# Patient Record
Sex: Male | Born: 1940 | Race: Black or African American | Hispanic: No | Marital: Married | Smoking: Former smoker
Health system: Southern US, Community
[De-identification: ages and names within clinical notes are randomized; demographics above are authoritative.]

## PROBLEM LIST (undated history)

## (undated) DIAGNOSIS — R269 Unspecified abnormalities of gait and mobility: Secondary | ICD-10-CM

## (undated) DIAGNOSIS — E559 Vitamin D deficiency, unspecified: Secondary | ICD-10-CM

## (undated) DIAGNOSIS — T8859XA Other complications of anesthesia, initial encounter: Secondary | ICD-10-CM

## (undated) DIAGNOSIS — N400 Enlarged prostate without lower urinary tract symptoms: Secondary | ICD-10-CM

## (undated) DIAGNOSIS — I480 Paroxysmal atrial fibrillation: Secondary | ICD-10-CM

## (undated) DIAGNOSIS — I10 Essential (primary) hypertension: Secondary | ICD-10-CM

## (undated) DIAGNOSIS — E785 Hyperlipidemia, unspecified: Secondary | ICD-10-CM

## (undated) DIAGNOSIS — Z992 Dependence on renal dialysis: Secondary | ICD-10-CM

## (undated) DIAGNOSIS — R7301 Impaired fasting glucose: Secondary | ICD-10-CM

## (undated) DIAGNOSIS — C61 Malignant neoplasm of prostate: Secondary | ICD-10-CM

## (undated) DIAGNOSIS — K219 Gastro-esophageal reflux disease without esophagitis: Secondary | ICD-10-CM

## (undated) DIAGNOSIS — J309 Allergic rhinitis, unspecified: Secondary | ICD-10-CM

## (undated) DIAGNOSIS — I4891 Unspecified atrial fibrillation: Secondary | ICD-10-CM

## (undated) DIAGNOSIS — E669 Obesity, unspecified: Secondary | ICD-10-CM

## (undated) DIAGNOSIS — H3530 Unspecified macular degeneration: Secondary | ICD-10-CM

## (undated) DIAGNOSIS — H409 Unspecified glaucoma: Secondary | ICD-10-CM

## (undated) DIAGNOSIS — N189 Chronic kidney disease, unspecified: Secondary | ICD-10-CM

## (undated) DIAGNOSIS — I77819 Aortic ectasia, unspecified site: Secondary | ICD-10-CM

## (undated) DIAGNOSIS — E1165 Type 2 diabetes mellitus with hyperglycemia: Secondary | ICD-10-CM

## (undated) DIAGNOSIS — N19 Unspecified kidney failure: Secondary | ICD-10-CM

## (undated) DIAGNOSIS — Z9989 Dependence on other enabling machines and devices: Secondary | ICD-10-CM

## (undated) DIAGNOSIS — H547 Unspecified visual loss: Secondary | ICD-10-CM

## (undated) DIAGNOSIS — I509 Heart failure, unspecified: Secondary | ICD-10-CM

## (undated) DIAGNOSIS — D649 Anemia, unspecified: Secondary | ICD-10-CM

## (undated) DIAGNOSIS — R972 Elevated prostate specific antigen [PSA]: Secondary | ICD-10-CM

## (undated) DIAGNOSIS — M109 Gout, unspecified: Secondary | ICD-10-CM

## (undated) DIAGNOSIS — E119 Type 2 diabetes mellitus without complications: Secondary | ICD-10-CM

## (undated) HISTORY — DX: Obesity, unspecified: E66.9

## (undated) HISTORY — DX: Unspecified visual loss: H54.7

## (undated) HISTORY — DX: Benign prostatic hyperplasia without lower urinary tract symptoms: N40.0

## (undated) HISTORY — DX: Gout, unspecified: M10.9

## (undated) HISTORY — DX: Impaired fasting glucose: R73.01

## (undated) HISTORY — DX: Hyperlipidemia, unspecified: E78.5

## (undated) HISTORY — DX: Type 2 diabetes mellitus with hyperglycemia: E11.65

## (undated) HISTORY — DX: Malignant neoplasm of prostate: C61

## (undated) HISTORY — DX: Allergic rhinitis, unspecified: J30.9

## (undated) HISTORY — DX: Aortic ectasia, unspecified site: I77.819

## (undated) HISTORY — DX: Heart failure, unspecified: I50.9

## (undated) HISTORY — DX: Vitamin D deficiency, unspecified: E55.9

## (undated) HISTORY — DX: Elevated prostate specific antigen (PSA): R97.20

## (undated) HISTORY — DX: Dependence on renal dialysis: Z99.2

## (undated) HISTORY — DX: Paroxysmal atrial fibrillation: I48.0

## (undated) HISTORY — DX: Gastro-esophageal reflux disease without esophagitis: K21.9

## (undated) HISTORY — DX: Unspecified abnormalities of gait and mobility: R26.9

## (undated) HISTORY — PX: EYE SURGERY: SHX253

---

## 2004-06-08 ENCOUNTER — Encounter: Admission: RE | Admit: 2004-06-08 | Discharge: 2004-06-08 | Payer: Self-pay | Admitting: Urology

## 2008-02-06 ENCOUNTER — Emergency Department (HOSPITAL_COMMUNITY): Admission: EM | Admit: 2008-02-06 | Discharge: 2008-02-06 | Payer: Self-pay | Admitting: Emergency Medicine

## 2008-06-18 ENCOUNTER — Encounter: Admission: RE | Admit: 2008-06-18 | Discharge: 2008-09-16 | Payer: Self-pay | Admitting: Ophthalmology

## 2010-03-21 ENCOUNTER — Encounter: Payer: Self-pay | Admitting: Urology

## 2010-12-03 LAB — DIFFERENTIAL
Basophils Relative: 0 % (ref 0–1)
Eosinophils Absolute: 0 10*3/uL (ref 0.0–0.7)
Eosinophils Relative: 0 % (ref 0–5)
Monocytes Relative: 6 % (ref 3–12)
Neutro Abs: 8.8 10*3/uL — ABNORMAL HIGH (ref 1.7–7.7)

## 2010-12-03 LAB — URINALYSIS, ROUTINE W REFLEX MICROSCOPIC
Protein, ur: 100 mg/dL — AB
Urobilinogen, UA: 1 mg/dL (ref 0.0–1.0)

## 2010-12-03 LAB — BASIC METABOLIC PANEL
CO2: 21 mEq/L (ref 19–32)
Calcium: 9.1 mg/dL (ref 8.4–10.5)
Chloride: 101 mEq/L (ref 96–112)
GFR calc Af Amer: >60 mL/min (ref >60)
GFR calc non Af Amer: >60 mL/min (ref >60)
Sodium: 133 mEq/L — ABNORMAL LOW (ref 135–145)

## 2010-12-03 LAB — CBC
HCT: 43.9 % (ref 39.0–52.0)
MCV: 90.1 fL (ref 78.0–100.0)
Platelets: 177 10*3/uL (ref 150–400)
RBC: 4.87 MIL/uL (ref 4.22–5.81)

## 2010-12-03 LAB — URINE MICROSCOPIC-ADD ON

## 2011-01-17 DIAGNOSIS — H401133 Primary open-angle glaucoma, bilateral, severe stage: Secondary | ICD-10-CM | POA: Insufficient documentation

## 2011-01-17 DIAGNOSIS — H2511 Age-related nuclear cataract, right eye: Secondary | ICD-10-CM | POA: Insufficient documentation

## 2011-03-10 DIAGNOSIS — Z961 Presence of intraocular lens: Secondary | ICD-10-CM | POA: Insufficient documentation

## 2011-03-10 DIAGNOSIS — H31012 Macula scars of posterior pole (postinflammatory) (post-traumatic), left eye: Secondary | ICD-10-CM | POA: Insufficient documentation

## 2011-03-10 DIAGNOSIS — H3533 Angioid streaks of macula: Secondary | ICD-10-CM | POA: Insufficient documentation

## 2012-04-11 DIAGNOSIS — C61 Malignant neoplasm of prostate: Secondary | ICD-10-CM | POA: Insufficient documentation

## 2013-05-13 DIAGNOSIS — N138 Other obstructive and reflux uropathy: Secondary | ICD-10-CM | POA: Insufficient documentation

## 2013-05-13 DIAGNOSIS — N401 Enlarged prostate with lower urinary tract symptoms: Secondary | ICD-10-CM

## 2016-05-26 DIAGNOSIS — E119 Type 2 diabetes mellitus without complications: Secondary | ICD-10-CM | POA: Insufficient documentation

## 2017-01-04 ENCOUNTER — Other Ambulatory Visit: Payer: Self-pay

## 2017-01-04 MED ORDER — DUTASTERIDE 0.5 MG PO CAPS: 0.5000 mg | ORAL_CAPSULE | Freq: Every day | ORAL | 0 refills | Status: DC

## 2017-01-04 NOTE — Progress Notes (Signed)
Pt called requesting more advodart until f/u appt with Dr. Bernardo Heater at the end of Nov. 30 days and no refills were given.

## 2017-01-18 ENCOUNTER — Other Ambulatory Visit: Payer: Self-pay

## 2017-01-18 ENCOUNTER — Ambulatory Visit (INDEPENDENT_AMBULATORY_CARE_PROVIDER_SITE_OTHER): Payer: Medicare Other | Admitting: Urology

## 2017-01-18 ENCOUNTER — Encounter: Payer: Self-pay | Admitting: Urology

## 2017-01-18 VITALS — BP 178/134 | HR 71 | Ht 78.0 in | Wt 320.5 lb

## 2017-01-18 DIAGNOSIS — N138 Other obstructive and reflux uropathy: Secondary | ICD-10-CM

## 2017-01-18 DIAGNOSIS — N401 Enlarged prostate with lower urinary tract symptoms: Secondary | ICD-10-CM

## 2017-01-18 DIAGNOSIS — C61 Malignant neoplasm of prostate: Secondary | ICD-10-CM | POA: Diagnosis not present

## 2017-01-18 NOTE — Progress Notes (Signed)
01/18/2017 11:48 AM   Austin Wheeler 27-Apr-1940 [id number]  Referring provider: No referring provider defined for this encounter.  Chief Complaint  Patient presents with  . Prostate Cancer  . Follow-up    HPI: 76 year old male presents for follow-up of prostate cancer and BPH.  I last saw him at [facility] in April 2018.  Prostate biopsy was performed at [facility] in [place] in April 2006 for a PSA of 6.5.  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  Prostate volume was 150 cc.  He could not decide on treatment options and elected active surveillance.  Repeat prostate biopsy November 2010 showed a volume of 198 cc and no cancer.  A prostate MRI performed September 2017 showed a 1.4 cm PIRADS 4 lesion in the transition zone in the right mid gland.  No adenopathy or evidence of extracapsular extension was identified.  He elected to continue active surveillance.  PSA April 2018 was stable at 5.52.  He remains on Avodart every other day and has no bothersome lower urinary tract symptoms.  Denies dysuria or gross hematuria.  Denies flank, abdominal, pelvic or scrotal pain.  He recently saw Dr. Inda Merlin in [place] and his PSA was 4.5.  He states a DRE was also performed that was benign.  He states he called to have those records faxed to the office.   PMH: History reviewed. No pertinent past medical history.  Surgical History: History reviewed. No pertinent surgical history.  Home Medications:  Allergies as of 01/18/2017      Reactions   Guaifenesin Rash, Swelling      Medication List        Accurate as of 01/18/17 11:48 AM. Always use your most recent med list.          acetaZOLAMIDE 250 MG tablet Commonly known as:  DIAMOX Take 250 mg by mouth.   amLODipine 10 MG tablet Commonly known as:  NORVASC Take 10 mg by mouth.   aspirin-acetaminophen-caffeine 761-950-93 MG tablet Commonly known as:  EXCEDRIN  MIGRAINE Take by mouth.   BENICAR HCT 40-12.5 MG tablet Generic drug:  olmesartan-hydrochlorothiazide   brimonidine 0.15 % ophthalmic solution Commonly known as:  ALPHAGAN Place 1 drop into both eyes 3 times daily.   dorzolamide-timolol 22.3-6.8 MG/ML ophthalmic solution Commonly known as:  COSOPT PLACE ON DROP INTO BOTH EYES TWO TIMES DAILY   doxazosin 8 MG tablet Commonly known as:  CARDURA   dutasteride 0.5 MG capsule Commonly known as:  AVODART Take 1 capsule (0.5 mg total) daily by mouth.   FOLIC ACID PO Take by mouth.   Garlic Oil 2 MG Caps Take by mouth.   indomethacin 50 MG capsule Commonly known as:  INDOCIN   latanoprost 0.005 % ophthalmic solution Commonly known as:  XALATAN INSTILL ONE DROP INTO BOTH EYES NIGHTLY   metFORMIN 500 MG tablet Commonly known as:  GLUCOPHAGE Take 500 mg by mouth.   metoprolol succinate 50 MG 24 hr tablet Commonly known as:  TOPROL-XL Take 50 mg by mouth.   metoprolol tartrate 25 MG tablet Commonly known as:  LOPRESSOR Take 25 mg by mouth.   metoprolol tartrate 50 MG tablet Commonly known as:  LOPRESSOR   pilocarpine 2 % ophthalmic solution Commonly known as:  PILOCAR Frequency:QID   Dosage:0.0     Instructions:  Note:Dose: 2 %   PRESERVISION/LUTEIN Caps Take by mouth.   valsartan-hydrochlorothiazide 320-12.5 MG tablet Commonly known  as:  DIOVAN-HCT Take by mouth.       Allergies:  Allergies  Allergen Reactions  . Guaifenesin Rash and Swelling    Family History: History reviewed. No pertinent family history.  Social History:  has no tobacco, alcohol, and drug history on file.  ROS: UROLOGY Frequent Urination?: No Hard to postpone urination?: No Burning/pain with urination?: No Get up at night to urinate?: No Leakage of urine?: No Urine stream starts and stops?: No Trouble starting stream?: No Do you have to strain to urinate?: No Blood in urine?: No Urinary tract infection?: No Sexually  transmitted disease?: No Injury to kidneys or bladder?: No Painful intercourse?: No Weak stream?: No Erection problems?: No Penile pain?: No  Gastrointestinal Nausea?: No Vomiting?: No Indigestion/heartburn?: No Diarrhea?: No Constipation?: No  Constitutional Fever: No Night sweats?: No Weight loss?: No Fatigue?: No  Skin Skin rash/lesions?: No Itching?: No  Eyes Blurred vision?: No Double vision?: No  Ears/Nose/Throat Sore throat?: No Sinus problems?: No  Hematologic/Lymphatic Swollen glands?: No Easy bruising?: No  Cardiovascular Leg swelling?: No Chest pain?: No  Respiratory Cough?: No Shortness of breath?: No  Endocrine Excessive thirst?: No  Musculoskeletal Back pain?: Yes Joint pain?: No  Neurological Headaches?: No Dizziness?: No  Psychologic Depression?: No Anxiety?: No  Physical Exam: BP (!) 178/134 (BP Location: Right Arm, Patient Position: Sitting, Cuff Size: Large)   Pulse 71   Ht 6\' 6"  (1.981 m)   Wt (!) 320 lb 8 oz (145.4 kg)   BMI 37.04 kg/m   Constitutional:  Alert and oriented, No acute distress. HEENT: [place] AT, moist mucus membranes.  Trachea midline, no masses. Cardiovascular: No clubbing, cyanosis, or edema. Respiratory: Normal respiratory effort, no increased work of breathing. GI: Abdomen is soft, nontender, nondistended, no abdominal masses GU: No CVA tenderness.  Skin: No rashes, bruises or suspicious lesions. Lymph: No cervical or inguinal adenopathy. Neurologic: Grossly intact, no focal deficits, moving all 4 extremities. Psychiatric: Normal mood and affect.  Laboratory Data: Lab Results  Component Value Date   WBC 10.7 (H) 02/06/2008   HGB 14.6 02/06/2008   HCT 43.9 02/06/2008   MCV 90.1 02/06/2008   PLT 177 02/06/2008    Lab Results  Component Value Date   CREATININE 1.02 02/06/2008      Assessment & Plan:    1.  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  Continue  semiannual follow-up.  He desires to continue active surveillance.  2. BPH with obstruction/lower urinary tract symptoms Stable on dutasteride and doxazosin.  - Urinalysis, Complete   Return in about 6 months (around 07/18/2017) for Recheck, PSA.    [name], [place] [address] [place],  [address] [phone]

## 2017-03-17 ENCOUNTER — Telehealth: Payer: Self-pay | Admitting: Radiology

## 2017-03-17 ENCOUNTER — Other Ambulatory Visit: Payer: Self-pay | Admitting: Radiology

## 2017-03-17 DIAGNOSIS — N401 Enlarged prostate with lower urinary tract symptoms: Principal | ICD-10-CM

## 2017-03-17 DIAGNOSIS — N138 Other obstructive and reflux uropathy: Secondary | ICD-10-CM

## 2017-03-17 MED ORDER — DUTASTERIDE 0.5 MG PO CAPS: 0.5000 mg | ORAL_CAPSULE | Freq: Every day | ORAL | 3 refills | Status: DC

## 2017-03-17 NOTE — Telephone Encounter (Signed)
Wife requests refill of dutasteride. Prescription sent to pharmacy. Wife voices understanding.

## 2017-07-17 ENCOUNTER — Encounter: Payer: Self-pay | Admitting: Urology

## 2017-07-17 ENCOUNTER — Ambulatory Visit: Payer: Medicare Other | Admitting: Urology

## 2017-07-17 VITALS — BP 175/112 | HR 79 | Ht 78.0 in | Wt 318.0 lb

## 2017-07-17 DIAGNOSIS — C61 Malignant neoplasm of prostate: Secondary | ICD-10-CM | POA: Diagnosis not present

## 2017-07-17 DIAGNOSIS — N138 Other obstructive and reflux uropathy: Secondary | ICD-10-CM | POA: Diagnosis not present

## 2017-07-17 DIAGNOSIS — N401 Enlarged prostate with lower urinary tract symptoms: Secondary | ICD-10-CM | POA: Diagnosis not present

## 2017-07-17 NOTE — Progress Notes (Signed)
07/17/2017 3:14 PM   Austin Wheeler 11-11-1940 [id number]  Referring provider: Josetta Huddle, MD [place]. [address] [place], [place] [address]  Chief Complaint  Patient presents with  . Follow-up   Urologic problem list: -Prostate cancer, moderate risk on active surveillance Prostate biopsy was performed at [facility] in [place] in April 2006 for a PSA of 6.5.  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  Prostate volume was 150 cc.  He could not decide on treatment options and elected active surveillance.  Repeat prostate biopsy November 2010 showed a volume of 198 cc and no cancer.  A prostate MRI performed September 2017 showed a 1.4 cm PIRADS 4 lesion in the transition zone in the right mid gland.  No adenopathy or evidence of extracapsular extension was identified.  He elected to continue active surveillance.   -BPH with lower urinary tract symptoms; on doxazosin and dutasteride   HPI: 77 year old male presents for follow-up of the above problem list.  He was last seen November 2018.  He states he is doing very well from a voiding standpoint and has no complaints.  He remains on dutasteride 3 times weekly and doxazosin.  And uncorrected PSA performed at his primary providers office in early May was stable at 3.8.  He denies dysuria or gross hematuria.  He has no flank, abdominal, pelvic or scrotal pain.   PMH: History reviewed. No pertinent past medical history.  Surgical History: History reviewed. No pertinent surgical history.  Home Medications:  Allergies as of 07/17/2017      Reactions   Guaifenesin Rash, Swelling      Medication List        Accurate as of 07/17/17  3:14 PM. Always use your most recent med list.          acetaZOLAMIDE 250 MG tablet Commonly known as:  DIAMOX Take 250 mg by mouth.   amLODipine 10 MG tablet Commonly known as:  NORVASC Take 10 mg by mouth.   aspirin EC  81 MG tablet 81 mg daily.   aspirin-acetaminophen-caffeine 726-203-55 MG tablet Commonly known as:  EXCEDRIN MIGRAINE Take by mouth.   BENICAR HCT 40-12.5 MG tablet Generic drug:  olmesartan-hydrochlorothiazide   brimonidine 0.15 % ophthalmic solution Commonly known as:  ALPHAGAN Place 1 drop into both eyes 3 times daily.   ciprofloxacin 0.3 % ophthalmic solution Commonly known as:  CILOXAN   dorzolamide-timolol 22.3-6.8 MG/ML ophthalmic solution Commonly known as:  COSOPT PLACE ON DROP INTO BOTH EYES TWO TIMES DAILY   doxazosin 8 MG tablet Commonly known as:  CARDURA   dutasteride 0.5 MG capsule Commonly known as:  AVODART Take 1 capsule (0.5 mg total) by mouth daily.   FOLIC ACID PO Take by mouth.   Garlic Oil 2 MG Caps Take by mouth.   indomethacin 50 MG capsule Commonly known as:  INDOCIN   latanoprost 0.005 % ophthalmic solution Commonly known as:  XALATAN INSTILL ONE DROP INTO BOTH EYES NIGHTLY   XALATAN 0.005 % ophthalmic solution Generic drug:  latanoprost Place 1 drop into the right eye nightly.   metFORMIN 500 MG tablet Commonly known as:  GLUCOPHAGE Take 500 mg by mouth.   metoprolol succinate 50 MG 24 hr tablet Commonly known as:  TOPROL-XL Take 50 mg by mouth.   metoprolol tartrate 25 MG tablet Commonly known as:  LOPRESSOR Take 25 mg by mouth.   metoprolol tartrate 50  MG tablet Commonly known as:  LOPRESSOR   pilocarpine 2 % ophthalmic solution Commonly known as:  PILOCAR Frequency:QID   Dosage:0.0     Instructions:  Note:Dose: 2 %   pilocarpine 4 % ophthalmic solution Commonly known as:  PILOCAR   CENTRUM ADULTS PO daily. one by mouth once a day (men's ultra formula)   PRESERVISION/LUTEIN Caps Take by mouth.   valsartan-hydrochlorothiazide 320-12.5 MG tablet Commonly known as:  DIOVAN-HCT Take by mouth.   Vitamin D3 2000 units capsule 1 capsule daily.   Vitamin E 400 units Tabs 400 Units daily.       Allergies:    Allergies  Allergen Reactions  . Guaifenesin Rash and Swelling    Family History: History reviewed. No pertinent family history.  Social History:  does not have a smoking history on file. He has never used smokeless tobacco. He reports that he drank alcohol. He reports that he does not use drugs.  ROS: UROLOGY Frequent Urination?: No Hard to postpone urination?: No Burning/pain with urination?: No Get up at night to urinate?: No Leakage of urine?: No Urine stream starts and stops?: No Trouble starting stream?: No Do you have to strain to urinate?: No Blood in urine?: No Urinary tract infection?: No Sexually transmitted disease?: No Injury to kidneys or bladder?: No Painful intercourse?: No Weak stream?: No Erection problems?: No Penile pain?: No  Gastrointestinal Nausea?: No Vomiting?: No Indigestion/heartburn?: No Diarrhea?: No Constipation?: No  Constitutional Fever: No Night sweats?: No Weight loss?: No Fatigue?: No  Skin Skin rash/lesions?: No Itching?: No  Eyes Blurred vision?: No Double vision?: No  Ears/Nose/Throat Sore throat?: No Sinus problems?: No  Hematologic/Lymphatic Swollen glands?: No Easy bruising?: No  Cardiovascular Leg swelling?: No Chest pain?: No  Respiratory Cough?: No Shortness of breath?: No  Endocrine Excessive thirst?: No  Musculoskeletal Back pain?: No Joint pain?: No  Neurological Headaches?: No Dizziness?: No  Psychologic Depression?: No Anxiety?: No  Physical Exam: BP (!) 175/112 (BP Location: Right Arm, Patient Position: Sitting, Cuff Size: Large)   Pulse 79   Ht 6\' 6"  (1.981 m)   Wt (!) 318 lb (144.2 kg)   SpO2 99%   BMI 36.75 kg/m   Constitutional:  Alert and oriented, No acute distress. HEENT: [place] AT, moist mucus membranes.  Trachea midline, no masses. Cardiovascular: No clubbing, cyanosis, or edema. Respiratory: Normal respiratory effort, no increased work of breathing. GI: Abdomen is  soft, nontender, nondistended, no abdominal masses GU: No CVA tenderness.  Prostate 60+ cc, smooth without nodules Lymph: No cervical or inguinal lymphadenopathy. Skin: No rashes, bruises or suspicious lesions. Neurologic: Grossly intact, no focal deficits, moving all 4 extremities. Psychiatric: Normal mood and affect.  Laboratory Data: Lab Results  Component Value Date   WBC 10.7 (H) 02/06/2008   HGB 14.6 02/06/2008   HCT 43.9 02/06/2008   MCV 90.1 02/06/2008   PLT 177 02/06/2008    Lab Results  Component Value Date   CREATININE 1.02 02/06/2008     Assessment & Plan:   77 year old male with moderate risk prostate cancer who has been on surveillance for approximately 13 years.  His PSA is stable.  He has elected to continue active surveillance and recommend a follow-up PSA in 6 months and office visit 1 year.    [name], [place] [address] [place], [place] [address] [phone]

## 2017-07-17 NOTE — Addendum Note (Signed)
Addended by: Tommy Rainwater on: 07/17/2017 04:41 PM   Modules accepted: Orders

## 2017-08-01 DIAGNOSIS — H4051X3 Glaucoma secondary to other eye disorders, right eye, severe stage: Secondary | ICD-10-CM | POA: Insufficient documentation

## 2018-01-15 ENCOUNTER — Other Ambulatory Visit: Payer: Medicare Other

## 2018-01-15 DIAGNOSIS — N401 Enlarged prostate with lower urinary tract symptoms: Principal | ICD-10-CM

## 2018-01-15 DIAGNOSIS — N138 Other obstructive and reflux uropathy: Secondary | ICD-10-CM

## 2018-01-16 ENCOUNTER — Telehealth: Payer: Self-pay

## 2018-01-16 LAB — PSA: PROSTATE SPECIFIC AG, SERUM: 5.8 ng/mL — AB (ref 0.0–4.0)

## 2018-01-16 NOTE — Telephone Encounter (Signed)
-----   Message from [name], MD sent at 01/16/2018  7:27 AM EST ----- PSA stable at 5.8.  Follow-up as scheduled

## 2018-01-16 NOTE — Telephone Encounter (Signed)
Informed patient of PSA results and recommendation.

## 2018-05-04 ENCOUNTER — Other Ambulatory Visit: Payer: Self-pay | Admitting: Urology

## 2018-05-04 DIAGNOSIS — N138 Other obstructive and reflux uropathy: Secondary | ICD-10-CM

## 2018-05-04 DIAGNOSIS — N401 Enlarged prostate with lower urinary tract symptoms: Principal | ICD-10-CM

## 2018-05-04 MED ORDER — DUTASTERIDE 0.5 MG PO CAPS: 0.5000 mg | ORAL_CAPSULE | Freq: Every day | ORAL | 3 refills | Status: DC

## 2018-05-04 NOTE — Telephone Encounter (Signed)
Pt needs refill of Avodart sent to pharmacy.

## 2018-05-04 NOTE — Telephone Encounter (Signed)
Dutasteride refill sent to pharmacy per patient request

## 2018-07-17 ENCOUNTER — Other Ambulatory Visit: Payer: Self-pay

## 2018-07-17 DIAGNOSIS — C61 Malignant neoplasm of prostate: Secondary | ICD-10-CM

## 2018-07-18 ENCOUNTER — Encounter: Payer: Self-pay | Admitting: Urology

## 2018-07-18 ENCOUNTER — Other Ambulatory Visit: Payer: Medicare Other

## 2018-07-30 ENCOUNTER — Ambulatory Visit: Payer: Medicare Other | Admitting: Urology

## 2018-07-30 ENCOUNTER — Encounter: Payer: Self-pay | Admitting: Urology

## 2018-08-08 DIAGNOSIS — H2511 Age-related nuclear cataract, right eye: Secondary | ICD-10-CM | POA: Insufficient documentation

## 2018-08-29 ENCOUNTER — Other Ambulatory Visit: Payer: Self-pay

## 2018-10-24 ENCOUNTER — Other Ambulatory Visit: Payer: Medicare Other

## 2018-11-01 ENCOUNTER — Encounter: Payer: Self-pay | Admitting: Urology

## 2018-11-01 ENCOUNTER — Ambulatory Visit (INDEPENDENT_AMBULATORY_CARE_PROVIDER_SITE_OTHER): Payer: Medicare Other | Admitting: Urology

## 2018-11-01 ENCOUNTER — Other Ambulatory Visit: Payer: Self-pay

## 2018-11-01 VITALS — BP 159/107 | HR 73 | Ht 78.0 in | Wt 299.0 lb

## 2018-11-01 DIAGNOSIS — N138 Other obstructive and reflux uropathy: Secondary | ICD-10-CM

## 2018-11-01 DIAGNOSIS — N401 Enlarged prostate with lower urinary tract symptoms: Secondary | ICD-10-CM | POA: Diagnosis not present

## 2018-11-01 DIAGNOSIS — C61 Malignant neoplasm of prostate: Secondary | ICD-10-CM | POA: Diagnosis not present

## 2018-11-02 ENCOUNTER — Encounter: Payer: Self-pay | Admitting: Urology

## 2018-11-02 MED ORDER — DOXAZOSIN MESYLATE 8 MG PO TABS: 8.0000 mg | ORAL_TABLET | Freq: Every day | ORAL | 3 refills | Status: DC

## 2018-11-02 NOTE — Progress Notes (Signed)
11/01/2018 11:48 AM   Austin Wheeler 07-12-1940 [id number]  Referring provider: Josetta Huddle, MD [address] [place],  [place] [address]  Chief Complaint  Patient presents with  . Follow-up    Urologic problem list: 1. Prostate cancer, moderate risk on active surveillance  - Prostate biopsy was performed at [facility] in [place] in April 2006 for a PSA of 6.5.  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  -  - Prostate volume 150 cc; could not decide on treatment options and elected active surveillance.  -  - Repeat prostate biopsy November 2010 showed a volume of 198 ccand no cancer.    - prostate MRI performed September 2017 showed a 1.4 cm PIRADS 4 lesion in the transition zone in the right mid gland. No adenopathy or evidence of extracapsular extension was identified.  -elected to continue active surveillance.  2. BPH with lower urinary tract symptoms; on doxazosin and dutasteride  HPI: 78 y.o. male presents for annual follow-up.  He has no bothersome lower urinary tract symptoms.  He remains on doxazosin/dutasteride.  Denies dysuria or gross hematuria.  PSA drawn by his PCP June 2020 was stable at 6.66.  IPSS completed today was 2/1.   PMH: No past medical history on file.  Surgical History: No past surgical history on file.  Home Medications:  Allergies as of 11/01/2018      Reactions   Guaifenesin Rash, Swelling      Medication List       Accurate as of November 01, 2018 11:59 PM. If you have any questions, ask your nurse or doctor.        acetaZOLAMIDE 250 MG tablet Commonly known as: DIAMOX Take 250 mg by mouth.   amLODipine 10 MG tablet Commonly known as: NORVASC Take 10 mg by mouth.   aspirin EC 81 MG tablet 81 mg daily.   aspirin-acetaminophen-caffeine 062-694-85 MG tablet Commonly known as: EXCEDRIN MIGRAINE Take by mouth.   Benicar HCT 40-12.5 MG tablet Generic  drug: olmesartan-hydrochlorothiazide   brimonidine 0.15 % ophthalmic solution Commonly known as: ALPHAGAN Place 1 drop into both eyes 3 times daily.   ciprofloxacin 0.3 % ophthalmic solution Commonly known as: CILOXAN   dorzolamide-timolol 22.3-6.8 MG/ML ophthalmic solution Commonly known as: COSOPT PLACE ON DROP INTO BOTH EYES TWO TIMES DAILY   doxazosin 8 MG tablet Commonly known as: CARDURA   dutasteride 0.5 MG capsule Commonly known as: AVODART Take 1 capsule (0.5 mg total) by mouth daily.   FOLIC ACID PO Take by mouth.   Garlic Oil 2 MG Caps Take by mouth.   indomethacin 50 MG capsule Commonly known as: INDOCIN   metFORMIN 500 MG tablet Commonly known as: GLUCOPHAGE Take 500 mg by mouth.   metoprolol succinate 50 MG 24 hr tablet Commonly known as: TOPROL-XL Take 50 mg by mouth.   metoprolol tartrate 25 MG tablet Commonly known as: LOPRESSOR Take 25 mg by mouth.   metoprolol tartrate 50 MG tablet Commonly known as: LOPRESSOR   pilocarpine 2 % ophthalmic solution Commonly known as: PILOCAR Frequency:QID   Dosage:0.0     Instructions:  Note:Dose: 2 %   pilocarpine 4 % ophthalmic solution Commonly known as: PILOCAR   CENTRUM ADULTS PO daily. one by mouth once a day (men's ultra formula)   PreserVision/Lutein Caps Take by mouth.   valsartan-hydrochlorothiazide 320-12.5 MG tablet Commonly known as: DIOVAN-HCT Take by mouth.   Vitamin D3  50 MCG (2000 UT) capsule 1 capsule daily.   Vitamin E 400 units Tabs 400 Units daily.   Xalatan 0.005 % ophthalmic solution Generic drug: latanoprost Place 1 drop into the right eye nightly. What changed: Another medication with the same name was removed. Continue taking this medication, and follow the directions you see here. Changed by: [name], MD       Allergies:  Allergies  Allergen Reactions  . Guaifenesin Rash and Swelling    Family History: No family history on file.  Social History:   reports that he has never smoked. He has never used smokeless tobacco. He reports previous alcohol use. He reports that he does not use drugs.  ROS: UROLOGY Frequent Urination?: No Hard to postpone urination?: No Burning/pain with urination?: No Get up at night to urinate?: Yes Leakage of urine?: No Urine stream starts and stops?: No Trouble starting stream?: No Do you have to strain to urinate?: No Blood in urine?: No Urinary tract infection?: No Sexually transmitted disease?: No Injury to kidneys or bladder?: No Painful intercourse?: No Weak stream?: No Erection problems?: No Penile pain?: No  Gastrointestinal Nausea?: No Vomiting?: No Indigestion/heartburn?: No Diarrhea?: No Constipation?: No  Constitutional Fever: No Night sweats?: No Weight loss?: No Fatigue?: No  Skin Skin rash/lesions?: No Itching?: No  Eyes Blurred vision?: No Double vision?: No  Ears/Nose/Throat Sore throat?: No Sinus problems?: No  Hematologic/Lymphatic Swollen glands?: No Easy bruising?: No  Cardiovascular Leg swelling?: No Chest pain?: No  Respiratory Cough?: No Shortness of breath?: No  Endocrine Excessive thirst?: No  Musculoskeletal Back pain?: No Joint pain?: No  Neurological Headaches?: No Dizziness?: No  Psychologic Depression?: No Anxiety?: No  Physical Exam: BP (!) 159/107   Pulse 73   Ht 6\' 6"  (1.981 m)   Wt 299 lb (135.6 kg)   BMI 34.55 kg/m   Constitutional:  Alert and oriented, No acute distress. HEENT: [place] AT, moist mucus membranes.  Trachea midline, no masses. Cardiovascular: No clubbing, cyanosis, or edema. Respiratory: Normal respiratory effort, no increased work of breathing. GI: Abdomen is soft, nontender, nondistended, no abdominal masses GU: No CVA tenderness.  Prostate 60+ cc, smooth without nodules Lymph: No cervical or inguinal lymphadenopathy. Skin: No rashes, bruises or suspicious lesions. Neurologic: Grossly intact, no focal  deficits, moving all 4 extremities. Psychiatric: Normal mood and affect.   Assessment & Plan:    - Intermediate risk prostate cancer He has elected to continue active surveillance.  PSA stable and DRE benign.  Recommend a 53-month follow-up PSA and 1 year office visit.  - BPH with lower urinary tract symptoms Stable voiding symptoms on dutasteride/doxazosin which were refilled.   [name], [place] [address] [place], [place] [address] [phone]

## 2019-05-01 ENCOUNTER — Other Ambulatory Visit: Payer: Medicare Other

## 2019-05-08 ENCOUNTER — Other Ambulatory Visit: Payer: Self-pay

## 2019-05-08 DIAGNOSIS — C61 Malignant neoplasm of prostate: Secondary | ICD-10-CM

## 2019-05-08 DIAGNOSIS — N138 Other obstructive and reflux uropathy: Secondary | ICD-10-CM

## 2019-05-09 ENCOUNTER — Other Ambulatory Visit: Payer: Self-pay

## 2019-05-09 ENCOUNTER — Other Ambulatory Visit: Payer: Medicare PPO

## 2019-05-09 DIAGNOSIS — C61 Malignant neoplasm of prostate: Secondary | ICD-10-CM

## 2019-05-10 LAB — PSA: Prostate Specific Ag, Serum: 4.5 ng/mL — ABNORMAL HIGH (ref 0.0–4.0)

## 2019-10-08 DIAGNOSIS — E785 Hyperlipidemia, unspecified: Secondary | ICD-10-CM | POA: Diagnosis not present

## 2019-10-08 DIAGNOSIS — E559 Vitamin D deficiency, unspecified: Secondary | ICD-10-CM | POA: Diagnosis not present

## 2019-10-08 DIAGNOSIS — K219 Gastro-esophageal reflux disease without esophagitis: Secondary | ICD-10-CM | POA: Diagnosis not present

## 2019-10-08 DIAGNOSIS — C61 Malignant neoplasm of prostate: Secondary | ICD-10-CM | POA: Diagnosis not present

## 2019-10-08 DIAGNOSIS — Z7984 Long term (current) use of oral hypoglycemic drugs: Secondary | ICD-10-CM | POA: Diagnosis not present

## 2019-10-08 DIAGNOSIS — H4050X0 Glaucoma secondary to other eye disorders, unspecified eye, stage unspecified: Secondary | ICD-10-CM | POA: Diagnosis not present

## 2019-10-08 DIAGNOSIS — L309 Dermatitis, unspecified: Secondary | ICD-10-CM | POA: Diagnosis not present

## 2019-10-08 DIAGNOSIS — H543 Unqualified visual loss, both eyes: Secondary | ICD-10-CM | POA: Diagnosis not present

## 2019-10-11 DIAGNOSIS — I8312 Varicose veins of left lower extremity with inflammation: Secondary | ICD-10-CM | POA: Diagnosis not present

## 2019-10-11 DIAGNOSIS — I872 Venous insufficiency (chronic) (peripheral): Secondary | ICD-10-CM | POA: Diagnosis not present

## 2019-10-11 DIAGNOSIS — I8311 Varicose veins of right lower extremity with inflammation: Secondary | ICD-10-CM | POA: Diagnosis not present

## 2019-11-14 ENCOUNTER — Ambulatory Visit: Payer: Medicare Other | Admitting: Urology

## 2019-11-14 ENCOUNTER — Ambulatory Visit: Payer: Medicare PPO | Admitting: Urology

## 2019-11-15 ENCOUNTER — Encounter: Payer: Self-pay | Admitting: Urology

## 2019-11-15 ENCOUNTER — Ambulatory Visit: Payer: Medicare PPO | Admitting: Urology

## 2019-11-15 ENCOUNTER — Other Ambulatory Visit: Payer: Self-pay

## 2019-11-15 VITALS — BP 129/83 | HR 79 | Ht 78.0 in | Wt 295.0 lb

## 2019-11-15 DIAGNOSIS — C61 Malignant neoplasm of prostate: Secondary | ICD-10-CM

## 2019-11-15 DIAGNOSIS — N401 Enlarged prostate with lower urinary tract symptoms: Secondary | ICD-10-CM | POA: Diagnosis not present

## 2019-11-15 DIAGNOSIS — N138 Other obstructive and reflux uropathy: Secondary | ICD-10-CM | POA: Diagnosis not present

## 2019-11-15 MED ORDER — DUTASTERIDE 0.5 MG PO CAPS: 0.5000 mg | ORAL_CAPSULE | Freq: Every day | ORAL | 3 refills | Status: DC

## 2019-11-15 MED ORDER — DOXAZOSIN MESYLATE 8 MG PO TABS: 8.0000 mg | ORAL_TABLET | Freq: Every day | ORAL | 3 refills | Status: DC

## 2019-11-15 NOTE — Progress Notes (Signed)
11/15/2019 8:57 AM   Austin Wheeler 1940/09/03 [id number]  Referring provider: Josetta Huddle, MD [address] [place] [place] [address] [place],  [place] [address]  Chief Complaint  Patient presents with  . Prostate Cancer    Urologic problem list: 1. Prostate cancer, moderate risk on active surveillance - Prostate biopsy was performed at [facility] in [place] in April 2006 for a PSA of 6.5.        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                        - Prostate volume 150 cc; could not decide on treatment options and elected active surveillance.                    - Repeat prostate biopsy November 2010 showed a volume of 198 ccand no cancer.   - prostate MRI performed September 2017 showed a 1.4 cm PIRADS 4 lesion in the transition zone in the right mid gland. No adenopathy or evidence of extracapsular extension was identified.        -elected to continue active surveillance.  2. BPH with lower urinary tract symptoms -doxazosin and dutasteride   HPI: 79 y.o. male presents for annual follow-up.   Denies bothersome LUTS  Remains on doxazosin/dutasteride  Denies dysuria, gross hematuria  No flank, abdominal or pelvic pain   PMH: History reviewed. No pertinent past medical history.  Surgical History: History reviewed. No pertinent surgical history.  Home Medications:  Allergies as of 11/15/2019      Reactions   Guaifenesin Rash, Swelling      Medication List       Accurate as of November 15, 2019  8:57 AM. If you have any questions, ask your nurse or doctor.        acetaZOLAMIDE 250 MG tablet Commonly known as: DIAMOX Take 250 mg by mouth.   amLODipine 10 MG tablet Commonly known as: NORVASC Take 10 mg by mouth.   aspirin EC 81 MG tablet 81 mg daily.   aspirin-acetaminophen-caffeine 009-233-00 MG tablet Commonly known as: EXCEDRIN MIGRAINE Take by mouth.   atropine 1 %  ophthalmic solution   Benicar HCT 40-12.5 MG tablet Generic drug: olmesartan-hydrochlorothiazide   brimonidine 0.15 % ophthalmic solution Commonly known as: ALPHAGAN Place 1 drop into both eyes 3 times daily.   ciprofloxacin 0.3 % ophthalmic solution Commonly known as: CILOXAN   dorzolamide-timolol 22.3-6.8 MG/ML ophthalmic solution Commonly known as: COSOPT PLACE ON DROP INTO BOTH EYES TWO TIMES DAILY   doxazosin 8 MG tablet Commonly known as: CARDURA Take 1 tablet (8 mg total) by mouth daily.   dutasteride 0.5 MG capsule Commonly known as: AVODART Take 1 capsule (0.5 mg total) by mouth daily.   FOLIC ACID PO Take by mouth.   Garlic Oil 2 MG Caps Take by mouth.   indomethacin 50 MG capsule Commonly known as: INDOCIN   metFORMIN 500 MG tablet Commonly known as: GLUCOPHAGE Take 500 mg by mouth.   metoprolol succinate 50 MG 24 hr tablet Commonly known as: TOPROL-XL Take 50 mg by mouth.   metoprolol tartrate 25 MG tablet Commonly known as: LOPRESSOR Take 25 mg by mouth.   metoprolol tartrate 50 MG tablet Commonly known as: LOPRESSOR   pilocarpine 2 % ophthalmic solution Commonly known as: PILOCAR Frequency:QID   Dosage:0.0     Instructions:  Note:Dose: 2 %  pilocarpine 4 % ophthalmic solution Commonly known as: PILOCAR   prednisoLONE acetate 1 % ophthalmic suspension Commonly known as: PRED FORTE INSTILL 1 DROP INTO RIGHT EYE ONCE DAILY   CENTRUM ADULTS PO daily. one by mouth once a day (men's ultra formula)   PreserVision/Lutein Caps Take by mouth.   valsartan-hydrochlorothiazide 320-12.5 MG tablet Commonly known as: DIOVAN-HCT Take by mouth.   Vitamin D3 50 MCG (2000 UT) capsule 1 capsule daily.   Vitamin E 400 units Tabs 400 Units daily.   Xalatan 0.005 % ophthalmic solution Generic drug: latanoprost Place 1 drop into the right eye nightly.       Allergies:  Allergies  Allergen Reactions  . Guaifenesin Rash and Swelling     Family History: History reviewed. No pertinent family history.  Social History:  reports that he has never smoked. He has never used smokeless tobacco. He reports previous alcohol use. He reports that he does not use drugs.   Physical Exam: BP 129/83   Pulse 79   Ht 6\' 6"  (1.981 m)   Wt 295 lb (133.8 kg)   BMI 34.09 kg/m   Constitutional:  Alert and oriented, No acute distress. HEENT: [place] AT, moist mucus membranes.  Trachea midline, no masses. Cardiovascular: No clubbing, cyanosis, or edema. Respiratory: Normal respiratory effort, no increased work of breathing. GI: Abdomen is soft, nontender, nondistended, no abdominal masses GU: Prostate 60+ cc, smooth without nodules Skin: No rashes, bruises or suspicious lesions. Neurologic: Grossly intact, no focal deficits, moving all 4 extremities. Psychiatric: Normal mood and affect.   Assessment & Plan:    1.  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  BPH with LUTS  Stable on doxazosin/dutasteride  Refill sent   Austin Wheeler, Austin Wheeler [address], [place], [place] [address] [phone]

## 2019-11-16 LAB — PSA: Prostate Specific Ag, Serum: 3.3 ng/mL (ref 0.0–4.0)

## 2019-11-18 ENCOUNTER — Telehealth: Payer: Self-pay | Admitting: *Deleted

## 2019-11-18 NOTE — Telephone Encounter (Signed)
Notified patient as instructed, patient pleased. Discussed follow-up appointments, patient agrees  

## 2019-11-18 NOTE — Telephone Encounter (Signed)
-----   Message from [name], MD sent at 11/16/2019 12:52 PM EDT ----- PSA stable 3.3

## 2019-11-22 DIAGNOSIS — I8311 Varicose veins of right lower extremity with inflammation: Secondary | ICD-10-CM | POA: Diagnosis not present

## 2019-11-22 DIAGNOSIS — I872 Venous insufficiency (chronic) (peripheral): Secondary | ICD-10-CM | POA: Diagnosis not present

## 2019-11-22 DIAGNOSIS — I8312 Varicose veins of left lower extremity with inflammation: Secondary | ICD-10-CM | POA: Diagnosis not present

## 2020-01-29 DIAGNOSIS — Z23 Encounter for immunization: Secondary | ICD-10-CM | POA: Diagnosis not present

## 2020-02-03 DIAGNOSIS — H44521 Atrophy of globe, right eye: Secondary | ICD-10-CM | POA: Diagnosis not present

## 2020-02-03 DIAGNOSIS — H401133 Primary open-angle glaucoma, bilateral, severe stage: Secondary | ICD-10-CM | POA: Diagnosis not present

## 2020-02-03 DIAGNOSIS — H4051X3 Glaucoma secondary to other eye disorders, right eye, severe stage: Secondary | ICD-10-CM | POA: Diagnosis not present

## 2020-02-13 DIAGNOSIS — Z961 Presence of intraocular lens: Secondary | ICD-10-CM | POA: Diagnosis not present

## 2020-02-13 DIAGNOSIS — H4051X3 Glaucoma secondary to other eye disorders, right eye, severe stage: Secondary | ICD-10-CM | POA: Diagnosis not present

## 2020-02-13 DIAGNOSIS — H31012 Macula scars of posterior pole (postinflammatory) (post-traumatic), left eye: Secondary | ICD-10-CM | POA: Diagnosis not present

## 2020-02-13 DIAGNOSIS — H401133 Primary open-angle glaucoma, bilateral, severe stage: Secondary | ICD-10-CM | POA: Diagnosis not present

## 2020-02-13 DIAGNOSIS — H2511 Age-related nuclear cataract, right eye: Secondary | ICD-10-CM | POA: Diagnosis not present

## 2020-05-04 ENCOUNTER — Telehealth: Payer: Self-pay

## 2020-05-04 NOTE — Telephone Encounter (Signed)
Patient called stating that he is having swelling in his testicles and penis for about the last 2 weeks. He is not having much pain with the swelling and states it is not red or tender to the touch. He and his wife state that he saw dermatology in 09/2019 and he was given triamcinolone ointment and compression stockings for a rash on his legs. He is wondering if this could be what is now wrong with his scrotum and penis. He was told he would need an appointment for further evaluation. Patient states he has transportation issues and will call back when he is able to arrange for an appointment

## 2020-05-08 NOTE — Telephone Encounter (Signed)
Called pt he states that symptoms are no worse, declines appt for today in office due to transportation. Pt scheduled for next available. Pt advised to go to urgent care or ED for worsening symptoms. Pt voiced understanding.

## 2020-05-08 NOTE — Telephone Encounter (Signed)
Called patient to follow up to see if he was able to arrange transportation for an appointment. No answer left a message to call back and schedule an appointment with the office

## 2020-05-13 ENCOUNTER — Other Ambulatory Visit: Payer: Self-pay

## 2020-05-13 ENCOUNTER — Encounter: Payer: Self-pay | Admitting: Urology

## 2020-05-13 ENCOUNTER — Ambulatory Visit: Payer: Medicare PPO | Admitting: Urology

## 2020-05-13 VITALS — BP 133/84 | HR 81 | Ht 78.0 in | Wt 295.0 lb

## 2020-05-13 DIAGNOSIS — N4889 Other specified disorders of penis: Secondary | ICD-10-CM

## 2020-05-13 DIAGNOSIS — N5089 Other specified disorders of the male genital organs: Secondary | ICD-10-CM | POA: Diagnosis not present

## 2020-05-13 DIAGNOSIS — N401 Enlarged prostate with lower urinary tract symptoms: Secondary | ICD-10-CM

## 2020-05-13 NOTE — Progress Notes (Signed)
05/13/2020 9:09 AM   Austin Wheeler December 02, 1940 [id number]  Referring provider: Josetta Huddle, MD [address] [place] [address] [place],  [place] [address]  Chief Complaint  Patient presents with  . Groin Swelling    Urologic problem list: 1.Prostate cancer, moderate risk on active surveillance -Prostate biopsy was performed at [facility] in [place] in April 2006 for a PSA of 6.5. 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   - Prostate volume 150 cc;could not decide on treatment options and elected active surveillance.   - Repeat prostate biopsy November 2010 showed a volume of 198 ccand no cancer.   -prostate MRI performed September 2017 showed a 1.4 cm PIRADS 4 lesion in the transition zone in the right mid gland. No adenopathy or evidence of extracapsular extension was identified. -elected to continue active surveillance.  2.BPH with lower urinary tract symptoms -doxazosin and dutasteride   HPI: 80 y.o. male called for an acute visit for penile and scrotal swelling.   States he saw dermatology recently for lower extremity swelling and was treated with compression stockings.  Subsequently developed penile and groin swelling  No pain or discomfort  No bothersome LUTS  Has an appointment next week with his PCP   PMH: History reviewed. No pertinent past medical history.  Surgical History: History reviewed. No pertinent surgical history.  Home Medications:  Allergies as of 05/13/2020      Reactions   Guaifenesin Rash, Swelling   Influenza Vaccines    Other reaction(s): flulike illness      Medication List       Accurate as of May 13, 2020  9:09 AM. If you have any questions, ask your nurse or doctor.        acetaZOLAMIDE 250 MG tablet Commonly known as: DIAMOX Take 250 mg by mouth.   amLODipine 10 MG tablet Commonly known as:  NORVASC Take 10 mg by mouth.   aspirin EC 81 MG tablet 81 mg daily.   aspirin-acetaminophen-caffeine 381-017-51 MG tablet Commonly known as: EXCEDRIN MIGRAINE Take by mouth.   atorvastatin 10 MG tablet Commonly known as: LIPITOR 1 tablet   atropine 1 % ophthalmic solution   brimonidine 0.15 % ophthalmic solution Commonly known as: ALPHAGAN Place 1 drop into both eyes 3 times daily.   ciprofloxacin 0.3 % ophthalmic solution Commonly known as: CILOXAN   colchicine 0.6 MG tablet one tab   dorzolamide-timolol 22.3-6.8 MG/ML ophthalmic solution Commonly known as: COSOPT PLACE ON DROP INTO BOTH EYES TWO TIMES DAILY   doxazosin 8 MG tablet Commonly known as: CARDURA Take 1 tablet (8 mg total) by mouth daily.   dutasteride 0.5 MG capsule Commonly known as: AVODART Take 1 capsule (0.5 mg total) by mouth daily.   FOLIC ACID PO Take by mouth.   Garlic Oil 2 MG Caps Take by mouth.   indomethacin 50 MG capsule Commonly known as: INDOCIN   latanoprost 0.005 % ophthalmic solution Commonly known as: XALATAN Place 1 drop into the right eye nightly.   magnesium citrate Soln 150 ml   metFORMIN 500 MG tablet Commonly known as: GLUCOPHAGE Take 500 mg by mouth.   metoprolol succinate 50 MG 24 hr tablet Commonly known as: TOPROL-XL Take 50 mg by mouth.   metoprolol tartrate 25 MG tablet Commonly known as: LOPRESSOR Take 25 mg by mouth.   metoprolol tartrate 50 MG tablet Commonly known as: LOPRESSOR   olmesartan-hydrochlorothiazide 40-12.5 MG tablet Commonly known as:  BENICAR HCT   pilocarpine 2 % ophthalmic solution Commonly known as: PILOCAR Frequency:QID   Dosage:0.0     Instructions:  Note:Dose: 2 %   pilocarpine 4 % ophthalmic solution Commonly known as: PILOCAR   prednisoLONE acetate 1 % ophthalmic suspension Commonly known as: PRED FORTE INSTILL 1 DROP INTO RIGHT EYE ONCE DAILY   CENTRUM ADULTS PO daily. one by mouth once a day (men's ultra  formula)   PreserVision/Lutein Caps Take by mouth.   valsartan-hydrochlorothiazide 320-12.5 MG tablet Commonly known as: DIOVAN-HCT Take by mouth.   Vitamin D3 50 MCG (2000 UT) capsule 1 capsule daily.   Vitamin E 400 units Tabs 400 Units daily.       Allergies:  Allergies  Allergen Reactions  . Guaifenesin Rash and Swelling  . Influenza Vaccines     Other reaction(s): flulike illness    Family History: History reviewed. No pertinent family history.  Social History:  reports that he has never smoked. He has never used smokeless tobacco. He reports previous alcohol use. He reports that he does not use drugs.   Physical Exam: BP 133/84   Pulse 81   Ht 6\' 6"  (1.981 m)   Wt 295 lb (133.8 kg)   BMI 34.09 kg/m   Constitutional:  Alert and oriented, No acute distress. HEENT: [place] AT, moist mucus membranes.  Trachea midline, no masses. Cardiovascular: No clubbing, cyanosis, or edema. Respiratory: Normal respiratory effort, no increased work of breathing. GI: Lower abdomen edematous with tissue fluid GU: Mild penile/scrotal edema.  No erythema, tenderness Extr: Marked lower extremity edema to thighs   Assessment & Plan:    1.  Penile/scrotal edema  Secondary to excess total body fluid and not primary GU pathology  Denies shortness of breath, DOE  Check basic metabolic panel  Keep PCP follow-up  Instructed to proceed to ED should he develop shortness of breath   [name], MD  [place] [address], [place], [place] [address] [phone]

## 2020-05-14 LAB — BASIC METABOLIC PANEL
BUN/Creatinine Ratio: 14 (ref 10–24)
BUN: 11 mg/dL (ref 8–27)
CO2: 25 mmol/L (ref 20–29)
Calcium: 9.4 mg/dL (ref 8.6–10.2)
Chloride: 100 mmol/L (ref 96–106)
Creatinine, Ser: 0.81 mg/dL (ref 0.76–1.27)
Glucose: 127 mg/dL — ABNORMAL HIGH (ref 65–99)
Potassium: 3.3 mmol/L — ABNORMAL LOW (ref 3.5–5.2)
Sodium: 144 mmol/L (ref 134–144)
eGFR: 90 mL/min/{1.73_m2} (ref >59)

## 2020-05-18 ENCOUNTER — Telehealth: Payer: Self-pay | Admitting: *Deleted

## 2020-05-18 NOTE — Telephone Encounter (Signed)
Notified patient as instructed, patient pleased °

## 2020-05-18 NOTE — Telephone Encounter (Signed)
-----   Message from [name], MD sent at 05/15/2020  8:31 PM EDT ----- Kidney function was nml.

## 2020-05-21 DIAGNOSIS — L309 Dermatitis, unspecified: Secondary | ICD-10-CM | POA: Diagnosis not present

## 2020-05-21 DIAGNOSIS — R269 Unspecified abnormalities of gait and mobility: Secondary | ICD-10-CM | POA: Diagnosis not present

## 2020-05-21 DIAGNOSIS — E1165 Type 2 diabetes mellitus with hyperglycemia: Secondary | ICD-10-CM | POA: Diagnosis not present

## 2020-05-21 DIAGNOSIS — Z79899 Other long term (current) drug therapy: Secondary | ICD-10-CM | POA: Diagnosis not present

## 2020-05-21 DIAGNOSIS — E559 Vitamin D deficiency, unspecified: Secondary | ICD-10-CM | POA: Diagnosis not present

## 2020-05-21 DIAGNOSIS — M109 Gout, unspecified: Secondary | ICD-10-CM | POA: Diagnosis not present

## 2020-05-21 DIAGNOSIS — I10 Essential (primary) hypertension: Secondary | ICD-10-CM | POA: Diagnosis not present

## 2020-05-21 DIAGNOSIS — N4889 Other specified disorders of penis: Secondary | ICD-10-CM | POA: Diagnosis not present

## 2020-05-21 DIAGNOSIS — Z0001 Encounter for general adult medical examination with abnormal findings: Secondary | ICD-10-CM | POA: Diagnosis not present

## 2020-05-21 DIAGNOSIS — H543 Unqualified visual loss, both eyes: Secondary | ICD-10-CM | POA: Diagnosis not present

## 2020-05-21 DIAGNOSIS — Z7984 Long term (current) use of oral hypoglycemic drugs: Secondary | ICD-10-CM | POA: Diagnosis not present

## 2020-06-05 DIAGNOSIS — I509 Heart failure, unspecified: Secondary | ICD-10-CM | POA: Diagnosis not present

## 2020-06-08 DIAGNOSIS — H401133 Primary open-angle glaucoma, bilateral, severe stage: Secondary | ICD-10-CM | POA: Diagnosis not present

## 2020-06-08 DIAGNOSIS — H4051X3 Glaucoma secondary to other eye disorders, right eye, severe stage: Secondary | ICD-10-CM | POA: Diagnosis not present

## 2020-06-08 DIAGNOSIS — H44521 Atrophy of globe, right eye: Secondary | ICD-10-CM | POA: Diagnosis not present

## 2020-07-02 DIAGNOSIS — E876 Hypokalemia: Secondary | ICD-10-CM | POA: Diagnosis not present

## 2020-10-01 ENCOUNTER — Telehealth (HOSPITAL_COMMUNITY): Payer: Self-pay

## 2020-10-01 NOTE — Telephone Encounter (Addendum)
Faxed return that he's not a patient in our office.

## 2020-10-12 DIAGNOSIS — H401133 Primary open-angle glaucoma, bilateral, severe stage: Secondary | ICD-10-CM | POA: Diagnosis not present

## 2020-10-12 DIAGNOSIS — H4051X3 Glaucoma secondary to other eye disorders, right eye, severe stage: Secondary | ICD-10-CM | POA: Diagnosis not present

## 2020-11-13 ENCOUNTER — Other Ambulatory Visit: Payer: Self-pay

## 2020-11-13 ENCOUNTER — Ambulatory Visit: Payer: Medicare PPO | Admitting: Urology

## 2020-11-13 ENCOUNTER — Encounter: Payer: Self-pay | Admitting: Urology

## 2020-11-13 VITALS — BP 128/91 | HR 93 | Ht 78.0 in | Wt 295.0 lb

## 2020-11-13 DIAGNOSIS — N138 Other obstructive and reflux uropathy: Secondary | ICD-10-CM | POA: Diagnosis not present

## 2020-11-13 DIAGNOSIS — C61 Malignant neoplasm of prostate: Secondary | ICD-10-CM | POA: Diagnosis not present

## 2020-11-13 DIAGNOSIS — N401 Enlarged prostate with lower urinary tract symptoms: Secondary | ICD-10-CM

## 2020-11-13 MED ORDER — DUTASTERIDE 0.5 MG PO CAPS: 0.5000 mg | ORAL_CAPSULE | Freq: Every day | ORAL | 3 refills | Status: DC

## 2020-11-13 MED ORDER — DOXAZOSIN MESYLATE 8 MG PO TABS: 8.0000 mg | ORAL_TABLET | Freq: Every day | ORAL | 3 refills | Status: DC

## 2020-11-13 NOTE — Progress Notes (Signed)
11/13/2020 10:21 AM   Krista Blue Slemmer 1941/01/14 [id number]  Referring provider: Josetta Huddle, MD [address] [place] [address] [place],  [place] [address]  Chief Complaint  Patient presents with   Follow-up    Urologic problem list: 1. Prostate cancer, moderate risk on active surveillance Prostate biopsy was performed at [facility] in [place] in April 2006 for a PSA of 6.5.        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                         Prostate volume 150 cc; could not decide on treatment options and elected active surveillance.                     Repeat prostate biopsy November 2010 showed a volume of 198 cc and no cancer.    prostate MRI performed September 2017 showed a 1.4 cm PIRADS 4 lesion in the transition zone in the right mid gland.  No adenopathy or evidence of extracapsular extension was identified.        elected to continue active surveillance.   2. BPH with lower urinary tract symptoms doxazosin and dutasteride  HPI: 80 y.o. male presents for follow-up.  Seen 05/13/2020 with penile/scrotal edema with a clinical picture of anasarca He states the edema resolved with diuretic therapy Has noted some increased urinary urgency when standing; remains on doxazosin/dutasteride Denies dysuria, gross hematuria Denies flank, abdominal or pelvic pain Last PSA 11/15/2019 was 3.3 (uncorrected)  PMH: History reviewed. No pertinent past medical history.  Surgical History: History reviewed. No pertinent surgical history.  Home Medications:  Allergies as of 11/13/2020       Reactions   Guaifenesin Rash, Swelling   Influenza Vaccines    Other reaction(s): flulike illness        Medication List        Accurate as of November 13, 2020 10:21 AM. If you have any questions, ask your nurse or doctor.          acetaZOLAMIDE 250 MG tablet Commonly known as: DIAMOX Take 250 mg by mouth.    amLODipine 10 MG tablet Commonly known as: NORVASC Take 10 mg by mouth.   aspirin EC 81 MG tablet 81 mg daily.   aspirin-acetaminophen-caffeine 924-268-34 MG tablet Commonly known as: EXCEDRIN MIGRAINE Take by mouth.   atorvastatin 10 MG tablet Commonly known as: LIPITOR 1 tablet   atropine 1 % ophthalmic solution   brimonidine 0.15 % ophthalmic solution Commonly known as: ALPHAGAN Place 1 drop into both eyes 3 times daily.   ciprofloxacin 0.3 % ophthalmic solution Commonly known as: CILOXAN   colchicine 0.6 MG tablet one tab   dorzolamide-timolol 22.3-6.8 MG/ML ophthalmic solution Commonly known as: COSOPT PLACE ON DROP INTO BOTH EYES TWO TIMES DAILY   doxazosin 8 MG tablet Commonly known as: CARDURA Take 1 tablet (8 mg total) by mouth daily.   dutasteride 0.5 MG capsule Commonly known as: AVODART Take 1 capsule (0.5 mg total) by mouth daily.   FOLIC ACID PO Take by mouth.   Garlic Oil 2 MG Caps Take by mouth.   indomethacin 50 MG capsule Commonly known as: INDOCIN   latanoprost 0.005 % ophthalmic solution Commonly known as: XALATAN Place 1 drop into the right eye nightly.   magnesium citrate Soln 150 ml   metFORMIN 500 MG tablet Commonly known as: GLUCOPHAGE  Take 500 mg by mouth.   metoprolol succinate 50 MG 24 hr tablet Commonly known as: TOPROL-XL Take 50 mg by mouth.   metoprolol tartrate 25 MG tablet Commonly known as: LOPRESSOR Take 25 mg by mouth.   metoprolol tartrate 50 MG tablet Commonly known as: LOPRESSOR   olmesartan-hydrochlorothiazide 40-12.5 MG tablet Commonly known as: BENICAR HCT   pilocarpine 2 % ophthalmic solution Commonly known as: PILOCAR Frequency:QID   Dosage:0.0     Instructions:  Note:Dose: 2 %   pilocarpine 4 % ophthalmic solution Commonly known as: PILOCAR   prednisoLONE acetate 1 % ophthalmic suspension Commonly known as: PRED FORTE INSTILL 1 DROP INTO RIGHT EYE ONCE DAILY   CENTRUM ADULTS PO daily.  one by mouth once a day (men's ultra formula)   PreserVision/Lutein Caps Take by mouth.   valsartan-hydrochlorothiazide 320-12.5 MG tablet Commonly known as: DIOVAN-HCT Take by mouth.   Vitamin D3 50 MCG (2000 UT) capsule 1 capsule daily.   Vitamin E 400 units Tabs 400 Units daily.        Allergies:  Allergies  Allergen Reactions   Guaifenesin Rash and Swelling   Influenza Vaccines     Other reaction(s): flulike illness    Family History: History reviewed. No pertinent family history.  Social History:  reports that he has never smoked. He has never used smokeless tobacco. He reports that he does not currently use alcohol. He reports that he does not use drugs.   Physical Exam: BP (!) 128/91   Pulse 93   Ht 6\' 6"  (1.981 m)   Wt 295 lb (133.8 kg)   BMI 34.09 kg/m   Constitutional:  Alert, no acute distress. HEENT: [place] AT, moist mucus membranes.  Trachea midline, no masses. Cardiovascular: No clubbing, cyanosis, or edema. Respiratory: Normal respiratory effort, no increased work of breathing. GU: Prostate 60+ cc, smooth without nodules Psychiatric: Normal mood and affect.   Assessment & Plan:    1.  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  BPH with LUTS Some increased urgency when standing  doxazosin/dutasteride refills sent   [name], [place] [address], [place], [place] [address] [phone]

## 2020-11-15 ENCOUNTER — Encounter: Payer: Self-pay | Admitting: Urology

## 2020-11-18 ENCOUNTER — Telehealth: Payer: Self-pay | Admitting: *Deleted

## 2020-11-18 LAB — PSA: Prostate Specific Ag, Serum: 3 ng/mL (ref 0.0–4.0)

## 2020-11-18 NOTE — Telephone Encounter (Signed)
Notified patient as instructed, patient pleased. Discussed follow-up appointments, patient agrees  

## 2020-11-18 NOTE — Telephone Encounter (Signed)
-----   Message from [name], MD sent at 11/18/2020 12:18 PM EDT ----- PSA stable 3.0

## 2020-11-19 DIAGNOSIS — I509 Heart failure, unspecified: Secondary | ICD-10-CM | POA: Diagnosis not present

## 2020-11-19 DIAGNOSIS — E261 Secondary hyperaldosteronism: Secondary | ICD-10-CM | POA: Diagnosis not present

## 2020-11-19 DIAGNOSIS — E11620 Type 2 diabetes mellitus with diabetic dermatitis: Secondary | ICD-10-CM | POA: Diagnosis not present

## 2020-11-19 DIAGNOSIS — H409 Unspecified glaucoma: Secondary | ICD-10-CM | POA: Diagnosis not present

## 2020-11-19 DIAGNOSIS — Z7984 Long term (current) use of oral hypoglycemic drugs: Secondary | ICD-10-CM | POA: Diagnosis not present

## 2020-11-19 DIAGNOSIS — Z6834 Body mass index (BMI) 34.0-34.9, adult: Secondary | ICD-10-CM | POA: Diagnosis not present

## 2020-11-19 DIAGNOSIS — I110 Hypertensive heart disease with heart failure: Secondary | ICD-10-CM | POA: Diagnosis not present

## 2020-11-19 DIAGNOSIS — E669 Obesity, unspecified: Secondary | ICD-10-CM | POA: Diagnosis not present

## 2020-11-19 DIAGNOSIS — N400 Enlarged prostate without lower urinary tract symptoms: Secondary | ICD-10-CM | POA: Diagnosis not present

## 2020-12-03 DIAGNOSIS — H31012 Macula scars of posterior pole (postinflammatory) (post-traumatic), left eye: Secondary | ICD-10-CM | POA: Diagnosis not present

## 2020-12-03 DIAGNOSIS — H4051X3 Glaucoma secondary to other eye disorders, right eye, severe stage: Secondary | ICD-10-CM | POA: Diagnosis not present

## 2020-12-03 DIAGNOSIS — Z961 Presence of intraocular lens: Secondary | ICD-10-CM | POA: Diagnosis not present

## 2020-12-03 DIAGNOSIS — H401133 Primary open-angle glaucoma, bilateral, severe stage: Secondary | ICD-10-CM | POA: Diagnosis not present

## 2020-12-17 ENCOUNTER — Telehealth (HOSPITAL_COMMUNITY): Payer: Self-pay | Admitting: Vascular Surgery

## 2020-12-17 NOTE — Telephone Encounter (Signed)
Pt is not appropriate for the [facility] clinic, form faxed to [facility] referral office

## 2021-02-12 DIAGNOSIS — Z23 Encounter for immunization: Secondary | ICD-10-CM

## 2021-02-28 DIAGNOSIS — H4050X0 Glaucoma secondary to other eye disorders, unspecified eye, stage unspecified: Secondary | ICD-10-CM

## 2021-02-28 HISTORY — DX: Glaucoma secondary to other eye disorders, unspecified eye, stage unspecified: H40.50X0

## 2021-03-29 DIAGNOSIS — H4051X3 Glaucoma secondary to other eye disorders, right eye, severe stage: Secondary | ICD-10-CM | POA: Diagnosis not present

## 2021-03-29 DIAGNOSIS — H401133 Primary open-angle glaucoma, bilateral, severe stage: Secondary | ICD-10-CM | POA: Diagnosis not present

## 2021-04-23 ENCOUNTER — Emergency Department (HOSPITAL_COMMUNITY): Payer: Medicare PPO

## 2021-04-23 ENCOUNTER — Other Ambulatory Visit: Payer: Self-pay

## 2021-04-23 ENCOUNTER — Inpatient Hospital Stay (HOSPITAL_COMMUNITY): Payer: Medicare PPO

## 2021-04-23 ENCOUNTER — Inpatient Hospital Stay (HOSPITAL_COMMUNITY)
Admission: EM | Admit: 2021-04-23 | Discharge: 2021-05-14 | DRG: 673 | Disposition: A | Discharge status: Skilled Nursing Facility | Payer: Medicare PPO | Attending: Internal Medicine | Admitting: Internal Medicine

## 2021-04-23 ENCOUNTER — Encounter (HOSPITAL_COMMUNITY): Payer: Self-pay | Admitting: Emergency Medicine

## 2021-04-23 DIAGNOSIS — R601 Generalized edema: Secondary | ICD-10-CM | POA: Diagnosis not present

## 2021-04-23 DIAGNOSIS — R0602 Shortness of breath: Secondary | ICD-10-CM | POA: Diagnosis not present

## 2021-04-23 DIAGNOSIS — Z95828 Presence of other vascular implants and grafts: Secondary | ICD-10-CM

## 2021-04-23 DIAGNOSIS — N185 Chronic kidney disease, stage 5: Secondary | ICD-10-CM | POA: Diagnosis not present

## 2021-04-23 DIAGNOSIS — I5082 Biventricular heart failure: Secondary | ICD-10-CM | POA: Diagnosis present

## 2021-04-23 DIAGNOSIS — Z789 Other specified health status: Secondary | ICD-10-CM

## 2021-04-23 DIAGNOSIS — D62 Acute posthemorrhagic anemia: Secondary | ICD-10-CM | POA: Diagnosis not present

## 2021-04-23 DIAGNOSIS — Z66 Do not resuscitate: Secondary | ICD-10-CM | POA: Diagnosis present

## 2021-04-23 DIAGNOSIS — Z4901 Encounter for fitting and adjustment of extracorporeal dialysis catheter: Secondary | ICD-10-CM | POA: Diagnosis not present

## 2021-04-23 DIAGNOSIS — N281 Cyst of kidney, acquired: Secondary | ICD-10-CM | POA: Diagnosis not present

## 2021-04-23 DIAGNOSIS — R188 Other ascites: Secondary | ICD-10-CM | POA: Diagnosis present

## 2021-04-23 DIAGNOSIS — Z7982 Long term (current) use of aspirin: Secondary | ICD-10-CM

## 2021-04-23 DIAGNOSIS — D631 Anemia in chronic kidney disease: Secondary | ICD-10-CM | POA: Diagnosis not present

## 2021-04-23 DIAGNOSIS — E1110 Type 2 diabetes mellitus with ketoacidosis without coma: Secondary | ICD-10-CM | POA: Diagnosis not present

## 2021-04-23 DIAGNOSIS — N19 Unspecified kidney failure: Secondary | ICD-10-CM | POA: Diagnosis not present

## 2021-04-23 DIAGNOSIS — R1312 Dysphagia, oropharyngeal phase: Secondary | ICD-10-CM | POA: Diagnosis not present

## 2021-04-23 DIAGNOSIS — Z992 Dependence on renal dialysis: Secondary | ICD-10-CM | POA: Diagnosis not present

## 2021-04-23 DIAGNOSIS — E872 Acidosis, unspecified: Secondary | ICD-10-CM | POA: Diagnosis not present

## 2021-04-23 DIAGNOSIS — I120 Hypertensive chronic kidney disease with stage 5 chronic kidney disease or end stage renal disease: Secondary | ICD-10-CM | POA: Diagnosis not present

## 2021-04-23 DIAGNOSIS — I959 Hypotension, unspecified: Secondary | ICD-10-CM | POA: Diagnosis not present

## 2021-04-23 DIAGNOSIS — I4891 Unspecified atrial fibrillation: Secondary | ICD-10-CM | POA: Diagnosis present

## 2021-04-23 DIAGNOSIS — Z7401 Bed confinement status: Secondary | ICD-10-CM | POA: Diagnosis not present

## 2021-04-23 DIAGNOSIS — N170 Acute kidney failure with tubular necrosis: Principal | ICD-10-CM | POA: Diagnosis present

## 2021-04-23 DIAGNOSIS — G9349 Other encephalopathy: Secondary | ICD-10-CM | POA: Diagnosis not present

## 2021-04-23 DIAGNOSIS — E1165 Type 2 diabetes mellitus with hyperglycemia: Secondary | ICD-10-CM | POA: Diagnosis not present

## 2021-04-23 DIAGNOSIS — K5730 Diverticulosis of large intestine without perforation or abscess without bleeding: Secondary | ICD-10-CM | POA: Diagnosis not present

## 2021-04-23 DIAGNOSIS — I10 Essential (primary) hypertension: Secondary | ICD-10-CM | POA: Diagnosis not present

## 2021-04-23 DIAGNOSIS — R531 Weakness: Secondary | ICD-10-CM | POA: Diagnosis not present

## 2021-04-23 DIAGNOSIS — Z23 Encounter for immunization: Secondary | ICD-10-CM | POA: Diagnosis not present

## 2021-04-23 DIAGNOSIS — M6281 Muscle weakness (generalized): Secondary | ICD-10-CM | POA: Diagnosis not present

## 2021-04-23 DIAGNOSIS — I5031 Acute diastolic (congestive) heart failure: Secondary | ICD-10-CM | POA: Diagnosis not present

## 2021-04-23 DIAGNOSIS — R600 Localized edema: Secondary | ICD-10-CM | POA: Diagnosis not present

## 2021-04-23 DIAGNOSIS — K449 Diaphragmatic hernia without obstruction or gangrene: Secondary | ICD-10-CM | POA: Diagnosis not present

## 2021-04-23 DIAGNOSIS — R41841 Cognitive communication deficit: Secondary | ICD-10-CM | POA: Diagnosis not present

## 2021-04-23 DIAGNOSIS — I132 Hypertensive heart and chronic kidney disease with heart failure and with stage 5 chronic kidney disease, or end stage renal disease: Secondary | ICD-10-CM | POA: Diagnosis present

## 2021-04-23 DIAGNOSIS — E1122 Type 2 diabetes mellitus with diabetic chronic kidney disease: Secondary | ICD-10-CM | POA: Diagnosis present

## 2021-04-23 DIAGNOSIS — N400 Enlarged prostate without lower urinary tract symptoms: Secondary | ICD-10-CM | POA: Diagnosis present

## 2021-04-23 DIAGNOSIS — R791 Abnormal coagulation profile: Secondary | ICD-10-CM | POA: Diagnosis present

## 2021-04-23 DIAGNOSIS — D696 Thrombocytopenia, unspecified: Secondary | ICD-10-CM | POA: Diagnosis present

## 2021-04-23 DIAGNOSIS — R34 Anuria and oliguria: Secondary | ICD-10-CM | POA: Diagnosis not present

## 2021-04-23 DIAGNOSIS — R52 Pain, unspecified: Secondary | ICD-10-CM | POA: Diagnosis not present

## 2021-04-23 DIAGNOSIS — Z20822 Contact with and (suspected) exposure to covid-19: Secondary | ICD-10-CM | POA: Diagnosis not present

## 2021-04-23 DIAGNOSIS — N178 Other acute kidney failure: Secondary | ICD-10-CM | POA: Diagnosis not present

## 2021-04-23 DIAGNOSIS — Z888 Allergy status to other drugs, medicaments and biological substances status: Secondary | ICD-10-CM

## 2021-04-23 DIAGNOSIS — H401190 Primary open-angle glaucoma, unspecified eye, stage unspecified: Secondary | ICD-10-CM | POA: Diagnosis present

## 2021-04-23 DIAGNOSIS — K5900 Constipation, unspecified: Secondary | ICD-10-CM | POA: Diagnosis not present

## 2021-04-23 DIAGNOSIS — E11649 Type 2 diabetes mellitus with hypoglycemia without coma: Secondary | ICD-10-CM | POA: Diagnosis present

## 2021-04-23 DIAGNOSIS — E871 Hypo-osmolality and hyponatremia: Secondary | ICD-10-CM | POA: Diagnosis present

## 2021-04-23 DIAGNOSIS — N269 Renal sclerosis, unspecified: Secondary | ICD-10-CM | POA: Diagnosis present

## 2021-04-23 DIAGNOSIS — M109 Gout, unspecified: Secondary | ICD-10-CM | POA: Diagnosis present

## 2021-04-23 DIAGNOSIS — J9811 Atelectasis: Secondary | ICD-10-CM | POA: Diagnosis not present

## 2021-04-23 DIAGNOSIS — I517 Cardiomegaly: Secondary | ICD-10-CM | POA: Diagnosis not present

## 2021-04-23 DIAGNOSIS — N289 Disorder of kidney and ureter, unspecified: Secondary | ICD-10-CM | POA: Diagnosis not present

## 2021-04-23 DIAGNOSIS — E162 Hypoglycemia, unspecified: Secondary | ICD-10-CM | POA: Diagnosis not present

## 2021-04-23 DIAGNOSIS — E875 Hyperkalemia: Secondary | ICD-10-CM

## 2021-04-23 DIAGNOSIS — R680 Hypothermia, not associated with low environmental temperature: Secondary | ICD-10-CM | POA: Diagnosis not present

## 2021-04-23 DIAGNOSIS — Z6827 Body mass index (BMI) 27.0-27.9, adult: Secondary | ICD-10-CM

## 2021-04-23 DIAGNOSIS — L89151 Pressure ulcer of sacral region, stage 1: Secondary | ICD-10-CM | POA: Diagnosis present

## 2021-04-23 DIAGNOSIS — R319 Hematuria, unspecified: Secondary | ICD-10-CM | POA: Diagnosis not present

## 2021-04-23 DIAGNOSIS — I361 Nonrheumatic tricuspid (valve) insufficiency: Secondary | ICD-10-CM | POA: Diagnosis present

## 2021-04-23 DIAGNOSIS — E669 Obesity, unspecified: Secondary | ICD-10-CM | POA: Diagnosis present

## 2021-04-23 DIAGNOSIS — Z515 Encounter for palliative care: Secondary | ICD-10-CM | POA: Diagnosis not present

## 2021-04-23 DIAGNOSIS — E43 Unspecified severe protein-calorie malnutrition: Secondary | ICD-10-CM | POA: Diagnosis present

## 2021-04-23 DIAGNOSIS — Z79899 Other long term (current) drug therapy: Secondary | ICD-10-CM

## 2021-04-23 DIAGNOSIS — N179 Acute kidney failure, unspecified: Principal | ICD-10-CM

## 2021-04-23 DIAGNOSIS — M6259 Muscle wasting and atrophy, not elsewhere classified, multiple sites: Secondary | ICD-10-CM | POA: Diagnosis not present

## 2021-04-23 DIAGNOSIS — I509 Heart failure, unspecified: Secondary | ICD-10-CM | POA: Diagnosis not present

## 2021-04-23 DIAGNOSIS — Z887 Allergy status to serum and vaccine status: Secondary | ICD-10-CM

## 2021-04-23 DIAGNOSIS — R0600 Dyspnea, unspecified: Secondary | ICD-10-CM | POA: Diagnosis not present

## 2021-04-23 DIAGNOSIS — R310 Gross hematuria: Secondary | ICD-10-CM | POA: Diagnosis not present

## 2021-04-23 DIAGNOSIS — H548 Legal blindness, as defined in USA: Secondary | ICD-10-CM | POA: Diagnosis present

## 2021-04-23 DIAGNOSIS — R2689 Other abnormalities of gait and mobility: Secondary | ICD-10-CM | POA: Diagnosis not present

## 2021-04-23 DIAGNOSIS — R000 Tachycardia, unspecified: Secondary | ICD-10-CM | POA: Diagnosis not present

## 2021-04-23 DIAGNOSIS — N186 End stage renal disease: Secondary | ICD-10-CM | POA: Diagnosis not present

## 2021-04-23 DIAGNOSIS — E161 Other hypoglycemia: Secondary | ICD-10-CM | POA: Diagnosis not present

## 2021-04-23 DIAGNOSIS — Z452 Encounter for adjustment and management of vascular access device: Secondary | ICD-10-CM

## 2021-04-23 DIAGNOSIS — Z7189 Other specified counseling: Secondary | ICD-10-CM | POA: Diagnosis not present

## 2021-04-23 DIAGNOSIS — H547 Unspecified visual loss: Secondary | ICD-10-CM | POA: Diagnosis not present

## 2021-04-23 DIAGNOSIS — Z7984 Long term (current) use of oral hypoglycemic drugs: Secondary | ICD-10-CM

## 2021-04-23 DIAGNOSIS — L8990 Pressure ulcer of unspecified site, unspecified stage: Secondary | ICD-10-CM | POA: Insufficient documentation

## 2021-04-23 HISTORY — DX: Type 2 diabetes mellitus without complications: E11.9

## 2021-04-23 HISTORY — DX: Unspecified visual loss: H54.7

## 2021-04-23 HISTORY — DX: Essential (primary) hypertension: I10

## 2021-04-23 HISTORY — PX: IR PARACENTESIS: IMG2679

## 2021-04-23 LAB — I-STAT VENOUS BLOOD GAS, ED
Acid-base deficit: 14 mmol/L — ABNORMAL HIGH (ref 0.0–2.0)
Bicarbonate: 11.7 mmol/L — ABNORMAL LOW (ref 20.0–28.0)
Calcium, Ion: 1.15 mmol/L (ref 1.15–1.40)
HCT: 39 % (ref 39.0–52.0)
Hemoglobin: 13.3 g/dL (ref 13.0–17.0)
O2 Saturation: 92 %
Potassium: 5.9 mmol/L — ABNORMAL HIGH (ref 3.5–5.1)
Sodium: 131 mmol/L — ABNORMAL LOW (ref 135–145)
TCO2: 12 mmol/L — ABNORMAL LOW (ref 22–32)
pCO2, Ven: 26.9 mmHg — ABNORMAL LOW (ref 44–60)
pH, Ven: 7.245 — ABNORMAL LOW (ref 7.25–7.43)
pO2, Ven: 73 mmHg — ABNORMAL HIGH (ref 32–45)

## 2021-04-23 LAB — PROTIME-INR
INR: 1.6 — ABNORMAL HIGH (ref 0.8–1.2)
Prothrombin Time: 18.7 seconds — ABNORMAL HIGH (ref 11.4–15.2)

## 2021-04-23 LAB — GLUCOSE, CAPILLARY
Glucose-Capillary: 94 mg/dL (ref 70–99)
Glucose-Capillary: 100 mg/dL — ABNORMAL HIGH (ref 70–99)

## 2021-04-23 LAB — COMPREHENSIVE METABOLIC PANEL
ALT: 11 U/L (ref 0–44)
AST: 18 U/L (ref 15–41)
Albumin: 3.4 g/dL — ABNORMAL LOW (ref 3.5–5.0)
Alkaline Phosphatase: 80 U/L (ref 38–126)
Anion gap: 22 — ABNORMAL HIGH (ref 5–15)
BUN: 118 mg/dL — ABNORMAL HIGH (ref 8–23)
CO2: 12 mmol/L — ABNORMAL LOW (ref 22–32)
Calcium: 9.6 mg/dL (ref 8.9–10.3)
Chloride: 99 mmol/L (ref 98–111)
Creatinine, Ser: 15.52 mg/dL — ABNORMAL HIGH (ref 0.61–1.24)
GFR, Estimated: 3 mL/min — ABNORMAL LOW (ref >60)
Glucose, Bld: 67 mg/dL — ABNORMAL LOW (ref 70–99)
Potassium: 6.1 mmol/L — ABNORMAL HIGH (ref 3.5–5.1)
Sodium: 133 mmol/L — ABNORMAL LOW (ref 135–145)
Total Bilirubin: 1.2 mg/dL (ref 0.3–1.2)
Total Protein: 7.1 g/dL (ref 6.5–8.1)

## 2021-04-23 LAB — LACTIC ACID, PLASMA
Lactic Acid, Venous: 3.8 mmol/L (ref 0.5–1.9)
Lactic Acid, Venous: 4.3 mmol/L (ref 0.5–1.9)

## 2021-04-23 LAB — RESP PANEL BY RT-PCR (FLU A&B, COVID) ARPGX2
Influenza A by PCR: NEGATIVE
Influenza B by PCR: NEGATIVE
SARS Coronavirus 2 by RT PCR: NEGATIVE

## 2021-04-23 LAB — CBC WITH DIFFERENTIAL/PLATELET
Abs Immature Granulocytes: 0.01 10*3/uL (ref 0.00–0.07)
Basophils Absolute: 0 10*3/uL (ref 0.0–0.1)
Basophils Relative: 0 %
Eosinophils Absolute: 0.1 10*3/uL (ref 0.0–0.5)
Eosinophils Relative: 1 %
HCT: 38.5 % — ABNORMAL LOW (ref 39.0–52.0)
Hemoglobin: 12.7 g/dL — ABNORMAL LOW (ref 13.0–17.0)
Immature Granulocytes: 0 %
Lymphocytes Relative: 8 %
Lymphs Abs: 0.5 10*3/uL — ABNORMAL LOW (ref 0.7–4.0)
MCH: 32.3 pg (ref 26.0–34.0)
MCHC: 33 g/dL (ref 30.0–36.0)
MCV: 98 fL (ref 80.0–100.0)
Monocytes Absolute: 0.7 10*3/uL (ref 0.1–1.0)
Monocytes Relative: 10 %
Neutro Abs: 5.3 10*3/uL (ref 1.7–7.7)
Neutrophils Relative %: 81 %
Platelets: 143 10*3/uL — ABNORMAL LOW (ref 150–400)
RBC: 3.93 MIL/uL — ABNORMAL LOW (ref 4.22–5.81)
RDW: 15 % (ref 11.5–15.5)
WBC: 6.6 10*3/uL (ref 4.0–10.5)
nRBC: 0 % (ref 0.0–0.2)

## 2021-04-23 LAB — BRAIN NATRIURETIC PEPTIDE: B Natriuretic Peptide: 1183.2 pg/mL — ABNORMAL HIGH (ref 0.0–100.0)

## 2021-04-23 LAB — HEMOGLOBIN A1C
Hgb A1c MFr Bld: 4.8 % (ref 4.8–5.6)
Mean Plasma Glucose: 91.06 mg/dL

## 2021-04-23 LAB — CBG MONITORING, ED: Glucose-Capillary: 110 mg/dL — ABNORMAL HIGH (ref 70–99)

## 2021-04-23 LAB — TROPONIN I (HIGH SENSITIVITY)
Troponin I (High Sensitivity): 17 ng/L (ref <18)
Troponin I (High Sensitivity): 16 ng/L (ref <18)

## 2021-04-23 LAB — HEPARIN LEVEL (UNFRACTIONATED): Heparin Unfractionated: 0.3 IU/mL (ref 0.30–0.70)

## 2021-04-23 MED ORDER — LIDOCAINE HCL (PF) 1 % IJ SOLN
INTRAMUSCULAR | Status: DC | PRN
  Administered 2021-04-23: 10 mL

## 2021-04-23 MED ORDER — BRIMONIDINE TARTRATE 0.2 % OP SOLN
1.0000 [drp] | Freq: Three times a day (TID) | OPHTHALMIC | Status: DC
  Administered 2021-04-23 – 2021-05-14 (×56): 1 [drp] via OPHTHALMIC
  Filled 2021-04-23 (×2): qty 5

## 2021-04-23 MED ORDER — PANTOPRAZOLE SODIUM 40 MG PO TBEC
40.0000 mg | DELAYED_RELEASE_TABLET | Freq: Every day | ORAL | Status: DC
  Administered 2021-04-23 – 2021-05-14 (×21): 40 mg via ORAL
  Filled 2021-04-23 (×21): qty 1

## 2021-04-23 MED ORDER — INSULIN ASPART 100 UNIT/ML IJ SOLN
5.0000 [IU] | Freq: Once | INTRAMUSCULAR | Status: AC
  Administered 2021-04-23: 5 [IU] via INTRAVENOUS

## 2021-04-23 MED ORDER — DORZOLAMIDE HCL-TIMOLOL MAL 2-0.5 % OP SOLN
1.0000 [drp] | Freq: Two times a day (BID) | OPHTHALMIC | Status: DC
  Administered 2021-04-23 – 2021-05-14 (×41): 1 [drp] via OPHTHALMIC
  Filled 2021-04-23 (×2): qty 10

## 2021-04-23 MED ORDER — LATANOPROST 0.005 % OP SOLN
1.0000 [drp] | Freq: Every day | OPHTHALMIC | Status: DC
  Administered 2021-04-23 – 2021-05-13 (×21): 1 [drp] via OPHTHALMIC
  Filled 2021-04-23 (×2): qty 2.5

## 2021-04-23 MED ORDER — SODIUM BICARBONATE 650 MG PO TABS
1300.0000 mg | ORAL_TABLET | Freq: Two times a day (BID) | ORAL | Status: DC
  Administered 2021-04-23 – 2021-04-28 (×10): 1300 mg via ORAL
  Filled 2021-04-23 (×10): qty 2

## 2021-04-23 MED ORDER — PREDNISOLONE ACETATE 1 % OP SUSP
1.0000 [drp] | Freq: Every day | OPHTHALMIC | Status: DC
  Administered 2021-04-23 – 2021-05-13 (×21): 1 [drp] via OPHTHALMIC
  Filled 2021-04-23 (×2): qty 5

## 2021-04-23 MED ORDER — CALCIUM GLUCONATE-NACL 1-0.675 GM/50ML-% IV SOLN
1.0000 g | Freq: Once | INTRAVENOUS | Status: AC
  Administered 2021-04-23: 1000 mg via INTRAVENOUS
  Filled 2021-04-23: qty 50

## 2021-04-23 MED ORDER — LIDOCAINE HCL 1 % IJ SOLN
INTRAMUSCULAR | Status: AC
  Filled 2021-04-23: qty 20

## 2021-04-23 MED ORDER — SODIUM ZIRCONIUM CYCLOSILICATE 10 G PO PACK: 10.0000 g | PACK | Freq: Once | ORAL | Status: DC

## 2021-04-23 MED ORDER — PILOCARPINE HCL 4 % OP SOLN
1.0000 [drp] | Freq: Three times a day (TID) | OPHTHALMIC | Status: DC
  Administered 2021-04-23 – 2021-05-14 (×57): 1 [drp] via OPHTHALMIC
  Filled 2021-04-23 (×2): qty 15

## 2021-04-23 MED ORDER — ALBUMIN HUMAN 25 % IV SOLN
25.0000 g | Freq: Four times a day (QID) | INTRAVENOUS | Status: AC
  Administered 2021-04-23 – 2021-04-24 (×3): 25 g via INTRAVENOUS
  Filled 2021-04-23 (×3): qty 100

## 2021-04-23 MED ORDER — INSULIN ASPART 100 UNIT/ML IJ SOLN: 0.0000 [IU] | Freq: Three times a day (TID) | INTRAMUSCULAR | Status: DC

## 2021-04-23 MED ORDER — HEPARIN (PORCINE) 25000 UT/250ML-% IV SOLN
1700.0000 [IU]/h | INTRAVENOUS | Status: DC
  Administered 2021-04-23: 1700 [IU]/h via INTRAVENOUS
  Filled 2021-04-23: qty 250

## 2021-04-23 MED ORDER — TRIAMCINOLONE ACETONIDE 0.1 % EX OINT
1.0000 "application " | TOPICAL_OINTMENT | Freq: Every day | CUTANEOUS | Status: DC
  Administered 2021-04-25 – 2021-05-12 (×13): 1 via TOPICAL
  Filled 2021-04-23 (×3): qty 15

## 2021-04-23 MED ORDER — FUROSEMIDE 10 MG/ML IJ SOLN
160.0000 mg | Freq: Four times a day (QID) | INTRAMUSCULAR | Status: DC
Start: 2021-04-23 — End: 2021-04-26
  Administered 2021-04-23 – 2021-04-26 (×10): 160 mg via INTRAVENOUS
  Filled 2021-04-23 (×5): qty 16
  Filled 2021-04-23 (×2): qty 10
  Filled 2021-04-23 (×2): qty 16
  Filled 2021-04-23: qty 2
  Filled 2021-04-23 (×2): qty 16
  Filled 2021-04-23: qty 10
  Filled 2021-04-23 (×2): qty 16
  Filled 2021-04-23 (×3): qty 10
  Filled 2021-04-23: qty 16

## 2021-04-23 MED ORDER — SODIUM ZIRCONIUM CYCLOSILICATE 10 G PO PACK
10.0000 g | PACK | Freq: Every day | ORAL | Status: DC
  Administered 2021-04-23 – 2021-04-25 (×3): 10 g via ORAL
  Filled 2021-04-23 (×3): qty 1

## 2021-04-23 MED ORDER — ATROPINE SULFATE 1 % OP SOLN
1.0000 [drp] | Freq: Every day | OPHTHALMIC | Status: DC
  Administered 2021-04-23 – 2021-05-13 (×21): 1 [drp] via OPHTHALMIC
  Filled 2021-04-23 (×2): qty 2

## 2021-04-23 MED ORDER — METOPROLOL TARTRATE 5 MG/5ML IV SOLN: 2.5000 mg | Freq: Four times a day (QID) | INTRAVENOUS | Status: DC | PRN

## 2021-04-23 MED ORDER — ASPIRIN EC 81 MG PO TBEC
81.0000 mg | DELAYED_RELEASE_TABLET | Freq: Every morning | ORAL | Status: DC
  Administered 2021-04-24 – 2021-04-28 (×5): 81 mg via ORAL
  Filled 2021-04-23 (×4): qty 1

## 2021-04-23 MED ORDER — ACETAMINOPHEN 500 MG PO TABS
500.0000 mg | ORAL_TABLET | Freq: Every day | ORAL | Status: DC | PRN
  Administered 2021-04-28 – 2021-05-11 (×8): 500 mg via ORAL
  Filled 2021-04-23 (×9): qty 1

## 2021-04-23 MED ORDER — DEXTROSE 50 % IV SOLN
2.0000 | Freq: Once | INTRAVENOUS | Status: AC
  Administered 2021-04-23: 100 mL via INTRAVENOUS
  Filled 2021-04-23: qty 100

## 2021-04-23 NOTE — Progress Notes (Signed)
ANTICOAGULATION CONSULT NOTE - Initial Consult  Pharmacy Consult for Heparin Indication: atrial fibrillation  Allergies  Allergen Reactions   Guaifenesin Swelling and Rash    Hand and feet swelling from Robitussin   Influenza Vaccines Other (See Comments)    Caused flu-like illness   Lexapro [Escitalopram] Nausea And Vomiting    Reported by [facility] - pt does not recall    Patient Measurements: Height: 6\' 6"  (198.1 cm) Weight: 134.7 kg (297 lb) IBW/kg (Calculated) : 91.4 Heparin Dosing Weight: 120.4 kg  Vital Signs: Temp: 97.6 F (36.4 C) (02/24 1126) Temp Source: Oral (02/24 1126) BP: 108/72 (02/24 1430) Pulse Rate: 72 (02/24 1430)  Labs: Recent Labs    04/23/21 1137 04/23/21 1329 04/23/21 1333  HGB 12.7* 13.3  --   HCT 38.5* 39.0  --   PLT 143*  --   --   CREATININE 15.52*  --   --   TROPONINIHS 17  --  16    Estimated Creatinine Clearance: 5.9 mL/min (A) (by C-G formula based on SCr of 15.52 mg/dL (H)).   Medical History: Past Medical History:  Diagnosis Date   Blind    Diabetes mellitus without complication ([place])    Hypertension     Medications:  (Not in a hospital admission)  Scheduled:   sodium zirconium cyclosilicate  10 g Oral Daily   Infusions:  PRN:   Assessment: 37 yom presenting with SOB, weakness, dysuria. Heparin per pharmacy consult placed for atrial fibrillation.  Patient is not on anticoagulation prior to arrival.  Hgb 12.7; plt 143  Goal of Therapy:  Heparin level 0.3-0.7 units/ml Monitor platelets by anticoagulation protocol: Yes   Plan:  No initial bolus Start heparin infusion at 1700 units/hr Check anti-Xa level in 8 hours and daily while on heparin Continue to monitor H&H and platelets  Lorelei Pont, PharmD, BCPS 04/23/2021 2:51 PM ED Clinical Pharmacist -  [phone]

## 2021-04-23 NOTE — Consult Note (Signed)
Reason for Consult: ARF Referring Physician: Matilde Sprang, MD  Austin Wheeler is an 81 y.o. male with a PMH significant for DM type 2, HTN, blindness due to glaucoma, obesity, GERD, CHF (per PCP's PMH list, noted to be biventricular), prostate cancer (active surveillance), and BPH who presented to [facility] ED with complaints of worsening SOB, DOE, weakness for the past few days but has been also having increased lower extremity edema and abdominal girth for the past 3 weeks.  He also has noted decreased UOP for the past 2 weeks.  He was told by his PCP to go to ED to be evaluated.  In the ED he was afebrile, BP 98/65, SpO2 96% on room air.  He was also found to have atrial fibrillation on ECG with HR in the 70's-80's.  Labs were notable for Na 133, K 6.1, Co2 12, BUN 118, Cr 15.52, alb 3.4, BNP 1183, lactate 3.8, WBC 6.6, Hgb 12.7, plt 143.  CT scan of abdomen and pelvis revealed large volume ascites with diffuse anasarca, trace bilateral pleural effusions, but no obstructive uropathy.  Foley catheter was placed without any return of urine.  We were consulted to further evaluate and manage his ARF.  His Scr was normal 11 months ago but was started on lasix several months ago for edema.  He has been on an ARB prior to admission  He admits to anorexia and dysgeusia as well as increased weakness and fatigue.  He denies any family history of CKD, no use of NSAIDs or COX-II I's.  No prior history of kidney or liver disease.    Trend in Creatinine: Creatinine, Ser  Date/Time Value Ref Range Status  04/23/2021 11:37 AM 15.52 (H) 0.61 - 1.24 mg/dL Final  05/13/2020 10:51 AM 0.81 0.76 - 1.27 mg/dL Final  02/06/2008 07:15 PM 1.02 0.4 - 1.5 mg/dL Final    PMH:   Past Medical History:  Diagnosis Date   Blind    Diabetes mellitus without complication ([place])    Hypertension     PSH:  History reviewed. No pertinent surgical history.  Allergies:  Allergies  Allergen Reactions   Guaifenesin Swelling and Rash    Hand  and feet swelling from Robitussin   Influenza Vaccines Other (See Comments)    Caused flu-like illness   Lexapro [Escitalopram] Nausea And Vomiting    Reported by [facility] - pt does not recall    Medications:   Prior to Admission medications   Medication Sig Start Date End Date Taking? Authorizing Provider  acetaminophen (TYLENOL) 500 MG tablet Take 500 mg by mouth daily as needed for headache (pain).   Yes [provider]  amLODipine (NORVASC) 10 MG tablet Take 10 mg by mouth every morning. 04/11/12  Yes [provider]  aspirin EC 81 MG tablet 81 mg every morning.   Yes [provider]  atropine 1 % ophthalmic solution Place 1 drop into the right eye at bedtime. 11/11/19  Yes [provider]  brimonidine (ALPHAGAN) 0.2 % ophthalmic solution Place 1 drop into the left eye 3 (three) times daily. 02/22/21  Yes [provider]  cholecalciferol (VITAMIN D3) 25 MCG (1000 UNIT) tablet Take 1,000 Units by mouth 2 (two) times daily.   Yes [provider]  dorzolamide-timolol (COSOPT) 22.3-6.8 MG/ML ophthalmic solution Place 1 drop into the left eye 2 (two) times daily. 04/11/12  Yes [provider]  doxazosin (CARDURA) 8 MG tablet Take 1 tablet (8 mg total) by mouth daily. Patient  taking differently: Take 8 mg by mouth every morning. 11/13/20  Yes Stoioff, Ronda Fairly, MD  dutasteride (AVODART) 0.5 MG capsule Take 1 capsule (0.5 mg total) by mouth daily. Patient taking differently: Take 0.5 mg by mouth every Monday, Wednesday, and Friday. 11/13/20  Yes Stoioff, Ronda Fairly, MD  furosemide (LASIX) 40 MG tablet Take 40 mg by mouth every morning. 11/18/20  Yes [provider]  Garlic 371 MG TABS Take 500 mg by mouth every morning.   Yes [provider]  latanoprost (XALATAN) 0.005 % ophthalmic solution Place 1 drop into the left eye at bedtime. 04/11/12  Yes [provider]  metFORMIN (GLUCOPHAGE) 500 MG tablet Take 250  mg by mouth 2 (two) times daily. 03/20/13  Yes [provider]  metoprolol tartrate (LOPRESSOR) 50 MG tablet Take 50 mg by mouth 2 (two) times daily. 05/23/14  Yes [provider]  Multiple Vitamin (MULTIVITAMIN WITH MINERALS) TABS tablet Take 1 tablet by mouth in the morning.   Yes [provider]  Multiple Vitamins-Minerals (PRESERVISION AREDS 2) CAPS Take 1 capsule by mouth 2 (two) times daily.   Yes [provider]  olmesartan-hydrochlorothiazide (BENICAR HCT) 40-12.5 MG tablet Take 1 tablet by mouth every morning. 07/16/12  Yes [provider]  OVER THE COUNTER MEDICATION Take 1 capsule by mouth See admin instructions. Go Out (OTC) - take one capsule by mouth on Tuesday and Thursday morning (for gout)   Yes [provider]  pilocarpine (PILOCAR) 4 % ophthalmic solution Place 1 drop into the left eye 3 (three) times daily. 06/20/17  Yes [provider]  potassium chloride SA (KLOR-CON M) 20 MEQ tablet Take 10 mEq by mouth every morning.   Yes [provider]  prednisoLONE acetate (PRED FORTE) 1 % ophthalmic suspension Place 1 drop into the right eye at bedtime. 11/12/19  Yes [provider]  triamcinolone ointment (KENALOG) 0.1 % Apply 1 application topically See admin instructions. Apply topically legs nightly after compression stockings are removed 11/20/20  Yes [provider]  vitamin C (ASCORBIC ACID) 500 MG tablet Take 500 mg by mouth every Saturday.   Yes [provider]  vitamin E 180 MG (400 UNITS) capsule Take 400 Units by mouth every morning.   Yes [provider]    Inpatient medications:  sodium zirconium cyclosilicate  10 g Oral Daily    Discontinued Meds:   Medications Discontinued During This Encounter  Medication Reason   aspirin-acetaminophen-caffeine (EXCEDRIN MIGRAINE) 250-250-65 MG tablet Patient Preference   pilocarpine (PILOCAR) 2 % ophthalmic solution Change in  therapy   brimonidine (ALPHAGAN) 0.15 % ophthalmic solution Change in therapy   Vitamin E 400 units TABS Change in therapy   Garlic Oil 2 MG CAPS Change in therapy   Cholecalciferol (VITAMIN D3) 2000 units capsule Change in therapy   ciprofloxacin (CILOXAN) 0.3 % ophthalmic solution Completed Course   metoprolol tartrate (LOPRESSOR) 25 MG tablet Dose change   metoprolol succinate (TOPROL-XL) 50 MG 24 hr tablet Change in therapy   Multiple Vitamins-Minerals (PRESERVISION/LUTEIN) CAPS Change in therapy   Multiple Vitamins-Minerals (CENTRUM ADULTS PO) Inpatient Standard   acetaZOLAMIDE (DIAMOX) 250 MG tablet Discontinued by provider   atorvastatin (LIPITOR) 10 MG tablet Discontinued by provider   colchicine 0.6 MG tablet    FOLIC ACID PO Patient Preference   indomethacin (INDOCIN) 50 MG capsule Change in therapy   magnesium citrate SOLN Completed Course   valsartan-hydrochlorothiazide (DIOVAN-HCT) 320-12.5 MG tablet Change in therapy  Social History:  reports that he has never smoked. He has never used smokeless tobacco. He reports that he does not currently use alcohol. He reports that he does not use drugs.  Family History:  History reviewed. No pertinent family history.  Pertinent items are noted in HPI. Weight change:   Intake/Output Summary (Last 24 hours) at 04/23/2021 1531 Last data filed at 04/23/2021 1428 Gross per 24 hour  Intake --  Output 0 ml  Net 0 ml   BP 108/72    Pulse 72    Temp 97.6 F (36.4 C) (Oral)    Resp 20    Ht 6' 6" (1.981 m)    Wt 134.7 kg    SpO2 96%    BMI 34.32 kg/m  Vitals:   04/23/21 1230 04/23/21 1245 04/23/21 1315 04/23/21 1430  BP: 98/78 103/75 102/74 108/72  Pulse: 64 65 80 72  Resp: (!) _0 Temp:      TempSrc:      SpO2: 96% 94% 95% 96%  Weight:      Height:         General appearance: fatigued, no distress, and moderately obese Head: Normocephalic, without obvious abnormality, atraumatic Resp: clear to auscultation  bilaterally Cardio: irregularly irregular rhythm and no rub GI: normal findings: no bruits heard and soft, non-tender and abnormal findings:  ascites and distended Extremities: edema 3+ edema to chest wall Neuro:  AA&O x 3, mild asterixis   Labs: Basic Metabolic Panel: Recent Labs  Lab 04/23/21 1137 04/23/21 1329  NA 133* 131*  K 6.1* 5.9*  CL 99  --   CO2 12*  --   GLUCOSE 67*  --   BUN 118*  --   CREATININE 15.52*  --   ALBUMIN 3.4*  --   CALCIUM 9.6  --    Liver Function Tests: Recent Labs  Lab 04/23/21 1137  AST 18  ALT 11  ALKPHOS 80  BILITOT 1.2  PROT 7.1  ALBUMIN 3.4*   No results for input(s): LIPASE, AMYLASE in the last 168 hours. No results for input(s): AMMONIA in the last 168 hours. CBC: Recent Labs  Lab 04/23/21 1137 04/23/21 1329  WBC 6.6  --   NEUTROABS 5.3  --   HGB 12.7* 13.3  HCT 38.5* 39.0  MCV 98.0  --   PLT 143*  --    PT/INR: _1 (inr:5) Cardiac Enzymes: )No results for input(s): CKTOTAL, CKMB, CKMBINDEX, TROPONINI in the last 168 hours. CBG: Recent Labs  Lab 04/23/21 1432  GLUCAP 110*    Iron Studies: No results for input(s): IRON, TIBC, TRANSFERRIN, FERRITIN in the last 168 hours.  Xrays/Other Studies: CT ABDOMEN PELVIS WO CONTRAST  Result Date: 04/23/2021 CLINICAL DATA:  Renal failure, anuria EXAM: CT ABDOMEN AND PELVIS WITHOUT CONTRAST TECHNIQUE: Multidetector CT imaging of the abdomen and pelvis was performed following the standard protocol without IV contrast. RADIATION DOSE REDUCTION: This exam was performed according to the departmental dose-optimization program which includes automated exposure control, adjustment of the mA and/or kV according to patient size and/or use of iterative reconstruction technique. COMPARISON:  None. FINDINGS: Lower chest: Trace bilateral pleural effusions with associated compressive atelectasis. Cardiomegaly. Coronary artery atherosclerosis. Hepatobiliary: Unremarkable unenhanced appearance  of the liver. No focal liver lesion identified. Gallbladder within normal limits. No hyperdense gallstone. No biliary dilatation. Pancreas: Unremarkable. No pancreatic ductal dilatation or surrounding inflammatory changes. Spleen: Normal in size without focal abnormality. Adrenals/Urinary Tract: Unremarkable adrenal glands. 3.3 cm lower pole  left renal cyst. Kidneys are otherwise unremarkable. No renal stone or hydronephrosis. Urinary bladder is decompressed by Foley catheter. Stomach/Bowel: Small hiatal hernia. Stomach appears otherwise within normal limits. Colonic diverticulosis. No evidence of bowel wall thickening, distention, or inflammatory changes. Vascular/Lymphatic: Scattered aortoiliac atherosclerotic calcifications without aneurysm. No abdominopelvic lymphadenopathy. Reproductive: Prostatomegaly. Other: Moderate-large volume ascites. No pneumoperitoneum. No abdominal wall hernia. Musculoskeletal: Diffuse anasarca. Scattered densely sclerotic bone islands within the pelvis and spine. No acute osseous findings. IMPRESSION: 1. No evidence of obstructive uropathy. 2. Moderate-large volume ascites with diffuse anasarca. 3. Trace bilateral pleural effusions with associated compressive atelectasis. 4. Colonic diverticulosis without evidence of acute diverticulitis. 5. Prostatomegaly. 6. Aortic Atherosclerosis (ICD10-I70.0). Electronically Signed   By: Davina Poke D.O.   On: 04/23/2021 14:13   US RENAL  Result Date: 04/23/2021 CLINICAL DATA:  Acute kidney injury EXAM: RENAL / URINARY TRACT ULTRASOUND COMPLETE COMPARISON:  Same day CT. FINDINGS: Right Kidney: Renal measurements: 13.1 x 6.0 x 7.3 cm = volume: 300.4 mL. Increased renal cortical echogenicity. No hydronephrosis. Left Kidney: Renal measurements: 13.9 x 5.7 x 6.5 cm = volume: 269.0 mL. Increased renal cortical echogenicity. No hydronephrosis. There is a simple appearing cyst in the inferior pole measuring 4.3 x 3.0 x 2.8 cm. Bladder: Foley  catheter in place. Other: Bilateral pleural effusions.  Large volume ascites. IMPRESSION: Increased renal cortical echogenicity bilaterally, as can be seen in medical renal disease. No hydronephrosis. Bilateral pleural effusions and large volume ascites noted. Electronically Signed   By: Maurine Simmering M.D.   On: 04/23/2021 14:52   DG Chest Portable 1 View  Result Date: 04/23/2021 CLINICAL DATA:  81 year old male with dyspnea, atrial fibrillation. EXAM: PORTABLE CHEST - 1 VIEW COMPARISON:  None. FINDINGS: The mediastinal contours are within normal limits. Cardiomegaly. Low lung volumes. Mild bibasilar streaky opacities. No evidence of significant pleural effusion or pneumothorax. No acute osseous abnormality. IMPRESSION: Cardiomegaly with low lung volumes and bibasilar subsegmental atelectasis. Electronically Signed   By: Ruthann Cancer M.D.   On: 04/23/2021 12:35     Assessment/Plan:  Acute renal failure, anuric - appears to have started about 3 weeks ago when he noted decreased UOP but has not had any UOP for several days.  No nephrotoxic agents identified and unable to send for UA.  He has had proteinuria and hematuria in the past.  His SCr was 0.81 11 months ago and only new medication was lasix for edema.  I discussed the possible need for dialysis with he and his wife, but he is not sure about proceeding with that and would like to try IV lasix first.  Possible cardiorenal syndrome in setting of anasarca, ATN if has had nephrotic range proteinuria, although serum alb is 3.4 (only mildly decreased), or acute GN.   Will send off serologies for acute GN Will eventually need a kidney biopsy to help identify cause of ARF Start IV lasix 160 mg q6 and follow his response. Continue to hold ARB and metformin Keep foley catheter in place and follow UOP and Scr If no response to IV lasix would recommend a trial of dialysis, however pt is reluctant to proceed at this time.  Will revisit issue. Renal dose meds  for eGFR <10 Avoid nephrotoxic agents such as IV contrast, NSAIDs/Cox-II I's, and phosphate containing bowel preps (FLEETS) Anasarca - unclear etiology.  Possibly due to anuric ARF, Nephrotic syndrome, but also need ECHO to evaluate EF to r/o CHF and cardiorenal syndrome.  No history of liver disease and  albumin only mildly decreased.  IV lasix as above. Atrial fibrillation - new onset.  Asymptomatic.  Rate controlled. Hyperkalemia - was on KCl supplements at home and now with anuric ARF.  Given IV insulin/D50, lokelma 10 grams and will start IV lasix.  Per ED no peaked t-waves. Metabolic acidosis - due to ARF and metformin use.  Metformin on hold.  Will start po bicarb.   Governor Rooks Alyxandria Wentz 04/23/2021, 3:31 PM

## 2021-04-23 NOTE — Progress Notes (Signed)
BP low after paracentesis.  On Lasix drip, will order IV albumin x3 doses.

## 2021-04-23 NOTE — ED Triage Notes (Signed)
Pt reports several days of SOB, weakness, began having anuria two days ago. Noted to have new onset afib for EMS as well as increased swelling to abdomen and lower extremities which is new for patient. Denies CP, fevers, sick contacts.

## 2021-04-23 NOTE — H&P (Signed)
History and Physical    Austin Wheeler:[id number] DOB: May 16, 1940 DOA: 04/23/2021  PCP: Josetta Huddle, MD (Confirm with patient/family/NH records and if not entered, this has to be entered at [facility] point of entry) Patient coming from: Home  I have personally briefly reviewed patient's old medical records in [place]  Chief Complaint: No urine, swelling up  HPI: Austin Wheeler is a 81 y.o. male with medical history significant of HTN, IIDM, BPH, legally blind in both eyes, came with decreased urine output, swelling in abdomen bilateral lower extremities.  Patient started to have increasing bilateral lower extremities swelling about 3 months ago and was started of po Lasix 40 mg daily, swelling appears to be improving, patient however had episodes of feeling lightheadedness and "too much urination" and decided to cut down Lasix from 40 mg daily to 20 mg daily about 1 month ago.  Condition remained stable since until about 1 week ago, patient started to notice swelling in his abdomen and legs, and decreased urine output.  Finally, for the past 3 days, there has been barely any urine came out. He said he feeling "bladder fullness" when having BM, but no urine came out. No abdominal pain, no back pain, no shortness of breath, no fever or chills. No Hx of CHF or stroke.  ED Course: 1.  New A-fib on monitor, blood pressure on the lower side.  Blood work showed creatinine 15.5 compared to baseline less than 1 more than 10 months ago, BUN 18, bicarb 12, K6.1, lactic acid 3.8, albumin 3.4.  Lasix drip started by nephrology in ED.  Review of Systems: As per HPI otherwise 14 point review of systems negative.    Past Medical History:  Diagnosis Date   Blind    Diabetes mellitus without complication ([place])    Hypertension     History reviewed. No pertinent surgical history.   reports that he has never smoked. He has never used smokeless tobacco. He reports that he does not currently use  alcohol. He reports that he does not use drugs.  Allergies  Allergen Reactions   Guaifenesin Swelling and Rash    Hand and feet swelling from Robitussin   Influenza Vaccines Other (See Comments)    Caused flu-like illness   Lexapro [Escitalopram] Nausea And Vomiting    Reported by [facility] - pt does not recall    History reviewed. No pertinent family history.   Prior to Admission medications   Medication Sig Start Date End Date Taking? Authorizing Provider  acetaminophen (TYLENOL) 500 MG tablet Take 500 mg by mouth daily as needed for headache (pain).   Yes [provider]  amLODipine (NORVASC) 10 MG tablet Take 10 mg by mouth every morning. 04/11/12  Yes [provider]  aspirin EC 81 MG tablet 81 mg every morning.   Yes [provider]  atropine 1 % ophthalmic solution Place 1 drop into the right eye at bedtime. 11/11/19  Yes [provider]  brimonidine (ALPHAGAN) 0.2 % ophthalmic solution Place 1 drop into the left eye 3 (three) times daily. 02/22/21  Yes [provider]  cholecalciferol (VITAMIN D3) 25 MCG (1000 UNIT) tablet Take 1,000 Units by mouth 2 (two) times daily.   Yes [provider]  dorzolamide-timolol (COSOPT) 22.3-6.8 MG/ML ophthalmic solution Place 1 drop into the left eye 2 (two) times daily. 04/11/12  Yes [provider]  doxazosin (CARDURA) 8 MG tablet Take 1 tablet (8 mg total) by mouth daily.  Patient taking differently: Take 8 mg by mouth every morning. 11/13/20  Yes Stoioff, Ronda Fairly, MD  dutasteride (AVODART) 0.5 MG capsule Take 1 capsule (0.5 mg total) by mouth daily. Patient taking differently: Take 0.5 mg by mouth every Monday, Wednesday, and Friday. 11/13/20  Yes Stoioff, Ronda Fairly, MD  furosemide (LASIX) 40 MG tablet Take 40 mg by mouth every morning. 11/18/20  Yes [provider]  Garlic 324 MG TABS Take 500 mg by mouth every morning.   Yes [provider]  latanoprost  (XALATAN) 0.005 % ophthalmic solution Place 1 drop into the left eye at bedtime. 04/11/12  Yes [provider]  metFORMIN (GLUCOPHAGE) 500 MG tablet Take 250 mg by mouth 2 (two) times daily. 03/20/13  Yes [provider]  metoprolol tartrate (LOPRESSOR) 50 MG tablet Take 50 mg by mouth 2 (two) times daily. 05/23/14  Yes [provider]  Multiple Vitamin (MULTIVITAMIN WITH MINERALS) TABS tablet Take 1 tablet by mouth in the morning.   Yes [provider]  Multiple Vitamins-Minerals (PRESERVISION AREDS 2) CAPS Take 1 capsule by mouth 2 (two) times daily.   Yes [provider]  olmesartan-hydrochlorothiazide (BENICAR HCT) 40-12.5 MG tablet Take 1 tablet by mouth every morning. 07/16/12  Yes [provider]  OVER THE COUNTER MEDICATION Take 1 capsule by mouth See admin instructions. Go Out (OTC) - take one capsule by mouth on Tuesday and Thursday morning (for gout)   Yes [provider]  pilocarpine (PILOCAR) 4 % ophthalmic solution Place 1 drop into the left eye 3 (three) times daily. 06/20/17  Yes [provider]  potassium chloride SA (KLOR-CON M) 20 MEQ tablet Take 10 mEq by mouth every morning.   Yes [provider]  prednisoLONE acetate (PRED FORTE) 1 % ophthalmic suspension Place 1 drop into the right eye at bedtime. 11/12/19  Yes [provider]  triamcinolone ointment (KENALOG) 0.1 % Apply 1 application topically See admin instructions. Apply topically legs nightly after compression stockings are removed 11/20/20  Yes [provider]  vitamin C (ASCORBIC ACID) 500 MG tablet Take 500 mg by mouth every Saturday.   Yes [provider]  vitamin E 180 MG (400 UNITS) capsule Take 400 Units by mouth every morning.   Yes [provider]    Physical Exam: Vitals:   04/23/21 1245 04/23/21 1315 04/23/21 1430 04/23/21 1545  BP: 103/75 102/74 108/72 116/79  Pulse: 65 80 72 (!) 154  Resp: 18 17 20  20   Temp:      TempSrc:      SpO2: 94% 95% 96% 95%  Weight:      Height:        Constitutional: NAD, calm, comfortable Vitals:   04/23/21 1245 04/23/21 1315 04/23/21 1430 04/23/21 1545  BP: 103/75 102/74 108/72 116/79  Pulse: 65 80 72 (!) 154  Resp: 18 17 20 20   Temp:      TempSrc:      SpO2: 94% 95% 96% 95%  Weight:      Height:       Eyes: PERRL, lids and conjunctivae normal ENMT: Mucous membranes are moist. Posterior pharynx clear of any exudate or lesions.Normal dentition.  Neck: normal, supple, no masses, no thyromegaly Respiratory: clear to auscultation bilaterally, no wheezing, no crackles. Normal respiratory effort. No accessory muscle use.  Cardiovascular: Regular rate and rhythm, no murmurs / rubs / gallops.  Anasarca. 2+ pedal pulses. No carotid bruits.  Abdomen: Distended, positive ascites sign no  tenderness, no masses palpated. No hepatosplenomegaly. Bowel sounds positive.  Musculoskeletal: no clubbing / cyanosis. No joint deformity upper and lower extremities. Good ROM, no contractures. Normal muscle tone.  Skin: no rashes, lesions, ulcers. No induration Neurologic: CN 2-12 grossly intact. Sensation intact, DTR normal. Strength 5/5 in all 4.  Psychiatric: Normal judgment and insight. Alert and oriented x 3. Normal mood.     Labs on Admission: I have personally reviewed following labs and imaging studies  CBC: Recent Labs  Lab 04/23/21 1137 04/23/21 1329  WBC 6.6  --   NEUTROABS 5.3  --   HGB 12.7* 13.3  HCT 38.5* 39.0  MCV 98.0  --   PLT 143*  --    Basic Metabolic Panel: Recent Labs  Lab 04/23/21 1137 04/23/21 1329  NA 133* 131*  K 6.1* 5.9*  CL 99  --   CO2 12*  --   GLUCOSE 67*  --   BUN 118*  --   CREATININE 15.52*  --   CALCIUM 9.6  --    GFR: Estimated Creatinine Clearance: 5.9 mL/min (A) (by C-G formula based on SCr of 15.52 mg/dL (H)). Liver Function Tests: Recent Labs  Lab 04/23/21 1137  AST 18  ALT 11  ALKPHOS 80  BILITOT  1.2  PROT 7.1  ALBUMIN 3.4*   No results for input(s): LIPASE, AMYLASE in the last 168 hours. No results for input(s): AMMONIA in the last 168 hours. Coagulation Profile: No results for input(s): INR, PROTIME in the last 168 hours. Cardiac Enzymes: No results for input(s): CKTOTAL, CKMB, CKMBINDEX, TROPONINI in the last 168 hours. BNP (last 3 results) No results for input(s): PROBNP in the last 8760 hours. HbA1C: No results for input(s): HGBA1C in the last 72 hours. CBG: Recent Labs  Lab 04/23/21 1432  GLUCAP 110*   Lipid Profile: No results for input(s): CHOL, HDL, LDLCALC, TRIG, CHOLHDL, LDLDIRECT in the last 72 hours. Thyroid Function Tests: No results for input(s): TSH, T4TOTAL, FREET4, T3FREE, THYROIDAB in the last 72 hours. Anemia Panel: No results for input(s): VITAMINB12, FOLATE, FERRITIN, TIBC, IRON, RETICCTPCT in the last 72 hours. Urine analysis:    Component Value Date/Time   COLORURINE RED BIOCHEMICALS MAY BE AFFECTED BY COLOR (A) 02/06/2008 1846   APPEARANCEUR TURBID (A) 02/06/2008 1846   LABSPEC 1.013 02/06/2008 1846   PHURINE 5.0 02/06/2008 1846   GLUCOSEU 100 (A) 02/06/2008 1846   HGBUR LARGE (A) 02/06/2008 1846   BILIRUBINUR LARGE (A) 02/06/2008 1846   KETONESUR 40 (A) 02/06/2008 1846   PROTEINUR 100 (A) 02/06/2008 1846   UROBILINOGEN 1.0 02/06/2008 1846   NITRITE POSITIVE (A) 02/06/2008 1846   LEUKOCYTESUR LARGE (A) 02/06/2008 1846    Radiological Exams on Admission: CT ABDOMEN PELVIS WO CONTRAST  Result Date: 04/23/2021 CLINICAL DATA:  Renal failure, anuria EXAM: CT ABDOMEN AND PELVIS WITHOUT CONTRAST TECHNIQUE: Multidetector CT imaging of the abdomen and pelvis was performed following the standard protocol without IV contrast. RADIATION DOSE REDUCTION: This exam was performed according to the departmental dose-optimization program which includes automated exposure control, adjustment of the mA and/or kV according to patient size and/or use of  iterative reconstruction technique. COMPARISON:  None. FINDINGS: Lower chest: Trace bilateral pleural effusions with associated compressive atelectasis. Cardiomegaly. Coronary artery atherosclerosis. Hepatobiliary: Unremarkable unenhanced appearance of the liver. No focal liver lesion identified. Gallbladder within normal limits. No hyperdense gallstone. No biliary dilatation. Pancreas: Unremarkable. No pancreatic ductal dilatation or surrounding inflammatory changes. Spleen: Normal in size without focal abnormality. Adrenals/Urinary  Tract: Unremarkable adrenal glands. 3.3 cm lower pole left renal cyst. Kidneys are otherwise unremarkable. No renal stone or hydronephrosis. Urinary bladder is decompressed by Foley catheter. Stomach/Bowel: Small hiatal hernia. Stomach appears otherwise within normal limits. Colonic diverticulosis. No evidence of bowel wall thickening, distention, or inflammatory changes. Vascular/Lymphatic: Scattered aortoiliac atherosclerotic calcifications without aneurysm. No abdominopelvic lymphadenopathy. Reproductive: Prostatomegaly. Other: Moderate-large volume ascites. No pneumoperitoneum. No abdominal wall hernia. Musculoskeletal: Diffuse anasarca. Scattered densely sclerotic bone islands within the pelvis and spine. No acute osseous findings. IMPRESSION: 1. No evidence of obstructive uropathy. 2. Moderate-large volume ascites with diffuse anasarca. 3. Trace bilateral pleural effusions with associated compressive atelectasis. 4. Colonic diverticulosis without evidence of acute diverticulitis. 5. Prostatomegaly. 6. Aortic Atherosclerosis (ICD10-I70.0). Electronically Signed   By: Davina Poke D.O.   On: 04/23/2021 14:13   US RENAL  Result Date: 04/23/2021 CLINICAL DATA:  Acute kidney injury EXAM: RENAL / URINARY TRACT ULTRASOUND COMPLETE COMPARISON:  Same day CT. FINDINGS: Right Kidney: Renal measurements: 13.1 x 6.0 x 7.3 cm = volume: 300.4 mL. Increased renal cortical echogenicity. No  hydronephrosis. Left Kidney: Renal measurements: 13.9 x 5.7 x 6.5 cm = volume: 269.0 mL. Increased renal cortical echogenicity. No hydronephrosis. There is a simple appearing cyst in the inferior pole measuring 4.3 x 3.0 x 2.8 cm. Bladder: Foley catheter in place. Other: Bilateral pleural effusions.  Large volume ascites. IMPRESSION: Increased renal cortical echogenicity bilaterally, as can be seen in medical renal disease. No hydronephrosis. Bilateral pleural effusions and large volume ascites noted. Electronically Signed   By: Maurine Simmering M.D.   On: 04/23/2021 14:52   DG Chest Portable 1 View  Result Date: 04/23/2021 CLINICAL DATA:  81 year old male with dyspnea, atrial fibrillation. EXAM: PORTABLE CHEST - 1 VIEW COMPARISON:  None. FINDINGS: The mediastinal contours are within normal limits. Cardiomegaly. Low lung volumes. Mild bibasilar streaky opacities. No evidence of significant pleural effusion or pneumothorax. No acute osseous abnormality. IMPRESSION: Cardiomegaly with low lung volumes and bibasilar subsegmental atelectasis. Electronically Signed   By: Ruthann Cancer M.D.   On: 04/23/2021 12:35    EKG: Independently reviewed.  A-fib, no acute ST changes.  Assessment/Plan Principal Problem:   AKI (acute kidney injury) ([place])  (please populate well all problems here in Problem List. (For example, if patient is on BP meds at home and you resume or decide to hold them, it is a problem that needs to be her. Same for CAD, COPD, HLD and so on)  AKI, anuria -Secondary to ATN -With decompensated non-anion gap and anion gap metabolic acidosis, acute urea and azotemia and hyperkalemia -Nephrology consulted, patient was started on Lasix drip in ED. -D/W patient and his wife at bedside, patient is DNR/DNI, however accept HD if necessary. BP on lower side, will admit to PCU for close monitoring. -Foley for accurate I/O  Decompensated non-anion gap and anion gap metabolic acidosis -Start p.o.  bicarb  Acute uremia -No significant encephalopathy -We will start GI prophylaxis with PPI daily  Hyperkalemia -No ST-T changes on EKG, received Lokelma, will repeat BMP tonight.  New onset afib -Rate controlled -CHADS2=3, on heparin drip now. No S/S of CHF, outpatient Echo and cardiology f/u.  Elevated lactate -No significant infection sources found, chest xray clear, no diarrhea. -Hold off ABX  HTN -Hold all BP meds  IIDM -D/C Metformin -Start sliding scale  BPH -Foley for now   DVT prophylaxis: Heparin drip Code Status: DNR Family Communication: Wife at bedside Disposition Plan: Patient is sick, his AKI  may need HD, expect more than 2 midnight hospital stay Consults called: Nephrology Admission status: PCU   Lequita Halt MD [facility] Pager [phone]  04/23/2021, 4:09 PM

## 2021-04-23 NOTE — Procedures (Signed)
PROCEDURE SUMMARY:  Successful image-guided paracentesis from the right lower abdomen.  Yielded 8.3 liters of clear yellow fluid - procedure aborted at this amount due to persistent hypotension, some residual fluids remains on post procedure [place]. No immediate complications.  EBL < 1 mL Patient tolerated well.   No labs ordered by primary team.  Please see imaging section of Epic for full dictation.  Joaquim Nam PA-C 04/23/2021 4:47 PM

## 2021-04-23 NOTE — ED Provider Notes (Addendum)
[facility] EMERGENCY DEPARTMENT Provider Note   CSN: [id number] Arrival date & time: 04/23/21  1124     History Chief Complaint  Patient presents with   Shortness of Breath   Weakness   Dysuria    Austin Wheeler is a 81 y.o. male.  81 year old gentleman with past medical history significant for DM2, HTN  Patient informs me that he has no history of heart failure or heart disease or stroke.  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  Patient states that he has been more fatigued short of breath and somewhat lightheaded over the past 10 days.  He notes that he has developed lower extremity swelling and abdominal swelling.  He states that over the past 2 days he has barely produced any urine primarily peeing only when he is having a BM.  Normally pee several times a day.  He also tells me that he is on potassium supplements and states that he takes a fluid pill seems that he takes Lasix 40 daily.      Home Medications Prior to Admission medications   Medication Sig Start Date End Date Taking? Authorizing Provider  acetaminophen (TYLENOL) 500 MG tablet Take 500 mg by mouth daily as needed for headache (pain).   Yes [provider]  amLODipine (NORVASC) 10 MG tablet Take 10 mg by mouth every morning. 04/11/12  Yes [provider]  aspirin EC 81 MG tablet 81 mg every morning.   Yes [provider]  atropine 1 % ophthalmic solution Place 1 drop into the right eye at bedtime. 11/11/19  Yes [provider]  brimonidine (ALPHAGAN) 0.2 % ophthalmic solution Place 1 drop into the left eye 3 (three) times daily. 02/22/21  Yes [provider]  cholecalciferol (VITAMIN D3) 25 MCG (1000 UNIT) tablet Take 1,000 Units by mouth 2 (two) times daily.   Yes [provider]  dorzolamide-timolol (COSOPT) 22.3-6.8 MG/ML ophthalmic solution  Place 1 drop into the left eye 2 (two) times daily. 04/11/12  Yes [provider]  doxazosin (CARDURA) 8 MG tablet Take 1 tablet (8 mg total) by mouth daily. Patient taking differently: Take 8 mg by mouth every morning. 11/13/20  Yes Stoioff, Ronda Fairly, MD  dutasteride (AVODART) 0.5 MG capsule Take 1 capsule (0.5 mg total) by mouth daily. Patient taking differently: Take 0.5 mg by mouth every Monday, Wednesday, and Friday. 11/13/20  Yes Stoioff, Ronda Fairly, MD  furosemide (LASIX) 40 MG tablet Take 40 mg by mouth every morning. 11/18/20  Yes [provider]  Garlic 540 MG TABS Take 500 mg by mouth every morning.   Yes [provider]  latanoprost (XALATAN) 0.005 % ophthalmic solution Place 1 drop into the left eye at bedtime. 04/11/12  Yes [provider]  metFORMIN (GLUCOPHAGE) 500 MG tablet Take 250 mg by mouth 2 (two) times daily. 03/20/13  Yes [provider]  metoprolol tartrate (LOPRESSOR) 50 MG tablet Take 50 mg by mouth 2 (two) times daily. 05/23/14  Yes [provider]  Multiple Vitamin (MULTIVITAMIN WITH MINERALS) TABS tablet Take 1 tablet by mouth in the morning.   Yes [provider]  Multiple Vitamins-Minerals (PRESERVISION AREDS 2) CAPS Take 1 capsule by mouth 2 (two) times daily.   Yes [provider]  olmesartan-hydrochlorothiazide (BENICAR HCT) 40-12.5 MG tablet Take 1 tablet by mouth every morning. 07/16/12  Yes [provider]  OVER THE COUNTER MEDICATION Take 1 capsule by mouth See admin instructions. Go Out (OTC) - take one capsule by mouth on Tuesday and Thursday morning (for gout)   Yes [provider]  pilocarpine (PILOCAR) 4 % ophthalmic solution Place 1 drop into the left eye 3 (three) times daily. 06/20/17  Yes [provider]  potassium chloride SA (KLOR-CON M) 20 MEQ tablet Take 10 mEq by mouth every morning.   Yes [provider]  prednisoLONE acetate (PRED FORTE) 1 % ophthalmic  suspension Place 1 drop into the right eye at bedtime. 11/12/19  Yes [provider]  triamcinolone ointment (KENALOG) 0.1 % Apply 1 application topically See admin instructions. Apply topically legs nightly after compression stockings are removed 11/20/20  Yes [provider]  vitamin C (ASCORBIC ACID) 500 MG tablet Take 500 mg by mouth every Saturday.   Yes [provider]  vitamin E 180 MG (400 UNITS) capsule Take 400 Units by mouth every morning.   Yes [provider]      Allergies    Guaifenesin, Influenza vaccines, and Lexapro [escitalopram]    Review of Systems   Review of Systems  Respiratory:  Positive for shortness of breath.   Genitourinary:  Positive for enuresis. Negative for dysuria.  Neurological:  Positive for weakness.   Physical Exam Updated Vital Signs BP 108/72    Pulse 72    Temp 97.6 F (36.4 C) (Oral)    Resp 20    Ht 6\' 6"  (1.981 m)    Wt 134.7 kg    SpO2 96%    BMI 34.32 kg/m  Physical Exam Vitals and nursing note reviewed.  Constitutional:      General: He is not in acute distress. HENT:     Head: Normocephalic and atraumatic.     Nose: Nose normal.  Eyes:     General: No scleral icterus. Neck:     Comments: JVP Cardiovascular:     Rate and Rhythm: Normal rate and regular rhythm.     Pulses: Normal pulses.     Heart sounds: Normal heart sounds.  Pulmonary:     Effort: Pulmonary effort is normal. No respiratory distress.     Breath sounds: No wheezing.  Abdominal:     Palpations: Abdomen is soft.     Tenderness: There is no abdominal tenderness. There is no guarding or rebound.     Comments: Anasarca  Musculoskeletal:     Cervical back: Normal range of motion.     Right lower leg: Edema present.     Left lower leg: Edema present.     Comments: Significant lower extremity pitting edema.  Symmetric  Skin:    General: Skin is warm and dry.     Capillary Refill: Capillary refill takes less than 2 seconds.   Neurological:     Mental Status: He is alert. Mental status is at baseline.  Psychiatric:        Mood and Affect: Mood normal.        Behavior: Behavior normal.    ED Results / Procedures / Treatments   Labs (all labs ordered are listed, but only abnormal results are displayed) Labs Reviewed  COMPREHENSIVE METABOLIC PANEL - Abnormal; Notable for the following components:      Result Value   Sodium 133 (*)    Potassium 6.1 (*)    CO2 12 (*)    Glucose, Bld 67 (*)    BUN 118 (*)  Creatinine, Ser 15.52 (*)    Albumin 3.4 (*)    GFR, Estimated 3 (*)    Anion gap 22 (*)    All other components within normal limits  CBC WITH DIFFERENTIAL/PLATELET - Abnormal; Notable for the following components:   RBC 3.93 (*)    Hemoglobin 12.7 (*)    HCT 38.5 (*)    Platelets 143 (*)    Lymphs Abs 0.5 (*)    All other components within normal limits  BRAIN NATRIURETIC PEPTIDE - Abnormal; Notable for the following components:   B Natriuretic Peptide 1,183.2 (*)    All other components within normal limits  LACTIC ACID, PLASMA - Abnormal; Notable for the following components:   Lactic Acid, Venous 3.8 (*)    All other components within normal limits  CBG MONITORING, ED - Abnormal; Notable for the following components:   Glucose-Capillary 110 (*)    All other components within normal limits  I-STAT VENOUS BLOOD GAS, ED - Abnormal; Notable for the following components:   pH, Ven 7.245 (*)    pCO2, Ven 26.9 (*)    pO2, Ven 73 (*)    Bicarbonate 11.7 (*)    TCO2 12 (*)    Acid-base deficit 14.0 (*)    Sodium 131 (*)    Potassium 5.9 (*)    All other components within normal limits  RESP PANEL BY RT-PCR (FLU A&B, COVID) ARPGX2  URINALYSIS, ROUTINE W REFLEX MICROSCOPIC  LACTIC ACID, PLASMA  PROTIME-INR  HEPARIN LEVEL (UNFRACTIONATED)  ANTINUCLEAR ANTIBODIES, IFA  C3 COMPLEMENT  C4 COMPLEMENT  COMPLEMENT, TOTAL  GLOMERULAR BASEMENT MEMBRANE ANTIBODIES  ANTISTREPTOLYSIN O TITER   ANTI-DNA ANTIBODY, DOUBLE-STRANDED  URINALYSIS, COMPLETE (UACMP) WITH MICROSCOPIC  SODIUM, URINE, RANDOM  CREATININE, URINE, RANDOM  PROTEIN / CREATININE RATIO, URINE  IMMUNOFIXATION, URINE  TROPONIN I (HIGH SENSITIVITY)  TROPONIN I (HIGH SENSITIVITY)    EKG EKG Interpretation  Date/Time:  Friday April 23 2021 11:26:33 EST Ventricular Rate:  85 PR Interval:    QRS Duration: 118 QT Interval:  382 QTC Calculation: 455 R Axis:   105 Text Interpretation: Atrial fibrillation Left posterior fascicular block Confirmed by [place], [place] ([id number]) on 04/23/2021 11:29:17 AM  Radiology CT ABDOMEN PELVIS WO CONTRAST  Result Date: 04/23/2021 CLINICAL DATA:  Renal failure, anuria EXAM: CT ABDOMEN AND PELVIS WITHOUT CONTRAST TECHNIQUE: Multidetector CT imaging of the abdomen and pelvis was performed following the standard protocol without IV contrast. RADIATION DOSE REDUCTION: This exam was performed according to the departmental dose-optimization program which includes automated exposure control, adjustment of the mA and/or kV according to patient size and/or use of iterative reconstruction technique. COMPARISON:  None. FINDINGS: Lower chest: Trace bilateral pleural effusions with associated compressive atelectasis. Cardiomegaly. Coronary artery atherosclerosis. Hepatobiliary: Unremarkable unenhanced appearance of the liver. No focal liver lesion identified. Gallbladder within normal limits. No hyperdense gallstone. No biliary dilatation. Pancreas: Unremarkable. No pancreatic ductal dilatation or surrounding inflammatory changes. Spleen: Normal in size without focal abnormality. Adrenals/Urinary Tract: Unremarkable adrenal glands. 3.3 cm lower pole left renal cyst. Kidneys are otherwise unremarkable. No renal stone or hydronephrosis. Urinary bladder is decompressed by Foley catheter. Stomach/Bowel: Small hiatal hernia. Stomach appears otherwise within normal limits. Colonic diverticulosis. No evidence of  bowel wall thickening, distention, or inflammatory changes. Vascular/Lymphatic: Scattered aortoiliac atherosclerotic calcifications without aneurysm. No abdominopelvic lymphadenopathy. Reproductive: Prostatomegaly. Other: Moderate-large volume ascites. No pneumoperitoneum. No abdominal wall hernia. Musculoskeletal: Diffuse anasarca. Scattered densely sclerotic bone islands within the pelvis and spine. No acute osseous findings. IMPRESSION:  1. No evidence of obstructive uropathy. 2. Moderate-large volume ascites with diffuse anasarca. 3. Trace bilateral pleural effusions with associated compressive atelectasis. 4. Colonic diverticulosis without evidence of acute diverticulitis. 5. Prostatomegaly. 6. Aortic Atherosclerosis (ICD10-I70.0). Electronically Signed   By: Davina Poke D.O.   On: 04/23/2021 14:13   US RENAL  Result Date: 04/23/2021 CLINICAL DATA:  Acute kidney injury EXAM: RENAL / URINARY TRACT ULTRASOUND COMPLETE COMPARISON:  Same day CT. FINDINGS: Right Kidney: Renal measurements: 13.1 x 6.0 x 7.3 cm = volume: 300.4 mL. Increased renal cortical echogenicity. No hydronephrosis. Left Kidney: Renal measurements: 13.9 x 5.7 x 6.5 cm = volume: 269.0 mL. Increased renal cortical echogenicity. No hydronephrosis. There is a simple appearing cyst in the inferior pole measuring 4.3 x 3.0 x 2.8 cm. Bladder: Foley catheter in place. Other: Bilateral pleural effusions.  Large volume ascites. IMPRESSION: Increased renal cortical echogenicity bilaterally, as can be seen in medical renal disease. No hydronephrosis. Bilateral pleural effusions and large volume ascites noted. Electronically Signed   By: Maurine Simmering M.D.   On: 04/23/2021 14:52   DG Chest Portable 1 View  Result Date: 04/23/2021 CLINICAL DATA:  81 year old male with dyspnea, atrial fibrillation. EXAM: PORTABLE CHEST - 1 VIEW COMPARISON:  None. FINDINGS: The mediastinal contours are within normal limits. Cardiomegaly. Low lung volumes. Mild  bibasilar streaky opacities. No evidence of significant pleural effusion or pneumothorax. No acute osseous abnormality. IMPRESSION: Cardiomegaly with low lung volumes and bibasilar subsegmental atelectasis. Electronically Signed   By: Ruthann Cancer M.D.   On: 04/23/2021 12:35    Procedures Ultrasound ED Abd  Date/Time: 04/23/2021 5:18 PM Performed by: Tedd Sias, PA Authorized by: Tedd Sias, PA   Procedure details:    Indications: decreased urinary output     Assessment for:  Intra-abdominal fluid and hydronephrosis   Bladder:  Not visualized        Comments:     Significant ascites .Critical Care Performed by: Tedd Sias, PA Authorized by: Tedd Sias, PA   Critical care provider statement:    Critical care time (minutes):  30   Critical care was necessary to treat or prevent imminent or life-threatening deterioration of the following conditions:  Metabolic crisis and renal failure   Critical care was time spent personally by me on the following activities:  Development of treatment plan with patient or surrogate, discussions with consultants, evaluation of patient's response to treatment, examination of patient, ordering and review of laboratory studies, ordering and review of radiographic studies, ordering and performing treatments and interventions, pulse oximetry, re-evaluation of patient's condition and review of old charts        Medications Ordered in ED Medications  sodium zirconium cyclosilicate (LOKELMA) packet 10 g (10 g Oral Given 04/23/21 1450)  heparin ADULT infusion 100 units/mL (25000 units/257mL) (has no administration in time range)  dextrose 50 % solution 100 mL (100 mLs Intravenous Given 04/23/21 1315)  insulin aspart (novoLOG) injection 5 Units (5 Units Intravenous Given 04/23/21 1320)  calcium gluconate 1 g/ 50 mL sodium chloride IVPB (0 mg Intravenous Stopped 04/23/21 1345)    ED Course/ Medical Decision Making/ A&P Clinical Course  as of 04/23/21 1620  Fri Apr 23, 2021  1342 I-STAT VBG notable for acidosis low bicarb consistent with metabolic acidosis  Renal failure of unknown origin.  Will discuss with nephrology.  Hyperkalemia of 6.1 with no EKG changes noted to be hypoglycemic we will give 2 A of D50 and 5 units of  insulin along with calcium gluconate for stabilization.  Mild hyponatremia may be due to hypervolemia. [WF]  1430 Discussed w/ [place] who will see - renal [WF]  1542 Dr. Roosevelt Locks to admit [WF]    Clinical Course User Index [WF] Tedd Sias, PA                           Medical Decision Making Amount and/or Complexity of Data Reviewed Labs: ordered. Radiology: ordered.  Risk Prescription drug management. Decision regarding hospitalization.    This patient presents to the ED for concern of anuria, weakness, fatigue, this involves a number of treatment options, and is a complaint that carries with it a high risk of complications and morbidity.  The differential diagnosis includes The differential diagnosis of weakness includes but is not limited to neurologic causes (GBS, myasthenia gravis, CVA, MS, ALS, transverse myelitis, spinal cord injury, CVA, botulism, ) and other causes: ACS, Arrhythmia, syncope, orthostatic hypotension, sepsis, hypoglycemia, electrolyte disturbance, hypothyroidism, respiratory failure, symptomatic anemia, dehydration, heat injury, polypharmacy, malignancy.   Co morbidities: Discussed in HPI   Brief History:  81 year old gentleman with past medical history significant for DM2, HTN  Patient informs me that he has no history of heart failure or heart disease or stroke.  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  Patient states that he has been more fatigued short of breath and somewhat lightheaded over the past 10 days.  He notes that he has developed lower extremity swelling  and abdominal swelling.  He states that over the past 2 days he has barely produced any urine primarily peeing only when he is having a BM.  Normally pee several times a day.  He also tells me that he is on potassium supplements and states that he takes a fluid pill seems that he takes Lasix 40 daily.  EMR reviewed including pt PMHx, past surgical history and past visits to ER.   See HPI for more details   Lab Tests/imaging  I ordered and independently interpreted labs.  The pertinent results include:    Labs notable for potassium of 6.1 without EKG changes.  Creatinine jumped from 0.8-15.5 to seemingly over the past few months.  CBC with mild anemia.  BNP significantly elevated no comparison however patient does appear quite volume overloaded.  Could be due to renal failure, heart failure or combination of the 2.  Cardiorenal syndrome not excluded.  Troponin within normal limits.  Without chest pain.  CT abdomen pelvis shows significant ascites which was initially found on my bedside ultrasound.  Chest x-ray with evidence of some cardiomegaly.  Renal ultrasound with some parenchymal changes.  I reviewed all of these images personally.  Paracentesis was conducted by interventional radiology.    Cardiac Monitoring:  The patient was maintained on a cardiac monitor.  I personally viewed and interpreted the cardiac monitored which showed an underlying rhythm of: Atrial fibrillation EKG non-ischemic   Medicines ordered:  I ordered medication including calcium for hyperK also given 2 A of D50 and 5 units of insulin.  We will also place on [place] for hyperkalemia.  Will defer to nephrology for Lasix administration. Reevaluation of the patient after these medicines showed that the patient stayed the same I have reviewed the patients home medicines and have made adjustments as needed   Critical Interventions:  Treatment of  hyperkalemia, nephrology consultation  Given the patient has a  CHA2DS2-VASc score of 4 will anticoagulate with heparin for admission.   Consults:  I requested consultation with nephrology,  and discussed lab and imaging findings as well as pertinent plan - they recommend: Treatment of hyperkalemia and will see patient make a formal consult note.    Reevaluation:  After the interventions noted above I re-evaluated patient and found that they have :stayed the same   Social Determinants of Health:  The patient's social determinants of health were a factor in the care of this patient    Problem List / ED Course:  CHF Kidney failure New A-fib Borderline hypotension Anasarca   Dispostion:  After consideration of the diagnostic results and the patients response to treatment, I feel that the patent would benefit from   Patient has multiple reasons for admission today.  Ultimately placed on heparin which was ordered by me for his new onset A-fib also has significant renal failure and nephrology is considering dialysis.  Also has anasarca this could be multifactorial due to kidney, liver and or heart failure.   Final Clinical Impression(s) / ED Diagnoses Final diagnoses:  AKI (acute kidney injury) ([place])  Hyperkalemia  Lower extremity edema    Rx / DC Orders ED Discharge Orders     None         Tedd Sias, [place] 04/23/21 1850    Teressa Lower, MD 04/24/21 0726    Tedd Sias, PA 04/24/21 1655    Teressa Lower, MD 04/24/21 1452

## 2021-04-24 ENCOUNTER — Inpatient Hospital Stay (HOSPITAL_COMMUNITY): Payer: Medicare PPO

## 2021-04-24 DIAGNOSIS — I5031 Acute diastolic (congestive) heart failure: Secondary | ICD-10-CM | POA: Diagnosis not present

## 2021-04-24 DIAGNOSIS — N179 Acute kidney failure, unspecified: Secondary | ICD-10-CM | POA: Diagnosis not present

## 2021-04-24 LAB — CBC
HCT: 38 % — ABNORMAL LOW (ref 39.0–52.0)
Hemoglobin: 12.5 g/dL — ABNORMAL LOW (ref 13.0–17.0)
MCH: 32.1 pg (ref 26.0–34.0)
MCHC: 32.9 g/dL (ref 30.0–36.0)
MCV: 97.7 fL (ref 80.0–100.0)
Platelets: 109 10*3/uL — ABNORMAL LOW (ref 150–400)
RBC: 3.89 MIL/uL — ABNORMAL LOW (ref 4.22–5.81)
RDW: 15.1 % (ref 11.5–15.5)
WBC: 6 10*3/uL (ref 4.0–10.5)
nRBC: 0 % (ref 0.0–0.2)

## 2021-04-24 LAB — ECHOCARDIOGRAM COMPLETE
Area-P 1/2: 3.42 cm2
Height: 78 in
S' Lateral: 4.6 cm
Weight: 4310.43 oz

## 2021-04-24 LAB — RENAL FUNCTION PANEL
Albumin: 3.3 g/dL — ABNORMAL LOW (ref 3.5–5.0)
Anion gap: 19 — ABNORMAL HIGH (ref 5–15)
BUN: 120 mg/dL — ABNORMAL HIGH (ref 8–23)
CO2: 13 mmol/L — ABNORMAL LOW (ref 22–32)
Calcium: 9 mg/dL (ref 8.9–10.3)
Chloride: 100 mmol/L (ref 98–111)
Creatinine, Ser: 16.12 mg/dL — ABNORMAL HIGH (ref 0.61–1.24)
GFR, Estimated: 3 mL/min — ABNORMAL LOW (ref >60)
Glucose, Bld: 107 mg/dL — ABNORMAL HIGH (ref 70–99)
Phosphorus: 6.2 mg/dL — ABNORMAL HIGH (ref 2.5–4.6)
Potassium: 5.7 mmol/L — ABNORMAL HIGH (ref 3.5–5.1)
Sodium: 132 mmol/L — ABNORMAL LOW (ref 135–145)

## 2021-04-24 LAB — BASIC METABOLIC PANEL
Anion gap: 19 — ABNORMAL HIGH (ref 5–15)
BUN: 122 mg/dL — ABNORMAL HIGH (ref 8–23)
CO2: 13 mmol/L — ABNORMAL LOW (ref 22–32)
Calcium: 9.3 mg/dL (ref 8.9–10.3)
Chloride: 100 mmol/L (ref 98–111)
Creatinine, Ser: 16.13 mg/dL — ABNORMAL HIGH (ref 0.61–1.24)
GFR, Estimated: 3 mL/min — ABNORMAL LOW (ref >60)
Glucose, Bld: 106 mg/dL — ABNORMAL HIGH (ref 70–99)
Potassium: 5.7 mmol/L — ABNORMAL HIGH (ref 3.5–5.1)
Sodium: 132 mmol/L — ABNORMAL LOW (ref 135–145)

## 2021-04-24 LAB — GLUCOSE, CAPILLARY
Glucose-Capillary: 101 mg/dL — ABNORMAL HIGH (ref 70–99)
Glucose-Capillary: 105 mg/dL — ABNORMAL HIGH (ref 70–99)
Glucose-Capillary: 87 mg/dL (ref 70–99)
Glucose-Capillary: 87 mg/dL (ref 70–99)

## 2021-04-24 MED ORDER — ALBUMIN HUMAN 25 % IV SOLN
INTRAVENOUS | Status: AC
  Filled 2021-04-24: qty 50

## 2021-04-24 MED ORDER — CHLORHEXIDINE GLUCONATE CLOTH 2 % EX PADS
6.0000 | MEDICATED_PAD | Freq: Every day | CUTANEOUS | Status: DC
  Administered 2021-04-24: 6 via TOPICAL

## 2021-04-24 MED ORDER — METOLAZONE 2.5 MG PO TABS
10.0000 mg | ORAL_TABLET | Freq: Every day | ORAL | Status: DC
Start: 2021-04-24 — End: 2021-04-26
  Administered 2021-04-24 – 2021-04-25 (×2): 10 mg via ORAL
  Filled 2021-04-24 (×2): qty 2

## 2021-04-24 NOTE — TOC Progression Note (Signed)
Transition of Care [facility]) - Progression Note    Patient Details  Name: [name] MRN: [id number] Date of Birth: 1940-07-01  Transition [facility]) CM/SW Contact  Zenon Mayo, RN Phone Number: 04/24/2021, 4:06 PM  Clinical Narrative:     Transition of Care [facility]) Screening Note   Patient Details  Name: [name] Date of Birth: April 10, 1940   Transition of Care [facility]) CM/SW Contact:    Zenon Mayo, RN Phone Number: 04/24/2021, 4:06 PM    Transition of Care Department [facility]) has reviewed patient and no TOC needs have been identified at this time. We will continue to monitor patient advancement through interdisciplinary progression rounds. If new patient transition needs arise, please place a TOC consult.          Expected Discharge Plan and Services                                                 Social Determinants of Health (SDOH) Interventions    Readmission Risk Interventions No flowsheet data found.

## 2021-04-24 NOTE — Progress Notes (Signed)
Noted with low urine output ,color bloody, no clots noted..patient denies any pain nor any abd'l discomforts.Md on call notified , Heparin discontinued per Dr Marlyce Huge. Will Continue to monitor urine , ordered to flush catheter hourly and prn. No changes in vital sign noted.

## 2021-04-24 NOTE — Progress Notes (Signed)
[place] for Heparin (on hold as of 2/25) Indication: atrial fibrillation  Allergies  Allergen Reactions   Guaifenesin Swelling and Rash    Hand and feet swelling from Robitussin   Influenza Vaccines Other (See Comments)    Caused flu-like illness   Lexapro [Escitalopram] Nausea And Vomiting    Reported by [facility] - pt does not recall    Patient Measurements: Height: 6\' 6"  (198.1 cm) Weight: 122.2 kg (269 lb 6.4 oz) IBW/kg (Calculated) : 91.4 Heparin Dosing Weight: 120.4 kg  Vital Signs: Temp: 97.8 F (36.6 C) (02/25 0728) Temp Source: Axillary (02/25 0728) BP: 108/67 (02/25 0728) Pulse Rate: 77 (02/25 0728)  Labs: Recent Labs    04/23/21 1137 04/23/21 1329 04/23/21 1333 04/23/21 2021 04/24/21 0344 04/24/21 0546  HGB 12.7* 13.3  --   --  12.5*  --   HCT 38.5* 39.0  --   --  38.0*  --   PLT 143*  --   --   --  109*  --   LABPROT  --   --   --  18.7*  --   --   INR  --   --   --  1.6*  --   --   HEPARINUNFRC  --   --   --  0.30  --   --   CREATININE 15.52*  --   --  16.13*  --  16.12*  TROPONINIHS 17  --  16  --   --   --      Estimated Creatinine Clearance: 5.5 mL/min (A) (by C-G formula based on SCr of 16.12 mg/dL (H)).   Medical History: Past Medical History:  Diagnosis Date   Blind    Diabetes mellitus without complication ([place])    Hypertension     Medications:  Medications Prior to Admission  Medication Sig Dispense Refill Last Dose   acetaminophen (TYLENOL) 500 MG tablet Take 500 mg by mouth daily as needed for headache (pain).   week ago   amLODipine (NORVASC) 10 MG tablet Take 10 mg by mouth every morning.   04/22/2021   aspirin EC 81 MG tablet 81 mg every morning.   04/22/2021   atropine 1 % ophthalmic solution Place 1 drop into the right eye at bedtime.   04/22/2021   brimonidine (ALPHAGAN) 0.2 % ophthalmic solution Place 1 drop into the left eye 3 (three) times daily.   04/22/2021 at pm    cholecalciferol (VITAMIN D3) 25 MCG (1000 UNIT) tablet Take 1,000 Units by mouth 2 (two) times daily.   04/22/2021 at pm   dorzolamide-timolol (COSOPT) 22.3-6.8 MG/ML ophthalmic solution Place 1 drop into the left eye 2 (two) times daily.   04/22/2021 at 2000   doxazosin (CARDURA) 8 MG tablet Take 1 tablet (8 mg total) by mouth daily. (Patient taking differently: Take 8 mg by mouth every morning.) 90 tablet 3 04/22/2021   dutasteride (AVODART) 0.5 MG capsule Take 1 capsule (0.5 mg total) by mouth daily. (Patient taking differently: Take 0.5 mg by mouth every Monday, Wednesday, and Friday.) 90 capsule 3 04/21/2021   furosemide (LASIX) 40 MG tablet Take 40 mg by mouth every morning.   09/03/2374   Garlic 283 MG TABS Take 500 mg by mouth every morning.   04/22/2021   latanoprost (XALATAN) 0.005 % ophthalmic solution Place 1 drop into the left eye at bedtime.   04/22/2021 at pm   metFORMIN (GLUCOPHAGE) 500 MG tablet Take 250 mg  by mouth 2 (two) times daily.   04/22/2021 at pm   metoprolol tartrate (LOPRESSOR) 50 MG tablet Take 50 mg by mouth 2 (two) times daily.   04/22/2021 at 2000   Multiple Vitamin (MULTIVITAMIN WITH MINERALS) TABS tablet Take 1 tablet by mouth in the morning.   04/22/2021   Multiple Vitamins-Minerals (PRESERVISION AREDS 2) CAPS Take 1 capsule by mouth 2 (two) times daily.   04/22/2021 at pm   olmesartan-hydrochlorothiazide (BENICAR HCT) 40-12.5 MG tablet Take 1 tablet by mouth every morning.   04/22/2021   OVER THE COUNTER MEDICATION Take 1 capsule by mouth See admin instructions. Go Out (OTC) - take one capsule by mouth on Tuesday and Thursday morning (for gout)   04/22/2021   pilocarpine (PILOCAR) 4 % ophthalmic solution Place 1 drop into the left eye 3 (three) times daily.  11 04/22/2021 at pm   potassium chloride SA (KLOR-CON M) 20 MEQ tablet Take 10 mEq by mouth every morning.   04/22/2021   prednisoLONE acetate (PRED FORTE) 1 % ophthalmic suspension Place 1 drop into the right eye at bedtime.    04/22/2021   triamcinolone ointment (KENALOG) 0.1 % Apply 1 application topically See admin instructions. Apply topically legs nightly after compression stockings are removed   04/22/2021   vitamin C (ASCORBIC ACID) 500 MG tablet Take 500 mg by mouth every Saturday.   04/17/2021   vitamin E 180 MG (400 UNITS) capsule Take 400 Units by mouth every morning.   04/22/2021   Scheduled:   aspirin EC  81 mg Oral q morning   atropine  1 drop Right Eye QHS   brimonidine  1 drop Left Eye TID   Chlorhexidine Gluconate Cloth  6 each Topical Q0600   dorzolamide-timolol  1 drop Left Eye BID   insulin aspart  0-6 Units Subcutaneous TID WC   latanoprost  1 drop Left Eye QHS   metolazone  10 mg Oral Daily   pantoprazole  40 mg Oral Daily   pilocarpine  1 drop Left Eye TID   prednisoLONE acetate  1 drop Right Eye QHS   sodium bicarbonate  1,300 mg Oral BID   sodium zirconium cyclosilicate  10 g Oral Daily   triamcinolone ointment  1 application Topical Daily   Infusions:   albumin human 25 g (04/24/21 0253)   furosemide 160 mg (04/24/21 0340)   PRN:   Assessment: 10 yom presenting with SOB, weakness, dysuria. Heparin per pharmacy consult placed for new onset atrial fibrillation. Patient is not on anticoagulation prior to arrival.  Heparin was temporarily held this morning due to worsening hematuria overnight. Per day shift RN, urine appears to be lighter in color compared to report given this morning. Will continue to monitor.   Goal of Therapy:  Heparin level 0.3-0.7 units/ml Monitor platelets by anticoagulation protocol: Yes   Plan:  Continue to hold heparin given hematuria  Monitor for further s/sx of bleeding F/up restarting anticoagulation   Joseph Art, Pharm.D. PGY-1 Pharmacy Resident [phone] 04/24/2021 8:34 AM

## 2021-04-24 NOTE — Progress Notes (Signed)
HOSPITAL MEDICINE OVERNIGHT EVENT NOTE    Notified by nursing that patient has been experiencing worsening hematuria over the next 1 to 2 hours.  Patient has periodically required a flushing of the Foley catheter to allow for free flow of urine low there are no obvious clots according to nursing.  Of note, patient has just been started on a heparin infusion for new diagnosis of atrial fibrillation and chadsVasc score of 3 according to the admitting providers H&P.  We will temporarily discontinue heparin.  I have additionally asked nursing to regularly flush the Foley catheter over the next several hours.  If hematuria does not spontaneously resolve, will likely need to contact urology in the morning for formal urologic consultation.  Options for anticoagulation for atrial fibrillation will need to be reviewed by day team.  Vernelle Emerald  MD [facility]

## 2021-04-24 NOTE — Progress Notes (Signed)
[place] for Heparin Indication: atrial fibrillation  Allergies  Allergen Reactions   Guaifenesin Swelling and Rash    Hand and feet swelling from Robitussin   Influenza Vaccines Other (See Comments)    Caused flu-like illness   Lexapro [Escitalopram] Nausea And Vomiting    Reported by [facility] - pt does not recall    Patient Measurements: Height: 6\' 6"  (198.1 cm) Weight: 122.2 kg (269 lb 6.4 oz) IBW/kg (Calculated) : 91.4 Heparin Dosing Weight: 120.4 kg  Vital Signs: Temp: 97.6 F (36.4 C) (02/25 0018) Temp Source: Axillary (02/25 0018) BP: 105/61 (02/25 0018) Pulse Rate: 63 (02/25 0018)  Labs: Recent Labs    04/23/21 1137 04/23/21 1329 04/23/21 1333 04/23/21 2021  HGB 12.7* 13.3  --   --   HCT 38.5* 39.0  --   --   PLT 143*  --   --   --   LABPROT  --   --   --  18.7*  INR  --   --   --  1.6*  HEPARINUNFRC  --   --   --  0.30  CREATININE 15.52*  --   --  16.13*  TROPONINIHS 17  --  16  --      Estimated Creatinine Clearance: 5.4 mL/min (A) (by C-G formula based on SCr of 16.13 mg/dL (H)).   Medical History: Past Medical History:  Diagnosis Date   Blind    Diabetes mellitus without complication ([place])    Hypertension     Medications:  Medications Prior to Admission  Medication Sig Dispense Refill Last Dose   acetaminophen (TYLENOL) 500 MG tablet Take 500 mg by mouth daily as needed for headache (pain).   week ago   amLODipine (NORVASC) 10 MG tablet Take 10 mg by mouth every morning.   04/22/2021   aspirin EC 81 MG tablet 81 mg every morning.   04/22/2021   atropine 1 % ophthalmic solution Place 1 drop into the right eye at bedtime.   04/22/2021   brimonidine (ALPHAGAN) 0.2 % ophthalmic solution Place 1 drop into the left eye 3 (three) times daily.   04/22/2021 at pm   cholecalciferol (VITAMIN D3) 25 MCG (1000 UNIT) tablet Take 1,000 Units by mouth 2 (two) times daily.   04/22/2021 at pm   dorzolamide-timolol  (COSOPT) 22.3-6.8 MG/ML ophthalmic solution Place 1 drop into the left eye 2 (two) times daily.   04/22/2021 at 2000   doxazosin (CARDURA) 8 MG tablet Take 1 tablet (8 mg total) by mouth daily. (Patient taking differently: Take 8 mg by mouth every morning.) 90 tablet 3 04/22/2021   dutasteride (AVODART) 0.5 MG capsule Take 1 capsule (0.5 mg total) by mouth daily. (Patient taking differently: Take 0.5 mg by mouth every Monday, Wednesday, and Friday.) 90 capsule 3 04/21/2021   furosemide (LASIX) 40 MG tablet Take 40 mg by mouth every morning.   05/27/760   Garlic 263 MG TABS Take 500 mg by mouth every morning.   04/22/2021   latanoprost (XALATAN) 0.005 % ophthalmic solution Place 1 drop into the left eye at bedtime.   04/22/2021 at pm   metFORMIN (GLUCOPHAGE) 500 MG tablet Take 250 mg by mouth 2 (two) times daily.   04/22/2021 at pm   metoprolol tartrate (LOPRESSOR) 50 MG tablet Take 50 mg by mouth 2 (two) times daily.   04/22/2021 at 2000   Multiple Vitamin (MULTIVITAMIN WITH MINERALS) TABS tablet Take 1 tablet by mouth in the  morning.   04/22/2021   Multiple Vitamins-Minerals (PRESERVISION AREDS 2) CAPS Take 1 capsule by mouth 2 (two) times daily.   04/22/2021 at pm   olmesartan-hydrochlorothiazide (BENICAR HCT) 40-12.5 MG tablet Take 1 tablet by mouth every morning.   04/22/2021   OVER THE COUNTER MEDICATION Take 1 capsule by mouth See admin instructions. Go Out (OTC) - take one capsule by mouth on Tuesday and Thursday morning (for gout)   04/22/2021   pilocarpine (PILOCAR) 4 % ophthalmic solution Place 1 drop into the left eye 3 (three) times daily.  11 04/22/2021 at pm   potassium chloride SA (KLOR-CON M) 20 MEQ tablet Take 10 mEq by mouth every morning.   04/22/2021   prednisoLONE acetate (PRED FORTE) 1 % ophthalmic suspension Place 1 drop into the right eye at bedtime.   04/22/2021   triamcinolone ointment (KENALOG) 0.1 % Apply 1 application topically See admin instructions. Apply topically legs nightly after  compression stockings are removed   04/22/2021   vitamin C (ASCORBIC ACID) 500 MG tablet Take 500 mg by mouth every Saturday.   04/17/2021   vitamin E 180 MG (400 UNITS) capsule Take 400 Units by mouth every morning.   04/22/2021   Scheduled:   aspirin EC  81 mg Oral q morning   atropine  1 drop Right Eye QHS   brimonidine  1 drop Left Eye TID   dorzolamide-timolol  1 drop Left Eye BID   insulin aspart  0-6 Units Subcutaneous TID WC   latanoprost  1 drop Left Eye QHS   lidocaine       pantoprazole  40 mg Oral Daily   pilocarpine  1 drop Left Eye TID   prednisoLONE acetate  1 drop Right Eye QHS   sodium bicarbonate  1,300 mg Oral BID   sodium zirconium cyclosilicate  10 g Oral Daily   triamcinolone ointment  1 application Topical Daily   Infusions:   albumin human 25 g (04/23/21 2039)   furosemide Stopped (04/23/21 2000)   heparin 1,700 Units/hr (04/23/21 1900)   PRN:   Assessment: 67 yom presenting with SOB, weakness, dysuria. Heparin per pharmacy consult placed for atrial fibrillation.  Patient is not on anticoagulation prior to arrival.  Hgb 12.7; plt 143  2/25 AM update:  Heparin level therapeutic   Goal of Therapy:  Heparin level 0.3-0.7 units/ml Monitor platelets by anticoagulation protocol: Yes   Plan:  Cont heparin at 1700 units/hr Heparin level with AM labs  Narda Bonds, PharmD, [place] Pharmacist Phone: [phone]

## 2021-04-24 NOTE — Progress Notes (Signed)
Echocardiogram 2D Echocardiogram has been performed.  Oneal Deputy Curry Seefeldt RDCS 04/24/2021, 1:08 PM

## 2021-04-24 NOTE — Hospital Course (Addendum)
Austin Wheeler is a 81 y.o. male with medical history significant of HTN, IIDM, BPH, legally blind in both eyes, came with decreased urine output, swelling in abdomen bilateral lower extremities.   Patient started to have increasing bilateral lower extremities swelling about 3 months ago and was started of po Lasix 40 mg daily, swelling appears to be improving, patient however had episodes of feeling lightheadedness and "too much urination" and decided to cut down Lasix from 40 mg daily to 20 mg daily about 1 month ago.  Condition remained stable since until about 1 week ago, patient started to notice swelling in his abdomen and legs, and decreased urine output.  Finally, for the past 3 days, there has been barely any urine came out. He said he feeling "bladder fullness" when having BM, but no urine came out. No abdominal pain, no back pain, no shortness of breath, no fever or chills. No Hx of CHF or stroke.   ED Course: 1.  New A-fib on monitor, blood pressure on the lower side.   Blood work showed creatinine 15.5 compared to baseline less than 1 more than 10 months ago, BUN 18, bicarb 12, K6.1, lactic acid 3.8, albumin 3.4.   Lasix drip started by nephrology in ED.  **Interim History Patient had a paracentesis done yesterday at which drew off 8.5 L.  Nephrology following closely and had recommended dialysis but patient was hesitant.  They now placed the patient on Lasix 160 mg every 6 with minimal urine output improvement.  They also added metolazone 10 mg p.o. before next IV Lasix dose.  Given his hypotensive episode yesterday he was started on albumin x3.  Nephrology recommending continuing the catheter for now and he did have some hematuria so his heparin drip was held.  We will initiate once improved.  Avoiding nephrotoxic medication.  Nephrology ordering an echo given his anasarca which showed diastolic dysfunction.  He has had no history of liver disease and albumin was only mildly decreased.  If  patient does not continue to make urine we will need to have further discussion about dialysis and per my discussion with nephrology today and he will likely need CRRT given his softer blood pressures.  Patient is agreeable for Dialysis so will transfer to the ICU for CRRT per Nephrology Recc's.

## 2021-04-24 NOTE — Progress Notes (Signed)
Patient ID: Austin Wheeler, male   DOB: 1940/03/23, 81 y.o.   MRN: [id number] S: no complaints this morning.  Developed some gross hematuria early this morning and heparin drip was discontinued.  He is still not sure if he would want dialysis. O:BP 101/75 (BP Location: Right Arm)    Pulse 71    Temp 98 F (36.7 C) (Oral)    Resp 15    Ht _0  (1.981 m)    Wt 122.2 kg    SpO2 94%    BMI 31.13 kg/m   Intake/Output Summary (Last 24 hours) at 04/24/2021 1215 Last data filed at 04/24/2021 0700 Gross per 24 hour  Intake 159.47 ml  Output 600 ml  Net -440.53 ml   Intake/Output: I/O last 3 completed shifts: In: 159.5 [I.V.:50.5; IV Piggyback:109] Out: 600 [Urine:600]  Intake/Output this shift:  No intake/output data recorded. Weight change:  LDJ:TTSVXBL comfortably in bed, NAD CVS:RRR Resp: CTA Abd: +BS, distended, + abd wall edema Ext:3+ edema to mid chest  Recent Labs  Lab 04/23/21 1137 04/23/21 1329 04/23/21 2021 04/24/21 0546  NA 133* 131* 132* 132*  K 6.1* 5.9* 5.7* 5.7*  CL 99  --  100 100  CO2 12*  --  13* 13*  GLUCOSE 67*  --  106* 107*  BUN 118*  --  122* 120*  CREATININE 15.52*  --  16.13* 16.12*  ALBUMIN 3.4*  --   --  3.3*  CALCIUM 9.6  --  9.3 9.0  PHOS  --   --   --  6.2*  AST 18  --   --   --   ALT 11  --   --   --    Liver Function Tests: Recent Labs  Lab 04/23/21 1137 04/24/21 0546  AST 18  --   ALT 11  --   ALKPHOS 80  --   BILITOT 1.2  --   PROT 7.1  --   ALBUMIN 3.4* 3.3*   No results for input(s): LIPASE, AMYLASE in the last 168 hours. No results for input(s): AMMONIA in the last 168 hours. CBC: Recent Labs  Lab 04/23/21 1137 04/23/21 1329 04/24/21 0344  WBC 6.6  --  6.0  NEUTROABS 5.3  --   --   HGB 12.7* 13.3 12.5*  HCT 38.5* 39.0 38.0*  MCV 98.0  --  97.7  PLT 143*  --  109*   Cardiac Enzymes: No results for input(s): CKTOTAL, CKMB, CKMBINDEX, TROPONINI in the last 168 hours. CBG: Recent Labs  Lab 04/23/21 1432 04/23/21 1855  04/23/21 1951 04/24/21 0721 04/24/21 1204  GLUCAP 110* 94 100* 101* 105*    Iron Studies: No results for input(s): IRON, TIBC, TRANSFERRIN, FERRITIN in the last 72 hours. Studies/Results: CT ABDOMEN PELVIS WO CONTRAST  Result Date: 04/23/2021 CLINICAL DATA:  Renal failure, anuria EXAM: CT ABDOMEN AND PELVIS WITHOUT CONTRAST TECHNIQUE: Multidetector CT imaging of the abdomen and pelvis was performed following the standard protocol without IV contrast. RADIATION DOSE REDUCTION: This exam was performed according to the departmental dose-optimization program which includes automated exposure control, adjustment of the mA and/or kV according to patient size and/or use of iterative reconstruction technique. COMPARISON:  None. FINDINGS: Lower chest: Trace bilateral pleural effusions with associated compressive atelectasis. Cardiomegaly. Coronary artery atherosclerosis. Hepatobiliary: Unremarkable unenhanced appearance of the liver. No focal liver lesion identified. Gallbladder within normal limits. No hyperdense gallstone. No biliary dilatation. Pancreas: Unremarkable. No pancreatic ductal dilatation or surrounding inflammatory changes. Spleen: Normal  in size without focal abnormality. Adrenals/Urinary Tract: Unremarkable adrenal glands. 3.3 cm lower pole left renal cyst. Kidneys are otherwise unremarkable. No renal stone or hydronephrosis. Urinary bladder is decompressed by Foley catheter. Stomach/Bowel: Small hiatal hernia. Stomach appears otherwise within normal limits. Colonic diverticulosis. No evidence of bowel wall thickening, distention, or inflammatory changes. Vascular/Lymphatic: Scattered aortoiliac atherosclerotic calcifications without aneurysm. No abdominopelvic lymphadenopathy. Reproductive: Prostatomegaly. Other: Moderate-large volume ascites. No pneumoperitoneum. No abdominal wall hernia. Musculoskeletal: Diffuse anasarca. Scattered densely sclerotic bone islands within the pelvis and spine. No  acute osseous findings. IMPRESSION: 1. No evidence of obstructive uropathy. 2. Moderate-large volume ascites with diffuse anasarca. 3. Trace bilateral pleural effusions with associated compressive atelectasis. 4. Colonic diverticulosis without evidence of acute diverticulitis. 5. Prostatomegaly. 6. Aortic Atherosclerosis (ICD10-I70.0). Electronically Signed   By: Davina Poke D.O.   On: 04/23/2021 14:13   US RENAL  Result Date: 04/23/2021 CLINICAL DATA:  Acute kidney injury EXAM: RENAL / URINARY TRACT ULTRASOUND COMPLETE COMPARISON:  Same day CT. FINDINGS: Right Kidney: Renal measurements: 13.1 x 6.0 x 7.3 cm = volume: 300.4 mL. Increased renal cortical echogenicity. No hydronephrosis. Left Kidney: Renal measurements: 13.9 x 5.7 x 6.5 cm = volume: 269.0 mL. Increased renal cortical echogenicity. No hydronephrosis. There is a simple appearing cyst in the inferior pole measuring 4.3 x 3.0 x 2.8 cm. Bladder: Foley catheter in place. Other: Bilateral pleural effusions.  Large volume ascites. IMPRESSION: Increased renal cortical echogenicity bilaterally, as can be seen in medical renal disease. No hydronephrosis. Bilateral pleural effusions and large volume ascites noted. Electronically Signed   By: Maurine Simmering M.D.   On: 04/23/2021 14:52   DG Chest Portable 1 View  Result Date: 04/23/2021 CLINICAL DATA:  81 year old male with dyspnea, atrial fibrillation. EXAM: PORTABLE CHEST - 1 VIEW COMPARISON:  None. FINDINGS: The mediastinal contours are within normal limits. Cardiomegaly. Low lung volumes. Mild bibasilar streaky opacities. No evidence of significant pleural effusion or pneumothorax. No acute osseous abnormality. IMPRESSION: Cardiomegaly with low lung volumes and bibasilar subsegmental atelectasis. Electronically Signed   By: Ruthann Cancer M.D.   On: 04/23/2021 12:35   IR Paracentesis  Result Date: 04/24/2021 INDICATION: Patient history congestive heart failure, admitted with acute renal and anasarca  found to have new onset ascites. Request IR for therapeutic paracentesis EXAM: ULTRASOUND GUIDED THERAPEUTIC PARACENTESIS MEDICATIONS: 7 mL 1% lidocaine COMPLICATIONS: None immediate. PROCEDURE: Informed written consent was obtained from the patient after a discussion of the risks, benefits and alternatives to treatment. A timeout was performed prior to the initiation of the procedure. Initial ultrasound scanning demonstrates a large amount of ascites within the right lower abdominal quadrant. The right lower abdomen was prepped and draped in the usual sterile fashion. 1% lidocaine was used for local anesthesia. Following this, a 19 gauge, 7-cm, Yueh catheter was introduced. An ultrasound image was saved for documentation purposes. The paracentesis was performed. The catheter was removed and a dressing was applied. The patient tolerated the procedure well without immediate post procedural complication. FINDINGS: A total of approximately 8.3 L of clear yellow fluid was removed. IMPRESSION: Successful ultrasound-guided paracentesis yielding 8.3 liters of peritoneal fluid. Read by Candiss Norse, PA-C Electronically Signed   By: Aletta Edouard M.D.   On: 04/24/2021 07:53    aspirin EC  81 mg Oral q morning   atropine  1 drop Right Eye QHS   brimonidine  1 drop Left Eye TID   Chlorhexidine Gluconate Cloth  6 each Topical Q0600   dorzolamide-timolol  1 drop Left Eye BID   insulin aspart  0-6 Units Subcutaneous TID WC   latanoprost  1 drop Left Eye QHS   metolazone  10 mg Oral Daily   pantoprazole  40 mg Oral Daily   pilocarpine  1 drop Left Eye TID   prednisoLONE acetate  1 drop Right Eye QHS   sodium bicarbonate  1,300 mg Oral BID   sodium zirconium cyclosilicate  10 g Oral Daily   triamcinolone ointment  1 application Topical Daily    BMET    Component Value Date/Time   NA 132 (L) 04/24/2021 0546   NA 144 05/13/2020 1051   K 5.7 (H) 04/24/2021 0546   CL 100 04/24/2021 0546   CO2 13 (L)  04/24/2021 0546   GLUCOSE 107 (H) 04/24/2021 0546   BUN 120 (H) 04/24/2021 0546   BUN 11 05/13/2020 1051   CREATININE 16.12 (H) 04/24/2021 0546   CALCIUM 9.0 04/24/2021 0546   GFRNONAA 3 (L) 04/24/2021 0546   GFRAA  02/06/2008 1915    >60        The eGFR has been calculated using the MDRD equation. This calculation has not been validated in all clinical   CBC    Component Value Date/Time   WBC 6.0 04/24/2021 0344   RBC 3.89 (L) 04/24/2021 0344   HGB 12.5 (L) 04/24/2021 0344   HCT 38.0 (L) 04/24/2021 0344   PLT 109 (L) 04/24/2021 0344   MCV 97.7 04/24/2021 0344   MCH 32.1 04/24/2021 0344   MCHC 32.9 04/24/2021 0344   RDW 15.1 04/24/2021 0344   LYMPHSABS 0.5 (L) 04/23/2021 1137   MONOABS 0.7 04/23/2021 1137   EOSABS 0.1 04/23/2021 1137   BASOSABS 0.0 04/23/2021 1137    Assessment/Plan:  Acute renal failure, anuric - appears to have started about 3 weeks ago when he noted decreased UOP but has not had any UOP for several days.  No nephrotoxic agents identified and unable to send for UA.  He has had proteinuria and hematuria in the past.  His SCr was 0.81 11 months ago and only new medication was lasix for edema.  I discussed the possible need for dialysis with he and his wife, but he is not sure about proceeding with that and would like to try IV lasix first.  Possible cardiorenal syndrome in setting of anasarca, ATN if has had nephrotic range proteinuria, although serum alb is 3.4 (only mildly decreased), or acute GN.   Will send off serologies for acute GN Will eventually need a kidney biopsy to help identify cause of ARF Started IV lasix 160 mg q6 with only 600 ml overnight. Will add metolazone 10 mg before next IV lasix dose. Continue to hold ARB and metformin Keep foley catheter in place and follow UOP and Scr If no response to IV lasix would recommend a trial of dialysis, however pt remains reluctant to proceed at this time.  Will revisit issue.  Discussed risks of death  from uremia. Renal dose meds for eGFR <10 Avoid nephrotoxic agents such as IV contrast, NSAIDs/Cox-II I's, and phosphate containing bowel preps (FLEETS) Anasarca - unclear etiology.  Possibly due to anuric ARF, Nephrotic syndrome, but also need ECHO to evaluate EF to r/o CHF and cardiorenal syndrome.  No history of liver disease and albumin only mildly decreased.  IV lasix and metolazone as above. Atrial fibrillation - new onset.  Asymptomatic.  Rate controlled. Hyperkalemia - was on KCl supplements at home and now with anuric  ARF.  Given IV insulin/D50, lokelma 10 grams and will start IV lasix.  Per ED no peaked t-waves. Metabolic acidosis - due to ARF and metformin use.  Metformin on hold.  Will start po bicarb. Disposition - recommend palliative care consult to help set goals/limits of care.   Donetta Potts, MD [facility]

## 2021-04-24 NOTE — Progress Notes (Signed)
Progress Note   Patient: Austin Wheeler XBM:[id number] DOB: Jul 17, 1940 DOA: 04/23/2021     1 DOS: the patient was seen and examined on 04/24/2021   Brief hospital course: Austin Wheeler is a 81 y.o. male with medical history significant of HTN, IIDM, BPH, legally blind in both eyes, came with decreased urine output, swelling in abdomen bilateral lower extremities.   Patient started to have increasing bilateral lower extremities swelling about 3 months ago and was started of po Lasix 40 mg daily, swelling appears to be improving, patient however had episodes of feeling lightheadedness and "too much urination" and decided to cut down Lasix from 40 mg daily to 20 mg daily about 1 month ago.  Condition remained stable since until about 1 week ago, patient started to notice swelling in his abdomen and legs, and decreased urine output.  Finally, for the past 3 days, there has been barely any urine came out. He said he feeling "bladder fullness" when having BM, but no urine came out. No abdominal pain, no back pain, no shortness of breath, no fever or chills. No Hx of CHF or stroke.   ED Course: 1.  New A-fib on monitor, blood pressure on the lower side.   Blood work showed creatinine 15.5 compared to baseline less than 1 more than 10 months ago, BUN 18, bicarb 12, K6.1, lactic acid 3.8, albumin 3.4.   Lasix drip started by nephrology in ED.  **Interim History Patient had a paracentesis done yesterday at which drew off 8.5 L.  Nephrology following closely and had recommended dialysis but patient was hesitant.  They now placed the patient on Lasix 160 mg every 6 with minimal urine output improvement.  They also added metolazone 10 mg p.o. before next IV Lasix dose.  Given his hypotensive episode yesterday he was started on albumin x3.  Nephrology recommending continuing the catheter for now and he did have some hematuria so his heparin drip was held.  We will initiate once improved.  Avoiding nephrotoxic  medication.  Nephrology ordering an echo given his anasarca which showed diastolic dysfunction.  He has had no history of liver disease and albumin was only mildly decreased.  If patient does not continue to make urine we will need to have further discussion about dialysis and per my discussion with nephrology today and he will likely need CRRT given his softer blood pressures.  Assessment and Plan: No notes have been filed under this hospital service. Service: Hospitalist  AKI, anuria -Likely secondary to ATN; nephrology ruling out other conditions; his symptoms started about 3 weeks ago when he noticed decreased urinary output and has not had any urinary output for several days.  Nephrology signing off serologies for acute glomerulonephritis and will likely need a kidney biopsy to help identify the cause of his acute renal failure.  They have initiated diuretics with IV Lasix with only marginal urine output.  We have added metolazone and recommended continue hold ARB and metformin.  They are recommending continuing Foley and they have recommended trial of dialysis if Lasix does not improve however patient will remain reluctant to proceed with this at this time and family to have discussions about initiating dialysis. -With decompensated non-anion gap and anion gap metabolic acidosis, acute urea and azotemia and hyperkalemia -Nephrology consulted, patient was started on Lasix drip in ED this has been changed to scheduled Lasix. -D/W patient and his wife at bedside, patient is DNR/DNI, however accept HD if necessary. BP on lower side,  will admit to PCU for close monitoring. -Foley for accurate I/O -Strict I's and O's and Daily Weights -May need to be transferred to the unit for CRRT  Anasarca -Likely in setting of renal failure -Nephrology does not feel that this is nephrotic syndrome given his protein and albumin but they have ordered an echo to evaluate his EF to evaluate for CHF and cardiorenal  syndrome.  He has no history of liver disease and albumin is only mildly decreased so they have started him on IV Lasix and metolazone -Patient had a abdominal paracentesis as noted   Decompensated non-anion gap and anion gap metabolic acidosis -Start p.o. bicarb -Nephrology feels that this is secondary to ARF and metformin use   Acute uremia -No significant encephalopathy -We will start GI prophylaxis with PPI daily   Hyperkalemia -No ST-T changes on EKG, received Lokelma, nephrology gave the patient insulin and D50 and as well as Lokelma and have started IV Lasix -Remains elevated at 5.7   New onset afib -Rate controlled -CHADS2=3, on heparin drip but this has been held given his hematuria. No S/S of CHF, outpatient Echo and cardiology f/u.   Elevated lactate -No significant infection sources found, chest xray clear, no diarrhea. -Hold off ABX for now given that he is afebrile and has no white count   HTN -Hold all BP meds given that he was hypotensive after his paracentesis; received some albumin   IIDM -D/C Metformin -Start sliding scale   BPH -Foley for now  Obesity -Complicates overall prognosis and care -Estimated body mass index is 31.13 kg/m as calculated from the following:   Height as of this encounter: 6\' 6"  (1.981 m).   Weight as of this encounter: 122.2 kg.  -Weight Loss and Dietary Counseling given    Subjective: Seen and examined at bedside and he was doing okay and did not want to open his eyes to speak with me but felt that he had no urine output in thinks he is doing a little bit better today.  Feels better after the pull of all the fluid from his abdomen.  No nausea or vomiting.  Still hesitant to start dialysis.  Physical Exam: Vitals:   04/24/21 0838 04/24/21 1205 04/24/21 1550 04/24/21 1934  BP: 98/71 101/75 114/79 118/78  Pulse: 70 71 60 79  Resp: 15 15 16 14   Temp:  98 F (36.7 C)  (!) 96.4 F (35.8 C)  TempSrc:  Oral  Axillary  SpO2: 96%  94% 97% 95%  Weight:      Height:       Examination: Physical Exam:  Constitutional: WN/WD chronically ill-appearing African-American obese elderly male in no acute distress appears a little somnolent but answers questions appropriately Respiratory: Diminished to auscultation bilaterally with coarse breath sounds, no wheezing, rales, rhonchi or crackles. Normal respiratory effort and patient is not tachypenic. No accessory muscle use.  Not wearing any supplemental oxygen via nasal cannula Cardiovascular: RRR, no murmurs / rubs / gallops. S1 and S2 auscultated.  Has 2+ to 3+ lower extremity pitting edema Abdomen: Soft, non-tender, distended secondary to body habitus and anasarca.  Bowel sounds positive.  GU: Deferred. Musculoskeletal: No clubbing / cyanosis of digits/nails. No joint deformity upper and lower extremities.  Data Reviewed: I personally reviewed and interpreted the patient's clinical laboratory data  Patient continued to have acute renal failure with a BUN/creatinine elevated at 120/16.12 patient's potassium is 5.7 sodium is 132.  Phosphorus level is also elevated at 6.2 and his  hemoglobin/hematocrit is 12.5/38.0 with a platelet count of 1.09; his INR is 1.9  Family Communication: Discussed with the son and wife over the telephone  Disposition: Status is: Inpatient Remains inpatient appropriate because: We will need further work-up for his acute renal failure  Planned Discharge Destination: Skilled nursing facility  DVT Prophylaxis: Was initiated is on a heparin drip but this has been held  Chief Strategy Officer: Austin Noble, DO [facility] 04/24/2021 7:39 PM  For on call review [email or web address].

## 2021-04-25 ENCOUNTER — Inpatient Hospital Stay (HOSPITAL_COMMUNITY): Payer: Medicare PPO

## 2021-04-25 DIAGNOSIS — Z515 Encounter for palliative care: Secondary | ICD-10-CM

## 2021-04-25 DIAGNOSIS — E875 Hyperkalemia: Secondary | ICD-10-CM

## 2021-04-25 DIAGNOSIS — Z7189 Other specified counseling: Secondary | ICD-10-CM

## 2021-04-25 DIAGNOSIS — N179 Acute kidney failure, unspecified: Secondary | ICD-10-CM

## 2021-04-25 LAB — GLUCOSE, CAPILLARY
Glucose-Capillary: 79 mg/dL (ref 70–99)
Glucose-Capillary: 72 mg/dL (ref 70–99)
Glucose-Capillary: 78 mg/dL (ref 70–99)
Glucose-Capillary: 78 mg/dL (ref 70–99)

## 2021-04-25 LAB — CBC WITH DIFFERENTIAL/PLATELET
Abs Immature Granulocytes: 0.03 10*3/uL (ref 0.00–0.07)
Basophils Absolute: 0 10*3/uL (ref 0.0–0.1)
Basophils Relative: 0 %
Eosinophils Absolute: 0 10*3/uL (ref 0.0–0.5)
Eosinophils Relative: 0 %
HCT: 37 % — ABNORMAL LOW (ref 39.0–52.0)
Hemoglobin: 12.9 g/dL — ABNORMAL LOW (ref 13.0–17.0)
Immature Granulocytes: 0 %
Lymphocytes Relative: 4 %
Lymphs Abs: 0.3 10*3/uL — ABNORMAL LOW (ref 0.7–4.0)
MCH: 32.6 pg (ref 26.0–34.0)
MCHC: 34.9 g/dL (ref 30.0–36.0)
MCV: 93.4 fL (ref 80.0–100.0)
Monocytes Absolute: 0.6 10*3/uL (ref 0.1–1.0)
Monocytes Relative: 8 %
Neutro Abs: 6.6 10*3/uL (ref 1.7–7.7)
Neutrophils Relative %: 88 %
Platelets: 125 10*3/uL — ABNORMAL LOW (ref 150–400)
RBC: 3.96 MIL/uL — ABNORMAL LOW (ref 4.22–5.81)
RDW: 14.8 % (ref 11.5–15.5)
WBC: 7.6 10*3/uL (ref 4.0–10.5)
nRBC: 0 % (ref 0.0–0.2)

## 2021-04-25 LAB — COMPREHENSIVE METABOLIC PANEL
ALT: 10 U/L (ref 0–44)
AST: 16 U/L (ref 15–41)
Albumin: 3.4 g/dL — ABNORMAL LOW (ref 3.5–5.0)
Alkaline Phosphatase: 62 U/L (ref 38–126)
Anion gap: 17 — ABNORMAL HIGH (ref 5–15)
BUN: 124 mg/dL — ABNORMAL HIGH (ref 8–23)
CO2: 15 mmol/L — ABNORMAL LOW (ref 22–32)
Calcium: 9.2 mg/dL (ref 8.9–10.3)
Chloride: 102 mmol/L (ref 98–111)
Creatinine, Ser: 16.32 mg/dL — ABNORMAL HIGH (ref 0.61–1.24)
GFR, Estimated: 3 mL/min — ABNORMAL LOW (ref >60)
Glucose, Bld: 88 mg/dL (ref 70–99)
Potassium: 5.9 mmol/L — ABNORMAL HIGH (ref 3.5–5.1)
Sodium: 134 mmol/L — ABNORMAL LOW (ref 135–145)
Total Bilirubin: 1.1 mg/dL (ref 0.3–1.2)
Total Protein: 6.1 g/dL — ABNORMAL LOW (ref 6.5–8.1)

## 2021-04-25 LAB — RENAL FUNCTION PANEL
Albumin: 3.1 g/dL — ABNORMAL LOW (ref 3.5–5.0)
Anion gap: 16 — ABNORMAL HIGH (ref 5–15)
BUN: 124 mg/dL — ABNORMAL HIGH (ref 8–23)
CO2: 16 mmol/L — ABNORMAL LOW (ref 22–32)
Calcium: 9.2 mg/dL (ref 8.9–10.3)
Chloride: 100 mmol/L (ref 98–111)
Creatinine, Ser: 16.29 mg/dL — ABNORMAL HIGH (ref 0.61–1.24)
GFR, Estimated: 3 mL/min — ABNORMAL LOW (ref >60)
Glucose, Bld: 84 mg/dL (ref 70–99)
Phosphorus: 6.5 mg/dL — ABNORMAL HIGH (ref 2.5–4.6)
Potassium: 5.9 mmol/L — ABNORMAL HIGH (ref 3.5–5.1)
Sodium: 132 mmol/L — ABNORMAL LOW (ref 135–145)

## 2021-04-25 LAB — C4 COMPLEMENT: Complement C4, Body Fluid: 20 mg/dL (ref 12–38)

## 2021-04-25 LAB — ANTISTREPTOLYSIN O TITER: ASO: <20 IU/mL (ref 0.0–200.0)

## 2021-04-25 LAB — MAGNESIUM: Magnesium: 2.8 mg/dL — ABNORMAL HIGH (ref 1.7–2.4)

## 2021-04-25 LAB — COMPLEMENT, TOTAL: Compl, Total (CH50): 60 U/mL (ref >41)

## 2021-04-25 LAB — MRSA NEXT GEN BY PCR, NASAL: MRSA by PCR Next Gen: NOT DETECTED

## 2021-04-25 LAB — C3 COMPLEMENT: C3 Complement: 84 mg/dL (ref 82–167)

## 2021-04-25 LAB — PHOSPHORUS: Phosphorus: 6.3 mg/dL — ABNORMAL HIGH (ref 2.5–4.6)

## 2021-04-25 MED ORDER — HEPARIN SODIUM (PORCINE) 1000 UNIT/ML DIALYSIS
1000.0000 [IU] | INTRAMUSCULAR | Status: DC | PRN
  Administered 2021-04-25 – 2021-04-28 (×2): 2400 [IU] via INTRAVENOUS_CENTRAL
  Filled 2021-04-25: qty 3
  Filled 2021-04-25: qty 6
  Filled 2021-04-25: qty 5

## 2021-04-25 MED ORDER — PRISMASOL BGK 4/2.5 32-4-2.5 MEQ/L EC SOLN
Status: DC
  Filled 2021-04-25 (×7): qty 5000
  Filled 2021-04-25: qty 15000
  Filled 2021-04-25 (×6): qty 5000
  Filled 2021-04-25: qty 15000
  Filled 2021-04-25 (×20): qty 5000

## 2021-04-25 MED ORDER — CHLORHEXIDINE GLUCONATE CLOTH 2 % EX PADS
6.0000 | MEDICATED_PAD | Freq: Every day | CUTANEOUS | Status: DC
  Administered 2021-04-26 – 2021-05-05 (×10): 6 via TOPICAL

## 2021-04-25 MED ORDER — PRISMASOL BGK 4/2.5 32-4-2.5 MEQ/L REPLACEMENT SOLN
Status: DC
  Filled 2021-04-25 (×9): qty 5000
  Filled 2021-04-25: qty 15000

## 2021-04-25 MED ORDER — CHLORHEXIDINE GLUCONATE CLOTH 2 % EX PADS
6.0000 | MEDICATED_PAD | Freq: Every day | CUTANEOUS | Status: DC
  Administered 2021-04-25: 6 via TOPICAL

## 2021-04-25 MED ORDER — PRISMASOL BGK 4/2.5 32-4-2.5 MEQ/L REPLACEMENT SOLN
Status: DC
  Filled 2021-04-25: qty 15000
  Filled 2021-04-25 (×9): qty 5000

## 2021-04-25 MED ORDER — HEPARIN (PORCINE) 2000 UNITS/L FOR CRRT: INTRAVENOUS_CENTRAL | Status: DC | PRN

## 2021-04-25 MED ORDER — ORAL CARE MOUTH RINSE
15.0000 mL | Freq: Two times a day (BID) | OROMUCOSAL | Status: DC
  Administered 2021-04-25 – 2021-05-14 (×32): 15 mL via OROMUCOSAL

## 2021-04-25 NOTE — Progress Notes (Signed)
[place] for Heparin (on hold as of 2/25) Indication: atrial fibrillation  Allergies  Allergen Reactions   Guaifenesin Swelling and Rash    Hand and feet swelling from Robitussin   Influenza Vaccines Other (See Comments)    Caused flu-like illness   Lexapro [Escitalopram] Nausea And Vomiting    Reported by [facility] - pt does not recall    Patient Measurements: Height: 6\' 6"  (198.1 cm) Weight: 122.2 kg (269 lb 6.4 oz) IBW/kg (Calculated) : 91.4 Heparin Dosing Weight: 120.4 kg  Vital Signs: Temp: 97.5 F (36.4 C) (02/26 0515) Temp Source: Oral (02/26 0515) BP: 110/81 (02/26 0806) Pulse Rate: 90 (02/26 0806)  Labs: Recent Labs    04/23/21 1137 04/23/21 1329 04/23/21 1333 04/23/21 2021 04/24/21 0344 04/24/21 0546 04/25/21 0231  HGB 12.7* 13.3  --   --  12.5*  --  12.9*  HCT 38.5* 39.0  --   --  38.0*  --  37.0*  PLT 143*  --   --   --  109*  --  125*  LABPROT  --   --   --  18.7*  --   --   --   INR  --   --   --  1.6*  --   --   --   HEPARINUNFRC  --   --   --  0.30  --   --   --   CREATININE 15.52*  --   --  16.13*  --  16.12* 16.32*  TROPONINIHS 17  --  16  --   --   --   --      Estimated Creatinine Clearance: 5.4 mL/min (A) (by C-G formula based on SCr of 16.32 mg/dL (H)).   Medical History: Past Medical History:  Diagnosis Date   Blind    Diabetes mellitus without complication ([place])    Hypertension     Medications:  Medications Prior to Admission  Medication Sig Dispense Refill Last Dose   acetaminophen (TYLENOL) 500 MG tablet Take 500 mg by mouth daily as needed for headache (pain).   week ago   amLODipine (NORVASC) 10 MG tablet Take 10 mg by mouth every morning.   04/22/2021   aspirin EC 81 MG tablet 81 mg every morning.   04/22/2021   atropine 1 % ophthalmic solution Place 1 drop into the right eye at bedtime.   04/22/2021   brimonidine (ALPHAGAN) 0.2 % ophthalmic solution Place 1 drop into the left eye  3 (three) times daily.   04/22/2021 at pm   cholecalciferol (VITAMIN D3) 25 MCG (1000 UNIT) tablet Take 1,000 Units by mouth 2 (two) times daily.   04/22/2021 at pm   dorzolamide-timolol (COSOPT) 22.3-6.8 MG/ML ophthalmic solution Place 1 drop into the left eye 2 (two) times daily.   04/22/2021 at 2000   doxazosin (CARDURA) 8 MG tablet Take 1 tablet (8 mg total) by mouth daily. (Patient taking differently: Take 8 mg by mouth every morning.) 90 tablet 3 04/22/2021   dutasteride (AVODART) 0.5 MG capsule Take 1 capsule (0.5 mg total) by mouth daily. (Patient taking differently: Take 0.5 mg by mouth every Monday, Wednesday, and Friday.) 90 capsule 3 04/21/2021   furosemide (LASIX) 40 MG tablet Take 40 mg by mouth every morning.   0/37/0488   Garlic 891 MG TABS Take 500 mg by mouth every morning.   04/22/2021   latanoprost (XALATAN) 0.005 % ophthalmic solution Place 1 drop into the left  eye at bedtime.   04/22/2021 at pm   metFORMIN (GLUCOPHAGE) 500 MG tablet Take 250 mg by mouth 2 (two) times daily.   04/22/2021 at pm   metoprolol tartrate (LOPRESSOR) 50 MG tablet Take 50 mg by mouth 2 (two) times daily.   04/22/2021 at 2000   Multiple Vitamin (MULTIVITAMIN WITH MINERALS) TABS tablet Take 1 tablet by mouth in the morning.   04/22/2021   Multiple Vitamins-Minerals (PRESERVISION AREDS 2) CAPS Take 1 capsule by mouth 2 (two) times daily.   04/22/2021 at pm   olmesartan-hydrochlorothiazide (BENICAR HCT) 40-12.5 MG tablet Take 1 tablet by mouth every morning.   04/22/2021   OVER THE COUNTER MEDICATION Take 1 capsule by mouth See admin instructions. Go Out (OTC) - take one capsule by mouth on Tuesday and Thursday morning (for gout)   04/22/2021   pilocarpine (PILOCAR) 4 % ophthalmic solution Place 1 drop into the left eye 3 (three) times daily.  11 04/22/2021 at pm   potassium chloride SA (KLOR-CON M) 20 MEQ tablet Take 10 mEq by mouth every morning.   04/22/2021   prednisoLONE acetate (PRED FORTE) 1 % ophthalmic suspension  Place 1 drop into the right eye at bedtime.   04/22/2021   triamcinolone ointment (KENALOG) 0.1 % Apply 1 application topically See admin instructions. Apply topically legs nightly after compression stockings are removed   04/22/2021   vitamin C (ASCORBIC ACID) 500 MG tablet Take 500 mg by mouth every Saturday.   04/17/2021   vitamin E 180 MG (400 UNITS) capsule Take 400 Units by mouth every morning.   04/22/2021   Scheduled:   aspirin EC  81 mg Oral q morning   atropine  1 drop Right Eye QHS   brimonidine  1 drop Left Eye TID   Chlorhexidine Gluconate Cloth  6 each Topical Q0600   dorzolamide-timolol  1 drop Left Eye BID   insulin aspart  0-6 Units Subcutaneous TID WC   latanoprost  1 drop Left Eye QHS   metolazone  10 mg Oral Daily   pantoprazole  40 mg Oral Daily   pilocarpine  1 drop Left Eye TID   prednisoLONE acetate  1 drop Right Eye QHS   sodium bicarbonate  1,300 mg Oral BID   sodium zirconium cyclosilicate  10 g Oral Daily   triamcinolone ointment  1 application Topical Daily   Infusions:   furosemide 160 mg (04/25/21 0428)   PRN:   Assessment: 27 yom presenting with SOB, weakness, dysuria. Heparin per pharmacy consult placed for new onset atrial fibrillation. Patient is not on anticoagulation prior to arrival.  Heparin was held 2/25 due to worsening hematuria. Per MD note, will re-initiate once hematuria improved. Patient rhythm is still atrial fibrillation. Hgb stable at 12.9, plt slightly low at 125.  Goal of Therapy:  Heparin level 0.3-0.7 units/ml Monitor platelets by anticoagulation protocol: Yes   Plan:  Continue to hold heparin given hematuria  Monitor for further s/sx of bleeding F/up restarting anticoagulation   Joseph Art, Pharm.D. PGY-1 Pharmacy Resident [phone] 04/25/2021 8:33 AM

## 2021-04-25 NOTE — Consult Note (Signed)
NAME:  Austin Wheeler, MRN:  [id number], DOB:  Jul 27, 1940, LOS: 2 ADMISSION DATE:  04/23/2021, CONSULTATION DATE:  04/25/2021 REFERRING MD:  Dr. Alfredia Ferguson, CHIEF COMPLAINT:  AKI    History of Present Illness:  Austin Wheeler is a 81 y.o. male with medical history significant of HTN, IIDM, BPH, legally blind in both eyes, came with decreased urine output, swelling in abdomen bilateral lower extremities.   Patient started to have increasing bilateral lower extremities swelling about 3 months ago and was started of po Lasix 40 mg daily, swelling appears to be improving, patient however had episodes of feeling lightheadedness and "too much urination" and decided to cut down Lasix from 40 mg daily to 20 mg daily about 1 month ago. Condition remained stable since until about 1 week ago, patient started to notice swelling in his abdomen and legs, and decreased urine output.  Finally, for the past 3 days, there has been barely any urine came out. He said he feeling "bladder fullness" when having BM, but no urine came out. No abdominal pain, no back pain, no shortness of breath, no fever or chills. No Hx of CHF or stroke.  Pertinent  Medical History   Past Medical History:  Diagnosis Date   Blind    Diabetes mellitus without complication ([place])    Hypertension    Significant Hospital Events: Including procedures, antibiotic start and stop dates in addition to other pertinent events   2/24: Admitted to [facility] for acute on chronic renal failure, new onset a. Fib. Nephrology consulted; high dose Lasix trial initiated 2/26: PCCM consulted. Transfer to ICU for potential CRRT   Interim History / Subjective:   Consulted. Patient repeats "I dont know" to questions.   Objective   Blood pressure 110/81, pulse 90, temperature (!) 97.5 F (36.4 C), temperature source Oral, resp. rate 16, height 6\' 6"  (1.981 m), weight 122.2 kg, SpO2 95 %.        Intake/Output Summary (Last 24 hours) at 04/25/2021 1026 Last data  filed at 04/25/2021 0800 Gross per 24 hour  Intake 246 ml  Output 50 ml  Net 196 ml   Filed Weights   04/23/21 1127 04/23/21 1900  Weight: 134.7 kg 122.2 kg   Examination: General: Sleeping but wakes easily. Acute ill appearing. Confused HENT: Moist mucus membranes. JVD present Lungs: Diminished breathe sounds throughout but no tachypnea or increased work of breathing Cardiovascular: Irregularly irregular. S3 gallop present. No murmurs.  Abdomen: Distended and tympanic on percussion. No tenderness to palpation. Active bowel sounds  Extremities: 3+ pitting edema of bilateral lower extremities up to the thighs Neuro: Initially sleeping but woke easily. Repeats answers however oriented to person, place and situation.  GU: Deferred  Resolved Hospital Problem list   N/A  Assessment & Plan:   # Acute Kidney Injury w/ Anuria  # Uremic Encephalopathy  Previous creatinine 0.78 in May 2022.  Several week history of swelling with decreased urine intake. Despite high dose lasix with metolazone, urine output remains minimal. Differential includes ATN versus nephrotic syndrome versus acute GN given hematuria. Unable to check UPCR due to lack of urine production.   - Nephrology following; appreciate their recommendations - Will place HD catheter today  - CRRT initiation per Nephrology - Continue foley catheter - Continue strict UOP monitoring - Continue monitor daily creatinine  # Hyperkalemia  # Hyperphosphatemia  - Receiving Lasix and Lokelma without significant improvement. EKG with a. Fib but no peaked T waves.  - Continue  Lokelma TID for now   # Anion Gap Metabolic Acidosis  # Lactic Acidosis  - In the setting of renal failure  - Continue bicarb infusion   # New Onset A. Fib  CHA2DS2-VASc Score = 3. AC held due to significant hematuria with clots.   - No need for rate control at this time. Metoprolol 2.5 mg for HR > 110 available   # Anasarca 2/2 Ascites  Suspected to be  secondary to renal failure at this time although patient does have known history of HF. No history of liver disease. Large volume paracentesis on admission. Unfortunately, peritoneal fluid not sent to lab.   # RV Failure  # History of CHF Per chart review, history of biventricular heart failure but cannot locate echocardiogram report. Current TTE with evidence of preserved LVEF but moderately reduced RVEF. Dilated morphology noted, however wonder if this may be secondary to profound hypervolemia.   - Hold off on Cardiology consultation for now   # Normocytic Anemia  # Thrombocytopenia  - Monitor CBC daily - Transfuse for hemoglobin < 7 - Iron panel and ferritin pending   # Elevated INR  Likely due to congestive hepatopathy.   - No indication for Vitamin K - Monitor INR daily  # History of Prostate Adenocarcinoma   Localized only. Deferred surgical intervention; undergoing active surveillance with Urology.    Best Practice (right click and "Reselect all SmartList Selections" daily)   Diet/type: Regular consistency (see orders) DVT prophylaxis: SCD GI prophylaxis: PPI Lines: Dialysis Catheter Foley:  Yes, and it is still needed Code Status:  DNR Last date of multidisciplinary goals of care discussion [Wife and son updated at bedside on 04/25/2021]  Labs   CBC: Recent Labs  Lab 04/23/21 1137 04/23/21 1329 04/24/21 0344 04/25/21 0231  WBC 6.6  --  6.0 7.6  NEUTROABS 5.3  --   --  6.6  HGB 12.7* 13.3 12.5* 12.9*  HCT 38.5* 39.0 38.0* 37.0*  MCV 98.0  --  97.7 93.4  PLT 143*  --  109* 125*    Basic Metabolic Panel: Recent Labs  Lab 04/23/21 1137 04/23/21 1329 04/23/21 2021 04/24/21 0546 04/25/21 0231  NA 133* 131* 132* 132* 134*  K 6.1* 5.9* 5.7* 5.7* 5.9*  CL 99  --  100 100 102  CO2 12*  --  13* 13* 15*  GLUCOSE 67*  --  106* 107* 88  BUN 118*  --  122* 120* 124*  CREATININE 15.52*  --  16.13* 16.12* 16.32*  CALCIUM 9.6  --  9.3 9.0 9.2  MG  --   --   --    --  2.8*  PHOS  --   --   --  6.2* 6.3*   GFR: Estimated Creatinine Clearance: 5.4 mL/min (A) (by C-G formula based on SCr of 16.32 mg/dL (H)). Recent Labs  Lab 04/23/21 1137 04/23/21 1338 04/23/21 1647 04/24/21 0344 04/25/21 0231  WBC 6.6  --   --  6.0 7.6  LATICACIDVEN  --  3.8* 4.3*  --   --     Liver Function Tests: Recent Labs  Lab 04/23/21 1137 04/24/21 0546 04/25/21 0231  AST 18  --  16  ALT 11  --  10  ALKPHOS 80  --  62  BILITOT 1.2  --  1.1  PROT 7.1  --  6.1*  ALBUMIN 3.4* 3.3* 3.4*   No results for input(s): LIPASE, AMYLASE in the last 168 hours. No results for input(s):  AMMONIA in the last 168 hours.  ABG    Component Value Date/Time   HCO3 11.7 (L) 04/23/2021 1329   TCO2 12 (L) 04/23/2021 1329   ACIDBASEDEF 14.0 (H) 04/23/2021 1329   O2SAT 92 04/23/2021 1329   Coagulation Profile: Recent Labs  Lab 04/23/21 2021  INR 1.6*   Cardiac Enzymes: No results for input(s): CKTOTAL, CKMB, CKMBINDEX, TROPONINI in the last 168 hours.  HbA1C: Hgb A1c MFr Bld  Date/Time Value Ref Range Status  04/23/2021 08:21 PM 4.8 4.8 - 5.6 % Final    Comment:    (NOTE) Pre diabetes:          5.7%-6.4%  Diabetes:              >6.4%  Glycemic control for   <7.0% adults with diabetes    CBG: Recent Labs  Lab 04/24/21 0721 04/24/21 1204 04/24/21 1547 04/24/21 2135 04/25/21 0735  GLUCAP 101* 105* 87 87 79    Review of Systems:   Negative except as noted above.   Past Medical History:  He,  has a past medical history of Blind, Diabetes mellitus without complication ([place]), and Hypertension.   Surgical History:   Past Surgical History:  Procedure Laterality Date   IR PARACENTESIS  04/23/2021     Social History:   reports that he has never smoked. He has never used smokeless tobacco. He reports that he does not currently use alcohol. He reports that he does not use drugs.   Family History:  His family history is not on file.   Allergies Allergies   Allergen Reactions   Guaifenesin Swelling and Rash    Hand and feet swelling from Robitussin   Influenza Vaccines Other (See Comments)    Caused flu-like illness   Lexapro [Escitalopram] Nausea And Vomiting    Reported by [facility] - pt does not recall     Home Medications  Prior to Admission medications   Medication Sig Start Date End Date Taking? Authorizing Provider  acetaminophen (TYLENOL) 500 MG tablet Take 500 mg by mouth daily as needed for headache (pain).   Yes [provider]  amLODipine (NORVASC) 10 MG tablet Take 10 mg by mouth every morning. 04/11/12  Yes [provider]  aspirin EC 81 MG tablet 81 mg every morning.   Yes [provider]  atropine 1 % ophthalmic solution Place 1 drop into the right eye at bedtime. 11/11/19  Yes [provider]  brimonidine (ALPHAGAN) 0.2 % ophthalmic solution Place 1 drop into the left eye 3 (three) times daily. 02/22/21  Yes [provider]  cholecalciferol (VITAMIN D3) 25 MCG (1000 UNIT) tablet Take 1,000 Units by mouth 2 (two) times daily.   Yes [provider]  dorzolamide-timolol (COSOPT) 22.3-6.8 MG/ML ophthalmic solution Place 1 drop into the left eye 2 (two) times daily. 04/11/12  Yes [provider]  doxazosin (CARDURA) 8 MG tablet Take 1 tablet (8 mg total) by mouth daily. Patient taking differently: Take 8 mg by mouth every morning. 11/13/20  Yes Stoioff, Ronda Fairly, MD  dutasteride (AVODART) 0.5 MG capsule Take 1 capsule (0.5 mg total) by mouth daily. Patient taking differently: Take 0.5 mg by mouth every Monday, Wednesday, and Friday. 11/13/20  Yes Stoioff, Ronda Fairly, MD  furosemide (LASIX) 40 MG tablet Take 40 mg by mouth every morning. 11/18/20  Yes [provider]  Garlic 683 MG TABS Take 500 mg by mouth every morning.   Yes [provider]  latanoprost (XALATAN) 0.005 % ophthalmic solution Place 1 drop into the left eye at bedtime. 04/11/12  Yes  [provider]  metFORMIN (GLUCOPHAGE) 500 MG tablet Take 250 mg by mouth 2 (two) times daily. 03/20/13  Yes [provider]  metoprolol tartrate (LOPRESSOR) 50 MG tablet Take 50 mg by mouth 2 (two) times daily. 05/23/14  Yes [provider]  Multiple Vitamin (MULTIVITAMIN WITH MINERALS) TABS tablet Take 1 tablet by mouth in the morning.   Yes [provider]  Multiple Vitamins-Minerals (PRESERVISION AREDS 2) CAPS Take 1 capsule by mouth 2 (two) times daily.   Yes [provider]  olmesartan-hydrochlorothiazide (BENICAR HCT) 40-12.5 MG tablet Take 1 tablet by mouth every morning. 07/16/12  Yes [provider]  OVER THE COUNTER MEDICATION Take 1 capsule by mouth See admin instructions. Go Out (OTC) - take one capsule by mouth on Tuesday and Thursday morning (for gout)   Yes [provider]  pilocarpine (PILOCAR) 4 % ophthalmic solution Place 1 drop into the left eye 3 (three) times daily. 06/20/17  Yes [provider]  potassium chloride SA (KLOR-CON M) 20 MEQ tablet Take 10 mEq by mouth every morning.   Yes [provider]  prednisoLONE acetate (PRED FORTE) 1 % ophthalmic suspension Place 1 drop into the right eye at bedtime. 11/12/19  Yes [provider]  triamcinolone ointment (KENALOG) 0.1 % Apply 1 application topically See admin instructions. Apply topically legs nightly after compression stockings are removed 11/20/20  Yes [provider]  vitamin C (ASCORBIC ACID) 500 MG tablet Take 500 mg by mouth every Saturday.   Yes [provider]  vitamin E 180 MG (400 UNITS) capsule Take 400 Units by mouth every morning.   Yes [provider]   Dr. Jose Persia Internal Medicine PGY-3  04/25/2021, 10:26 AM

## 2021-04-25 NOTE — Consult Note (Signed)
Palliative Medicine Inpatient Consult Note  Consulting Provider: Kerney Elbe, DO  Reason for consult:   Austin Wheeler Palliative Medicine Consult  Reason for Consult? GOC    HPI:  Per intake H&P --> Austin Wheeler is a 81 y.o. male with medical history significant of HTN, IIDM, BPH, legally blind in both eyes, came with decreased urine output, swelling in abdomen bilateral lower extremities. Noted to have Cr of 16.32 and BUN of 124. Palliative care has been asked to get involved to further address goals of care and possible dialysis.  Clinical Assessment/Goals of Care:  *Please note that this is a verbal dictation therefore any spelling or grammatical errors are due to the "[employer] One" system interpretation.  I have reviewed medical records including EPIC notes, labs and imaging, received report from bedside RN, assessed the patient.    I met with Austin Wheeler to further discuss diagnosis prognosis, GOC, EOL wishes, disposition and options.   I introduced Palliative Medicine as specialized medical care for people living with serious illness. It focuses on providing relief from the symptoms and stress of a serious illness. The goal is to improve quality of life for both the patient and the family.  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  He shares that this has changed his life dramatically.  Social History:  Austin Wheeler is from [place].  He has lived in [place] for the greater part of his wife with his spouse, Austin Wheeler.  They have been married for the  past 77 years and share 1 son and 3 grandchildren.  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  Prior to legal blindness he enjoyed fishing, reading, cooking.  He is a man who enjoys war ship and practices within the [facility] [employer] denomination.  Functional and Nutritional State:  Prior to admission Austin Wheeler was able to do most B ADLs though he did rely upon his wife to lay his close out for him and as of more recently his wife has been helping him to place his pants around his waist.  He utilizes a walker at home.  Austin Wheeler shares prior to about 3 weeks ago he had a wonderful appetite and loved eating.  He shared that at that time food started not tasting right and he describes anything he put in his mouth is very bland like "rope".  Advance Directives: A detailed discussion was had today regarding advanced directives.  Patient's surrogate decision maker is his spouse, Austin Wheeler though [name] presently is able to make all healthcare decisions for himself.  Code Status:  Austin Wheeler expresses already being DNR/DNI code status understanding evidenced based poor outcomes in similar hospitalized patient, as the cause of arrest is likely associated with advanced chronic/terminal illness rather than an easily reversible acute cardio-pulmonary event. I explained that DNR/DNI does not change the medical plan and it only comes into effect after a person has arrested (died).  It is a protective measure to keep [place] from harming the patient in their last moments of life. [place] remains agreeable to DNR/DNI with understanding that patient  would not receive CPR, defibrillation, ACLS medications, or intubation.   Discussion:  A formal review of Austin Wheeler kidney function was held.  We focused quite a bit in conversation on the need for hemodialysis given his compromised kidney numbers and physical indicators.  Reviewed with him whether or not he  would want hemodialysis.  Shared that often this is something whereby a catheter needs to be placed with that carries the possible risk of infection though the purpose of this is so that a machine can filter his blood as your kidneys would normally do and enable "cleaning of the blood".  Austin Wheeler shares that he had been on the fence about getting this treatment in the past though at this point in time if this is the only modality that we will keep him here on earth he would be open to it and willing to pursue it.  Austin Wheeler and I talked about the best case worst-case scenarios of hemodialysis.  I shared that the best case is his body tolerates it well and he starts to have improvement in his swelling, his cognition, his laboratory results and electrolytes.  I shared that the worst case is that he does not tolerate dialysis.  In the instance that he does not tolerate dialysis or finds it is too burdensome to continue with treatments he would then be transitioned to more of a comfort modality of care and I did broach the topic of hospice.  For the time being Austin Wheeler would like to pursue dialysis.  Discussed the importance of continued conversation with family and their  medical providers regarding overall plan of care and treatment options, ensuring decisions are within the context of the patients values and GOCs.  Decision Maker: Austin Wheeler (spouse) [phone]  SUMMARY OF RECOMMENDATIONS   DNAR/DNI  Would be interested in starting dialysis if offered  Discussed the best case worst-case scenarios associated with dialysis based upon patient tolerance  Reviewed the importance of quality in patient's life  Spiritual support as patient is of the Austin Wheeler faith  Ongoing palliative care involvement will occur incrementally  Code Status/Advance Care Planning: DNAR/DNI  Palliative Prophylaxis:  Aspiration, Bowel Regimen, Delirium Protocol, Frequent Pain Assessment, Oral Care, Palliative Wound  Care, and Turn Reposition  Additional Recommendations (Limitations, Scope, Preferences): Continue current course of care  Psycho-social/Spiritual:  Desire for further Chaplaincy support: Yes-[employer] Additional Recommendations: Education on end-stage renal disease   Prognosis: Dependent upon patient's tolerance of dialysis.  Discharge Planning: Discharge plan uncertain at this point time  Vitals:   04/24/21 1934 04/25/21 0515  BP: 118/78 98/74  Pulse: 79 72  Resp: 14 16  Temp: (!) 96.4 F (35.8 C) (!) 97.5 F (36.4 C)  SpO2: 95% 95%    Intake/Output Summary (Last 24 hours) at 04/25/2021 3875 Last data filed at 04/25/2021 0500 Gross per 24 hour  Intake 146 ml  Output 50 ml  Net 96 ml   Last Weight  Most recent update: 04/23/2021  7:20 PM    Weight  122.2 kg (269 lb 6.4 oz)            Gen: Elderly African-American male chronically ill in appearance, in no acute distress HEENT: moist mucous membranes CV: Regular rate and rhythm PULM: On room breathing is even and nonlabored ABD: soft/nontender EXT: (+) BLE edema Neuro: Alert and oriented x3 - Blind  PPS: 50-60%   This conversation/these recommendations were discussed with patient primary care team, Dr. Alfredia Ferguson  MDM [facility] ______________________________________________________ Austin Wheeler  Palliative Medicine Team Team Cell Phone: [phone] Please utilize secure chat with additional questions, if there is no response within 30 minutes please call the above phone number  Palliative Medicine Team providers are available by phone from 7am to 7pm daily and can be reached through the team cell phone.  Should this patient require assistance outside of these hours, please call the patient's attending physician.

## 2021-04-25 NOTE — Progress Notes (Signed)
Patient ID: Austin Wheeler, male   DOB: 14-Nov-1940, 81 y.o.   MRN: [id number] S: Met with [name] as well as Austin Wheeler.  Austin Wheeler has agreed to try CVVHD and eventual kidney biopsy.  I spoke with Dr. Alfredia Ferguson and will plan to transfer Austin Wheeler to ICU for HD cath placement and initiation of CVVHD.  Austin Wheeler did undergo large volume paracentesis of 8.3 liters yesterday.  O:BP 110/81 (BP Location: Right Arm)    Pulse 90    Temp (!) 97.5 F (36.4 C) (Oral)    Resp 16    Ht '6\' 6"'  (1.981 m)    Wt 122.2 kg    SpO2 95%    BMI 31.13 kg/m   Intake/Output Summary (Last 24 hours) at 04/25/2021 1030 Last data filed at 04/25/2021 0800 Gross per 24 hour  Intake 246 ml  Output 50 ml  Net 196 ml   Intake/Output: I/O last 3 completed shifts: In: 240.9 [P.O.:80; IV Piggyback:160.9] Out: 650 [Urine:650]  Intake/Output this shift:  Total I/O In: 100 [P.O.:100] Out: -  Weight change:  Gen:NAD CVS:RRR Resp:diminished BS at bases VOZ:DGUYQIHKV, + fluid wave, NT Ext:3+ anasarca to mid chest  Recent Labs  Lab 04/23/21 1137 04/23/21 1329 04/23/21 2021 04/24/21 0546 04/25/21 0231  NA 133* 131* 132* 132* 134*  K 6.1* 5.9* 5.7* 5.7* 5.9*  CL 99  --  100 100 102  CO2 12*  --  13* 13* 15*  GLUCOSE 67*  --  106* 107* 88  BUN 118*  --  122* 120* 124*  CREATININE 15.52*  --  16.13* 16.12* 16.32*  ALBUMIN 3.4*  --   --  3.3* 3.4*  CALCIUM 9.6  --  9.3 9.0 9.2  PHOS  --   --   --  6.2* 6.3*  AST 18  --   --   --  16  ALT 11  --   --   --  10   Liver Function Tests: Recent Labs  Lab 04/23/21 1137 04/24/21 0546 04/25/21 0231  AST 18  --  16  ALT 11  --  10  ALKPHOS 80  --  62  BILITOT 1.2  --  1.1  PROT 7.1  --  6.1*  ALBUMIN 3.4* 3.3* 3.4*   No results for input(s): LIPASE, AMYLASE in the last 168 hours. No results for input(s): AMMONIA in the last 168 hours. CBC: Recent Labs  Lab 04/23/21 1137 04/23/21 1329 04/24/21 0344 04/25/21 0231  WBC 6.6  --  6.0 7.6  NEUTROABS 5.3  --   --  6.6  HGB  12.7* 13.3 12.5* 12.9*  HCT 38.5* 39.0 38.0* 37.0*  MCV 98.0  --  97.7 93.4  PLT 143*  --  109* 125*   Cardiac Enzymes: No results for input(s): CKTOTAL, CKMB, CKMBINDEX, TROPONINI in the last 168 hours. CBG: Recent Labs  Lab 04/24/21 0721 04/24/21 1204 04/24/21 1547 04/24/21 2135 04/25/21 0735  GLUCAP 101* 105* 87 87 79    Iron Studies: No results for input(s): IRON, TIBC, TRANSFERRIN, FERRITIN in the last 72 hours. Studies/Results: CT ABDOMEN PELVIS WO CONTRAST  Result Date: 04/23/2021 CLINICAL DATA:  Renal failure, anuria EXAM: CT ABDOMEN AND PELVIS WITHOUT CONTRAST TECHNIQUE: Multidetector CT imaging of the abdomen and pelvis was performed following the standard protocol without IV contrast. RADIATION DOSE REDUCTION: This exam was performed according to the departmental dose-optimization program which includes automated exposure control, adjustment of the mA and/or kV according to patient  size and/or use of iterative reconstruction technique. COMPARISON:  None. FINDINGS: Lower chest: Trace bilateral pleural effusions with associated compressive atelectasis. Cardiomegaly. Coronary artery atherosclerosis. Hepatobiliary: Unremarkable unenhanced appearance of the liver. No focal liver lesion identified. Gallbladder within normal limits. No hyperdense gallstone. No biliary dilatation. Pancreas: Unremarkable. No pancreatic ductal dilatation or surrounding inflammatory changes. Spleen: Normal in size without focal abnormality. Adrenals/Urinary Tract: Unremarkable adrenal glands. 3.3 cm lower pole left renal cyst. Kidneys are otherwise unremarkable. No renal stone or hydronephrosis. Urinary bladder is decompressed by Foley catheter. Stomach/Bowel: Small hiatal hernia. Stomach appears otherwise within normal limits. Colonic diverticulosis. No evidence of bowel wall thickening, distention, or inflammatory changes. Vascular/Lymphatic: Scattered aortoiliac atherosclerotic calcifications without  aneurysm. No abdominopelvic lymphadenopathy. Reproductive: Prostatomegaly. Other: Moderate-large volume ascites. No pneumoperitoneum. No abdominal wall hernia. Musculoskeletal: Diffuse anasarca. Scattered densely sclerotic bone islands within the pelvis and spine. No acute osseous findings. IMPRESSION: 1. No evidence of obstructive uropathy. 2. Moderate-large volume ascites with diffuse anasarca. 3. Trace bilateral pleural effusions with associated compressive atelectasis. 4. Colonic diverticulosis without evidence of acute diverticulitis. 5. Prostatomegaly. 6. Aortic Atherosclerosis (ICD10-I70.0). Electronically Signed   By: Davina Poke D.O.   On: 04/23/2021 14:13   US RENAL  Result Date: 04/23/2021 CLINICAL DATA:  Acute kidney injury EXAM: RENAL / URINARY TRACT ULTRASOUND COMPLETE COMPARISON:  Same day CT. FINDINGS: Right Kidney: Renal measurements: 13.1 x 6.0 x 7.3 cm = volume: 300.4 mL. Increased renal cortical echogenicity. No hydronephrosis. Left Kidney: Renal measurements: 13.9 x 5.7 x 6.5 cm = volume: 269.0 mL. Increased renal cortical echogenicity. No hydronephrosis. There is a simple appearing cyst in the inferior pole measuring 4.3 x 3.0 x 2.8 cm. Bladder: Foley catheter in place. Other: Bilateral pleural effusions.  Large volume ascites. IMPRESSION: Increased renal cortical echogenicity bilaterally, as can be seen in medical renal disease. No hydronephrosis. Bilateral pleural effusions and large volume ascites noted. Electronically Signed   By: Maurine Simmering M.D.   On: 04/23/2021 14:52   DG Chest Portable 1 View  Result Date: 04/23/2021 CLINICAL DATA:  81 year old male with dyspnea, atrial fibrillation. EXAM: PORTABLE CHEST - 1 VIEW COMPARISON:  None. FINDINGS: The mediastinal contours are within normal limits. Cardiomegaly. Low lung volumes. Mild bibasilar streaky opacities. No evidence of significant pleural effusion or pneumothorax. No acute osseous abnormality. IMPRESSION: Cardiomegaly  with low lung volumes and bibasilar subsegmental atelectasis. Electronically Signed   By: Ruthann Cancer M.D.   On: 04/23/2021 12:35   ECHOCARDIOGRAM COMPLETE  Result Date: 04/24/2021    ECHOCARDIOGRAM REPORT   Patient Name:   CHRISTEN BEDOYA Date of Exam: 04/24/2021 Medical Rec #:  [id number]      Height:       78.0 in Accession #:    [id number]     Weight:       269.4 lb Date of Birth:  January 18, 1941      BSA:          2.562 m Patient Age:    57 years       BP:           101/75 mmHg Patient Gender: M              HR:           68 bpm. Exam Location:  Inpatient Procedure: 2D Echo, Color Doppler and Cardiac Doppler Indications:    D32.20 Acute diastolic (congestive) heart failure  History:        Patient has no prior history of  Echocardiogram examinations.                 Risk Factors:Hypertension and Diabetes.  Sonographer:    Raquel Sarna Senior RDCS Referring Phys: [id number] Secretary  1. Left ventricular ejection fraction, by estimation, is 55 to 60%. The left ventricle has normal function. The left ventricle has no regional wall motion abnormalities. The left ventricular internal cavity size was moderately dilated. Left ventricular diastolic parameters are indeterminate. There is the interventricular septum is flattened in systole and diastole, consistent with right ventricular pressure and volume overload.  2. Right ventricular systolic function is moderately reduced. The right ventricular size is severely enlarged. There is moderately elevated pulmonary artery systolic pressure.  3. Left atrial size was mildly dilated.  4. Right atrial size was severely dilated.  5. Moderate pleural effusion in the left lateral region.  6. The mitral valve is normal in structure. Mild mitral valve regurgitation. No evidence of mitral stenosis.  7. Tricuspid valve regurgitation is moderate to severe.  8. The aortic valve is tricuspid. Aortic valve regurgitation is trivial. Aortic valve sclerosis is present, with no  evidence of aortic valve stenosis.  9. Aortic dilatation noted. There is mild dilatation of the aortic root, measuring 43 mm. There is mild dilatation of the ascending aorta, measuring 43 mm. There is mild dilatation of the aortic arch, measuring 43 mm. 10. The inferior vena cava is dilated in size with <50% respiratory variability, suggesting right atrial pressure of 15 mmHg. Comparison(s): No prior Echocardiogram. FINDINGS  Left Ventricle: Left ventricular ejection fraction, by estimation, is 55 to 60%. The left ventricle has normal function. The left ventricle has no regional wall motion abnormalities. The left ventricular internal cavity size was moderately dilated. There is no left ventricular hypertrophy. The interventricular septum is flattened in systole and diastole, consistent with right ventricular pressure and volume overload. Left ventricular diastolic parameters are indeterminate. Right Ventricle: The right ventricular size is severely enlarged. Right ventricular systolic function is moderately reduced. There is moderately elevated pulmonary artery systolic pressure. The tricuspid regurgitant velocity is 2.89 m/s, and with an assumed right atrial pressure of 15 mmHg, the estimated right ventricular systolic pressure is 06.2 mmHg. Left Atrium: Left atrial size was mildly dilated. Right Atrium: Right atrial size was severely dilated. Pericardium: Trivial pericardial effusion is present. Mitral Valve: The mitral valve is normal in structure. Mild mitral valve regurgitation. No evidence of mitral valve stenosis. Tricuspid Valve: The tricuspid valve is normal in structure. Tricuspid valve regurgitation is moderate to severe. No evidence of tricuspid stenosis. Aortic Valve: The aortic valve is tricuspid. Aortic valve regurgitation is trivial. Aortic valve sclerosis is present, with no evidence of aortic valve stenosis. Pulmonic Valve: The pulmonic valve was normal in structure. Pulmonic valve regurgitation  is mild. No evidence of pulmonic stenosis. Aorta: Aortic dilatation noted. There is mild dilatation of the aortic root, measuring 43 mm. There is mild dilatation of the ascending aorta, measuring 43 mm. There is mild dilatation of the aortic arch, measuring 43 mm. Venous: The inferior vena cava is dilated in size with less than 50% respiratory variability, suggesting right atrial pressure of 15 mmHg. IAS/Shunts: No atrial level shunt detected by color flow Doppler. Additional Comments: There is a moderate pleural effusion in the left lateral region.  LEFT VENTRICLE PLAX 2D LVIDd:         6.30 cm LVIDs:         4.60 cm LV PW:  0.90 cm LV IVS:        1.20 cm LVOT diam:     2.10 cm LV SV:         51 LV SV Index:   20 LVOT Area:     3.46 cm  RIGHT VENTRICLE RV S prime:     10.70 cm/s TAPSE (M-mode): 1.9 cm LEFT ATRIUM             Index        RIGHT ATRIUM           Index LA diam:        4.50 cm 1.76 cm/m   RA Area:     39.30 cm LA Vol (A2C):   96.8 ml 37.78 ml/m  RA Volume:   169.00 ml 65.96 ml/m LA Vol (A4C):   94.6 ml 36.92 ml/m LA Biplane Vol: 99.4 ml 38.79 ml/m  AORTIC VALVE LVOT Vmax:   72.97 cm/s LVOT Vmean:  52.700 cm/s LVOT VTI:    0.148 m  AORTA Ao Root diam: 4.30 cm Ao Asc diam:  4.30 cm MITRAL VALVE               TRICUSPID VALVE MV Area (PHT): 3.42 cm    TR Peak grad:   33.4 mmHg MV Decel Time: 222 msec    TR Vmax:        289.00 cm/s MV E velocity: 75.50 cm/s MV A velocity: 26.20 cm/s  SHUNTS MV E/A ratio:  2.88        Systemic VTI:  0.15 m                            Systemic Diam: 2.10 cm Kirk Ruths MD Electronically signed by Kirk Ruths MD Signature Date/Time: 04/24/2021/1:50:43 PM    Final    IR Paracentesis  Result Date: 04/24/2021 INDICATION: Patient history congestive heart failure, admitted with acute renal and anasarca found to have new onset ascites. Request IR for therapeutic paracentesis EXAM: ULTRASOUND GUIDED THERAPEUTIC PARACENTESIS MEDICATIONS: 7 mL 1% lidocaine  COMPLICATIONS: None immediate. PROCEDURE: Informed written consent was obtained from the patient after a discussion of the risks, benefits and alternatives to treatment. A timeout was performed prior to the initiation of the procedure. Initial ultrasound scanning demonstrates a large amount of ascites within the right lower abdominal quadrant. The right lower abdomen was prepped and draped in the usual sterile fashion. 1% lidocaine was used for local anesthesia. Following this, a 19 gauge, 7-cm, Yueh catheter was introduced. An ultrasound image was saved for documentation purposes. The paracentesis was performed. The catheter was removed and a dressing was applied. The patient tolerated the procedure well without immediate post procedural complication. FINDINGS: A total of approximately 8.3 L of clear yellow fluid was removed. IMPRESSION: Successful ultrasound-guided paracentesis yielding 8.3 liters of peritoneal fluid. Read by Candiss Norse, PA-C Electronically Signed   By: Aletta Edouard M.D.   On: 04/24/2021 07:53    aspirin EC  81 mg Oral q morning   atropine  1 drop Right Eye QHS   brimonidine  1 drop Left Eye TID   Chlorhexidine Gluconate Cloth  6 each Topical Q0600   dorzolamide-timolol  1 drop Left Eye BID   insulin aspart  0-6 Units Subcutaneous TID WC   latanoprost  1 drop Left Eye QHS   metolazone  10 mg Oral Daily   pantoprazole  40 mg Oral Daily   pilocarpine  1 drop Left Eye TID   prednisoLONE acetate  1 drop Right Eye QHS   sodium bicarbonate  1,300 mg Oral BID   sodium zirconium cyclosilicate  10 g Oral Daily   triamcinolone ointment  1 application Topical Daily    BMET    Component Value Date/Time   NA 134 (L) 04/25/2021 0231   NA 144 05/13/2020 1051   K 5.9 (H) 04/25/2021 0231   CL 102 04/25/2021 0231   CO2 15 (L) 04/25/2021 0231   GLUCOSE 88 04/25/2021 0231   BUN 124 (H) 04/25/2021 0231   BUN 11 05/13/2020 1051   CREATININE 16.32 (H) 04/25/2021 0231   CALCIUM 9.2  04/25/2021 0231   GFRNONAA 3 (L) 04/25/2021 0231   GFRAA  02/06/2008 1915    >60        The eGFR has been calculated using the MDRD equation. This calculation has not been validated in all clinical   CBC    Component Value Date/Time   WBC 7.6 04/25/2021 0231   RBC 3.96 (L) 04/25/2021 0231   HGB 12.9 (L) 04/25/2021 0231   HCT 37.0 (L) 04/25/2021 0231   PLT 125 (L) 04/25/2021 0231   MCV 93.4 04/25/2021 0231   MCH 32.6 04/25/2021 0231   MCHC 34.9 04/25/2021 0231   RDW 14.8 04/25/2021 0231   LYMPHSABS 0.3 (L) 04/25/2021 0231   MONOABS 0.6 04/25/2021 0231   EOSABS 0.0 04/25/2021 0231   BASOSABS 0.0 04/25/2021 0231    Assessment/Plan:  Acute renal failure, anuric - appears to have started about 3 weeks ago when Austin Wheeler noted decreased UOP but has not had any UOP for several days.  No nephrotoxic agents identified and unable to send for UA.  Austin Wheeler has had proteinuria and hematuria in the past.  Austin Wheeler SCr was 0.81 11 months ago and only new medication was lasix for edema.  I discussed the possible need for dialysis with Austin Wheeler, but Austin Wheeler is not sure about proceeding with that and would like to try IV lasix first.  Possible cardiorenal syndrome in setting of anasarca, ATN if has had nephrotic range proteinuria, although serum alb is 3.4 (only mildly decreased), or acute GN.   Serologies for acute GN pending Will eventually need a kidney biopsy to help identify cause of ARF Started IV lasix 160 mg q6 and metolazone 10 mg before IV lasix dose without response. Will transfer to ICU to initiate CVVHD and UF as tolerated. Continue to hold ARB and metformin Keep foley catheter in place and follow UOP and Scr Renal dose meds for eGFR <10 Avoid nephrotoxic agents such as IV contrast, NSAIDs/Cox-II I's, and phosphate containing bowel preps (FLEETS) Anasarca - unclear etiology.  Possibly due to anuric ARF, Nephrotic syndrome, but ECHO with moderate RV failure so would benefit from Heart Failure team  consultation.  No history of liver disease and albumin only mildly decreased.  IV lasix and metolazone as above not effective so will start CVVHDF.  Atrial fibrillation - new onset.  Asymptomatic.  Rate controlled. Hyperkalemia - was on KCl supplements at home and now with anuric ARF.  Given IV insulin/D50, lokelma 10 grams and will start IV lasix.  Per ED no peaked t-waves. Metabolic acidosis - due to ARF and metformin use.  Metformin on hold.  Will start po bicarb. 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 Disposition - recommend palliative care consult to help set goals/limits of care.  Donetta Potts, MD [employer] [phone]

## 2021-04-25 NOTE — Plan of Care (Signed)

## 2021-04-25 NOTE — Progress Notes (Signed)
Progress Note   Patient: Austin Wheeler JKD:[id number] DOB: October 28, 1940 DOA: 04/23/2021     2 DOS: the patient was seen and examined on 04/25/2021   Brief hospital course: Austin Wheeler is a 81 y.o. male with medical history significant of HTN, IIDM, BPH, legally blind in both eyes, came with decreased urine output, swelling in abdomen bilateral lower extremities.   Patient started to have increasing bilateral lower extremities swelling about 3 months ago and was started of po Lasix 40 mg daily, swelling appears to be improving, patient however had episodes of feeling lightheadedness and "too much urination" and decided to cut down Lasix from 40 mg daily to 20 mg daily about 1 month ago.  Condition remained stable since until about 1 week ago, patient started to notice swelling in his abdomen and legs, and decreased urine output.  Finally, for the past 3 days, there has been barely any urine came out. He said he feeling "bladder fullness" when having BM, but no urine came out. No abdominal pain, no back pain, no shortness of breath, no fever or chills. No Hx of CHF or stroke.   ED Course: 1.  New A-fib on monitor, blood pressure on the lower side.   Blood work showed creatinine 15.5 compared to baseline less than 1 more than 10 months ago, BUN 18, bicarb 12, K6.1, lactic acid 3.8, albumin 3.4.   Lasix drip started by nephrology in ED.  **Interim History Patient had a paracentesis done yesterday at which drew off 8.5 L.  Nephrology following closely and had recommended dialysis but patient was hesitant.  They now placed the patient on Lasix 160 mg every 6 with minimal urine output improvement.  They also added metolazone 10 mg p.o. before next IV Lasix dose.  Given his hypotensive episode yesterday he was started on albumin x3.  Nephrology recommending continuing the catheter for now and he did have some hematuria so his heparin drip was held.  We will initiate once improved.  Avoiding nephrotoxic  medication.  Nephrology ordering an echo given his anasarca which showed diastolic dysfunction.  He has had no history of liver disease and albumin was only mildly decreased.  If patient does not continue to make urine we will need to have further discussion about dialysis and per my discussion with nephrology today and he will likely need CRRT given his softer blood pressures.  Patient is agreeable for Dialysis so will transfer to the ICU for CRRT per Nephrology Recc's.  Assessment and Plan: No notes have been filed under this hospital service. Service: Hospitalist  AKI, Anuria Hyperkalemia Hyperphosphatemia -Likely secondary to ATN; nephrology ruling out other conditions; his symptoms started about 3 weeks ago when he noticed decreased urinary output and has not had any urinary output for several days.  Nephrology signing off serologies for acute glomerulonephritis and will likely need a kidney biopsy to help identify the cause of his acute renal failure.  They have initiated diuretics with IV Lasix with only marginal urine output.  We have added metolazone and recommended continue hold ARB and metformin.  They are recommending continuing Foley and they have recommended trial of dialysis if Lasix does not improve however patient will remain reluctant to proceed with this at this time and family to have discussions about initiating dialysis. -With decompensated non-anion gap and anion gap metabolic acidosis, acute urea and azotemia and hyperkalemia -Nephrology consulted, patient was started on Lasix drip in ED this has been changed to scheduled Lasix. -  D/W patient and his wife at bedside, patient is DNR/DNI, however accept HD if necessary. BP on lower side, will admit to PCU for close monitoring. -Foley for accurate I/O -Patient is only -244.5 -Strict I's and O's and Daily Weights -Will bee transferred to the Intensive Care Unit for CRRT per Nephrology Recc's    Anasarca -Likely in setting of  renal failure -Nephrology does not feel that this is nephrotic syndrome given his protein and albumin but they have ordered an echo to evaluate his EF to evaluate for CHF and cardiorenal syndrome. -He has no history of liver disease and albumin is only mildly decreased so they have started him on IV Lasix and metolazone -Patient had a abdominal paracentesis as noted   Decompensated non-anion gap and anion gap metabolic acidosis -Start p.o. bicarb -Nephrology feels that this is secondary to ARF and metformin use -Patient's CO2 is 15, AG, is 17 and Chloride Level is 102 -Continue to Monitor and Trend    Acute uremia -No significant encephalopathy -BUN/Cr is now 124/16.32 -We will start GI prophylaxis with PPI daily   Hyperkalemia -No ST-T changes on EKG, received Lokelma, nephrology gave the patient insulin and D50 and as well as Lokelma and have started IV Lasix -Remains elevated at 5.7 yesterday and is now elevated to 5.9 -Continue to Monitor and Trend    New onset Afib -Rate controlled -CHADS2=3, on heparin drip but this has been held given his hematuria. No S/S of CHF, outpatient Echo and cardiology f/u. -Resume AC per PCCM Recc's    Elevated Lactate -No significant infection sources found, chest xray clear, no diarrhea. -Hold off ABX for now given that he is afebrile and has no white count   HTN -Hold all BP meds given that he was hypotensive after his paracentesis; received some Albumin -Continue to Monitor BP per Protocol  -Last BP was 92/62  Normocytic Anemia -Patient's Hgb/Hct went from 12.5/38.0 -> 12.9/37.0 -Check Anemia Panel in the AM -Continue to Monitor for S/Sx of Bleeding; No overt bleeding noted -Repeat CBC in the AM   Thrombocytopenia -Patient's Platelet Count was 109 -> 125 -Continue to Monitor and Replete as Necessary -Repeat CBC in the AM    IIDM -D/C Metformin -Start sliding scale -CBGs ranging from 79-105   BPH -C/w Foley for  now  Obesity -Complicates overall prognosis and care -Estimated body mass index is 32.76 kg/m as calculated from the following:   Height as of this encounter: 6\' 6"  (1.981 m).   Weight as of this encounter: 128.6 kg.  -Weight Loss and Dietary Counseling given  Subjective: Seen and examined at bedside and he was doing okay.  Denies any chest pain or shortness of breath.  Remains significantly volume overloaded.  Agreeable dialysis today.  Will transfer to the intensive care unit for CRRT.  Physical Exam: Vitals:   04/25/21 0515 04/25/21 0806 04/25/21 1046 04/25/21 1137  BP: 98/74 110/81 92/62   Pulse: 72 90 82   Resp: 16 16 15    Temp: (!) 97.5 F (36.4 C)  (!) 97.5 F (36.4 C) (!) 97.3 F (36.3 C)  TempSrc: Oral  Oral Oral  SpO2: 95%  96%   Weight:    128.6 kg  Height:       Examination: Physical Exam:  Constitutional: WN/WD elderly obese African-American male currently in, NAD appears calm Eyes: Patient is blind Respiratory: Diminished to auscultation bilaterally coarse breath sounds, no wheezing, rales, rhonchi or crackles. Normal respiratory effort and patient is  not tachypenic. No accessory muscle use. Unlabored breathing with coarse breath sounds Cardiovascular: Tachycardic Rate but Regular Rhythm, no murmurs / rubs / gallops. S1 and S2 auscultated. 3+ LE Edema  Abdomen: Soft, non-tender,  distended 2/2 body habitus. Bowel sounds positive.  GU: Deferred. Musculoskeletal: No clubbing / cyanosis of digits/nails. No joint deformity upper and lower extremities.  Skin: No rashes, lesions, ulcers on a limited skin evaluation. No induration; Warm and dry.   Data Reviewed:  I have independently reviewed and assessed the patient's clinical laboratory data and he has a hyponatremia of 134, potassium of 5.9, chloride level of 15, BUN/creatinine of 124/16.32, anion gap of 17, phosphorus is 6.3, magnesium 2.8, albumin of 3.4, and hemoglobin/hematocrit of 12.9/37.0 as well as a  platelet count of 125  Family Communication: Discussed with Wife and Son at bedside  Disposition: Status is: Inpatient Remains inpatient appropriate because: Remains massively volume overloaded and will be transferred to the ICU   Planned Discharge Destination:  TBD  DVT Prophylaxis: Held [facility] and will order SCDs  Author: Kerney Elbe, DO [facility] 04/25/2021 11:38 AM  For on call review [email or web address].

## 2021-04-25 NOTE — Procedures (Signed)
Central Venous Catheter Insertion Procedure Note  JILBERTO VANDERWALL  [id number]  06-14-1940  Date:04/25/21  Time:3:08 PM   Provider Performing:Abimbola Aki Charleen Kirks   Procedure: Insertion of Non-tunneled Central Venous Catheter(36556)with US guidance (54562)    Indication(s) Hemodialysis  Consent Risks of the procedure as well as the alternatives and risks of each were explained to the patient and/or caregiver.  Consent for the procedure was obtained and is signed in the bedside chart  Anesthesia Topical only with 1% lidocaine   Timeout Verified patient identification, verified procedure, site/side was marked, verified correct patient position, special equipment/implants available, medications/allergies/relevant history reviewed, required imaging and test results available.  Sterile Technique Maximal sterile technique including full sterile barrier drape, hand hygiene, sterile gown, sterile gloves, mask, hair covering, sterile ultrasound probe cover (if used).  Procedure Description Area of catheter insertion was cleaned with chlorhexidine and draped in sterile fashion.   With real-time ultrasound guidance a HD catheter was placed into the right internal jugular vein.  Nonpulsatile blood flow and easy flushing noted in all ports.  The catheter was sutured in place and sterile dressing applied.  Complications/Tolerance None; patient tolerated the procedure well. Chest X-ray is ordered to verify placement for internal jugular or subclavian cannulation.  Chest x-ray is not ordered for femoral cannulation.  EBL Minimal  Specimen(s) None

## 2021-04-26 DIAGNOSIS — N179 Acute kidney failure, unspecified: Secondary | ICD-10-CM | POA: Diagnosis not present

## 2021-04-26 DIAGNOSIS — L8990 Pressure ulcer of unspecified site, unspecified stage: Secondary | ICD-10-CM | POA: Insufficient documentation

## 2021-04-26 DIAGNOSIS — E43 Unspecified severe protein-calorie malnutrition: Secondary | ICD-10-CM | POA: Insufficient documentation

## 2021-04-26 LAB — HEPATITIS B SURFACE ANTIGEN: Hepatitis B Surface Ag: NONREACTIVE

## 2021-04-26 LAB — GLUCOSE, CAPILLARY
Glucose-Capillary: 64 mg/dL — ABNORMAL LOW (ref 70–99)
Glucose-Capillary: 65 mg/dL — ABNORMAL LOW (ref 70–99)
Glucose-Capillary: 67 mg/dL — ABNORMAL LOW (ref 70–99)
Glucose-Capillary: 79 mg/dL (ref 70–99)
Glucose-Capillary: 80 mg/dL (ref 70–99)
Glucose-Capillary: 91 mg/dL (ref 70–99)

## 2021-04-26 LAB — COMPREHENSIVE METABOLIC PANEL
ALT: 9 U/L (ref 0–44)
AST: 19 U/L (ref 15–41)
Albumin: 3.4 g/dL — ABNORMAL LOW (ref 3.5–5.0)
Alkaline Phosphatase: 65 U/L (ref 38–126)
Anion gap: 15 (ref 5–15)
BUN: 100 mg/dL — ABNORMAL HIGH (ref 8–23)
CO2: 17 mmol/L — ABNORMAL LOW (ref 22–32)
Calcium: 9 mg/dL (ref 8.9–10.3)
Chloride: 102 mmol/L (ref 98–111)
Creatinine, Ser: 13.05 mg/dL — ABNORMAL HIGH (ref 0.61–1.24)
GFR, Estimated: 4 mL/min — ABNORMAL LOW (ref >60)
Glucose, Bld: 74 mg/dL (ref 70–99)
Potassium: 5.5 mmol/L — ABNORMAL HIGH (ref 3.5–5.1)
Sodium: 134 mmol/L — ABNORMAL LOW (ref 135–145)
Total Bilirubin: 1.2 mg/dL (ref 0.3–1.2)
Total Protein: 6.4 g/dL — ABNORMAL LOW (ref 6.5–8.1)

## 2021-04-26 LAB — CBC WITH DIFFERENTIAL/PLATELET
Abs Immature Granulocytes: 0.03 10*3/uL (ref 0.00–0.07)
Basophils Absolute: 0 10*3/uL (ref 0.0–0.1)
Basophils Relative: 0 %
Eosinophils Absolute: 0 10*3/uL (ref 0.0–0.5)
Eosinophils Relative: 0 %
HCT: 37.7 % — ABNORMAL LOW (ref 39.0–52.0)
Hemoglobin: 12.9 g/dL — ABNORMAL LOW (ref 13.0–17.0)
Immature Granulocytes: 1 %
Lymphocytes Relative: 6 %
Lymphs Abs: 0.4 10*3/uL — ABNORMAL LOW (ref 0.7–4.0)
MCH: 31.6 pg (ref 26.0–34.0)
MCHC: 34.2 g/dL (ref 30.0–36.0)
MCV: 92.4 fL (ref 80.0–100.0)
Monocytes Absolute: 0.9 10*3/uL (ref 0.1–1.0)
Monocytes Relative: 14 %
Neutro Abs: 5.1 10*3/uL (ref 1.7–7.7)
Neutrophils Relative %: 79 %
Platelets: 111 10*3/uL — ABNORMAL LOW (ref 150–400)
RBC: 4.08 MIL/uL — ABNORMAL LOW (ref 4.22–5.81)
RDW: 14.6 % (ref 11.5–15.5)
WBC: 6.4 10*3/uL (ref 4.0–10.5)
nRBC: 0 % (ref 0.0–0.2)

## 2021-04-26 LAB — RETICULOCYTES
Immature Retic Fract: 3.6 % (ref 2.3–15.9)
RBC.: 4.1 MIL/uL — ABNORMAL LOW (ref 4.22–5.81)
Retic Count, Absolute: 30 10*3/uL (ref 19.0–186.0)
Retic Ct Pct: 0.7 % (ref 0.4–3.1)

## 2021-04-26 LAB — RENAL FUNCTION PANEL
Albumin: 3.3 g/dL — ABNORMAL LOW (ref 3.5–5.0)
Anion gap: 16 — ABNORMAL HIGH (ref 5–15)
BUN: 77 mg/dL — ABNORMAL HIGH (ref 8–23)
CO2: 20 mmol/L — ABNORMAL LOW (ref 22–32)
Calcium: 8.9 mg/dL (ref 8.9–10.3)
Chloride: 99 mmol/L (ref 98–111)
Creatinine, Ser: 10.65 mg/dL — ABNORMAL HIGH (ref 0.61–1.24)
GFR, Estimated: 4 mL/min — ABNORMAL LOW (ref >60)
Glucose, Bld: 83 mg/dL (ref 70–99)
Phosphorus: 4.2 mg/dL (ref 2.5–4.6)
Potassium: 5.3 mmol/L — ABNORMAL HIGH (ref 3.5–5.1)
Sodium: 135 mmol/L (ref 135–145)

## 2021-04-26 LAB — PHOSPHORUS: Phosphorus: 5.1 mg/dL — ABNORMAL HIGH (ref 2.5–4.6)

## 2021-04-26 LAB — FOLATE: Folate: 8.6 ng/mL (ref >5.9)

## 2021-04-26 LAB — GLOMERULAR BASEMENT MEMBRANE ANTIBODIES: GBM Ab: <0.2 units (ref 0.0–0.9)

## 2021-04-26 LAB — IRON AND TIBC
Iron: 151 ug/dL (ref 45–182)
Saturation Ratios: 94 % — ABNORMAL HIGH (ref 17.9–39.5)
TIBC: 161 ug/dL — ABNORMAL LOW (ref 250–450)
UIBC: 10 ug/dL

## 2021-04-26 LAB — FERRITIN: Ferritin: 762 ng/mL — ABNORMAL HIGH (ref 24–336)

## 2021-04-26 LAB — ANTI-DNA ANTIBODY, DOUBLE-STRANDED: ds DNA Ab: <1 IU/mL (ref 0–9)

## 2021-04-26 LAB — HEPATITIS C ANTIBODY: HCV Ab: NONREACTIVE

## 2021-04-26 LAB — VITAMIN B12: Vitamin B-12: 886 pg/mL (ref 180–914)

## 2021-04-26 LAB — MAGNESIUM: Magnesium: 2.7 mg/dL — ABNORMAL HIGH (ref 1.7–2.4)

## 2021-04-26 MED ORDER — ENSURE ENLIVE PO LIQD
237.0000 mL | Freq: Three times a day (TID) | ORAL | Status: DC
  Administered 2021-04-26 – 2021-05-14 (×37): 237 mL via ORAL

## 2021-04-26 MED ORDER — DEXTROSE 50 % IV SOLN
INTRAVENOUS | Status: AC
  Administered 2021-04-26: 25 mL
  Filled 2021-04-26: qty 50

## 2021-04-26 NOTE — Progress Notes (Signed)
NAME:  Austin Wheeler, MRN:  [id number], DOB:  1941/01/28, LOS: 3 ADMISSION DATE:  04/23/2021, CONSULTATION DATE:  2/26 REFERRING MD:  Alfredia Ferguson, CHIEF COMPLAINT:  AKI   History of Present Illness:  Austin Wheeler is a 81 yo male with PMH HTN, IIDM, BPH, legally blind in both eyes, came with decreased urine output, swelling in abdomen bilateral lower extremities.   Patient started to have increasing bilateral lower extremities swelling about 3 months ago and was started of po Lasix 40 mg daily, swelling appears to be improving, patient however had episodes of feeling lightheadedness and "too much urination" and decided to cut down Lasix from 40 mg daily to 20 mg daily about 1 month ago. Condition remained stable since until about 1 week ago, patient started to notice swelling in his abdomen and legs, and decreased urine output.  Finally, for the past 3 days, there has been barely any urine came out. He said he feeling "bladder fullness" when having BM, but no urine came out. No abdominal pain, no back pain, no shortness of breath, no fever or chills. No Hx of CHF or stroke.  Pertinent  Medical History  HTN, IIDM, BPH, legally blind in both eyes  Significant Hospital Events: Including procedures, antibiotic start and stop dates in addition to other pertinent events   2/24: Admitted to [facility] for acute on chronic renal failure, new onset a. Fib. Nephrology consulted; high dose Lasix trial initiated, large volume paracentesis of 8.3 L 2/26: PCCM consulted. Transfer to ICU for CRRT   Interim History / Subjective:  O/N: hypothermic, bright red output from foley catheter, remains in Afib  Patient assessed at bedside this AM. He states that he was able to get some rest overnight. His main concern is that food continues to taste abnormal, "rope-like".  Objective   Blood pressure 111/84, pulse 76, temperature (!) 97.2 F (36.2 C), temperature source Oral, resp. rate 17, height 6\' 6"  (1.981 m), weight 126.8  kg, SpO2 94 %.        Intake/Output Summary (Last 24 hours) at 04/26/2021 0623 Last data filed at 04/26/2021 0600 Gross per 24 hour  Intake 948.41 ml  Output 1651 ml  Net -702.59 ml   Filed Weights   04/23/21 1900 04/25/21 1137 04/26/21 0304  Weight: 122.2 kg 128.6 kg 126.8 kg    Examination: General: sitting up in bed with CRRT ongoing, in no acute distress HENT: legally blind, NCAT Lungs: CTAB, normal work of breathing on room air Cardiovascular: Irregularly irregular, no m/r/g Abdomen: distended and tympanic on percussion, nontender, active bowel sounds Extremities: 3+ pitting edema of bilateral lower extremities up to knees Neuro: alert and oriented x4 GU: deferred  Hgb 12.9 Platelets 125-> 111 K 5.5 Bicarb 17 BUN 124-> 100 Creatinine 16-> 13 AG 15 Net -1L Mg 2.7 Iron 151, TIBC low Rectulocyte consistent with hypoproliferation B12, folate wnl TTE- EF 55-60%, left ventricle with no regional wall motion abnormalities, right ventricular systolic fx moderately reduced, rt ventricular size is severely enlarged with moderately elevated pulmonary artery systolic pressure.   Resolved Hospital Problem list     Assessment & Plan:   Acute Kidney Injury w/ Anuria  Uremic Encephalopathy  HD catheter placed 2/26 for CRRT. Patient is net -1L. Previous creatinine 0.78 in May 2022.Marland Kitchen Differential includes ATN versus nephrotic syndrome versus acute GN given hematuria.  Antistrep O, C3, C4, total complement -ve. He is having frequent bladder flushes with gross hematuria with clots.   -Nephrology following, appreciate  their recommendations, patient will eventually need kidney biopsy - f/u serologies for acute GN including anti-DNA, glomerular basement membrane, ANA, ANCA profile, Kappa/ lambda light chain, hep b, and hep c - CRRT per Nephrology, discontinuing lasix and metolazone - Continue foley catheter - Continue strict UOP monitoring - Continue monitor daily creatinine    Hyperkalemia  Hyperphosphatemia  K 5.5, phos 5.1 - CRRT per nephrology   Anion Gap Metabolic Acidosis  Lactic Acidosis  NAGMA - In the setting of renal failure  - Continue bicarb infusion    New Onset A. Fib  CHA2DS2-VASc Score = 3.   - AC held due to significant hematuria with clots.  - No need for rate control at this time. Metoprolol 2.5 mg for HR > 110 available    Anasarca 2/2 Ascites  Suspected to be secondary to renal failure at this time although patient does have known history of HF. No history of liver disease. Large volume paracentesis 2/25 of 8.3 L. Unfortunately, peritoneal fluid not sent to lab. Albumin given with paracentesis.   RV Failure  History of CHF Current TTE with evidence of preserved LVEF but moderately reduced RVEF. Dilated morphology noted, however wonder if this may be secondary to profound hypervolemia.    - Hold off on Cardiology consultation for now    Normocytic Anemia  Thrombocytopenia  Iron studies cons - Monitor CBC daily - Transfuse for hemoglobin < 7  Elevated INR  Likely due to congestive hepatopathy.    - No indication for Vitamin K - Monitor INR daily   History of Prostate Adenocarcinoma   Localized only. Deferred surgical intervention; undergoing active surveillance with Urology.   Best Practice (right click and "Reselect all SmartList Selections" daily)   Diet/type: Regular consistency (see orders) DVT prophylaxis: SCD GI prophylaxis: PPI Lines: Dialysis Catheter Foley:  Yes, and it is still needed Code Status:  DNR Last date of multidisciplinary goals of care discussion [Wife and son updated at bedside 2/27]  Aja Bolander M. Barth Trella, D.O.  Internal Medicine Resident, PGY-1 [facility]  Pager: [phone] 6:23 AM, 04/26/2021

## 2021-04-26 NOTE — Progress Notes (Signed)
Flushed foley cath with 100cc's of NS and got back 200cc's of pink urine in return. Continuing to monitor patient.

## 2021-04-26 NOTE — Procedures (Signed)
Admit: 04/23/2021 LOS: 3  60M AKI (unclear cause) with remote hx/o hematuria/proteinuria and volume overload started CRRT 04/25/21  Current CRRT Prescription: Start Date: 04/25/21 Catheter: R IJ Temp Cath Placed 2/26 CCM BFR: 240 Pre Blood Pump: 400 4K DFR: 1500 4K Replacement Rate: 400 4K Goal UF: 100-251mL/h/net neg Anticoagulation: None Clotting: none since initiation  S: Tol CRRT overnight, now pulling 264mL/h net neg Remains on Furosemide Metolazone, will stop Req freq bladder flushes, has gross hematuria Remains very edematous   Latest Reference Range & Units 04/23/21 20:21  C3 Complement 82 - 167 mg/dL 84  Compl, Total (CH50) >41 U/mL 60  Complement C4, Body Fluid 12 - 38 mg/dL 20    Latest Reference Range & Units 04/23/21 20:21  ASO 0.0 - 200.0 IU/mL <20.0   Pending: ANA, dsDNA, ANCA, SPEP/SFLC, GBM Ab, HIV, HCV, HBV  2/24 Renal US: no obstruction, inc renal echogenicity noted  O: 02/26 0701 - 02/27 0700 In: 948.4 [P.O.:400; IV Piggyback:398.4] Out: 1748 [Urine:310]  Filed Weights   04/23/21 1900 04/25/21 1137 04/26/21 0304  Weight: 122.2 kg 128.6 kg 126.8 kg    Recent Labs  Lab 04/25/21 0231 04/25/21 1550 04/26/21 0305  NA 134* 132* 134*  K 5.9* 5.9* 5.5*  CL 102 100 102  CO2 15* 16* 17*  GLUCOSE 88 84 74  BUN 124* 124* 100*  CREATININE 16.32* 16.29* 13.05*  CALCIUM 9.2 9.2 9.0  PHOS 6.3* 6.5* 5.1*   Recent Labs  Lab 04/23/21 1137 04/23/21 1329 04/24/21 0344 04/25/21 0231 04/26/21 0305  WBC 6.6  --  6.0 7.6 6.4  NEUTROABS 5.3  --   --  6.6 5.1  HGB 12.7*   < > 12.5* 12.9* 12.9*  HCT 38.5*   < > 38.0* 37.0* 37.7*  MCV 98.0  --  97.7 93.4 92.4  PLT 143*  --  109* 125* 111*   < > = values in this interval not displayed.    Scheduled Meds:  aspirin EC  81 mg Oral q morning   atropine  1 drop Right Eye QHS   brimonidine  1 drop Left Eye TID   Chlorhexidine Gluconate Cloth  6 each Topical Q0600   dorzolamide-timolol  1 drop Left Eye BID    insulin aspart  0-6 Units Subcutaneous TID WC   latanoprost  1 drop Left Eye QHS   mouth rinse  15 mL Mouth Rinse BID   pantoprazole  40 mg Oral Daily   pilocarpine  1 drop Left Eye TID   prednisoLONE acetate  1 drop Right Eye QHS   sodium bicarbonate  1,300 mg Oral BID   triamcinolone ointment  1 application Topical Daily   Continuous Infusions:   prismasol BGK 4/2.5 400 mL/hr at 04/26/21 0600    prismasol BGK 4/2.5 400 mL/hr at 04/26/21 0604   prismasol BGK 4/2.5 1,500 mL/hr at 04/26/21 0749   PRN Meds:.acetaminophen, heparin, heparin, metoprolol tartrate  ABG    Component Value Date/Time   HCO3 11.7 (L) 04/23/2021 1329   TCO2 12 (L) 04/23/2021 1329   ACIDBASEDEF 14.0 (H) 04/23/2021 1329   O2SAT 92 04/23/2021 1329    A/P  Dialysis dependent anuric AKI, unclear etiology, nl GFR as of 04/2020. Neg renal US at admission.   No UA but gross hematura on foley placement unclear if traumatic.  Failed diuresis now on CRRT primarily for volume control but no pressors and can likely transition to iHD in near future.  Serologies pending.  Broad differential.  Hyperkalemia: mild, should correct with CRRT, on all 4K, CTM Metabolic acidosis, on CRRT + NaHCO3, trend for now and stop NaHCO3 with HCO3 > 20 Anemia, mild CTM Blindness DM2 Massive anasarca AFib per primary  Pearson Grippe, MD [facility]

## 2021-04-26 NOTE — Progress Notes (Signed)
Hypoglycemic Event  CBG: 65  Treatment: 4 oz juice/soda  Symptoms: None  Follow-up CBG: Time: 0822 CBG Result:67, repeated 4 oz juice and notified MD  Possible Reasons for Event: Inadequate meal intake  Comments/MD notified: MD aware    Guadalupe Dawn

## 2021-04-26 NOTE — Progress Notes (Signed)
Hypoglycemic Event  CBG: 67  Treatment: D50 25 mL (12.5 gm)  Symptoms: None  Follow-up CBG: Time:12:34 AM  CBG Result:96  Possible Reasons for Event: Inadequate meal intake      Austin Wheeler

## 2021-04-26 NOTE — Progress Notes (Addendum)
This chaplain responded to PMT consult for spiritual care and prayer. The chaplain reviewed the Pt. chart and was updated by the Pt. RN-Ashley. The chaplain understands the Pt. is sleeping and family is on the way.  This chaplain will attempt F/U spiritual care visit today.  **[id number] The chaplain is present at the Pt. bedside. The Pt. wife and son arrives a few minutes into the visit. The chaplain introduced herself as part of the PMT. The Pt. declined prayer at this time. The Pt. and family agreed on intercessory prayer.  This chaplain is available for F/U spiritual care as needed.  Chaplain Sallyanne Kuster [phone]

## 2021-04-26 NOTE — Progress Notes (Signed)
eLink Physician-Brief Progress Note Patient Name: ZAION HREHA DOB: 05-Nov-1940 MRN: [id number]   Date of Service  04/26/2021  HPI/Events of Note  Hypothermia - Temp = 96.1 F.   eICU Interventions  Plan: Bair Hugger PRN.     Intervention Category Major Interventions: Other:  Lysle Dingwall 04/26/2021, 12:33 AM

## 2021-04-26 NOTE — Progress Notes (Signed)
Initial Nutrition Assessment  DOCUMENTATION CODES:   Severe malnutrition in context of acute illness/injury  INTERVENTION:   Ensure Enlive po TID, each supplement provides 350 kcal and 20 grams of protein.   Vital Cuisine Shake BID, each supplement provides 520 kcal and 22 grams of protein.  B-complex with vitamin C once daily while on CRRT.  NUTRITION DIAGNOSIS:   Severe Malnutrition related to acute illness (AKI) as evidenced by moderate fat depletion, moderate muscle depletion, severe muscle depletion.  GOAL:   Patient will meet greater than or equal to 90% of their needs  MONITOR:   PO intake, Supplement acceptance, Labs, I & O's  REASON FOR ASSESSMENT:   Rounds    ASSESSMENT:   81 yo male admitted with AKI. PMH includes HTN, DM, BPH, legally blind both eyes.  Per discussion with RN, patient is eating poorly d/t poor appetite. Spoke with patient and his family at bedside. Patient has been eating poorly for the past 1-2 weeks d/t altered taste (suspect r/t uremia). Usual weight is ~290 lbs. He typically eats 3 meals per day, but recently, has only been eating ~1/2 of his usual meals. He tried a vanilla Ensure supplement yesterday, but did not like the aftertaste. He agreed to try a different flavor to see if he likes it better. Will also try Vital Cuisine shakes with meals. Patient reports gas around his stomach that is also affecting appetite.   CRRT was initiated on 2/26.   Lowest weight since admission 122.2 kg (269 lbs), down from usual weight of 290 lbs. Current weight is elevated d/t edema. Unable to accurately determine dry weight loss at this time.   Labs reviewed. Na 134, K 5.5, BUN 100, creat 13.05, phos 5.1, mag 2.7 CBG: 67-91-79  Medications reviewed and include Novolog, Protonix, sodium bicarb tablet.  NUTRITION - FOCUSED PHYSICAL EXAM:  Flowsheet Row Most Recent Value  Orbital Region Moderate depletion  Upper Arm Region Moderate depletion  Thoracic  and Lumbar Region Moderate depletion  Buccal Region Moderate depletion  Temple Region Moderate depletion  Clavicle Bone Region Moderate depletion  Clavicle and Acromion Bone Region Severe depletion  Scapular Bone Region Severe depletion  Dorsal Hand Moderate depletion  Patellar Region Mild depletion  Anterior Thigh Region Mild depletion  Posterior Calf Region No depletion  Edema (RD Assessment) Severe  Hair Reviewed  Eyes Unable to assess  Mouth Reviewed  Skin Reviewed  Nails Reviewed       Diet Order:   Diet Order             Diet full liquid Room service appropriate? Yes; Fluid consistency: Thin  Diet effective now                   EDUCATION NEEDS:   Not appropriate for education at this time  Skin:  Skin Assessment: Reviewed RN Assessment (skin tear R elbow)  Last BM:  2/24  Height:   Ht Readings from Last 1 Encounters:  04/23/21 6\' 6"  (1.981 m)    Weight:   Wt Readings from Last 1 Encounters:  04/26/21 126.8 kg    Ideal Body Weight:  97.3 kg  BMI:  Body mass index is 32.3 kg/m.  Estimated Nutritional Needs:   Kcal:  2800-3000  Protein:  145-195 gm  Fluid:  1 L + UOP    Lucas Mallow RD, LDN, CNSC Please refer to Amion for contact information.

## 2021-04-27 DIAGNOSIS — E43 Unspecified severe protein-calorie malnutrition: Secondary | ICD-10-CM

## 2021-04-27 DIAGNOSIS — Z515 Encounter for palliative care: Secondary | ICD-10-CM | POA: Diagnosis not present

## 2021-04-27 DIAGNOSIS — E875 Hyperkalemia: Secondary | ICD-10-CM | POA: Diagnosis not present

## 2021-04-27 DIAGNOSIS — K5900 Constipation, unspecified: Secondary | ICD-10-CM

## 2021-04-27 DIAGNOSIS — E162 Hypoglycemia, unspecified: Secondary | ICD-10-CM | POA: Diagnosis not present

## 2021-04-27 DIAGNOSIS — N179 Acute kidney failure, unspecified: Secondary | ICD-10-CM | POA: Diagnosis not present

## 2021-04-27 LAB — GLUCOSE, CAPILLARY
Glucose-Capillary: 100 mg/dL — ABNORMAL HIGH (ref 70–99)
Glucose-Capillary: 105 mg/dL — ABNORMAL HIGH (ref 70–99)
Glucose-Capillary: 57 mg/dL — ABNORMAL LOW (ref 70–99)
Glucose-Capillary: 71 mg/dL (ref 70–99)
Glucose-Capillary: 96 mg/dL (ref 70–99)
Glucose-Capillary: 99 mg/dL (ref 70–99)

## 2021-04-27 LAB — HEPARIN LEVEL (UNFRACTIONATED): Heparin Unfractionated: 0.55 IU/mL (ref 0.30–0.70)

## 2021-04-27 LAB — CBC
HCT: 37.6 % — ABNORMAL LOW (ref 39.0–52.0)
Hemoglobin: 13.3 g/dL (ref 13.0–17.0)
MCH: 32.3 pg (ref 26.0–34.0)
MCHC: 35.4 g/dL (ref 30.0–36.0)
MCV: 91.3 fL (ref 80.0–100.0)
Platelets: 110 10*3/uL — ABNORMAL LOW (ref 150–400)
RBC: 4.12 MIL/uL — ABNORMAL LOW (ref 4.22–5.81)
RDW: 14.8 % (ref 11.5–15.5)
WBC: 5.7 10*3/uL (ref 4.0–10.5)
nRBC: 0 % (ref 0.0–0.2)

## 2021-04-27 LAB — KAPPA/LAMBDA LIGHT CHAINS
Kappa free light chain: 245.7 mg/L — ABNORMAL HIGH (ref 3.3–19.4)
Kappa, lambda light chain ratio: 2.51 — ABNORMAL HIGH (ref 0.26–1.65)
Lambda free light chains: 97.8 mg/L — ABNORMAL HIGH (ref 5.7–26.3)

## 2021-04-27 LAB — ANCA PROFILE
Anti-MPO Antibodies: <0.2 units (ref 0.0–0.9)
Anti-PR3 Antibodies: <0.2 units (ref 0.0–0.9)
Atypical P-ANCA titer: <1:20 {titer}
C-ANCA: <1:20 {titer}
P-ANCA: <1:20 {titer}

## 2021-04-27 LAB — RENAL FUNCTION PANEL
Albumin: 3.2 g/dL — ABNORMAL LOW (ref 3.5–5.0)
Albumin: 3 g/dL — ABNORMAL LOW (ref 3.5–5.0)
Anion gap: 13 (ref 5–15)
Anion gap: 11 (ref 5–15)
BUN: 64 mg/dL — ABNORMAL HIGH (ref 8–23)
BUN: 51 mg/dL — ABNORMAL HIGH (ref 8–23)
CO2: 20 mmol/L — ABNORMAL LOW (ref 22–32)
CO2: 23 mmol/L (ref 22–32)
Calcium: 8.9 mg/dL (ref 8.9–10.3)
Calcium: 8.4 mg/dL — ABNORMAL LOW (ref 8.9–10.3)
Chloride: 102 mmol/L (ref 98–111)
Chloride: 101 mmol/L (ref 98–111)
Creatinine, Ser: 9.2 mg/dL — ABNORMAL HIGH (ref 0.61–1.24)
Creatinine, Ser: 7.24 mg/dL — ABNORMAL HIGH (ref 0.61–1.24)
GFR, Estimated: 5 mL/min — ABNORMAL LOW (ref >60)
GFR, Estimated: 7 mL/min — ABNORMAL LOW (ref >60)
Glucose, Bld: 68 mg/dL — ABNORMAL LOW (ref 70–99)
Glucose, Bld: 100 mg/dL — ABNORMAL HIGH (ref 70–99)
Phosphorus: 3.4 mg/dL (ref 2.5–4.6)
Phosphorus: 2.5 mg/dL (ref 2.5–4.6)
Potassium: 5 mmol/L (ref 3.5–5.1)
Potassium: 4.8 mmol/L (ref 3.5–5.1)
Sodium: 135 mmol/L (ref 135–145)
Sodium: 135 mmol/L (ref 135–145)

## 2021-04-27 LAB — MAGNESIUM: Magnesium: 2.7 mg/dL — ABNORMAL HIGH (ref 1.7–2.4)

## 2021-04-27 LAB — CK: Total CK: 70 U/L (ref 49–397)

## 2021-04-27 LAB — PROTIME-INR
INR: 1.5 — ABNORMAL HIGH (ref 0.8–1.2)
Prothrombin Time: 18.3 seconds — ABNORMAL HIGH (ref 11.4–15.2)

## 2021-04-27 MED ORDER — DEXTROSE 10 % IV SOLN: INTRAVENOUS | Status: DC

## 2021-04-27 MED ORDER — SENNOSIDES-DOCUSATE SODIUM 8.6-50 MG PO TABS
1.0000 | ORAL_TABLET | Freq: Once | ORAL | Status: AC
  Administered 2021-04-27: 1 via ORAL
  Filled 2021-04-27: qty 1

## 2021-04-27 MED ORDER — POLYETHYLENE GLYCOL 3350 17 G PO PACK
17.0000 g | PACK | Freq: Every day | ORAL | Status: DC
  Administered 2021-04-27 – 2021-05-05 (×4): 17 g via ORAL
  Filled 2021-04-27 (×6): qty 1

## 2021-04-27 MED ORDER — HEPARIN (PORCINE) 25000 UT/250ML-% IV SOLN
1500.0000 [IU]/h | INTRAVENOUS | Status: DC
Start: 2021-04-27 — End: 2021-04-28
  Administered 2021-04-27 – 2021-04-28 (×2): 1600 [IU]/h via INTRAVENOUS
  Filled 2021-04-27 (×2): qty 250

## 2021-04-27 MED ORDER — BISACODYL 10 MG RE SUPP
10.0000 mg | Freq: Every day | RECTAL | Status: DC | PRN
  Administered 2021-04-28: 10 mg via RECTAL
  Filled 2021-04-27: qty 1

## 2021-04-27 NOTE — Procedures (Signed)
Admit: 04/23/2021 LOS: 4  50M AKI (unclear cause) with remote hx/o hematuria/proteinuria and volume overload started CRRT 04/25/21  Current CRRT Prescription: Start Date: 04/25/21 Catheter: R IJ Temp Cath Placed 2/26 CCM BFR: 240-350 Pre Blood Pump: 400 4K DFR: 1500 4K Replacement Rate: 400 4K Goal UF: 220mL/h/net neg Anticoagulation: None Clotting: infrequent   S: Tol CRRT overnight, pulling 217mL/h net neg -4L yesterday, -5L from admit; weight down 8kg from admit No UA, appears to be anuric Renal US was w/o HN at time of presentation Remains very edematous   Latest Reference Range & Units 04/23/21 20:21  C3 Complement 82 - 167 mg/dL 84  Compl, Total (CH50) >41 U/mL 60  Complement C4, Body Fluid 12 - 38 mg/dL 20    Latest Reference Range & Units 04/23/21 20:21  ASO 0.0 - 200.0 IU/mL <20.0  ds DNA Ab 0 - 9 IU/mL <1  GBM Ab 0.0 - 0.9 units <0.2    Latest Reference Range & Units 04/26/21 10:59  Hepatitis B Surface Ag NON REACTIVE  NON REACTIVE  HCV Ab NON REACTIVE  NON REACTIVE   Pending: ANA, ANCA, SPEP/SFLC, HIV  2/24 Renal US: no obstruction, inc renal echogenicity noted  O: 02/27 0701 - 02/28 0700 In: 280 [P.O.:240] Out: 4332 [Urine:40]  Filed Weights   04/23/21 1900 04/25/21 1137 04/26/21 0304  Weight: 122.2 kg 128.6 kg 126.8 kg    Recent Labs  Lab 04/26/21 0305 04/26/21 1554 04/27/21 0103  NA 134* 135 135  K 5.5* 5.3* 5.0  CL 102 99 102  CO2 17* 20* 20*  GLUCOSE 74 83 68*  BUN 100* 77* 64*  CREATININE 13.05* 10.65* 9.20*  CALCIUM 9.0 8.9 8.9  PHOS 5.1* 4.2 3.4    Recent Labs  Lab 04/23/21 1137 04/23/21 1329 04/25/21 0231 04/26/21 0305 04/27/21 0103  WBC 6.6   < > 7.6 6.4 5.7  NEUTROABS 5.3  --  6.6 5.1  --   HGB 12.7*   < > 12.9* 12.9* 13.3  HCT 38.5*   < > 37.0* 37.7* 37.6*  MCV 98.0   < > 93.4 92.4 91.3  PLT 143*   < > 125* 111* 110*   < > = values in this interval not displayed.     Scheduled Meds:  aspirin EC  81 mg Oral q  morning   atropine  1 drop Right Eye QHS   brimonidine  1 drop Left Eye TID   Chlorhexidine Gluconate Cloth  6 each Topical Q0600   dorzolamide-timolol  1 drop Left Eye BID   feeding supplement  237 mL Oral TID BM   insulin aspart  0-6 Units Subcutaneous TID WC   latanoprost  1 drop Left Eye QHS   mouth rinse  15 mL Mouth Rinse BID   pantoprazole  40 mg Oral Daily   pilocarpine  1 drop Left Eye TID   prednisoLONE acetate  1 drop Right Eye QHS   sodium bicarbonate  1,300 mg Oral BID   triamcinolone ointment  1 application Topical Daily   Continuous Infusions:   prismasol BGK 4/2.5 400 mL/hr at 04/27/21 0731    prismasol BGK 4/2.5 400 mL/hr at 04/27/21 0727   prismasol BGK 4/2.5 1,500 mL/hr at 04/27/21 0321   PRN Meds:.acetaminophen, heparin, heparin, metoprolol tartrate  ABG    Component Value Date/Time   HCO3 11.7 (L) 04/23/2021 1329   TCO2 12 (L) 04/23/2021 1329   ACIDBASEDEF 14.0 (H) 04/23/2021 1329   O2SAT 92 04/23/2021  76    A/P  Dialysis dependent anuric AKI, unclear etiology, nl GFR as of 04/2020. Neg renal US at admission.   No UA but gross hematura on foley placement unclear if traumatic.  Failed diuresis now on CRRT primarily for volume control but no pressors and can likely transition to iHD in near future but for now cont aggressive UF given profound hypervolemia.  Serologies either neg or pending.  WIll probalby req biopsy when stabilized.  Check CK Hyperkalemia: Improved with CRRT, on all 4K, CTM Metabolic acidosis, on CRRT + NaHCO3, trend for now and stop NaHCO3 with HCO3 > 20 Anemia, mild CTM Blindness due ot macular degeneration DM2, not a longterm issue Massive anasarca AFib per primary  Austin Grippe, MD [facility]

## 2021-04-27 NOTE — Progress Notes (Signed)
Patient ID: Austin Wheeler, male   DOB: Dec 08, 1940, 81 y.o.   MRN: [id number]    Progress Note from the Palliative Medicine Team at [facility]   Patient Name: Austin Wheeler        Date: 04/27/2021 DOB: 1940-08-27  Age: 81 y.o. 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  Nephrology consulted  Dialysis dependant anuric AKI with unclear etiology.  Currently with CRRT.    This NP visited patient at the bedside as a follow up for palliative medicine needs and emotional support, wife and son/Eric at the bedside.  I was present with Dr. Joelyn Oms , he discussed with patient and family the unclear etiology of his acute kidney injury, plan for transition to intermittent hemodialysis and possible kidney biopsy.  Plan of care -DNR/DNI -Symptom management      -Constipation          -MiraLAX packet 17 g oral daily          -Dulcolax suppository 10 mg rectal daily as needed -Patient and family are open to all offered and available medical interventions to prolong life.       All are hopeful for improvement  Discussed with patient the importance of continued conversation with his family and their  medical providers regarding overall plan of care and treatment options,  ensuring decisions are within the context of the patients values and GOCs.  Questions and concerns addressed   Discussed with Dr Joelyn Oms  PMT will continue to support holistically.  Wadie Lessen NP  Palliative Medicine Team Team Phone # [phone] Pager [phone]

## 2021-04-27 NOTE — Progress Notes (Addendum)
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   Patient started to have increasing bilateral lower extremities swelling about 3 months ago and was started of po Lasix 40 mg daily, swelling appears to be improving, patient however had episodes of feeling lightheadedness and "too much urination" and decided to cut down Lasix from 40 mg daily to 20 mg daily about 1 month ago. Condition remained stable since until about 1 week ago, patient started to notice swelling in his abdomen and legs, and decreased urine output.  Finally, for the past 3 days, there has been barely any urine came out. He said he feeling "bladder fullness" when having BM, but no urine came out. No abdominal pain, no back pain, no shortness of breath, no fever or chills. No Hx of CHF or stroke.    Pertinent  Medical History  HTN, well-controlled DM, BPH, legally blind in both eyes, prostate cancer with active surveillance  Significant Hospital Events: Including procedures, antibiotic start and stop dates in addition to other pertinent events   2/24: Admitted to [facility] for acute on chronic renal failure, new onset a. Fib. Nephrology consulted; high dose Lasix trial initiated, large volume paracentesis of 8.3 L 2/26: PCCM consulted. Transfer to ICU for CRRT  2/27: tolerated CRRT  Interim History / Subjective:  O/N: hypoglycemia with CBG 67, serosanguinous discharge from around foley catheter. Pink output when foley flushed.  Patient assessed at bedside this AM. His main concern is why his taste is so abnormal.   Objective   Blood pressure 107/74, pulse (!)  104, temperature 98.8 F (37.1 C), temperature source Axillary, resp. rate 11, height 6' 6" (1.981 m), weight 126.8 kg, SpO2 100 %.        Intake/Output Summary (Last 24 hours) at 04/27/2021 0617 Last data filed at 04/27/2021 0600 Gross per 24 hour  Intake 280 ml  Output 4240 ml  Net -3960 ml   Filed Weights   04/23/21 1900 04/25/21 1137 04/26/21 0304  Weight: 122.2 kg 128.6 kg 126.8 kg    Examination: General: elderly M laying in bed HENT: Central line in place in right IJ Lungs: CTAB, normal pulmonary effort on room air Cardiovascular: irregularly irregular, no m/r/g Abdomen: non tender, less distension than prior day Extremities: 3+ pitting edema to thighs bilaterally Neuro: Alert and oriented x4 GU: deferred  HR up to 105 Platelets 110 Mg 2.7 K 5.3-> 5 Bicarb 20 BUN 77-> 64 Creatinine 10.65-> 9.2, GFR 5 Albumin 3.2 AG 13  Resolved Hospital Problem list   Uremic encephalopathy Hyperkalemia  Assessment & Plan:  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  -continue home eye drop medication  Respiratory: stable O2 saturations on room air Infectious: n/a Cardiac: New onset atrial fibrillation Afib on tele. Rate control with metoprolol.  CHADVASC score of 4 (age, HTN, DM), initially holding anticoagulation in setting of gross hematuria. Overnight patient had pink output when foley flushed with NS, no episodes of gross hematuria.  -start heparin  Hematology: Thrombocytopenia Hgb 12.9-> 13.3 likely from removing fluids with  dialysis. Platelets 110, INR 1.5.  -trend CBC -trend INR  Metabolic: Acute Kidney Injury with Anuria Anion gap metabolic acidosis NAGMA Patient with acute kidney injury of unclear origin. He tolerated CRRT 2/27 with -5L . K 5.3-> 5, Bicarb 20, BUN 77-> 64, Creatinine 10.65-> 9.2, GFR 5, AG 13. Differential includes ATN versus nephrotic syndrome versus acute GN given  hematuria.  -Nephrology following, planning for kidney biopsy later this week -sodium bicarb tablets -serologies for acute GN thus far negative, hep b/c negative, ANA, unable thus far to obtain urine sample for multiple myeloma work up.  Hypoglycemia BG 67 overnight, D50 given. Patient states that he ate a little more yesterday with change in diet.  Alimentary: Patient has decreased PO intake due to food not tasting good. He had hypoglycemic event overnight with CBG at 67. Albumin 3.2  -full liquid diet and advance as tolerated  GU:  History of prostate cancer, active surveillance with urology.  Hematuria Patient is anuric with light pink output when foley catheter flushed. Hgb stable.  Best Practice (right click and "Reselect all SmartList Selections" daily)   Diet/type: full liquids  DVT prophylaxis: Heparin GI prophylaxis: PPI Lines: Central line Foley:  Yes, and it is still needed Code Status:  DNR Last date of multidisciplinary goals of care discussion [updated wife and son at bedside this [place]. Zalia Hautala, D.O.  Internal Medicine Resident, PGY-1 [facility]  Pager: [phone] 6:17 AM, 04/27/2021

## 2021-04-27 NOTE — Progress Notes (Signed)
ANTICOAGULATION CONSULT NOTE - Initial Consult  Pharmacy Consult for Heparin Indication: atrial fibrillation  Allergies  Allergen Reactions   Guaifenesin Swelling and Rash    Hand and feet swelling from Robitussin   Influenza Vaccines Other (See Comments)    Caused flu-like illness   Lexapro [Escitalopram] Nausea And Vomiting    Reported by [facility] - pt does not recall    Patient Measurements: Height: 6\' 6"  (198.1 cm) Weight: 126.8 kg (279 lb 8.7 oz) IBW/kg (Calculated) : 91.4 Heparin Dosing Weight: 118kg  Vital Signs: Temp: 97.6 F (36.4 C) (02/28 1516) Temp Source: Oral (02/28 1516) BP: 113/83 (02/28 2000) Pulse Rate: 93 (02/28 2000)  Labs: Recent Labs    04/25/21 0231 04/25/21 1550 04/26/21 0305 04/26/21 1554 04/27/21 0103 04/27/21 1408 04/27/21 1516 04/27/21 1911  HGB 12.9*  --  12.9*  --  13.3  --   --   --   HCT 37.0*  --  37.7*  --  37.6*  --   --   --   PLT 125*  --  111*  --  110*  --   --   --   LABPROT  --   --   --   --  18.3*  --   --   --   INR  --   --   --   --  1.5*  --   --   --   HEPARINUNFRC  --   --   --   --   --   --   --  0.55  CREATININE 16.32*   < > 13.05* 10.65* 9.20*  --  7.24*  --   CKTOTAL  --   --   --   --   --  70  --   --    < > = values in this interval not displayed.     Estimated Creatinine Clearance: 12.4 mL/min (A) (by C-G formula based on SCr of 7.24 mg/dL (H)).   Medical History: Past Medical History:  Diagnosis Date   Blind    Diabetes mellitus without complication ([place])    Hypertension     Assessment: 50 yom presenting with SOB, weakness, dysuria. Heparin per pharmacy consult placed for new onset atrial fibrillation. Patient is not on anticoagulation prior to arrival.   Heparin was held from 2/25-2/27 for hematuria, notable improvement now. No signs or symptoms of bleeding per RN. Will resume heparin and monitor closely.  Heparin level this evening is within goal range at 0.55.  No overt bleeding or  complications noted.  Goal of Therapy:  Heparin level 0.3-0.7 units/ml Monitor platelets by anticoagulation protocol: Yes   Plan:  Continue IV heparin at current rate. Repeat heparin level with AM labs. Daily heparin level and CBC.  [place] Crane, Roylene Reason, BCCP Clinical Pharmacist  04/27/2021 8:49 PM   [facility] pharmacy phone numbers are listed on [email or web address]

## 2021-04-27 NOTE — Progress Notes (Signed)
ANTICOAGULATION CONSULT NOTE - Initial Consult  Pharmacy Consult for Heparin Indication: atrial fibrillation  Allergies  Allergen Reactions   Guaifenesin Swelling and Rash    Hand and feet swelling from Robitussin   Influenza Vaccines Other (See Comments)    Caused flu-like illness   Lexapro [Escitalopram] Nausea And Vomiting    Reported by [facility] - pt does not recall    Patient Measurements: Height: 6\' 6"  (198.1 cm) Weight: 126.8 kg (279 lb 8.7 oz) IBW/kg (Calculated) : 91.4 Heparin Dosing Weight: 118kg  Vital Signs: Temp: 98.8 F (37.1 C) (02/28 0300) Temp Source: Axillary (02/28 0300) BP: 98/72 (02/28 0900) Pulse Rate: 90 (02/28 0900)  Labs: Recent Labs    04/25/21 0231 04/25/21 1550 04/26/21 0305 04/26/21 1554 04/27/21 0103  HGB 12.9*  --  12.9*  --  13.3  HCT 37.0*  --  37.7*  --  37.6*  PLT 125*  --  111*  --  110*  LABPROT  --   --   --   --  18.3*  INR  --   --   --   --  1.5*  CREATININE 16.32*   < > 13.05* 10.65* 9.20*   < > = values in this interval not displayed.    Estimated Creatinine Clearance: 9.7 mL/min (A) (by C-G formula based on SCr of 9.2 mg/dL (H)).   Medical History: Past Medical History:  Diagnosis Date   Blind    Diabetes mellitus without complication ([place])    Hypertension     Assessment: 76 yom presenting with SOB, weakness, dysuria. Heparin per pharmacy consult placed for new onset atrial fibrillation. Patient is not on anticoagulation prior to arrival.   Heparin was held from 2/25-2/27 for hematuria, notable improvement now. No signs or symptoms of bleeding per RN. Will resume heparin and monitor closely.  Goal of Therapy:  Heparin level 0.3-0.7 units/ml Monitor platelets by anticoagulation protocol: Yes   Plan:  Start Heparin at 1600 units/hr, no bolus Monitor CBC and signs/symptoms of bleeding Heparin level in 8 hours  Lestine Box, PharmD PGY2 Infectious Diseases Pharmacy Resident   Please check  [email or web address] for unit-specific pharmacy phone numbers

## 2021-04-28 ENCOUNTER — Encounter (HOSPITAL_COMMUNITY): Payer: Self-pay | Admitting: Internal Medicine

## 2021-04-28 DIAGNOSIS — N179 Acute kidney failure, unspecified: Secondary | ICD-10-CM | POA: Diagnosis not present

## 2021-04-28 LAB — RENAL FUNCTION PANEL
Albumin: 2.9 g/dL — ABNORMAL LOW (ref 3.5–5.0)
Albumin: 2.8 g/dL — ABNORMAL LOW (ref 3.5–5.0)
Anion gap: 9 (ref 5–15)
Anion gap: 8 (ref 5–15)
BUN: 41 mg/dL — ABNORMAL HIGH (ref 8–23)
BUN: 30 mg/dL — ABNORMAL HIGH (ref 8–23)
CO2: 23 mmol/L (ref 22–32)
CO2: 26 mmol/L (ref 22–32)
Calcium: 8.4 mg/dL — ABNORMAL LOW (ref 8.9–10.3)
Calcium: 8.1 mg/dL — ABNORMAL LOW (ref 8.9–10.3)
Chloride: 101 mmol/L (ref 98–111)
Chloride: 100 mmol/L (ref 98–111)
Creatinine, Ser: 6 mg/dL — ABNORMAL HIGH (ref 0.61–1.24)
Creatinine, Ser: 4.59 mg/dL — ABNORMAL HIGH (ref 0.61–1.24)
GFR, Estimated: 9 mL/min — ABNORMAL LOW (ref >60)
GFR, Estimated: 12 mL/min — ABNORMAL LOW (ref >60)
Glucose, Bld: 115 mg/dL — ABNORMAL HIGH (ref 70–99)
Glucose, Bld: 155 mg/dL — ABNORMAL HIGH (ref 70–99)
Phosphorus: 2.1 mg/dL — ABNORMAL LOW (ref 2.5–4.6)
Phosphorus: 2.6 mg/dL (ref 2.5–4.6)
Potassium: 4.6 mmol/L (ref 3.5–5.1)
Potassium: 4.6 mmol/L (ref 3.5–5.1)
Sodium: 133 mmol/L — ABNORMAL LOW (ref 135–145)
Sodium: 134 mmol/L — ABNORMAL LOW (ref 135–145)

## 2021-04-28 LAB — GLUCOSE, CAPILLARY
Glucose-Capillary: 114 mg/dL — ABNORMAL HIGH (ref 70–99)
Glucose-Capillary: 123 mg/dL — ABNORMAL HIGH (ref 70–99)
Glucose-Capillary: 150 mg/dL — ABNORMAL HIGH (ref 70–99)
Glucose-Capillary: 139 mg/dL — ABNORMAL HIGH (ref 70–99)
Glucose-Capillary: 125 mg/dL — ABNORMAL HIGH (ref 70–99)

## 2021-04-28 LAB — CBC
HCT: 36.3 % — ABNORMAL LOW (ref 39.0–52.0)
Hemoglobin: 12.9 g/dL — ABNORMAL LOW (ref 13.0–17.0)
MCH: 32.7 pg (ref 26.0–34.0)
MCHC: 35.5 g/dL (ref 30.0–36.0)
MCV: 91.9 fL (ref 80.0–100.0)
Platelets: 106 10*3/uL — ABNORMAL LOW (ref 150–400)
RBC: 3.95 MIL/uL — ABNORMAL LOW (ref 4.22–5.81)
RDW: 14.9 % (ref 11.5–15.5)
WBC: 5 10*3/uL (ref 4.0–10.5)
nRBC: 0 % (ref 0.0–0.2)

## 2021-04-28 LAB — PROTEIN ELECTROPHORESIS, SERUM
A/G Ratio: 1.2 (ref 0.7–1.7)
Albumin ELP: 3.5 g/dL (ref 2.9–4.4)
Alpha-1-Globulin: 0.3 g/dL (ref 0.0–0.4)
Alpha-2-Globulin: 0.6 g/dL (ref 0.4–1.0)
Beta Globulin: 0.7 g/dL (ref 0.7–1.3)
Gamma Globulin: 1.4 g/dL (ref 0.4–1.8)
Globulin, Total: 3 g/dL (ref 2.2–3.9)
Total Protein ELP: 6.5 g/dL (ref 6.0–8.5)

## 2021-04-28 LAB — MAGNESIUM: Magnesium: 2.6 mg/dL — ABNORMAL HIGH (ref 1.7–2.4)

## 2021-04-28 LAB — HEPARIN LEVEL (UNFRACTIONATED): Heparin Unfractionated: 0.67 IU/mL (ref 0.30–0.70)

## 2021-04-28 MED ORDER — OXYCODONE HCL 5 MG PO TABS
5.0000 mg | ORAL_TABLET | Freq: Four times a day (QID) | ORAL | Status: DC | PRN
  Administered 2021-04-30 – 2021-05-13 (×14): 5 mg via ORAL
  Filled 2021-04-28 (×15): qty 1

## 2021-04-28 MED ORDER — TRAMADOL HCL 50 MG PO TABS
50.0000 mg | ORAL_TABLET | Freq: Once | ORAL | Status: AC
  Administered 2021-04-28: 50 mg via ORAL
  Filled 2021-04-28: qty 1

## 2021-04-28 MED ORDER — HYDROMORPHONE HCL 1 MG/ML IJ SOLN
0.5000 mg | INTRAMUSCULAR | Status: AC
  Administered 2021-04-28: 0.5 mg via INTRAVENOUS
  Filled 2021-04-28: qty 0.5

## 2021-04-28 MED ORDER — SODIUM CHLORIDE 0.9 % IV SOLN: INTRAVENOUS | Status: DC | PRN

## 2021-04-28 MED ORDER — HEPARIN (PORCINE) 25000 UT/250ML-% IV SOLN
1350.0000 [IU]/h | INTRAVENOUS | Status: AC
  Administered 2021-04-28: 1350 [IU]/h via INTRAVENOUS

## 2021-04-28 MED ORDER — "THROMBI-PAD 3""X3"" EX PADS"
1.0000 | MEDICATED_PAD | Freq: Once | CUTANEOUS | Status: DC
  Filled 2021-04-28 (×2): qty 1

## 2021-04-28 MED ORDER — SODIUM PHOSPHATES 45 MMOLE/15ML IV SOLN
25.0000 mmol | Freq: Once | INTRAVENOUS | Status: AC
  Administered 2021-04-28: 25 mmol via INTRAVENOUS
  Filled 2021-04-28: qty 8.33

## 2021-04-28 NOTE — Progress Notes (Addendum)
ANTICOAGULATION CONSULT NOTE ? ?Pharmacy Consult for Heparin ?Indication: atrial fibrillation ? ?Allergies  ?Allergen Reactions  ? Guaifenesin Swelling and Rash  ?  Hand and feet swelling from Robitussin  ? Influenza Vaccines Other (See Comments)  ?  Caused flu-like illness  ? Lexapro [Escitalopram] Nausea And Vomiting  ?  Reported by [facility] - pt does not recall  ? ? ?Patient Measurements: ?Height: 6\' 6"  (198.1 cm) ?Weight: 124.9 kg (275 lb 5.7 oz) ?IBW/kg (Calculated) : 91.4 ?Heparin Dosing Weight: 118kg ? ?Vital Signs: ?Temp: 97.4 ?F (36.3 ?C) (03/01 9021) ?Temp Source: Oral (03/01 1155) ?BP: 104/81 (03/01 0700) ?Pulse Rate: 79 (03/01 0645) ? ?Labs: ?Recent Labs  ?  04/26/21 ?0305 04/26/21 ?1554 04/27/21 ?0103 04/27/21 ?1408 04/27/21 ?1516 04/27/21 ?1911 04/28/21 ?0105  ?HGB 12.9*  --  13.3  --   --   --  12.9*  ?HCT 37.7*  --  37.6*  --   --   --  36.3*  ?PLT 111*  --  110*  --   --   --  106*  ?LABPROT  --   --  18.3*  --   --   --   --   ?INR  --   --  1.5*  --   --   --   --   ?HEPARINUNFRC  --   --   --   --   --  0.55 0.67  ?CREATININE 13.05*   < > 9.20*  --  7.24*  --  6.00*  ?CKTOTAL  --   --   --  70  --   --   --   ? < > = values in this interval not displayed.  ? ? ? ?Estimated Creatinine Clearance: 14.8 mL/min (A) (by C-G formula based on SCr of 6 mg/dL (H)). ? ? ?Assessment: ?72 yom presenting with SOB, weakness, dysuria. Pharmacy consulted to dose IV Heparin for new onset atrial fibrillation ? ?Heparin was held from 2/25-2/27 for hematuria, notable improvement and heparin resumed on 2/28.  Heparin level therapeutic and toward the high end of normal.  RN reports hematuria worsened and patient has been bleeding from the IV site and gum, so heparin infusion was turned off. ? ?Goal of Therapy:  ?Heparin level 0.3-0.7 units/ml ?Monitor platelets by anticoagulation protocol: Yes ?  ?Plan:  ?Monitor for bleeding resolution to resume AC ? ?Harshal Sirmon D. Mina Marble, PharmD, BCPS, BCCCP ?04/28/2021, 7:51  AM ? ?====================================== ? ?Addendum: ?Bleeding has improved since holding heparin (bleeding around foley could be from trauma); bleeding from IV site ceased.  Pharmacy to resume IV heparin per CCM. ? ?Resume IV heparin at a lower rate of 1350 units/hr ?New goal 0.3-0.5 units/mL, no bolus ?Check 8 hr heparin level ?Stop IV heparin on 3/2 at 0500 per IR for renal biopsy ?Monitor closely for bleeding/resolution ? ?Suanne Minahan D. Mina Marble, PharmD, BCPS, BCCCP ?04/28/2021, 2:24 PM ? ? ? ? ? ? ?

## 2021-04-28 NOTE — Consult Note (Signed)
Urology Consult   Physician requesting consult: Dr. Shearon Stalls  Reason for consult: Hematuria, urethral catheter traums  History of Present Illness: Austin Wheeler is a 81 y.o. oliguric renal failure who apparently presented to the hospital with a complaint of no urine output.  He was found to have non-obstructive, anuria with a CT scan indicating no hydronephrosis.  A catheter was placed and he subsequently developed hematuria and blood around the meatus.  He was also noted to be in atrial fibrillation and started on IV heparin which has been stopped intermittently due to bleeding around his catheter as well as bleeding from IV sites, etc.   He is scheduled for a renal biopsy tomorrow to help assess the etiology for his renal failure.  According to his wife, he has not had hematuria previously and had not noted hematuria until after his catheter was placed.  He has a history of BPH on doxazosin and dutasteride.     Past Medical History:  Diagnosis Date   Blind    Diabetes mellitus without complication ([place])    Hypertension     Past Surgical History:  Procedure Laterality Date   IR PARACENTESIS  04/23/2021    Medications:  Home meds:  No current facility-administered medications on file prior to encounter.   Current Outpatient Medications on File Prior to Encounter  Medication Sig Dispense Refill   acetaminophen (TYLENOL) 500 MG tablet Take 500 mg by mouth daily as needed for headache (pain).     amLODipine (NORVASC) 10 MG tablet Take 10 mg by mouth every morning.     aspirin EC 81 MG tablet 81 mg every morning.     atropine 1 % ophthalmic solution Place 1 drop into the right eye at bedtime.     brimonidine (ALPHAGAN) 0.2 % ophthalmic solution Place 1 drop into the left eye 3 (three) times daily.     cholecalciferol (VITAMIN D3) 25 MCG (1000 UNIT) tablet Take 1,000 Units by mouth 2 (two) times daily.     dorzolamide-timolol (COSOPT) 22.3-6.8 MG/ML ophthalmic solution Place 1 drop into  the left eye 2 (two) times daily.     doxazosin (CARDURA) 8 MG tablet Take 1 tablet (8 mg total) by mouth daily. (Patient taking differently: Take 8 mg by mouth every morning.) 90 tablet 3   dutasteride (AVODART) 0.5 MG capsule Take 1 capsule (0.5 mg total) by mouth daily. (Patient taking differently: Take 0.5 mg by mouth every Monday, Wednesday, and Friday.) 90 capsule 3   furosemide (LASIX) 40 MG tablet Take 40 mg by mouth every morning.     Garlic 010 MG TABS Take 500 mg by mouth every morning.     latanoprost (XALATAN) 0.005 % ophthalmic solution Place 1 drop into the left eye at bedtime.     metFORMIN (GLUCOPHAGE) 500 MG tablet Take 250 mg by mouth 2 (two) times daily.     metoprolol tartrate (LOPRESSOR) 50 MG tablet Take 50 mg by mouth 2 (two) times daily.     Multiple Vitamin (MULTIVITAMIN WITH MINERALS) TABS tablet Take 1 tablet by mouth in the morning.     Multiple Vitamins-Minerals (PRESERVISION AREDS 2) CAPS Take 1 capsule by mouth 2 (two) times daily.     olmesartan-hydrochlorothiazide (BENICAR HCT) 40-12.5 MG tablet Take 1 tablet by mouth every morning.     OVER THE COUNTER MEDICATION Take 1 capsule by mouth See admin instructions. Go Out (OTC) - take one capsule by mouth on Tuesday and Thursday morning (for gout)  pilocarpine (PILOCAR) 4 % ophthalmic solution Place 1 drop into the left eye 3 (three) times daily.  11   potassium chloride SA (KLOR-CON M) 20 MEQ tablet Take 10 mEq by mouth every morning.     prednisoLONE acetate (PRED FORTE) 1 % ophthalmic suspension Place 1 drop into the right eye at bedtime.     triamcinolone ointment (KENALOG) 0.1 % Apply 1 application topically See admin instructions. Apply topically legs nightly after compression stockings are removed     vitamin C (ASCORBIC ACID) 500 MG tablet Take 500 mg by mouth every Saturday.     vitamin E 180 MG (400 UNITS) capsule Take 400 Units by mouth every morning.       Scheduled Meds:  aspirin EC  81 mg Oral q  morning   atropine  1 drop Right Eye QHS   brimonidine  1 drop Left Eye TID   Chlorhexidine Gluconate Cloth  6 each Topical Q0600   dorzolamide-timolol  1 drop Left Eye BID   feeding supplement  237 mL Oral TID BM   insulin aspart  0-6 Units Subcutaneous TID WC   latanoprost  1 drop Left Eye QHS   mouth rinse  15 mL Mouth Rinse BID   pantoprazole  40 mg Oral Daily   pilocarpine  1 drop Left Eye TID   polyethylene glycol  17 g Oral Daily   prednisoLONE acetate  1 drop Right Eye QHS   triamcinolone ointment  1 application Topical Daily   Continuous Infusions:   prismasol BGK 4/2.5 400 mL/hr at 04/28/21 0937    prismasol BGK 4/2.5 400 mL/hr at 04/28/21 0940   sodium chloride Stopped (04/28/21 1032)   dextrose Stopped (04/28/21 1458)   heparin 1,350 Units/hr (04/28/21 1600)   prismasol BGK 4/2.5 1,500 mL/hr at 04/28/21 1355   sodium phosphate  Dextrose 5% IVPB 43 mL/hr at 04/28/21 1600   PRN Meds:.sodium chloride, acetaminophen, bisacodyl, heparin, heparin, metoprolol tartrate, oxyCODONE  Allergies:  Allergies  Allergen Reactions   Guaifenesin Swelling and Rash    Hand and feet swelling from Robitussin   Influenza Vaccines Other (See Comments)    Caused flu-like illness   Lexapro [Escitalopram] Nausea And Vomiting    Reported by [facility] - pt does not recall    History reviewed. No pertinent family history.  Social History:  reports that he has never smoked. He has never used smokeless tobacco. He reports that he does not currently use alcohol. He reports that he does not use drugs.  ROS: A complete review of systems was performed.  All systems are negative except for pertinent findings as noted.  Physical Exam:  Vital signs in last 24 hours: Temp:  [97.2 F (36.2 C)-97.8 F (36.6 C)] 97.2 F (36.2 C) (03/01 1113) Pulse Rate:  [75-103] 82 (03/01 1600) Resp:  [8-17] 11 (03/01 1600) BP: (86-122)/(63-96) 95/70 (03/01 1600) SpO2:  [79 %-100 %] 100 % (03/01  1600) Weight:  [124.9 kg] 124.9 kg (03/01 0500) Constitutional:  Alert and oriented, No acute distress Cardiovascular: No JVD Respiratory: Normal respiratory effort GI: Abdomen is nondistended Genitourinary: Normal male phallus, testes are descended bilaterally and non-tender and without masses, scrotum is normal in appearance without lesions or masses, perineum is normal on inspection. Indwelling catheter with minimal urine in tubing but it is clear grossly.  Minimal blood at urethral meatus currently. Lymphatic: No lymphadenopathy Psychiatric: Normal mood and affect  Laboratory Data:  Recent Labs    04/26/21 0305 04/27/21 0103  04/28/21 0105  WBC 6.4 5.7 5.0  HGB 12.9* 13.3 12.9*  HCT 37.7* 37.6* 36.3*  PLT 111* 110* 106*    Recent Labs    04/26/21 0305 04/26/21 1554 04/27/21 0103 04/27/21 1516 04/28/21 0105  NA 134* 135 135 135 133*  K 5.5* 5.3* 5.0 4.8 4.6  CL 102 99 102 101 101  GLUCOSE 74 83 68* 100* 115*  BUN 100* 77* 64* 51* 41*  CALCIUM 9.0 8.9 8.9 8.4* 8.4*  CREATININE 13.05* 10.65* 9.20* 7.24* 6.00*     Results for orders placed or performed during the hospital encounter of 04/23/21 (from the past 24 hour(s))  Glucose, capillary     Status: None   Collection Time: 04/27/21  4:31 PM  Result Value Ref Range   Glucose-Capillary 99 70 - 99 mg/dL  Heparin level (unfractionated)     Status: None   Collection Time: 04/27/21  7:11 PM  Result Value Ref Range   Heparin Unfractionated 0.55 0.30 - 0.70 IU/mL  Glucose, capillary     Status: Abnormal   Collection Time: 04/27/21  7:48 PM  Result Value Ref Range   Glucose-Capillary 105 (H) 70 - 99 mg/dL  Magnesium     Status: Abnormal   Collection Time: 04/28/21  1:05 AM  Result Value Ref Range   Magnesium 2.6 (H) 1.7 - 2.4 mg/dL  Heparin level (unfractionated)     Status: None   Collection Time: 04/28/21  1:05 AM  Result Value Ref Range   Heparin Unfractionated 0.67 0.30 - 0.70 IU/mL  CBC     Status: Abnormal    Collection Time: 04/28/21  1:05 AM  Result Value Ref Range   WBC 5.0 4.0 - 10.5 K/uL   RBC 3.95 (L) 4.22 - 5.81 MIL/uL   Hemoglobin 12.9 (L) 13.0 - 17.0 g/dL   HCT 36.3 (L) 39.0 - 52.0 %   MCV 91.9 80.0 - 100.0 fL   MCH 32.7 26.0 - 34.0 pg   MCHC 35.5 30.0 - 36.0 g/dL   RDW 14.9 11.5 - 15.5 %   Platelets 106 (L) 150 - 400 K/uL   nRBC 0.0 0.0 - 0.2 %  Renal function panel     Status: Abnormal   Collection Time: 04/28/21  1:05 AM  Result Value Ref Range   Sodium 133 (L) 135 - 145 mmol/L   Potassium 4.6 3.5 - 5.1 mmol/L   Chloride 101 98 - 111 mmol/L   CO2 23 22 - 32 mmol/L   Glucose, Bld 115 (H) 70 - 99 mg/dL   BUN 41 (H) 8 - 23 mg/dL   Creatinine, Ser 6.00 (H) 0.61 - 1.24 mg/dL   Calcium 8.4 (L) 8.9 - 10.3 mg/dL   Phosphorus 2.1 (L) 2.5 - 4.6 mg/dL   Albumin 2.9 (L) 3.5 - 5.0 g/dL   GFR, Estimated 9 (L) >60 mL/min   Anion gap 9 5 - 15  Glucose, capillary     Status: Abnormal   Collection Time: 04/28/21  3:43 AM  Result Value Ref Range   Glucose-Capillary 114 (H) 70 - 99 mg/dL  Glucose, capillary     Status: Abnormal   Collection Time: 04/28/21  7:16 AM  Result Value Ref Range   Glucose-Capillary 123 (H) 70 - 99 mg/dL  Glucose, capillary     Status: Abnormal   Collection Time: 04/28/21 11:12 AM  Result Value Ref Range   Glucose-Capillary 150 (H) 70 - 99 mg/dL  Glucose, capillary     Status: Abnormal  Collection Time: 04/28/21  3:40 PM  Result Value Ref Range   Glucose-Capillary 139 (H) 70 - 99 mg/dL   Recent Results (from the past 240 hour(s))  Resp Panel by RT-PCR (Flu A&B, Covid) Nasopharyngeal Swab     Status: None   Collection Time: 04/23/21 12:23 PM   Specimen: Nasopharyngeal Swab; Nasopharyngeal(NP) swabs in vial transport medium  Result Value Ref Range Status   SARS Coronavirus 2 by RT PCR NEGATIVE NEGATIVE Final    Comment: (NOTE) SARS-CoV-2 target nucleic acids are NOT DETECTED.  The SARS-CoV-2 RNA is generally detectable in upper respiratory specimens  during the acute phase of infection. The lowest concentration of SARS-CoV-2 viral copies this assay can detect is 138 copies/mL. A negative result does not preclude SARS-Cov-2 infection and should not be used as the sole basis for treatment or other patient management decisions. A negative result may occur with  improper specimen collection/handling, submission of specimen other than nasopharyngeal swab, presence of viral mutation(s) within the areas targeted by this assay, and inadequate number of viral copies(<138 copies/mL). A negative result must be combined with clinical observations, patient history, and epidemiological information. The expected result is Negative.  Fact Sheet for Patients:  EntrepreneurPulse.com.au  Fact Sheet for Healthcare Providers:  IncredibleEmployment.be  This test is no t yet approved or cleared by the [place] FDA and  has been authorized for detection and/or diagnosis of SARS-CoV-2 by FDA under an Emergency Use Authorization (EUA). This EUA will remain  in effect (meaning this test can be used) for the duration of the COVID-19 declaration under Section 564(b)(1) of the Act, 21 U.S.C.section 360bbb-3(b)(1), unless the authorization is terminated  or revoked sooner.       Influenza A by PCR NEGATIVE NEGATIVE Final   Influenza B by PCR NEGATIVE NEGATIVE Final    Comment: (NOTE) The Xpert Xpress SARS-CoV-2/FLU/RSV plus assay is intended as an aid in the diagnosis of influenza from Nasopharyngeal swab specimens and should not be used as a sole basis for treatment. Nasal washings and aspirates are unacceptable for Xpert Xpress SARS-CoV-2/FLU/RSV testing.  Fact Sheet for Patients: EntrepreneurPulse.com.au  Fact Sheet for Healthcare Providers: IncredibleEmployment.be  This test is not yet approved or cleared by the [place] FDA and has been authorized for detection  and/or diagnosis of SARS-CoV-2 by FDA under an Emergency Use Authorization (EUA). This EUA will remain in effect (meaning this test can be used) for the duration of the COVID-19 declaration under Section 564(b)(1) of the Act, 21 U.S.C. section 360bbb-3(b)(1), unless the authorization is terminated or revoked.  Performed at [facility], [place] [address] [place], [place] [address]   MRSA Next Gen by PCR, Nasal     Status: None   Collection Time: 04/25/21 11:41 AM   Specimen: Nasal Mucosa; Nasal Swab  Result Value Ref Range Status   MRSA by PCR Next Gen NOT DETECTED NOT DETECTED Final    Comment: (NOTE) The GeneXpert MRSA Assay (FDA approved for NASAL specimens only), is one component of a comprehensive MRSA colonization surveillance program. It is not intended to diagnose MRSA infection nor to guide or monitor treatment for MRSA infections. Test performance is not FDA approved in patients less than 54 years old. Performed at [facility], [place] [address], [place], [place] [address]     Renal Function: Recent Labs    04/25/21 0231 04/25/21 1550 04/26/21 0305 04/26/21 1554 04/27/21 0103 04/27/21 1516 04/28/21 0105  CREATININE 16.32* 16.29* 13.05* 10.65* 9.20*  7.24* 6.00*   Estimated Creatinine Clearance: 14.8 mL/min (A) (by C-G formula based on SCr of 6 mg/dL (H)).  Radiologic Imaging: No results found.  I independently reviewed the above imaging studies.  Impression/Recommendation 1) Hematuria: This is likely due to combination of catheter trauma, BPH, and anticoagulation.  Ok to continue IV heparin for now.  I instructed nursing staff how to manage bleeding around meatus if it occurs again.  Please call if further questions or concerns in the meantime.  Dutch Gray 04/28/2021, 4:13 PM    Pryor Curia MD  CC: Dr. Shearon Stalls

## 2021-04-28 NOTE — Procedures (Signed)
Admit: 04/23/2021 ?LOS: 5 ? ?18M AKI (unclear cause) with remote hx/o hematuria/proteinuria and volume overload started CRRT 04/25/21 ? ?Current CRRT Prescription: ?Start Date: 04/25/21 ?Catheter: R IJ Temp Cath Placed 2/26 CCM ?BFR: 240-350 ?Pre Blood Pump: 400 4K ?DFR: 1500 4K ?Replacement Rate: 400 4K ?Goal UF: 275mL/h/net neg ?Anticoagulation: None ?Clotting: infrequent  ? ?S: ?Tol CRRT overnight, pulling 248mL/h net neg ?-3.8L yesterday, -9L from admit; weight down 10kg from admit ?K 4.6, P 2.1 ?Anuric ?ANCA negative, SFLC ratio consistent with reduced GFR ?SPEP, HIV, ANA pending ?Edema improving but still present ? ? Latest Reference Range & Units 04/23/21 20:21  ?C3 Complement 82 - 167 mg/dL 84  ?Compl, Total (CH50) >41 U/mL 60  ?Complement C4, Body Fluid 12 - 38 mg/dL 20  ? ? Latest Reference Range & Units 04/23/21 20:21  ?ASO 0.0 - 200.0 IU/mL <20.0  ?ds DNA Ab 0 - 9 IU/mL <1  ?GBM Ab 0.0 - 0.9 units <0.2  ? ? Latest Reference Range & Units 04/26/21 10:59  ?Hepatitis B Surface Ag NON REACTIVE  NON REACTIVE  ?HCV Ab NON REACTIVE  NON REACTIVE  ? ? Latest Reference Range & Units 04/26/21 10:59  ?Kappa, lambda light chain ratio 0.26 - 1.65  2.51 (H)  ? ? ? Latest Reference Range & Units 04/26/21 10:59  ?Cytoplasmic (C-ANCA) Neg:<1:20 titer <1:20  ?P-ANCA Neg:<1:20 titer <1:20  ?Atypical P-ANCA titer Neg:<1:20 titer <1:20  ?Anti-MPO Antibodies 0.0 - 0.9 units <0.2  ?Anti-PR3 Antibodies 0.0 - 0.9 units <0.2  ? ?Pending: ANA, HIV, SPEP ? ?2/24 Renal US: no obstruction, inc renal echogenicity noted ? ?O: ?02/28 0701 - 03/01 0700 ?In: 2265 [P.O.:912; I.V.:1333] ?Out: 6139 [Urine:32] ? ?Filed Weights  ? 04/25/21 1137 04/26/21 0304 04/28/21 0500  ?Weight: 128.6 kg 126.8 kg 124.9 kg  ? ? ?Recent Labs  ?Lab 04/27/21 ?0103 04/27/21 ?1516 04/28/21 ?0105  ?NA 135 135 133*  ?K 5.0 4.8 4.6  ?CL 102 101 101  ?CO2 20* 23 23  ?GLUCOSE 68* 100* 115*  ?BUN 64* 51* 41*  ?CREATININE 9.20* 7.24* 6.00*  ?CALCIUM 8.9 8.4* 8.4*  ?PHOS  3.4 2.5 2.1*  ? ? ?Recent Labs  ?Lab 04/23/21 ?1137 04/23/21 ?1329 04/25/21 ?0231 04/26/21 ?0305 04/27/21 ?0103 04/28/21 ?0105  ?WBC 6.6   < > 7.6 6.4 5.7 5.0  ?NEUTROABS 5.3  --  6.6 5.1  --   --   ?HGB 12.7*   < > 12.9* 12.9* 13.3 12.9*  ?HCT 38.5*   < > 37.0* 37.7* 37.6* 36.3*  ?MCV 98.0   < > 93.4 92.4 91.3 91.9  ?PLT 143*   < > 125* 111* 110* 106*  ? < > = values in this interval not displayed.  ? ? ? ?Scheduled Meds: ? aspirin EC  81 mg Oral q morning  ? atropine  1 drop Right Eye QHS  ? brimonidine  1 drop Left Eye TID  ? Chlorhexidine Gluconate Cloth  6 each Topical Q0600  ? dorzolamide-timolol  1 drop Left Eye BID  ? feeding supplement  237 mL Oral TID BM  ? insulin aspart  0-6 Units Subcutaneous TID WC  ? latanoprost  1 drop Left Eye QHS  ? mouth rinse  15 mL Mouth Rinse BID  ? pantoprazole  40 mg Oral Daily  ? pilocarpine  1 drop Left Eye TID  ? polyethylene glycol  17 g Oral Daily  ? prednisoLONE acetate  1 drop Right Eye QHS  ? sodium bicarbonate  1,300 mg Oral BID  ? triamcinolone ointment  1 application Topical Daily  ? ?Continuous Infusions: ?  prismasol BGK 4/2.5 400 mL/hr at 04/27/21 2045  ?  prismasol BGK 4/2.5 400 mL/hr at 04/27/21 2051  ? dextrose 50 mL/hr at 04/28/21 0900  ? heparin Stopped (04/28/21 6720)  ? prismasol BGK 4/2.5 1,500 mL/hr at 04/28/21 0650  ? sodium phosphate  Dextrose 5% IVPB    ? ?PRN Meds:.acetaminophen, bisacodyl, heparin, heparin, metoprolol tartrate ? ?ABG ?   ?Component Value Date/Time  ? HCO3 11.7 (L) 04/23/2021 1329  ? TCO2 12 (L) 04/23/2021 1329  ? ACIDBASEDEF 14.0 (H) 04/23/2021 1329  ? O2SAT 92 04/23/2021 1329  ? ? ?A/P  ?Dialysis dependent anuric AKI, unclear etiology,  ?nl GFR as of 04/2020.  ?Neg renal US at admission.    ?No UA but gross hematura on foley placement unclear if traumatic.   ?Failed diuresis now on CRRT primarily for volume control but no pressors. 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 ?Pearson Grippe, MD ?[facility] ? ?

## 2021-04-28 NOTE — Progress Notes (Addendum)
eLink Physician-Brief Progress Note ?Patient Name: Austin Wheeler ?DOB: 07/20/1940 ?MRN: [id number] ? ? ?Date of Service ? 04/28/2021  ?HPI/Events of Note ? Called by RN for significant left IV site bleed. ?Pt on heparin gtt for Afib ?Labs reviewed. Plt 106 (3/1) PT/INR 18.3/1.5 (2/28) ?  ?eICU Interventions ? Hold heparin for bleed and plan for renal biopsy in am ?Thrombi pad ordered ?Repeat PT/INR tomorrow am  ? ? ? ?Intervention Category ?Intermediate Interventions: Coagulopathy - evaluation and management ? ?Junelle Hashemi Rodman Pickle ?04/28/2021, 10:35 PM ?

## 2021-04-28 NOTE — Progress Notes (Signed)
Heparin held at 0712, NP, Gerald Leitz notified, then notified pharmacy, [facility]ig[facility] Eisenmenger, and MD, Dr. Shearon Stalls. Bleeding from old PIV site moderately since 3-4AM per night shift RN. Upon entering room ,pillow and pad was saturated with blood near left arm IV site. Scant bleeding from gums, and minimal-moderate bleeding from foley site.  ? ?Will continue to monitor. Per MD, hold heparin for now. ? ?Dewaine Oats ? ?

## 2021-04-28 NOTE — Progress Notes (Signed)
Interventional Radiology Brief Note: ? ?RN called to discuss hep drip.  Plan per primary team is resume hep drip for now and RN asking of procedure timing tomorrow so as to not interfere.  As timing is currently unknown, plan made to resume heparin drip now and hold at [facility].  Pharmacy aware and has placed order.  ? ?Brynda Greathouse, MS RD PA-C ? ? ?

## 2021-04-28 NOTE — Consult Note (Addendum)
Chief Complaint: Acute on chronic renal failure  Referring Physician(s): Joelyn Oms  Supervising Physician: Ruthann Cancer  Patient Status: [facility] - In-pt  History of Present Illness: Austin Wheeler is a 81 y.o. 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  Renal US showed= Increased renal cortical echogenicity bilaterally, as can be seen in medical renal disease. No hydronephrosis. Bilateral pleural effusions and large volume ascites noted.  He failed diuresis and is now on CRRT.  He is known to our service. He underwent paracentesis on 04/24/21 yielding 8.3 L.  We are asked to perform a random renal biopsy.  He is on heparin drip for new onset Afib.  His wife and another family member is at bedside.  Past Medical History:  Diagnosis Date   Blind    Diabetes mellitus without complication ([place])    Hypertension     Past Surgical History:  Procedure Laterality Date   IR PARACENTESIS  04/23/2021    Allergies: Guaifenesin, Influenza vaccines, and Lexapro [escitalopram]  Medications: Prior to Admission medications   Medication Sig Start Date End Date Taking? Authorizing Provider  acetaminophen (TYLENOL) 500 MG tablet Take 500 mg by mouth daily as needed for headache (pain).   Yes [provider]  amLODipine (NORVASC) 10 MG tablet Take 10 mg by mouth every morning. 04/11/12  Yes [provider]  aspirin EC 81 MG tablet 81 mg every morning.   Yes [provider]  atropine 1 % ophthalmic solution Place 1 drop into the right eye at bedtime. 11/11/19  Yes [provider]  brimonidine (ALPHAGAN) 0.2 % ophthalmic solution Place 1 drop into the left eye 3 (three) times daily. 02/22/21  Yes [provider]  cholecalciferol (VITAMIN D3) 25 MCG (1000 UNIT) tablet Take 1,000 Units by mouth 2 (two) times daily.   Yes [provider]  dorzolamide-timolol (COSOPT) 22.3-6.8 MG/ML ophthalmic solution Place 1 drop into the  left eye 2 (two) times daily. 04/11/12  Yes [provider]  doxazosin (CARDURA) 8 MG tablet Take 1 tablet (8 mg total) by mouth daily. Patient taking differently: Take 8 mg by mouth every morning. 11/13/20  Yes Stoioff, Ronda Fairly, MD  dutasteride (AVODART) 0.5 MG capsule Take 1 capsule (0.5 mg total) by mouth daily. Patient taking differently: Take 0.5 mg by mouth every Monday, Wednesday, and Friday. 11/13/20  Yes Stoioff, Ronda Fairly, MD  furosemide (LASIX) 40 MG tablet Take 40 mg by mouth every morning. 11/18/20  Yes [provider]  Garlic 884 MG TABS Take 500 mg by mouth every morning.   Yes [provider]  latanoprost (XALATAN) 0.005 % ophthalmic solution Place 1 drop into the left eye at bedtime. 04/11/12  Yes [provider]  metFORMIN (GLUCOPHAGE) 500 MG tablet Take 250 mg by mouth 2 (two) times daily. 03/20/13  Yes [provider]  metoprolol tartrate (LOPRESSOR) 50 MG tablet Take 50 mg by mouth 2 (two) times daily. 05/23/14  Yes [provider]  Multiple Vitamin (MULTIVITAMIN WITH MINERALS) TABS tablet Take 1 tablet by mouth in the morning.   Yes [provider]  Multiple Vitamins-Minerals (PRESERVISION AREDS 2) CAPS Take 1 capsule by mouth 2 (two) times daily.   Yes [provider]  olmesartan-hydrochlorothiazide (BENICAR HCT) 40-12.5 MG tablet Take 1 tablet by mouth every morning. 07/16/12  Yes [provider]  OVER THE COUNTER MEDICATION Take 1 capsule by mouth See admin instructions. Go Out (OTC) - take one capsule by mouth  on Tuesday and Thursday morning (for gout)   Yes [provider]  pilocarpine (PILOCAR) 4 % ophthalmic solution Place 1 drop into the left eye 3 (three) times daily. 06/20/17  Yes [provider]  potassium chloride SA (KLOR-CON M) 20 MEQ tablet Take 10 mEq by mouth every morning.   Yes [provider]  prednisoLONE acetate (PRED FORTE) 1 % ophthalmic suspension Place 1  drop into the right eye at bedtime. 11/12/19  Yes [provider]  triamcinolone ointment (KENALOG) 0.1 % Apply 1 application topically See admin instructions. Apply topically legs nightly after compression stockings are removed 11/20/20  Yes [provider]  vitamin C (ASCORBIC ACID) 500 MG tablet Take 500 mg by mouth every Saturday.   Yes [provider]  vitamin E 180 MG (400 UNITS) capsule Take 400 Units by mouth every morning.   Yes [provider]     History reviewed. No pertinent family history.  Social History   Socioeconomic History   Marital status: Married    Spouse name: Not on file   Number of children: Not on file   Years of education: Not on file   Highest education level: Not on file  Occupational History   Not on file  Tobacco Use   Smoking status: Never   Smokeless tobacco: Never  Vaping Use   Vaping Use: Never used  Substance and Sexual Activity   Alcohol use: Not Currently   Drug use: Never   Sexual activity: Yes  Other Topics Concern   Not on file  Social History Narrative   Not on file   Social Determinants of Health   Financial Resource Strain: Not on file  Food Insecurity: Not on file  Transportation Needs: Not on file  Physical Activity: Not on file  Stress: Not on file  Social Connections: Not on file    Review of Systems  Unable to perform ROS: Other  Patient very sleepy  Vital Signs: BP 91/67    Pulse 75    Temp (!) 97.2 F (36.2 C) (Oral)    Resp 13    Ht 6\' 6"  (1.981 m)    Wt 275 lb 5.7 oz (124.9 kg)    SpO2 100%    BMI 31.82 kg/m   Physical Exam Vitals reviewed.  Constitutional:      Appearance: He is ill-appearing.     Comments: Sleepy  HENT:     Head: Normocephalic and atraumatic.  Eyes:     Extraocular Movements: Extraocular movements intact.  Cardiovascular:     Rate and Rhythm: Normal rate and regular rhythm.  Pulmonary:     Effort: Pulmonary effort is normal. No respiratory distress.      Breath sounds: Normal breath sounds.  Abdominal:     General: There is no distension.     Palpations: Abdomen is soft.     Tenderness: There is no abdominal tenderness.  Musculoskeletal:        General: Normal range of motion.     Cervical back: Normal range of motion.  Skin:    General: Skin is warm and dry.  Neurological:     General: No focal deficit present.    Imaging: CT ABDOMEN PELVIS WO CONTRAST  Result Date: 04/23/2021 CLINICAL DATA:  Renal failure, anuria EXAM: CT ABDOMEN AND PELVIS WITHOUT CONTRAST TECHNIQUE: Multidetector CT imaging of the abdomen and pelvis was performed following the standard protocol without IV contrast. RADIATION DOSE REDUCTION: This exam was performed according to  the departmental dose-optimization program which includes automated exposure control, adjustment of the mA and/or kV according to patient size and/or use of iterative reconstruction technique. COMPARISON:  None. FINDINGS: Lower chest: Trace bilateral pleural effusions with associated compressive atelectasis. Cardiomegaly. Coronary artery atherosclerosis. Hepatobiliary: Unremarkable unenhanced appearance of the liver. No focal liver lesion identified. Gallbladder within normal limits. No hyperdense gallstone. No biliary dilatation. Pancreas: Unremarkable. No pancreatic ductal dilatation or surrounding inflammatory changes. Spleen: Normal in size without focal abnormality. Adrenals/Urinary Tract: Unremarkable adrenal glands. 3.3 cm lower pole left renal cyst. Kidneys are otherwise unremarkable. No renal stone or hydronephrosis. Urinary bladder is decompressed by Foley catheter. Stomach/Bowel: Small hiatal hernia. Stomach appears otherwise within normal limits. Colonic diverticulosis. No evidence of bowel wall thickening, distention, or inflammatory changes. Vascular/Lymphatic: Scattered aortoiliac atherosclerotic calcifications without aneurysm. No abdominopelvic lymphadenopathy. Reproductive:  Prostatomegaly. Other: Moderate-large volume ascites. No pneumoperitoneum. No abdominal wall hernia. Musculoskeletal: Diffuse anasarca. Scattered densely sclerotic bone islands within the pelvis and spine. No acute osseous findings. IMPRESSION: 1. No evidence of obstructive uropathy. 2. Moderate-large volume ascites with diffuse anasarca. 3. Trace bilateral pleural effusions with associated compressive atelectasis. 4. Colonic diverticulosis without evidence of acute diverticulitis. 5. Prostatomegaly. 6. Aortic Atherosclerosis (ICD10-I70.0). Electronically Signed   By: Davina Poke D.O.   On: 04/23/2021 14:13   US RENAL  Result Date: 04/23/2021 CLINICAL DATA:  Acute kidney injury EXAM: RENAL / URINARY TRACT ULTRASOUND COMPLETE COMPARISON:  Same day CT. FINDINGS: Right Kidney: Renal measurements: 13.1 x 6.0 x 7.3 cm = volume: 300.4 mL. Increased renal cortical echogenicity. No hydronephrosis. Left Kidney: Renal measurements: 13.9 x 5.7 x 6.5 cm = volume: 269.0 mL. Increased renal cortical echogenicity. No hydronephrosis. There is a simple appearing cyst in the inferior pole measuring 4.3 x 3.0 x 2.8 cm. Bladder: Foley catheter in place. Other: Bilateral pleural effusions.  Large volume ascites. IMPRESSION: Increased renal cortical echogenicity bilaterally, as can be seen in medical renal disease. No hydronephrosis. Bilateral pleural effusions and large volume ascites noted. Electronically Signed   By: Maurine Simmering M.D.   On: 04/23/2021 14:52   DG CHEST PORT 1 VIEW  Result Date: 04/25/2021 CLINICAL DATA:  Placement of central venous catheter EXAM: PORTABLE CHEST 1 VIEW COMPARISON:  04/23/2021 FINDINGS: Transverse diameter of heart is increased. Central pulmonary vessels are more prominent. Increased interstitial and alveolar markings are seen in the parahilar regions and lower lung fields, more so in the left lower lung fields. There is no pneumothorax. There is interval placement of large caliber right IJ  central venous catheter with its tip in superior vena cava. IMPRESSION: Cardiomegaly. Central pulmonary vessels are more prominent suggesting CHF. Tip of right IJ central venous catheter is seen in the superior vena cava. There is no pneumothorax. Electronically Signed   By: Elmer Picker M.D.   On: 04/25/2021 15:56   DG Chest Portable 1 View  Result Date: 04/23/2021 CLINICAL DATA:  81 year old male with dyspnea, atrial fibrillation. EXAM: PORTABLE CHEST - 1 VIEW COMPARISON:  None. FINDINGS: The mediastinal contours are within normal limits. Cardiomegaly. Low lung volumes. Mild bibasilar streaky opacities. No evidence of significant pleural effusion or pneumothorax. No acute osseous abnormality. IMPRESSION: Cardiomegaly with low lung volumes and bibasilar subsegmental atelectasis. Electronically Signed   By: Ruthann Cancer M.D.   On: 04/23/2021 12:35   ECHOCARDIOGRAM COMPLETE  Result Date: 04/24/2021    ECHOCARDIOGRAM REPORT   Patient Name:   ALIK MAWSON Date of Exam: 04/24/2021 Medical Rec #:  [id number]  Height:       78.0 in Accession #:    [id number]     Weight:       269.4 lb Date of Birth:  1941/02/25      BSA:          2.562 m Patient Age:    68 years       BP:           101/75 mmHg Patient Gender: M              HR:           68 bpm. Exam Location:  Inpatient Procedure: 2D Echo, Color Doppler and Cardiac Doppler Indications:    N02.72 Acute diastolic (congestive) heart failure  History:        Patient has no prior history of Echocardiogram examinations.                 Risk Factors:Hypertension and Diabetes.  Sonographer:    Raquel Sarna Senior RDCS Referring Phys: [id number] [place]  1. Left ventricular ejection fraction, by estimation, is 55 to 60%. The left ventricle has normal function. The left ventricle has no regional wall motion abnormalities. The left ventricular internal cavity size was moderately dilated. Left ventricular diastolic parameters are indeterminate. There is  the interventricular septum is flattened in systole and diastole, consistent with right ventricular pressure and volume overload.  2. Right ventricular systolic function is moderately reduced. The right ventricular size is severely enlarged. There is moderately elevated pulmonary artery systolic pressure.  3. Left atrial size was mildly dilated.  4. Right atrial size was severely dilated.  5. Moderate pleural effusion in the left lateral region.  6. The mitral valve is normal in structure. Mild mitral valve regurgitation. No evidence of mitral stenosis.  7. Tricuspid valve regurgitation is moderate to severe.  8. The aortic valve is tricuspid. Aortic valve regurgitation is trivial. Aortic valve sclerosis is present, with no evidence of aortic valve stenosis.  9. Aortic dilatation noted. There is mild dilatation of the aortic root, measuring 43 mm. There is mild dilatation of the ascending aorta, measuring 43 mm. There is mild dilatation of the aortic arch, measuring 43 mm. 10. The inferior vena cava is dilated in size with <50% respiratory variability, suggesting right atrial pressure of 15 mmHg. Comparison(s): No prior Echocardiogram. FINDINGS  Left Ventricle: Left ventricular ejection fraction, by estimation, is 55 to 60%. The left ventricle has normal function. The left ventricle has no regional wall motion abnormalities. The left ventricular internal cavity size was moderately dilated. There is no left ventricular hypertrophy. The interventricular septum is flattened in systole and diastole, consistent with right ventricular pressure and volume overload. Left ventricular diastolic parameters are indeterminate. Right Ventricle: The right ventricular size is severely enlarged. Right ventricular systolic function is moderately reduced. There is moderately elevated pulmonary artery systolic pressure. The tricuspid regurgitant velocity is 2.89 m/s, and with an assumed right atrial pressure of 15 mmHg, the estimated  right ventricular systolic pressure is 53.6 mmHg. Left Atrium: Left atrial size was mildly dilated. Right Atrium: Right atrial size was severely dilated. Pericardium: Trivial pericardial effusion is present. Mitral Valve: The mitral valve is normal in structure. Mild mitral valve regurgitation. No evidence of mitral valve stenosis. Tricuspid Valve: The tricuspid valve is normal in structure. Tricuspid valve regurgitation is moderate to severe. No evidence of tricuspid stenosis. Aortic Valve: The aortic valve is tricuspid. Aortic valve regurgitation is trivial. Aortic valve sclerosis is present,  with no evidence of aortic valve stenosis. Pulmonic Valve: The pulmonic valve was normal in structure. Pulmonic valve regurgitation is mild. No evidence of pulmonic stenosis. Aorta: Aortic dilatation noted. There is mild dilatation of the aortic root, measuring 43 mm. There is mild dilatation of the ascending aorta, measuring 43 mm. There is mild dilatation of the aortic arch, measuring 43 mm. Venous: The inferior vena cava is dilated in size with less than 50% respiratory variability, suggesting right atrial pressure of 15 mmHg. IAS/Shunts: No atrial level shunt detected by color flow Doppler. Additional Comments: There is a moderate pleural effusion in the left lateral region.  LEFT VENTRICLE PLAX 2D LVIDd:         6.30 cm LVIDs:         4.60 cm LV PW:         0.90 cm LV IVS:        1.20 cm LVOT diam:     2.10 cm LV SV:         51 LV SV Index:   20 LVOT Area:     3.46 cm  RIGHT VENTRICLE RV S prime:     10.70 cm/s TAPSE (M-mode): 1.9 cm LEFT ATRIUM             Index        RIGHT ATRIUM           Index LA diam:        4.50 cm 1.76 cm/m   RA Area:     39.30 cm LA Vol (A2C):   96.8 ml 37.78 ml/m  RA Volume:   169.00 ml 65.96 ml/m LA Vol (A4C):   94.6 ml 36.92 ml/m LA Biplane Vol: 99.4 ml 38.79 ml/m  AORTIC VALVE LVOT Vmax:   72.97 cm/s LVOT Vmean:  52.700 cm/s LVOT VTI:    0.148 m  AORTA Ao Root diam: 4.30 cm Ao Asc  diam:  4.30 cm MITRAL VALVE               TRICUSPID VALVE MV Area (PHT): 3.42 cm    TR Peak grad:   33.4 mmHg MV Decel Time: 222 msec    TR Vmax:        289.00 cm/s MV E velocity: 75.50 cm/s MV A velocity: 26.20 cm/s  SHUNTS MV E/A ratio:  2.88        Systemic VTI:  0.15 m                            Systemic Diam: 2.10 cm Kirk Ruths MD Electronically signed by Kirk Ruths MD Signature Date/Time: 04/24/2021/1:50:43 PM    Final    IR Paracentesis  Result Date: 04/24/2021 INDICATION: Patient history congestive heart failure, admitted with acute renal and anasarca found to have new onset ascites. Request IR for therapeutic paracentesis EXAM: ULTRASOUND GUIDED THERAPEUTIC PARACENTESIS MEDICATIONS: 7 mL 1% lidocaine COMPLICATIONS: None immediate. PROCEDURE: Informed written consent was obtained from the patient after a discussion of the risks, benefits and alternatives to treatment. A timeout was performed prior to the initiation of the procedure. Initial ultrasound scanning demonstrates a large amount of ascites within the right lower abdominal quadrant. The right lower abdomen was prepped and draped in the usual sterile fashion. 1% lidocaine was used for local anesthesia. Following this, a 19 gauge, 7-cm, Yueh catheter was introduced. An ultrasound image was saved for documentation purposes. The paracentesis was performed. The catheter was removed  and a dressing was applied. The patient tolerated the procedure well without immediate post procedural complication. FINDINGS: A total of approximately 8.3 L of clear yellow fluid was removed. IMPRESSION: Successful ultrasound-guided paracentesis yielding 8.3 liters of peritoneal fluid. Read by Candiss Norse, PA-C Electronically Signed   By: Aletta Edouard M.D.   On: 04/24/2021 07:53    Labs:  CBC: Recent Labs    04/25/21 0231 04/26/21 0305 04/27/21 0103 04/28/21 0105  WBC 7.6 6.4 5.7 5.0  HGB 12.9* 12.9* 13.3 12.9*  HCT 37.0* 37.7* 37.6* 36.3*   PLT 125* 111* 110* 106*    COAGS: Recent Labs    04/23/21 2021 04/27/21 0103  INR 1.6* 1.5*    BMP: Recent Labs    04/26/21 1554 04/27/21 0103 04/27/21 1516 04/28/21 0105  NA 135 135 135 133*  K 5.3* 5.0 4.8 4.6  CL 99 102 101 101  CO2 20* 20* 23 23  GLUCOSE 83 68* 100* 115*  BUN 77* 64* 51* 41*  CALCIUM 8.9 8.9 8.4* 8.4*  CREATININE 10.65* 9.20* 7.24* 6.00*  GFRNONAA 4* 5* 7* 9*    LIVER FUNCTION TESTS: Recent Labs    04/23/21 1137 04/24/21 0546 04/25/21 0231 04/25/21 1550 04/26/21 0305 04/26/21 1554 04/27/21 0103 04/27/21 1516 04/28/21 0105  BILITOT 1.2  --  1.1  --  1.2  --   --   --   --   AST 18  --  16  --  19  --   --   --   --   ALT 11  --  10  --  9  --   --   --   --   ALKPHOS 80  --  62  --  65  --   --   --   --   PROT 7.1  --  6.1*  --  6.4*  --   --   --   --   ALBUMIN 3.4*   < > 3.4*   < > 3.4* 3.3* 3.2* 3.0* 2.9*   < > = values in this interval not displayed.    TUMOR MARKERS: No results for input(s): AFPTM, CEA, CA199, CHROMGRNA in the last 8760 hours.  Assessment and Plan:  Acute renal failure of unknown etiology.  Will proceed with image guided random renal biopsy tomorrow.  Will stop heparin drip at 5 am.  Risks and benefits of random renal biopsy was discussed with the patient's wife/ family including, but not limited to bleeding, infection, damage to adjacent structures or low yield requiring additional tests.  All of the questions were answered and there is agreement to proceed.  Consent signed and in chart.  Thank you for allowing our service to participate in ANCEL EASLER 's care.  Electronically Signed: Murrell Redden, PA-C   04/28/2021, 11:28 AM      I spent a total of    25 Minutes in face to face in clinical consultation, greater than 50% of which was counseling/coordinating care for random renal biopsy.

## 2021-04-28 NOTE — Progress Notes (Signed)
?NAME:  Austin Wheeler, MRN:  [id number], DOB:  10/25/1940, LOS: 3 ?ADMISSION DATE:  04/23/2021, CONSULTATION DATE:  2/26 ?REFERRING MD:  Sheikh, CHIEF COMPLAINT:  AKI  ?  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 ?  ?Patient started to have increasing bilateral lower extremities swelling about 3 months ago and was started of po Lasix 40 mg daily, swelling appears to be improving, patient however had episodes of feeling lightheadedness and "too much urination" and decided to cut down Lasix from 40 mg daily to 20 mg daily about 1 month ago. Condition remained stable since until about 1 week ago, patient started to notice swelling in his abdomen and legs, and decreased urine output.  Finally, for the past 3 days, there has been barely any urine came out. He said he feeling "bladder fullness" when having BM, but no urine came out. No abdominal pain, no back pain, no shortness of breath, no fever or chills. No Hx of CHF or stroke. ?  ?Pertinent  Medical History  ?HTN, well-controlled DM, BPH, legally blind in both eyes, prostate cancer with active surveillance ? ?Significant Hospital Events: ?Including procedures, antibiotic start and stop dates in addition to other pertinent events   ?2/24: Admitted to [facility] for acute on chronic renal failure, new onset a. Fib. Nephrology consulted; high dose Lasix trial initiated, large volume paracentesis of 8.3 L ?2/26: PCCM consulted. Transfer to ICU for CRRT  ?2/27: tolerated CRRT ?3/1 Heparin drip restarted 2/28 and patient has had recurrent hematuria and bleeding IV site. Heparin drip now on hold  ? ?Interim History / Subjective:  ?Complaints of right leg cramps  ?Wife at bedside and updated  ? ?Objective   ?Blood pressure 108/80, pulse 90, temperature (!) 97.4 ?F  (36.3 ?C), temperature source Oral, resp. rate 14, height 6' 6" (1.981 m), weight 124.9 kg, SpO2 (!) 79 %. ?   ?   ? ?Intake/Output Summary (Last 24 hours) at 04/28/2021 0826 ?Last data filed at 04/28/2021 0800 ?Gross per 24 hour  ?Intake 2198.29 ml  ?Output 6311 ml  ?Net -4112.71 ml  ? ? ?Filed Weights  ? 04/25/21 1137 04/26/21 0304 04/28/21 0500  ?Weight: 128.6 kg 126.8 kg 124.9 kg  ? ? ?Examination: ?General: Acute on chronically ill appearing elderly male lying in bed, in NAD ?HEENT: MC/AT, MM pink/moist, PERRL, legally blind   ?Neuro: Alert and oriented x3 ?CV: s1s2 regular rate and rhythm, no murmur, rubs, or gallops,  ?PULM:  Clear to ascultation, no increased work of breathing, no added breath sounds  ?GI: soft, bowel sounds active in all 4 quadrants, non-tender, non-distended, tolerating oral diet ?Extremities: warm/dry, no edema  ?Skin: no rashes or lesions ? ?Resolved Hospital Problem list   ?Uremic encephalopathy ?Hyperkalemia ?NAGMA ? ?Assessment & Plan:  ? ?Acute Kidney Injury with Anuria ?Anion gap metabolic acidosis ?-Patient with acute kidney injury of unclear origin.  ?-He tolerated CRRT 2/27  ?-Differential includes ATN versus nephrotic syndrome versus acute GN given hematuria. ?-serologies for acute GN thus far negative, hep b/c negative, ANA, unable thus far to obtain urine sample for multiple myeloma work up ?P: ?Nephrology following, appreciate assistance  ?CRRT per nephrology ?Pending Renal biopsy ?Continue sodium bicarb tabs   ?Follow renal function  ?Monitor urine output ?Trend Bmet ?Avoid nephrotoxins ?Ensure adequate renal   perfusion  ? ?New onset atrial fibrillation ?-Afib on tele. Rate control with metoprolol.   ?-CHADVASC score of 4 (age, HTN, DM), initially holding anticoagulation in setting of gross hematuria. Overnight patient had pink output when foley flushed with NS, no episodes of gross hematuria. ?P: ?Heparin drip started 2/28 but hematuria reoccured, drip now on hold ?Continuous  telemetry  ? 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 ?P: ?Continue home eye drop regiment  ?Supportive care  ? ?Thrombocytopenia ?-Platelets 110, INR 1.5 ?P: ?Trend CBC  ?Monitor for signs of bleeding  ?Transfuse per protocol  ? ?Hypoglycemia ?-BG 67 overnight,  ?P: ?Monitor CBG  ?Encourage oral intake  ? ?Hematuria ?History of prostate cancer, active surveillance with urology.  ?-Patient is anuric with light pink output  ?P: ?Heparin drip stopped this AM can likely resume later this afternoon with goal of low end of normal  ? ?Best Practice (right click and "Reselect all SmartList Selections" daily)  ? ?Diet/type: full liquids  ?DVT prophylaxis: Heparin ?GI prophylaxis: PPI ?Lines: Central line ?Foley:  Yes, and it is still needed ?Code Status:  DNR ?Last date of multidisciplinary goals of care discussion [updated wife and son at bedside this AM] ? ?CRITICAL CARE ?Performed by: Austin Wheeler ? ?Total critical care time: 38 minutes ? ?Critical care time was exclusive of separately billable procedures and treating other patients. ? ?Critical care was necessary to treat or prevent imminent or life-threatening deterioration. ? ?Critical care was time spent personally by me on the following activities: development of treatment plan with patient and/or surrogate as well as nursing, discussions with consultants, evaluation of patient's response to treatment, examination of patient, obtaining history from patient or surrogate, ordering and performing treatments and interventions, ordering and review of laboratory studies, ordering and review of radiographic studies, pulse oximetry and re-evaluation of patient's condition. ? ?Austin Wheeler D. Harris, NP-C ?[facility] ?Personal contact information can be found on Amion  ?04/28/2021, 8:35 AM ? ? ?

## 2021-04-29 DIAGNOSIS — Z515 Encounter for palliative care: Secondary | ICD-10-CM | POA: Diagnosis not present

## 2021-04-29 DIAGNOSIS — K5900 Constipation, unspecified: Secondary | ICD-10-CM | POA: Diagnosis not present

## 2021-04-29 DIAGNOSIS — N179 Acute kidney failure, unspecified: Secondary | ICD-10-CM | POA: Diagnosis not present

## 2021-04-29 LAB — CBC
HCT: 31.9 % — ABNORMAL LOW (ref 39.0–52.0)
Hemoglobin: 10.7 g/dL — ABNORMAL LOW (ref 13.0–17.0)
MCH: 31.7 pg (ref 26.0–34.0)
MCHC: 33.5 g/dL (ref 30.0–36.0)
MCV: 94.4 fL (ref 80.0–100.0)
Platelets: 125 10*3/uL — ABNORMAL LOW (ref 150–400)
RBC: 3.38 MIL/uL — ABNORMAL LOW (ref 4.22–5.81)
RDW: 14.8 % (ref 11.5–15.5)
WBC: 5.9 10*3/uL (ref 4.0–10.5)
nRBC: 0 % (ref 0.0–0.2)

## 2021-04-29 LAB — RENAL FUNCTION PANEL
Albumin: 2.7 g/dL — ABNORMAL LOW (ref 3.5–5.0)
Albumin: 2.8 g/dL — ABNORMAL LOW (ref 3.5–5.0)
Albumin: 2.8 g/dL — ABNORMAL LOW (ref 3.5–5.0)
Anion gap: 8 (ref 5–15)
Anion gap: 9 (ref 5–15)
Anion gap: 7 (ref 5–15)
BUN: 31 mg/dL — ABNORMAL HIGH (ref 8–23)
BUN: 31 mg/dL — ABNORMAL HIGH (ref 8–23)
BUN: 19 mg/dL (ref 8–23)
CO2: 26 mmol/L (ref 22–32)
CO2: 27 mmol/L (ref 22–32)
CO2: 27 mmol/L (ref 22–32)
Calcium: 8.4 mg/dL — ABNORMAL LOW (ref 8.9–10.3)
Calcium: 8.4 mg/dL — ABNORMAL LOW (ref 8.9–10.3)
Calcium: 8 mg/dL — ABNORMAL LOW (ref 8.9–10.3)
Chloride: 99 mmol/L (ref 98–111)
Chloride: 99 mmol/L (ref 98–111)
Chloride: 100 mmol/L (ref 98–111)
Creatinine, Ser: 4.96 mg/dL — ABNORMAL HIGH (ref 0.61–1.24)
Creatinine, Ser: 5.02 mg/dL — ABNORMAL HIGH (ref 0.61–1.24)
Creatinine, Ser: 3.61 mg/dL — ABNORMAL HIGH (ref 0.61–1.24)
GFR, Estimated: 11 mL/min — ABNORMAL LOW (ref >60)
GFR, Estimated: 11 mL/min — ABNORMAL LOW (ref >60)
GFR, Estimated: 16 mL/min — ABNORMAL LOW (ref >60)
Glucose, Bld: 111 mg/dL — ABNORMAL HIGH (ref 70–99)
Glucose, Bld: 114 mg/dL — ABNORMAL HIGH (ref 70–99)
Glucose, Bld: 135 mg/dL — ABNORMAL HIGH (ref 70–99)
Phosphorus: 2.7 mg/dL (ref 2.5–4.6)
Phosphorus: 2.7 mg/dL (ref 2.5–4.6)
Phosphorus: 2.1 mg/dL — ABNORMAL LOW (ref 2.5–4.6)
Potassium: 4.7 mmol/L (ref 3.5–5.1)
Potassium: 4.7 mmol/L (ref 3.5–5.1)
Potassium: 3.8 mmol/L (ref 3.5–5.1)
Sodium: 134 mmol/L — ABNORMAL LOW (ref 135–145)
Sodium: 134 mmol/L — ABNORMAL LOW (ref 135–145)
Sodium: 134 mmol/L — ABNORMAL LOW (ref 135–145)

## 2021-04-29 LAB — GLUCOSE, CAPILLARY
Glucose-Capillary: 93 mg/dL (ref 70–99)
Glucose-Capillary: 147 mg/dL — ABNORMAL HIGH (ref 70–99)
Glucose-Capillary: 84 mg/dL (ref 70–99)
Glucose-Capillary: 106 mg/dL — ABNORMAL HIGH (ref 70–99)

## 2021-04-29 LAB — HIV ANTIBODY (ROUTINE TESTING W REFLEX): HIV Screen 4th Generation wRfx: NONREACTIVE

## 2021-04-29 LAB — PROTIME-INR
INR: 1.4 — ABNORMAL HIGH (ref 0.8–1.2)
Prothrombin Time: 17.1 seconds — ABNORMAL HIGH (ref 11.4–15.2)

## 2021-04-29 LAB — HEPATITIS B SURFACE ANTIGEN: Hepatitis B Surface Ag: NONREACTIVE

## 2021-04-29 LAB — HEPATITIS B SURFACE ANTIBODY,QUALITATIVE: Hep B S Ab: NONREACTIVE

## 2021-04-29 LAB — MAGNESIUM: Magnesium: 2.5 mg/dL — ABNORMAL HIGH (ref 1.7–2.4)

## 2021-04-29 MED ORDER — SODIUM CHLORIDE 0.9 % IV SOLN: 100.0000 mL | INTRAVENOUS | Status: DC | PRN

## 2021-04-29 MED ORDER — HEPARIN SODIUM (PORCINE) 1000 UNIT/ML DIALYSIS
1000.0000 [IU] | INTRAMUSCULAR | Status: DC | PRN
  Administered 2021-04-29: 1000 [IU] via INTRAVENOUS_CENTRAL
  Filled 2021-04-29: qty 1

## 2021-04-29 MED ORDER — ALTEPLASE 2 MG IJ SOLR
2.0000 mg | Freq: Once | INTRAMUSCULAR | Status: DC | PRN
  Filled 2021-04-29: qty 2

## 2021-04-29 MED ORDER — SODIUM CHLORIDE 0.9% FLUSH
10.0000 mL | Freq: Two times a day (BID) | INTRAVENOUS | Status: DC
  Administered 2021-04-29: 10 mL
  Administered 2021-04-29: 20 mL
  Administered 2021-04-29 – 2021-05-09 (×17): 10 mL
  Administered 2021-05-10: 20 mL
  Administered 2021-05-10 – 2021-05-13 (×6): 10 mL

## 2021-04-29 MED ORDER — SODIUM CHLORIDE 0.9% FLUSH: 10.0000 mL | INTRAVENOUS | Status: DC | PRN

## 2021-04-29 NOTE — Progress Notes (Signed)
Admit: 04/23/2021 ?LOS: 6 ? ?51M AKI (unclear cause) with remote hx/o hematuria/proteinuria and volume overload req CRRT 2/26  04/28/21 ? ?Subjective:  ?Now off CRRT ?BPs stable ?No UOP ?Serologies negative now including HIV, SPEP; ANA is pending ?Renal Bx postponed to 3/6 due to ASA use ? ?03/01 0701 - 03/02 0700 ?In: 1330.6 [P.O.:295; I.V.:737.7; IV Piggyback:257.9] ?Out: 2501 [Urine:50] ? ?Filed Weights  ? 04/26/21 0304 04/28/21 0500 04/29/21 0500  ?Weight: 126.8 kg 124.9 kg 119.4 kg  ? ? ?Scheduled Meds: ? atropine  1 drop Right Eye QHS  ? brimonidine  1 drop Left Eye TID  ? Chlorhexidine Gluconate Cloth  6 each Topical Q0600  ? dorzolamide-timolol  1 drop Left Eye BID  ? feeding supplement  237 mL Oral TID BM  ? insulin aspart  0-6 Units Subcutaneous TID WC  ? latanoprost  1 drop Left Eye QHS  ? mouth rinse  15 mL Mouth Rinse BID  ? pantoprazole  40 mg Oral Daily  ? pilocarpine  1 drop Left Eye TID  ? polyethylene glycol  17 g Oral Daily  ? prednisoLONE acetate  1 drop Right Eye QHS  ? sodium chloride flush  10-40 mL Intracatheter Q12H  ? triamcinolone ointment  1 application Topical Daily  ? ?Continuous Infusions: ? sodium chloride Stopped (04/28/21 1032)  ? sodium chloride    ? sodium chloride    ? dextrose 25 mL/hr at 04/29/21 1000  ? ?PRN Meds:.sodium chloride, sodium chloride, sodium chloride, acetaminophen, alteplase, bisacodyl, heparin, heparin, heparin, metoprolol tartrate, oxyCODONE, sodium chloride flush ? ?Current Labs: reviewed ? ?Physical Exam:  Blood pressure 112/76, pulse 83, temperature 97.7 ?F (36.5 ?C), temperature source Axillary, resp. rate (!) 24, height 6\' 6"  (1.981 m), weight 119.4 kg, SpO2 100 %. ?NAD, conversant ?Blind ?RRR nl s1s2 ?CTAB ?Still with 3+ LEE, abdomen less protuberant ?Nonfocal ? ?A ?Dialysis dependent anuric AKI, unclear etiology,  ?nl GFR as of 04/2020.  ?Neg renal US at admission.    ?No UA but gross hematura on foley placement unclear if traumatic.   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  ?P ?As above ?Daily weights, Daily Renal Panel, Strict I/Os, Avoid nephrotoxins (NSAIDs, judicious IV Contrast)   ?Medication Issues; ?Preferred narcotic agents for pain control are hydromorphone, fentanyl, and methadone. Morphine should not be used.  ?Baclofen should be avoided ?Avoid oral sodium phosphate and magnesium citrate based laxatives / bowel preps  ? ? ?Pearson Grippe MD ?04/29/2021, 10:34 AM ? ?Recent Labs  ?Lab 04/28/21 ?0105 04/28/21 ?1535 04/29/21 ?0415  ?NA 133* 134* 134*  134*  ?K 4.6 4.6 4.7  4.7  ?CL 101 100 99  99  ?CO2 23 26 26  27   ?GLUCOSE 115* 155* 114*  111*  ?BUN 41* 30* 31*  31*  ?CREATININE 6.00* 4.59* 4.96*  5.02*  ?CALCIUM 8.4* 8.1* 8.4*  8.4*  ?PHOS 2.1* 2.6 2.7  2.7  ? ?Recent Labs  ?Lab 04/23/21 ?1137 04/23/21 ?1329 04/25/21 ?0231 04/26/21 ?0305 04/27/21 ?0103 04/28/21 ?0105 04/29/21 ?7616  ?WBC 6.6   < > 7.6 6.4 5.7 5.0 5.9  ?NEUTROABS 5.3  --  6.6 5.1  --   --   --   ?  HGB 12.7*   < > 12.9* 12.9* 13.3 12.9* 10.7*  ?HCT 38.5*   < > 37.0* 37.7* 37.6* 36.3* 31.9*  ?MCV 98.0   < > 93.4 92.4 91.3 91.9 94.4  ?PLT 143*   < > 125* 111* 110* 106* 125*  ? < > = values in this interval not displayed.  ? ? ? ? ? ? ? ? ? ?  ?

## 2021-04-29 NOTE — Progress Notes (Signed)
Patient ID: Austin Wheeler, male   DOB: 04-23-1940, 81 y.o.   MRN: [id number] ? ? ? ?Progress Note from the Palliative Medicine Team at [facility] ? ? ?Patient Name: Austin Wheeler        ?Date: 04/29/2021 ?DOB: 1940-06-29  Age: 81 y.o. MRN#: [id number] ?Attending Physician: Spero Geralds, MD ?Primary Care Physician: Josetta Huddle, MD ?Admit Date: 04/23/2021 ? ? ?Medical records reviewed  ? 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 ? ?Nephrology consulted ? ?This NP visited patient at the bedside as a follow up for palliative medicine needs and emotional support, wife at the bedside.  Austin Wheeler alert and oriented and participated in today's conversation. ? ?Off CRRT, plan for intermittent hemodialysis.  Several labs still pending.  Renal biopsy postponed until 3-6 secondary.  Continues with severe anasarca, this is improving ? ?Plan of care ?-DNR/DNI ?-Symptom management ?-Patient and family are open to all offered and available medical interventions to prolong life.       All are hopeful for improvement ? ?Austin Wheeler tells me that he is comfortable currently with no complaints ? ? ?Austin Wheeler shared with me a brief life review today.  He enjoyed his work for many years with the [employer], his territory covered a large part of the [place].  He and his wife have been married for 50+ years they have 1 son who lives in [place].  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 ?Discussed with patient the importance of continued conversation with his family and their  medical providers regarding overall plan of care and treatment options,  ensuring decisions are within the context of the patients values and GOCs. ? ?Questions and concerns addressed   Discussed  with CCM ? ?PMT will continue to support holistically. ? ?This nurse practitioner informed  the patient/family and the attending that I will be out of the hospital until Monday morning.  If the patient is still hospitalized I will follow-up at that time. ? ?Call palliative medicine team phone # [phone] with questions or concerns in the interim  ? ?Wadie Lessen NP  ?Palliative Medicine Team Team Phone # [phone] ?Pager [phone] ?  ?

## 2021-04-29 NOTE — Progress Notes (Signed)
Request received for random renal biopsy.  ?The case was reviewed and approved by Dr. Serafina Royals and consult has been done.  ? ?Patient is on aspirin 81 mg which needs to be held for 3-5 days for random renal biopsy.  ?Attending and nephrology notified via secure chat.  ?Asked attending provider to hold aspirin tomorrow, will plan the biopsy next Monday at the earliest.  ? ?Please call IR for questions and concerns.  ? ?Tera Mater PA-C ?04/29/2021 8:06 AM ? ? ? ?

## 2021-04-29 NOTE — Progress Notes (Incomplete)
Pt arrived to 4E from HD/23M. CHG bath done. VSS.  ?

## 2021-04-29 NOTE — Progress Notes (Signed)
?NAME:  EBAN WEICK, MRN:  [id number], DOB:  09-Mar-1940, LOS: 3 ?ADMISSION DATE:  04/23/2021, CONSULTATION DATE:  2/26 ?REFERRING MD:  Alfredia Ferguson, CHIEF COMPLAINT:  AKI  ?  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 ?  ?Patient started to have increasing bilateral lower extremities swelling about 3 months ago and was started of po Lasix 40 mg daily, swelling appears to be improving, patient however had episodes of feeling lightheadedness and "too much urination" and decided to cut down Lasix from 40 mg daily to 20 mg daily about 1 month ago. Condition remained stable since until about 1 week ago, patient started to notice swelling in his abdomen and legs, and decreased urine output.  Finally, for the past 3 days, there has been barely any urine came out. He said he feeling "bladder fullness" when having BM, but no urine came out. No abdominal pain, no back pain, no shortness of breath, no fever or chills. No Hx of CHF or stroke. ?  ?Pertinent  Medical History  ?HTN, well-controlled DM, BPH, legally blind in both eyes, prostate cancer with active surveillance ? ?Significant Hospital Events: ?Including procedures, antibiotic start and stop dates in addition to other pertinent events   ?2/24: Admitted to [facility] for acute on chronic renal failure, new onset a. Fib. Nephrology consulted; high dose Lasix trial initiated, large volume paracentesis of 8.3 L ?2/26: PCCM consulted. Transfer to ICU for CRRT  ?2/27: tolerated CRRT ?3/1 Heparin drip restarted 2/28 and patient has had recurrent hematuria and bleeding IV site. Heparin drip now on hold  ? ?Interim History / Subjective:  ? ?No significant overnight events. HD like clotted off. Hemodynamically ? ?Morning labs reviewed.  Creatinine 5.0.  BUN 31.  Hemoglobin  down from 12.9 yesterday to 10.7 today.  Platelets stable at 125 ? ?Objective   ?Blood pressure 112/76, pulse 83, temperature 97.7 ?F (36.5 ?C), temperature source Axillary, resp. rate (!) 24, height '6\' 6"'  (1.981 m), weight 119.4 kg, SpO2 100 %. ?   ?   ? ?Intake/Output Summary (Last 24 hours) at 04/29/2021 1127 ?Last data filed at 04/29/2021 1000 ?Gross per 24 hour  ?Intake 1033.79 ml  ?Output 1473 ml  ?Net -439.21 ml  ? ? ?Filed Weights  ? 04/26/21 0304 04/28/21 0500 04/29/21 0500  ?Weight: 126.8 kg 124.9 kg 119.4 kg  ? ? ?Examination: ?General: Chronically ill-appearing male in no acute distress ?Cardiac: Irregular rhythm, regular rate.  +3 pitting edema to the lower extremities and gravity dependent regions ?Pulm: Breathing comfortably on room air.  Lung sounds are clear ?GI: Abdomen moderately distended, bowel sounds are active ?Skin: No rash or lesion on limited exam.  No surrounding erythema of the right IJ ? ?Resolved Hospital Problem list   ?Uremic encephalopathy ?Hyperkalemia ?NAGMA ? ?Assessment & Plan:  ? ?Acute Kidney Failure with Anuria requiring RRT ?-Patient with acute kidney injury of unclear origin. Differential includes ATN versus nephrotic syndrome versus acute GN given hematuria. ?-serologies for acute GN thus far negative, hep b/c negative, ANA, unable thus far to obtain urine sample for multiple myeloma work up ?-CRRT 2/26-3/1 ?- remains anuric; electrolytes stable. ?P: ?Nephrology following, appreciate assistance  ?iHD today via temp HD line ?Renal bx scheduled for 3/6. Hold aspirin ?Continue sodium bicarb tabs   ?Follow renal function  ?Monitor  urine output ?Avoid nephrotoxins ? ?New onset atrial fibrillation ?-Afib on tele. Rate control with metoprolol.   ?-CHADVASC score of 4 (age, HTN, DM), initially holding anticoagulation in setting of gross hematuria.  ?P: ?Continues to have bleeding issues with heparin gtt. Hgb down 2g from 3/1 with bleeding from PIV and hematuria. Will hold heparin for  now. Can consider starting DOAC prior to discharge. ?Continuous telemetry  ? 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 ?P: ?Continue home eye drop regiment  ?Supportive care  ? ?Thrombocytopenia. Stable. Continue to monitor.  ? ?Type 2 DM with Hypoglycemia. Hypoglycemia resolved. CBGs within goal. Continue to monitor intermittently. Add SSI if needed ? ?Best Practice (right click and "Reselect all SmartList Selections" daily)  ? ?Diet/type: full liquids  ?DVT prophylaxis: Heparin ?GI prophylaxis: PPI ?Lines: Central line ?Foley:  Yes, and it is still needed ?Code Status:  DNR ?Last date of multidisciplinary goals of care discussion 3/2 ? ?Mitzi Hansen, MD ?Internal Medicine Resident PGY-3 ?[facility] ?Pager: [phone] ?04/29/2021 11:52 AM  ?  ?

## 2021-04-30 DIAGNOSIS — N179 Acute kidney failure, unspecified: Secondary | ICD-10-CM | POA: Diagnosis not present

## 2021-04-30 LAB — RENAL FUNCTION PANEL
Albumin: 2.7 g/dL — ABNORMAL LOW (ref 3.5–5.0)
Albumin: 2.6 g/dL — ABNORMAL LOW (ref 3.5–5.0)
Anion gap: 8 (ref 5–15)
Anion gap: 8 (ref 5–15)
BUN: 22 mg/dL (ref 8–23)
BUN: 28 mg/dL — ABNORMAL HIGH (ref 8–23)
CO2: 28 mmol/L (ref 22–32)
CO2: 25 mmol/L (ref 22–32)
Calcium: 8.3 mg/dL — ABNORMAL LOW (ref 8.9–10.3)
Calcium: 8.1 mg/dL — ABNORMAL LOW (ref 8.9–10.3)
Chloride: 98 mmol/L (ref 98–111)
Chloride: 99 mmol/L (ref 98–111)
Creatinine, Ser: 3.94 mg/dL — ABNORMAL HIGH (ref 0.61–1.24)
Creatinine, Ser: 4.53 mg/dL — ABNORMAL HIGH (ref 0.61–1.24)
GFR, Estimated: 15 mL/min — ABNORMAL LOW (ref >60)
GFR, Estimated: 12 mL/min — ABNORMAL LOW (ref >60)
Glucose, Bld: 97 mg/dL (ref 70–99)
Glucose, Bld: 143 mg/dL — ABNORMAL HIGH (ref 70–99)
Phosphorus: 2.6 mg/dL (ref 2.5–4.6)
Phosphorus: 2.4 mg/dL — ABNORMAL LOW (ref 2.5–4.6)
Potassium: 4 mmol/L (ref 3.5–5.1)
Potassium: 4.2 mmol/L (ref 3.5–5.1)
Sodium: 134 mmol/L — ABNORMAL LOW (ref 135–145)
Sodium: 132 mmol/L — ABNORMAL LOW (ref 135–145)

## 2021-04-30 LAB — HEPATITIS B SURFACE ANTIBODY, QUANTITATIVE: Hep B S AB Quant (Post): <3.1 m[IU]/mL — ABNORMAL LOW (ref >9.9)

## 2021-04-30 LAB — GLUCOSE, CAPILLARY
Glucose-Capillary: 94 mg/dL (ref 70–99)
Glucose-Capillary: 98 mg/dL (ref 70–99)
Glucose-Capillary: 140 mg/dL — ABNORMAL HIGH (ref 70–99)
Glucose-Capillary: 106 mg/dL — ABNORMAL HIGH (ref 70–99)

## 2021-04-30 LAB — CBC
HCT: 29.7 % — ABNORMAL LOW (ref 39.0–52.0)
Hemoglobin: 10.2 g/dL — ABNORMAL LOW (ref 13.0–17.0)
MCH: 32 pg (ref 26.0–34.0)
MCHC: 34.3 g/dL (ref 30.0–36.0)
MCV: 93.1 fL (ref 80.0–100.0)
Platelets: 129 10*3/uL — ABNORMAL LOW (ref 150–400)
RBC: 3.19 MIL/uL — ABNORMAL LOW (ref 4.22–5.81)
RDW: 14.6 % (ref 11.5–15.5)
WBC: 6 10*3/uL (ref 4.0–10.5)
nRBC: 0 % (ref 0.0–0.2)

## 2021-04-30 LAB — MAGNESIUM: Magnesium: 2.1 mg/dL (ref 1.7–2.4)

## 2021-04-30 LAB — ANTINUCLEAR ANTIBODIES, IFA: ANA Ab, IFA: NEGATIVE

## 2021-04-30 MED ORDER — ADULT MULTIVITAMIN W/MINERALS CH
1.0000 | ORAL_TABLET | Freq: Every day | ORAL | Status: DC
  Administered 2021-04-30 – 2021-05-14 (×14): 1 via ORAL
  Filled 2021-04-30 (×14): qty 1

## 2021-04-30 NOTE — Progress Notes (Signed)
Admit: 04/23/2021 ?LOS: 7 ? ?79M AKI (unclear cause) with remote hx/o hematuria/proteinuria and volume overload req CRRT 2/26  04/28/21 ? ?Subjective:  ?First iHD yesterday: 3L UF ?Weights down 40lb ?BPs stable ?Remains anuric ?Serologies negative now including HIV, SPEP; ANA is pending ?Renal Bx postponed to 3/6 due to ASA use ? ?03/02 0701 - 03/03 0700 ?In: 271.8 [P.O.:120; I.V.:141.8] ?Out: 3117 [Urine:50] ? ?Filed Weights  ? 04/29/21 1200 04/29/21 1540 04/30/21 0430  ?Weight: 119.5 kg 116.5 kg 115.3 kg  ? ? ?Scheduled Meds: ? atropine  1 drop Right Eye QHS  ? brimonidine  1 drop Left Eye TID  ? Chlorhexidine Gluconate Cloth  6 each Topical Q0600  ? dorzolamide-timolol  1 drop Left Eye BID  ? feeding supplement  237 mL Oral TID BM  ? latanoprost  1 drop Left Eye QHS  ? mouth rinse  15 mL Mouth Rinse BID  ? pantoprazole  40 mg Oral Daily  ? pilocarpine  1 drop Left Eye TID  ? polyethylene glycol  17 g Oral Daily  ? prednisoLONE acetate  1 drop Right Eye QHS  ? sodium chloride flush  10-40 mL Intracatheter Q12H  ? triamcinolone ointment  1 application Topical Daily  ? ?Continuous Infusions: ? sodium chloride Stopped (04/28/21 1032)  ? ?PRN Meds:.sodium chloride, acetaminophen, bisacodyl, heparin, metoprolol tartrate, oxyCODONE, sodium chloride flush ? ?Current Labs: reviewed ? ?Physical Exam:  Blood pressure (!) 103/93, pulse 91, temperature 97.8 ?F (36.6 ?C), temperature source Oral, resp. rate 17, height 6\' 6"  (1.981 m), weight 115.3 kg, SpO2 100 %. ?NAD, conversant ?Blind ?RRR nl s1s2 ?CTAB ?Still with 3+ LEE, abdomen less protuberant ?Nonfocal ? ?A ?Dialysis dependent anuric AKI, unclear etiology,  ?nl GFR as of 04/2020.  ?Neg renal US at admission.    ?No UA but gross hematura on foley placement unclear if traumatic.   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  ?P ?As above ?Daily weights, Daily Renal Panel, Strict I/Os, Avoid nephrotoxins (NSAIDs, judicious IV Contrast)   ?Medication Issues; ?Preferred narcotic agents for pain control are hydromorphone, fentanyl, and methadone. Morphine should not be used.  ?Baclofen should be avoided ?Avoid oral sodium phosphate and magnesium citrate based laxatives / bowel preps  ? ? ?Pearson Grippe MD ?04/30/2021, 11:20 AM ? ?Recent Labs  ?Lab 04/29/21 ?0415 04/29/21 ?1829 04/30/21 ?1610  ?NA 134*  134* 134* 134*  ?K 4.7  4.7 3.8 4.0  ?CL 99  99 100 98  ?CO2 26  27 27 28   ?GLUCOSE 114*  111* 135* 97  ?BUN 31*  31* 19 22  ?CREATININE 4.96*  5.02* 3.61* 3.94*  ?CALCIUM 8.4*  8.4* 8.0* 8.3*  ?PHOS 2.7  2.7 2.1* 2.6  ? ? ?Recent Labs  ?Lab 04/23/21 ?1137 04/23/21 ?1329 04/25/21 ?0231 04/26/21 ?9604 04/27/21 ?0103 04/28/21 ?0105 04/29/21 ?5409 04/30/21 ?8119  ?WBC 6.6   < > 7.6 6.4   < > 5.0 5.9 6.0  ?NEUTROABS 5.3  --  6.6 5.1  --   --   --   --   ?HGB 12.7*   < > 12.9* 12.9*   < > 12.9* 10.7*  10.2*  ?HCT 38.5*   < > 37.0* 37.7*   < > 36.3* 31.9* 29.7*  ?MCV 98.0   < > 93.4 92.4   < > 91.9 94.4 93.1  ?PLT 143*   < > 125* 111*   < > 106* 125* 129*  ? < > = values in this interval not displayed.  ? ? ? ? ? ? ? ? ? ? ?  ?

## 2021-04-30 NOTE — Care Management Important Message (Signed)
Important Message ? ?Patient Details  ?Name: Austin Wheeler ?MRN: [id number] ?Date of Birth: 1940/07/07 ? ? ?Medicare Important Message Given:  Yes ? ? ? ? ?Shelda Altes ?04/30/2021, 12:05 PM ?

## 2021-04-30 NOTE — Progress Notes (Incomplete)
Admit: 04/23/2021 LOS: 7  27M AKI (unclear cause) with remote hx/o hematuria/proteinuria and volume overload req CRRT 2/26  04/28/21  Subjective:  First iHD yesterday: 3L UF BPs stable Remains anuric Serologies negative now including HIV, SPEP; ANA is pending Renal Bx postponed to 3/6 due to ASA use  03/02 0701 - 03/03 0700 In: 271.8 [P.O.:120; I.V.:141.8] Out: 3117 [Urine:50]  Filed Weights   04/29/21 1200 04/29/21 1540 04/30/21 0430  Weight: 119.5 kg 116.5 kg 115.3 kg    Scheduled Meds:  atropine  1 drop Right Eye QHS   brimonidine  1 drop Left Eye TID   Chlorhexidine Gluconate Cloth  6 each Topical Q0600   dorzolamide-timolol  1 drop Left Eye BID   feeding supplement  237 mL Oral TID BM   latanoprost  1 drop Left Eye QHS   mouth rinse  15 mL Mouth Rinse BID   pantoprazole  40 mg Oral Daily   pilocarpine  1 drop Left Eye TID   polyethylene glycol  17 g Oral Daily   prednisoLONE acetate  1 drop Right Eye QHS   sodium chloride flush  10-40 mL Intracatheter Q12H   triamcinolone ointment  1 application Topical Daily   Continuous Infusions:  sodium chloride Stopped (04/28/21 1032)   PRN Meds:.sodium chloride, acetaminophen, bisacodyl, heparin, metoprolol tartrate, oxyCODONE, sodium chloride flush  Current Labs: reviewed  Physical Exam:  Blood pressure (!) 103/93, pulse 91, temperature 97.8 F (36.6 C), temperature source Oral, resp. rate 17, height 6\' 6"  (1.981 m), weight 115.3 kg, SpO2 100 %. NAD, conversant Blind RRR nl s1s2 CTAB Still with 3+ LEE, abdomen less protuberant Nonfocal  A Dialysis dependent anuric AKI, unclear etiology,  nl GFR as of 04/2020.  Neg renal US at admission.    No UA but gross hematura on foley placement unclear if traumatic.   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 Morphine should not be used.  Baclofen should be avoided Avoid oral sodium phosphate and magnesium citrate based laxatives / bowel preps    Pearson Grippe MD 04/30/2021, 11:18 AM  Recent Labs  Lab 04/29/21 0415 04/29/21 1829 04/30/21 0437  NA 134*   134* 134* 134*  K 4.7   4.7 3.8 4.0  CL 99   99 100 98  CO2 26   27 27 28   GLUCOSE 114*   111* 135* 97  BUN 31*   31* 19 22  CREATININE 4.96*   5.02* 3.61* 3.94*  CALCIUM 8.4*   8.4* 8.0* 8.3*  PHOS 2.7   2.7 2.1* 2.6    Recent Labs  Lab 04/23/21 1137 04/23/21 1329 04/25/21 0231 04/26/21 0305 04/27/21 0103 04/28/21 0105 04/29/21 0415 04/30/21 0438  WBC 6.6   < > 7.6 6.4   < > 5.0 5.9 6.0  NEUTROABS 5.3  --  6.6 5.1  --   --   --   --   HGB 12.7*   < > 12.9* 12.9*   < >  12.9* 10.7* 10.2*  HCT 38.5*   < > 37.0* 37.7*   < > 36.3* 31.9* 29.7*  MCV 98.0   < > 93.4 92.4   < > 91.9 94.4 93.1  PLT 143*   < > 125* 111*   < > 106* 125* 129*   < > = values in this interval not displayed.

## 2021-04-30 NOTE — Plan of Care (Signed)
  Problem: Education: Goal: Knowledge of General Education information will improve Description Including pain rating scale, medication(s)/side effects and non-pharmacologic comfort measures Outcome: Progressing   Problem: Health Behavior/Discharge Planning: Goal: Ability to manage health-related needs will improve Outcome: Progressing   Problem: Clinical Measurements: Goal: Cardiovascular complication will be avoided Outcome: Progressing   Problem: Nutrition: Goal: Adequate nutrition will be maintained Outcome: Progressing   

## 2021-04-30 NOTE — Progress Notes (Signed)
   Inpatient Rehab Admissions Coordinator :  Per therapy recommendations patient was screened for CIR candidacy by Zeno Hickel RN MSN. Patient is not yet at a level to tolerate the intensity required to pursue a CIR admit . Patient may have the potential to progress to become a candidate. The CIR admissions team will follow and monitor for progress and place a Rehab Consult order if felt to be appropriate. Please contact me with any questions.  Ranesha Val RN MSN Admissions Coordinator [phone]  

## 2021-04-30 NOTE — Progress Notes (Signed)
Nutrition Follow-up ? ?DOCUMENTATION CODES:  ? ?Severe malnutrition in context of acute illness/injury ? ?INTERVENTION:  ?- Continue Ensure Enlive po TID, each supplement provides 350 kcal and 20 grams of protein. ? ?- MVI with minerals daily  ? ?- Encourage PO intake and oral nutrition supplements  ? ?NUTRITION DIAGNOSIS:  ? ?Severe Malnutrition related to acute illness (AKI) as evidenced by moderate fat depletion, moderate muscle depletion, severe muscle depletion. ? ?Ongoing  ? ?GOAL:  ? ?Patient will meet greater than or equal to 90% of their needs ? ?Progressing; being addressed via PO intake and oral nutrition supplements  ? ?MONITOR:  ? ?PO intake, Supplement acceptance, Labs, I & O's ? ?REASON FOR ASSESSMENT:  ? ?Rounds ?  ? ?ASSESSMENT:  ? ?81 yo male admitted with AKI. PMH includes HTN, DM, BPH, legally blind both eyes. ? ?2/26-3/1 - CRRT  ? ?Patient is currently on a dysphagia 3 diet and per meal documentation, pt consuming 10-50% of meals over the past 3 days.  ? ?Met with pt at bedside. Pt sitting up on side of bed and family member present during visit. Per pt, pt reports that he has a better appetite today and that he ate eggs, sausage, bacon and grits for breakfast today and reports eating most of this meal. Per pt, pt has been drinking the Strawberry Ensure and tolerates well. Pt's family member reports that pt has been drinking half of the Ensure. Dietetic intern encouraged pt to continue eating consistent meals throughout the day of which he can tolerate and drinking the Ensure between meals to aid in caloric and protein intake. Discussed MVI supplementation and will order MVI with minerals daily for pt. Dietetic intern to continue current nutrition plan along with MVI supplementation for vitamins and minerals.  ? ?Admit wt: 122.2 kg  ?Current wt: 115.3 kg  ?Pt came in fluid overload and suspect weight is down due to fluid loss.  ? ?Medications reviewed and include: Ensure TID, protonix,  miralax ? ?Labs reviewed and include: Sodium: 134 (low), Creatinine: 3.94 (high), Corrected calcium: 9.3 ? ? ? ?Diet Order:   ?Diet Order   ? ?       ?  DIET DYS 3 Room service appropriate? Yes; Fluid consistency: Thin  Diet effective now       ?  ? ?  ?  ? ?  ? ? ?EDUCATION NEEDS:  ? ?Not appropriate for education at this time ? ?Skin:  Skin Assessment: Reviewed RN Assessment (skin tear R elbow) ? ?Last BM:  3/2; type 6 ? ?Height:  ? ?Ht Readings from Last 1 Encounters:  ?04/23/21 '6\' 6"'  (1.981 m)  ? ? ?Weight:  ? ?Wt Readings from Last 1 Encounters:  ?04/30/21 115.3 kg  ? ? ?Ideal Body Weight:  97.3 kg ? ?BMI:  Body mass index is 29.37 kg/m?. ? ?Estimated Nutritional Needs:  ? ?Kcal:  2400 - 2600 ? ?Protein:  120 - 135 grams ? ?Fluid:  > 2.4 L ? ? ? ?Maryruth Hancock, Dietetic Intern ?04/30/2021 3:42 PM ?

## 2021-04-30 NOTE — Progress Notes (Signed)
OT Cancellation Note ? ?Patient Details ?Name: Austin Wheeler ?MRN: [id number] ?DOB: 1940/08/18 ? ? ?Cancelled Treatment:    Reason Eval/Treat Not Completed: Patient declined, no reason specified (reports fatigue and taking a nap) ? ?Jeri Modena ?04/30/2021, 11:34 AM ?

## 2021-04-30 NOTE — Evaluation (Signed)
Physical Therapy Evaluation ?Patient Details ?Name: Austin Wheeler ?MRN: [id number] ?DOB: 1940-05-10 ?Today's Date: 04/30/2021 ? ?History of Present Illness ? Patient is a 81 y/o male who presents on 04/23/21 with SOB, weakness and swelling abdomen/LEs. Found to have new onset A-fib, AKI injury of unknown origin started on HD, s/p paracentesis 2/24. Renal biopsy planned fof 05/03/21. PMH includes DM, HTN, legally blind.  ?Clinical Impression ? Patient presents with decreased AROM BUEs, pain, generalized weakness, impaired balance and impaired mobility s/p above. Pt lives at home with wife and reports being Mod I for ADLs and using RW for ambulation at baseline PTA. Today, pt requires Max A for bed mobility and initially Mod A for sitting balance progressing to MIn guard assist. Declined standing transfers as this is first time pt has been EOB in almost 1 week. Reports arthritic pain in BUEs/shoulders impacting mobility. Would benefit from post acute rehab to maximize independence and mobility prior to return home as pt is far from functional baseline. Will follow acutely.    ?   ? ?Recommendations for follow up therapy are one component of a multi-disciplinary discharge planning process, led by the attending physician.  Recommendations may be updated based on patient status, additional functional criteria and insurance authorization. ? ?Follow Up Recommendations Acute inpatient rehab (3hours/day) ? ?  ?Assistance Recommended at Discharge Frequent or constant Supervision/Assistance  ?Patient can return home with the following ? Two people to help with walking and/or transfers;Help with stairs or ramp for entrance;Assistance with cooking/housework;Two people to help with bathing/dressing/bathroom ? ?  ?Equipment Recommendations Wheelchair (measurements PT);Wheelchair cushion (measurements PT);BSC/3in1  ?Recommendations for Other Services ? Rehab consult  ?  ?Functional Status Assessment Patient has had a recent decline in  their functional status and demonstrates the ability to make significant improvements in function in a reasonable and predictable amount of time.  ? ?  ?Precautions / Restrictions Precautions ?Precautions: Fall;Other (comment) ?Precaution Comments: legally blind, A-fib ?Restrictions ?Weight Bearing Restrictions: No  ? ?  ? ?Mobility ? Bed Mobility ?Overal bed mobility: Needs Assistance ?Bed Mobility: Rolling, Sidelying to Sit ?Rolling: Mod assist ?Sidelying to sit: Max assist, HOB elevated ?  ?  ?  ?General bed mobility comments: Assist with LEs, trunk and scooting bottom to EOB, step by step cyes for sequencing and to reach for rail. ?  ? ?Transfers ?  ?  ?  ?  ?  ?  ?  ?  ?  ?General transfer comment: Pt deferred attempting. ?  ? ?Ambulation/Gait ?  ?  ?  ?  ?  ?  ?  ?  ? ?Stairs ?  ?  ?  ?  ?  ? ?Wheelchair Mobility ?  ? ?Modified Rankin (Stroke Patients Only) ?  ? ?  ? ?Balance Overall balance assessment: Needs assistance ?Sitting-balance support: Feet supported, No upper extremity supported ?Sitting balance-Leahy Scale: Fair ?Sitting balance - Comments: INitially Mod A progressing to Min guard-supervision assist. ?Postural control: Posterior lean ?  ?  ?  ?  ?  ?  ?  ?  ?  ?  ?  ?  ?  ?  ?   ? ? ? ?Pertinent Vitals/Pain Pain Assessment ?Pain Assessment: Faces ?Faces Pain Scale: Hurts even more ?Pain Location: BUEs at elbow/wrist, generalized ?Pain Descriptors / Indicators: Grimacing, Guarding, Discomfort, Sore, Aching ?Pain Intervention(s): Monitored during session, Repositioned, RN gave pain meds during session, Limited activity within patient's tolerance  ? ? ?Home Living Family/patient expects to be discharged  to:: Private residence ?Living Arrangements: Spouse/significant other ?Available Help at Discharge: Family;Available 24 hours/day ?Type of Home: House ?Home Access: Stairs to enter ?Entrance Stairs-Rails: Right ?Entrance Stairs-Number of Steps: 3 ?  ?Home Layout: One level ?Home Equipment: Chartered certified accountant (2 wheels);Rollator (4 wheels) ?   ?  ?Prior Function Prior Level of Function : Needs assist ?  ?  ?  ?  ?  ?  ?Mobility Comments: Uses RW for ambulation; no falls. ?ADLs Comments: Independent ?  ? ? ?Hand Dominance  ? Dominant Hand: Right ? ?  ?Extremity/Trunk Assessment  ? Upper Extremity Assessment ?Upper Extremity Assessment: Defer to OT evaluation (Limited AROM at shoulder and grip weakness bilaterally, testing limited due to pain. Able to grip toothbrush and brush teeth) ?  ? ?Lower Extremity Assessment ?Lower Extremity Assessment: Generalized weakness (Grossly ~2+/5-3/5 throughout.) ?  ? ?   ?Communication  ? Communication: No difficulties  ?Cognition Arousal/Alertness: Awake/alert ?Behavior During Therapy: [facility] for tasks assessed/performed ?Overall Cognitive Status: Within Functional Limits for tasks assessed ?  ?  ?  ?  ?  ?  ?  ?  ?  ?  ?  ?  ?  ?  ?  ?  ?General Comments: for basic mobility tasks. ?  ?  ? ?  ?General Comments General comments (skin integrity, edema, etc.): Wife present during session. Pt in A-fib. VSS otherwise. ? ?  ?Exercises    ? ?Assessment/Plan  ?  ?PT Assessment Patient needs continued PT services  ?PT Problem List Decreased strength;Decreased range of motion;Decreased mobility;Decreased coordination;Decreased balance;Decreased activity tolerance;Pain ? ?   ?  ?PT Treatment Interventions Therapeutic exercise;Patient/family education;Therapeutic activities;Functional mobility training;Balance training;DME instruction;Wheelchair mobility training;Gait training   ? ?PT Goals (Current goals can be found in the Care Plan section)  ?Acute Rehab PT Goals ?Patient Stated Goal: decrease pain, go to the bathroom ?PT Goal Formulation: With patient ?Time For Goal Achievement: 05/14/21 ?Potential to Achieve Goals: Fair ? ?  ?Frequency Min 3X/week ?  ? ? ?Co-evaluation   ?  ?  ?  ?  ? ? ?  ?AM-PAC PT "6 Clicks" Mobility  ?Outcome Measure Help needed turning from your back to your side while  in a flat bed without using bedrails?: A Lot ?Help needed moving from lying on your back to sitting on the side of a flat bed without using bedrails?: Total ?Help needed moving to and from a bed to a chair (including a wheelchair)?: Total ?Help needed standing up from a chair using your arms (e.g., wheelchair or bedside chair)?: Total ?Help needed to walk in hospital room?: Total ?Help needed climbing 3-5 steps with a railing? : Total ?6 Click Score: 7 ? ?  ?End of Session   ?Activity Tolerance: Patient tolerated treatment well;Patient limited by pain ?Patient left: in bed;with call bell/phone within reach;with family/visitor present (sitting EOB with pillows propped behind pt) ?Nurse Communication: Mobility status;Other (comment);Patient requests pain meds (pt sitting EOB) ?PT Visit Diagnosis: Pain;Muscle weakness (generalized) (M62.81);Difficulty in walking, not elsewhere classified (R26.2) ?Pain - Right/Left:  (bil) ?Pain - part of body: Shoulder;Arm;Hand ?  ? ?Time: 1191-4782 ?PT Time Calculation (min) (ACUTE ONLY): 43 min ? ? ?Charges:   PT Evaluation ?$PT Eval Moderate Complexity: 1 Mod ?PT Treatments ?$Therapeutic Activity: 23-37 mins ?  ?   ? ? ?Marisa Severin, PT, DPT ?Acute Rehabilitation Services ?Pager [phone] ?Office [phone] ? ? ? ? ?Uncertain ?04/30/2021, 9:23 AM ? ?

## 2021-04-30 NOTE — Progress Notes (Signed)
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   Patient started to have increasing bilateral lower extremities swelling about 3 months ago and was started of po Lasix 40 mg daily, swelling appears to be improving, patient however had episodes of feeling lightheadedness and "too much urination" and decided to cut down Lasix from 40 mg daily to 20 mg daily about 1 month ago. Condition remained stable since until about 1 week ago, patient started to notice swelling in his abdomen and legs, and decreased urine output.  Finally, for the past 3 days, there has been barely any urine came out. He said he feeling "bladder fullness" when having BM, but no urine came out. No abdominal pain, no back pain, no shortness of breath, no fever or chills. No Hx of CHF or stroke  2/24: Admitted to [facility] for acute on chronic renal failure, new onset a. Fib. Nephrology consulted; high dose Lasix trial initiated, large volume paracentesis of 8.3 L 2/26: PCCM consulted. Transfer to ICU for CRRT  2/27: tolerated CRRT 3/1 Heparin drip restarted 2/28 and patient has had recurrent hematuria and bleeding IV site. Heparin drip now on hold    3/3 TRH pickup   Consultants:  Pccm, nephrology , IR  Procedures:   Antimicrobials:      Subjective: Denies sob, cp. Sitting up in bed . No complaints  Objective: Vitals:   04/29/21 2043 04/30/21 0014 04/30/21 0430 04/30/21 0743  BP: 107/74 110/80 101/75 112/90  Pulse: 79 96 86 94  Resp: '15 19 15 14  ' Temp: 98.3 F (36.8 C) 98 F (36.7 C) 98.2 F (36.8 C)   TempSrc: Oral Oral Oral Oral  SpO2: 100% 98% 99% 100%  Weight:   115.3 kg   Height:        Intake/Output  Summary (Last 24 hours) at 04/30/2021 0829 Last data filed at 04/30/2021 0400 Gross per 24 hour  Intake 246.83 ml  Output 3117 ml  Net -2870.17 ml   Filed Weights   04/29/21 1200 04/29/21 1540 04/30/21 0430  Weight: 119.5 kg 116.5 kg 115.3 kg    Examination: Calm, NAD Cta no w/r Reg s1/s2 no gallop Soft benign +bs +edema b/l LE Awake and alert Mood and affect appropriate in current setting       Data Reviewed: I have personally reviewed following labs and imaging studies  CBC: Recent Labs  Lab 04/23/21 1137 04/23/21 1329 04/25/21 0231 04/26/21 0305 04/27/21 0103 04/28/21 0105 04/29/21 0415 04/30/21 0438  WBC 6.6   < > 7.6 6.4 5.7 5.0 5.9 6.0  NEUTROABS 5.3  --  6.6 5.1  --   --   --   --   HGB 12.7*   < > 12.9* 12.9* 13.3 12.9* 10.7* 10.2*  HCT 38.5*   < > 37.0* 37.7* 37.6* 36.3* 31.9* 29.7*  MCV 98.0   < > 93.4 92.4 91.3 91.9 94.4 93.1  PLT 143*   < > 125* 111* 110* 106* 125* 129*   < > = values in this interval not displayed.   Basic Metabolic Panel: Recent Labs  Lab 04/26/21 0305 04/26/21 1554 04/27/21 0103 04/27/21 1516 04/28/21 0105 04/28/21 1535 04/29/21 0415 04/29/21 1829 04/30/21 6073  04/30/21 0438  NA 134*   < > 135   < > 133* 134* 134*   134* 134* 134*  --   K 5.5*   < > 5.0   < > 4.6 4.6 4.7   4.7 3.8 4.0  --   CL 102   < > 102   < > 101 100 99   99 100 98  --   CO2 17*   < > 20*   < > '23 26 26   27 27 28  ' --   GLUCOSE 74   < > 68*   < > 115* 155* 114*   111* 135* 97  --   BUN 100*   < > 64*   < > 41* 30* 31*   31* 19 22  --   CREATININE 13.05*   < > 9.20*   < > 6.00* 4.59* 4.96*   5.02* 3.61* 3.94*  --   CALCIUM 9.0   < > 8.9   < > 8.4* 8.1* 8.4*   8.4* 8.0* 8.3*  --   MG 2.7*  --  2.7*  --  2.6*  --  2.5*  --   --  2.1  PHOS 5.1*   < > 3.4   < > 2.1* 2.6 2.7   2.7 2.1* 2.6  --    < > = values in this interval not displayed.   GFR: Estimated Creatinine Clearance: 21.7 mL/min (A) (by C-G formula based on SCr of 3.94 mg/dL (H)). Liver  Function Tests: Recent Labs  Lab 04/23/21 1137 04/24/21 0546 04/25/21 0231 04/25/21 1550 04/26/21 0305 04/26/21 1554 04/28/21 0105 04/28/21 1535 04/29/21 0415 04/29/21 1829 04/30/21 0437  AST 18  --  16  --  19  --   --   --   --   --   --   ALT 11  --  10  --  9  --   --   --   --   --   --   ALKPHOS 80  --  62  --  65  --   --   --   --   --   --   BILITOT 1.2  --  1.1  --  1.2  --   --   --   --   --   --   PROT 7.1  --  6.1*  --  6.4*  --   --   --   --   --   --   ALBUMIN 3.4*   < > 3.4*   < > 3.4*   < > 2.9* 2.8* 2.8*   2.7* 2.8* 2.7*   < > = values in this interval not displayed.   No results for input(s): LIPASE, AMYLASE in the last 168 hours. No results for input(s): AMMONIA in the last 168 hours. Coagulation Profile: Recent Labs  Lab 04/23/21 2021 04/27/21 0103 04/29/21 0415  INR 1.6* 1.5* 1.4*   Cardiac Enzymes: Recent Labs  Lab 04/27/21 1408  CKTOTAL 70   BNP (last 3 results) No results for input(s): PROBNP in the last 8760 hours. HbA1C: No results for input(s): HGBA1C in the last 72 hours. CBG: Recent Labs  Lab 04/29/21 0750 04/29/21 1147 04/29/21 1634 04/29/21 2150 04/30/21 0614  GLUCAP 93 147* 84 106* 94   Lipid Profile: No results for input(s): CHOL, HDL, LDLCALC, TRIG, CHOLHDL, LDLDIRECT in the last 72 hours. Thyroid Function Tests: No results for input(s):  TSH, T4TOTAL, FREET4, T3FREE, THYROIDAB in the last 72 hours. Anemia Panel: No results for input(s): VITAMINB12, FOLATE, FERRITIN, TIBC, IRON, RETICCTPCT in the last 72 hours. Sepsis Labs: Recent Labs  Lab 04/23/21 1338 04/23/21 1647  LATICACIDVEN 3.8* 4.3*    Recent Results (from the past 240 hour(s))  Resp Panel by RT-PCR (Flu A&B, Covid) Nasopharyngeal Swab     Status: None   Collection Time: 04/23/21 12:23 PM   Specimen: Nasopharyngeal Swab; Nasopharyngeal(NP) swabs in vial transport medium  Result Value Ref Range Status   SARS Coronavirus 2 by RT PCR NEGATIVE NEGATIVE  Final    Comment: (NOTE) SARS-CoV-2 target nucleic acids are NOT DETECTED.  The SARS-CoV-2 RNA is generally detectable in upper respiratory specimens during the acute phase of infection. The lowest concentration of SARS-CoV-2 viral copies this assay can detect is 138 copies/mL. A negative result does not preclude SARS-Cov-2 infection and should not be used as the sole basis for treatment or other patient management decisions. A negative result may occur with  improper specimen collection/handling, submission of specimen other than nasopharyngeal swab, presence of viral mutation(s) within the areas targeted by this assay, and inadequate number of viral copies(<138 copies/mL). A negative result must be combined with clinical observations, patient history, and epidemiological information. The expected result is Negative.  Fact Sheet for Patients:  EntrepreneurPulse.com.au  Fact Sheet for Healthcare Providers:  IncredibleEmployment.be  This test is no t yet approved or cleared by the [place] FDA and  has been authorized for detection and/or diagnosis of SARS-CoV-2 by FDA under an Emergency Use Authorization (EUA). This EUA will remain  in effect (meaning this test can be used) for the duration of the COVID-19 declaration under Section 564(b)(1) of the Act, 21 U.S.C.section 360bbb-3(b)(1), unless the authorization is terminated  or revoked sooner.       Influenza A by PCR NEGATIVE NEGATIVE Final   Influenza B by PCR NEGATIVE NEGATIVE Final    Comment: (NOTE) The Xpert Xpress SARS-CoV-2/FLU/RSV plus assay is intended as an aid in the diagnosis of influenza from Nasopharyngeal swab specimens and should not be used as a sole basis for treatment. Nasal washings and aspirates are unacceptable for Xpert Xpress SARS-CoV-2/FLU/RSV testing.  Fact Sheet for Patients: EntrepreneurPulse.com.au  Fact Sheet for Healthcare  Providers: IncredibleEmployment.be  This test is not yet approved or cleared by the [place] FDA and has been authorized for detection and/or diagnosis of SARS-CoV-2 by FDA under an Emergency Use Authorization (EUA). This EUA will remain in effect (meaning this test can be used) for the duration of the COVID-19 declaration under Section 564(b)(1) of the Act, 21 U.S.C. section 360bbb-3(b)(1), unless the authorization is terminated or revoked.  Performed at [facility], [place] [address], [place], [place] [address]   MRSA Next Gen by PCR, Nasal     Status: None   Collection Time: 04/25/21 11:41 AM   Specimen: Nasal Mucosa; Nasal Swab  Result Value Ref Range Status   MRSA by PCR Next Gen NOT DETECTED NOT DETECTED Final    Comment: (NOTE) The GeneXpert MRSA Assay (FDA approved for NASAL specimens only), is one component of a comprehensive MRSA colonization surveillance program. It is not intended to diagnose MRSA infection nor to guide or monitor treatment for MRSA infections. Test performance is not FDA approved in patients less than 32 years old. Performed at [facility], [place] [address] [place], [place] [address]          Radiology  Studies: No results found.      Scheduled Meds:  atropine  1 drop Right Eye QHS   brimonidine  1 drop Left Eye TID   Chlorhexidine Gluconate Cloth  6 each Topical Q0600   dorzolamide-timolol  1 drop Left Eye BID   feeding supplement  237 mL Oral TID BM   latanoprost  1 drop Left Eye QHS   mouth rinse  15 mL Mouth Rinse BID   pantoprazole  40 mg Oral Daily   pilocarpine  1 drop Left Eye TID   polyethylene glycol  17 g Oral Daily   prednisoLONE acetate  1 drop Right Eye QHS   sodium chloride flush  10-40 mL Intracatheter Q12H   triamcinolone ointment  1 application Topical Daily   Continuous Infusions:  sodium chloride Stopped (04/28/21 1032)    Assessment & Plan:   Principal Problem:   AKI  (acute kidney injury) (Hurt) Active Problems:   Pressure injury of skin   Protein-calorie malnutrition, severe   Acute Kidney Failure with Anuria requiring RRT Anasarca -Patient with acute kidney injury of unclear origin. Differential includes ATN versus nephrotic syndrome versus acute GN given hematuria. -serologies for acute GN thus far negative, hep b/c negative, ANA, unable thus far to obtain urine sample for multiple myeloma work up 3/3 (temp HD line placed) Negative renal US Failed diuresis, started on CRRT 2/26-3/1, HD#1 04/29/21 Nephrology following Renal bx postponed to 3/6 due to ASA use.     New onset atrial fibrillation Rate control with metoprolol.   -CHADVASC score of 4 (age, HTN, DM), initially holding anticoagulation in setting of gross hematuria.  3/3 had bleeding with heparin drip.  Holding it. 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 3/3continue home meds  Thrombocytopenia.  Platelets improving slowly Continue to monitor    Type 2 DM with Hypoglycemia. Hypoglycemia resolved. CBGs within goal.  3/3 continue to monitor, add R-ISS when appropriate   Hyperkalemia Resolved with CRRT  Metabolic acidosis  Resolved, CTM    Please note:  Medication Issues; Preferred narcotic agents for pain control are hydromorphone, fentanyl, and methadone. Morphine should not be used.  Baclofen should be avoided Avoid oral sodium phosphate and magnesium citrate based laxatives / bowel preps     DVT prophylaxis: scd Code Status: DNR Family Communication: Wife at bedside Disposition Plan:  Status is: Inpatient Remains inpatient appropriate because: IV treatment, needs renal biopsy, needs transition to dialysis                LOS: 7 days   Time spent: 45 minutes with more than 50% on [place], MD [facility] Pager [phone]  [phone]  If 7PM-7AM, please contact night-coverage 04/30/2021, 8:29 AM

## 2021-05-01 DIAGNOSIS — N179 Acute kidney failure, unspecified: Secondary | ICD-10-CM | POA: Diagnosis not present

## 2021-05-01 LAB — CBC
HCT: 28.8 % — ABNORMAL LOW (ref 39.0–52.0)
Hemoglobin: 10 g/dL — ABNORMAL LOW (ref 13.0–17.0)
MCH: 32.9 pg (ref 26.0–34.0)
MCHC: 34.7 g/dL (ref 30.0–36.0)
MCV: 94.7 fL (ref 80.0–100.0)
Platelets: 142 10*3/uL — ABNORMAL LOW (ref 150–400)
RBC: 3.04 MIL/uL — ABNORMAL LOW (ref 4.22–5.81)
RDW: 14.6 % (ref 11.5–15.5)
WBC: 6 10*3/uL (ref 4.0–10.5)
nRBC: 0 % (ref 0.0–0.2)

## 2021-05-01 LAB — RENAL FUNCTION PANEL
Albumin: 2.7 g/dL — ABNORMAL LOW (ref 3.5–5.0)
Albumin: 2.7 g/dL — ABNORMAL LOW (ref 3.5–5.0)
Anion gap: 10 (ref 5–15)
Anion gap: 8 (ref 5–15)
BUN: 29 mg/dL — ABNORMAL HIGH (ref 8–23)
BUN: 18 mg/dL (ref 8–23)
CO2: 26 mmol/L (ref 22–32)
CO2: 28 mmol/L (ref 22–32)
Calcium: 8.4 mg/dL — ABNORMAL LOW (ref 8.9–10.3)
Calcium: 8.2 mg/dL — ABNORMAL LOW (ref 8.9–10.3)
Chloride: 97 mmol/L — ABNORMAL LOW (ref 98–111)
Chloride: 98 mmol/L (ref 98–111)
Creatinine, Ser: 4.86 mg/dL — ABNORMAL HIGH (ref 0.61–1.24)
Creatinine, Ser: 3.32 mg/dL — ABNORMAL HIGH (ref 0.61–1.24)
GFR, Estimated: 11 mL/min — ABNORMAL LOW (ref >60)
GFR, Estimated: 18 mL/min — ABNORMAL LOW (ref >60)
Glucose, Bld: 98 mg/dL (ref 70–99)
Glucose, Bld: 123 mg/dL — ABNORMAL HIGH (ref 70–99)
Phosphorus: 2.8 mg/dL (ref 2.5–4.6)
Phosphorus: 2 mg/dL — ABNORMAL LOW (ref 2.5–4.6)
Potassium: 4.3 mmol/L (ref 3.5–5.1)
Potassium: 3.7 mmol/L (ref 3.5–5.1)
Sodium: 133 mmol/L — ABNORMAL LOW (ref 135–145)
Sodium: 134 mmol/L — ABNORMAL LOW (ref 135–145)

## 2021-05-01 LAB — GLUCOSE, CAPILLARY
Glucose-Capillary: 90 mg/dL (ref 70–99)
Glucose-Capillary: 130 mg/dL — ABNORMAL HIGH (ref 70–99)
Glucose-Capillary: 118 mg/dL — ABNORMAL HIGH (ref 70–99)

## 2021-05-01 LAB — MAGNESIUM: Magnesium: 2.2 mg/dL (ref 1.7–2.4)

## 2021-05-01 MED ORDER — MIDODRINE HCL 5 MG PO TABS
10.0000 mg | ORAL_TABLET | Freq: Once | ORAL | Status: AC
  Administered 2021-05-01: 10 mg via ORAL
  Filled 2021-05-01: qty 2

## 2021-05-01 MED ORDER — HEPARIN SODIUM (PORCINE) 1000 UNIT/ML IJ SOLN
INTRAMUSCULAR | Status: AC
Start: 2021-05-01 — End: 2021-05-01
  Administered 2021-05-01: 2400 [IU]
  Filled 2021-05-01: qty 3

## 2021-05-01 MED ORDER — HEPARIN SODIUM (PORCINE) 1000 UNIT/ML IJ SOLN
1000.0000 [IU] | INTRAMUSCULAR | Status: DC | PRN
  Filled 2021-05-01: qty 1

## 2021-05-01 NOTE — Procedures (Signed)
I was present at this dialysis session. I have reviewed the session itself and made appropriate changes.  ? ?Goal UF 4L. 2K bath.  Struggling with soft BPs, UF as able.  He is w/o complaint. Remains 3-4+ LEE in distal legs.   ? ?Filed Weights  ? 04/29/21 1540 04/30/21 0430 05/01/21 0649  ?Weight: 116.5 kg 115.3 kg 116.5 kg  ? ? ?Recent Labs  ?Lab 05/01/21 ?0330  ?NA 133*  ?K 4.3  ?CL 97*  ?CO2 26  ?GLUCOSE 98  ?BUN 29*  ?CREATININE 4.86*  ?CALCIUM 8.4*  ?PHOS 2.8  ? ? ?Recent Labs  ?Lab 04/25/21 ?0231 04/26/21 ?0305 04/27/21 ?0103 04/29/21 ?3086 04/30/21 ?5784 05/01/21 ?0330  ?WBC 7.6 6.4   < > 5.9 6.0 6.0  ?NEUTROABS 6.6 5.1  --   --   --   --   ?HGB 12.9* 12.9*   < > 10.7* 10.2* 10.0*  ?HCT 37.0* 37.7*   < > 31.9* 29.7* 28.8*  ?MCV 93.4 92.4   < > 94.4 93.1 94.7  ?PLT 125* 111*   < > 125* 129* 142*  ? < > = values in this interval not displayed.  ? ? ?Scheduled Meds: ? atropine  1 drop Right Eye QHS  ? brimonidine  1 drop Left Eye TID  ? Chlorhexidine Gluconate Cloth  6 each Topical Q0600  ? dorzolamide-timolol  1 drop Left Eye BID  ? feeding supplement  237 mL Oral TID BM  ? latanoprost  1 drop Left Eye QHS  ? mouth rinse  15 mL Mouth Rinse BID  ? multivitamin with minerals  1 tablet Oral Daily  ? pantoprazole  40 mg Oral Daily  ? pilocarpine  1 drop Left Eye TID  ? polyethylene glycol  17 g Oral Daily  ? prednisoLONE acetate  1 drop Right Eye QHS  ? sodium chloride flush  10-40 mL Intracatheter Q12H  ? triamcinolone ointment  1 application Topical Daily  ? ?Continuous Infusions: ? sodium chloride Stopped (04/28/21 1032)  ? ?PRN Meds:.sodium chloride, acetaminophen, bisacodyl, heparin, metoprolol tartrate, oxyCODONE, sodium chloride flush   ?Pearson Grippe  MD ?05/01/2021, 8:00 AM ?  ?

## 2021-05-01 NOTE — Evaluation (Signed)
Occupational Therapy Evaluation Patient Details Name: Austin Wheeler MRN: [id number] DOB: Jul 07, 1940 Today's Date: 05/01/2021   History of Present Illness Patient is a 81 y/o male who presents on 04/23/21 with SOB, weakness and swelling abdomen/LEs. Found to have new onset A-fib, AKI injury of unknown origin started on HD, s/p paracentesis 2/24. Renal biopsy planned fof 05/03/21. PMH includes DM, HTN, legally blind.   Clinical Impression   Patient admitted for the diagnosis above.  PTA he needed assist with med, IADLs and community mobility due to vision deficits, but he could bathe and dress himself without assist.  In addition he used a RW for mobility.  Deficits impacting independence are listed below, but pain is the prevailing deficit.  Currently he is needing Max A for basic bed mobility, is unable to stand, and is needing near total dependence for lower body ADL at bedlevel.  OT will follow in the acute setting, and AIR has been recommended for post acute rehab.  The spouse can provide supportive care, but cannot physically assist him.  Acute rehab to try and get him to a point where he could tolerate AIR.       Recommendations for follow up therapy are one component of a multi-disciplinary discharge planning process, led by the attending physician.  Recommendations may be updated based on patient status, additional functional criteria and insurance authorization.   Follow Up Recommendations  Acute inpatient rehab (3hours/day)    Assistance Recommended at Discharge Frequent or constant Supervision/Assistance  Patient can return home with the following Two people to help with walking and/or transfers;Two people to help with bathing/dressing/bathroom;Assistance with cooking/housework;Help with stairs or ramp for entrance;Assist for transportation;Direct supervision/assist for financial management;Direct supervision/assist for medications management    Functional Status Assessment  Patient has  had a recent decline in their functional status and demonstrates the ability to make significant improvements in function in a reasonable and predictable amount of time.  Equipment Recommendations  Wheelchair (measurements OT);Wheelchair cushion (measurements OT);Tub/shower seat;BSC/3in1;Hospital bed    Recommendations for Other Services       Precautions / Restrictions Precautions Precautions: Fall Restrictions Weight Bearing Restrictions: No Other Position/Activity Restrictions: R hip pain with bruise noted - RN notified      Mobility Bed Mobility Overal bed mobility: Needs Assistance Bed Mobility: Supine to Sit, Sit to Supine     Supine to sit: Max assist, HOB elevated Sit to supine: Max assist, +2 for physical assistance        Transfers                   General transfer comment: unable to stand despite raising bed 8" from lowest setting.  Unable to clear bottom from mattress.      Balance Overall balance assessment: Needs assistance Sitting-balance support: Bilateral upper extremity supported, Feet supported Sitting balance-Leahy Scale: Fair                                     ADL either performed or assessed with clinical judgement   ADL Overall ADL's : Needs assistance/impaired Eating/Feeding: Moderate assistance;Bed level   Grooming: Wash/dry hands;Wash/dry face;Oral care;Sitting;Minimal assistance   Upper Body Bathing: Moderate assistance;Bed level   Lower Body Bathing: Bed level;Total assistance   Upper Body Dressing : Maximal assistance;Sitting   Lower Body Dressing: Total assistance;Bed level       Toileting- Clothing Manipulation and Hygiene: Total assistance;Bed level  Vision Baseline Vision/History: 3 Glaucoma;2 Legally blind Patient Visual Report: No change from baseline Additional Comments: no distinction of light     Perception Perception Perception: Within Functional Limits   Praxis  Praxis Praxis: Intact    Pertinent Vitals/Pain Pain Assessment Faces Pain Scale: Hurts even more Pain Location: BUEs at elbow/wrist, R hip Pain Descriptors / Indicators: Grimacing, Guarding, Discomfort, Sore, Aching Pain Intervention(s): Monitored during session     Hand Dominance Right   Extremity/Trunk Assessment Upper Extremity Assessment Upper Extremity Assessment: Generalized weakness;RUE deficits/detail;LUE deficits/detail RUE Deficits / Details: pain to all pivots and increased edema to elbow RUE: Shoulder pain with ROM RUE Sensation: WNL LUE Deficits / Details: pain to all pivots and increased edema to elbow LUE: Shoulder pain with ROM LUE Sensation: WNL   Lower Extremity Assessment Lower Extremity Assessment: Defer to PT evaluation   Cervical / Trunk Assessment Cervical / Trunk Assessment: Kyphotic   Communication Communication Communication: No difficulties   Cognition Arousal/Alertness: Awake/alert Behavior During Therapy: WFL for tasks assessed/performed Overall Cognitive Status: Within Functional Limits for tasks assessed                                       General Comments   Bruise and pain to R hip - RN notified.    Exercises General Exercises - Upper Extremity Shoulder Flexion: AAROM, 5 reps, Supine, Both Shoulder ABduction: AAROM, 5 reps, Both, Supine Elbow Flexion: AROM, Seated, Both Elbow Extension: AROM, Seated Wrist Flexion: Seated, Both Wrist Extension: AROM, Seated, Both Digit Composite Flexion: AROM, Seated, Both Composite Extension: AROM, Seated, Both   Shoulder Instructions      Home Living Family/patient expects to be discharged to:: Private residence Living Arrangements: Spouse/significant other Available Help at Discharge: Family;Available 24 hours/day Type of Home: House Home Access: Stairs to enter [employer] of Steps: 3 Entrance Stairs-Rails: Right Home Layout: One level     Bathroom  Shower/Tub: Occupational psychologist: Standard     Home Equipment: Conservation officer, nature (2 wheels);Rollator (4 wheels)          Prior Functioning/Environment Prior Level of Function : Needs assist             Mobility Comments: Uses RW for ambulation; no falls. ADLs Comments: Independent.  Wife assists with community mobility, meds, IADLs and home management.        OT Problem List: Decreased strength;Decreased range of motion;Decreased activity tolerance;Impaired balance (sitting and/or standing);Decreased knowledge of use of DME or AE;Increased edema;Pain      OT Treatment/Interventions: Self-care/ADL training;Therapeutic exercise;Therapeutic activities;DME and/or AE instruction;Patient/family education;Balance training    OT Goals(Current goals can be found in the care plan section) Acute Rehab OT Goals Patient Stated Goal: Be able to stand OT Goal Formulation: With patient Time For Goal Achievement: 05/14/21 Potential to Achieve Goals: Fair ADL Goals Pt Will Perform Grooming: with set-up;sitting Pt Will Perform Upper Body Bathing: with set-up;sitting Pt Will Perform Upper Body Dressing: with set-up;sitting Pt Will Transfer to Toilet: with mod assist;stand pivot transfer;bedside commode Pt/caregiver will Perform Home Exercise Program: Increased strength;Both right and left upper extremity;With minimal assist;With written HEP provided  OT Frequency: Min 2X/week    Co-evaluation              AM-PAC OT "6 Clicks" Daily Activity     Outcome Measure Help from another person eating meals?: A Little Help from another  person taking care of personal grooming?: A Little Help from another person toileting, which includes using toliet, bedpan, or urinal?: A Lot Help from another person bathing (including washing, rinsing, drying)?: A Lot Help from another person to put on and taking off regular upper body clothing?: A Lot Help from another person to put on and taking  off regular lower body clothing?: Total 6 Click Score: 13   End of Session Equipment Utilized During Treatment: Gait belt Nurse Communication: Mobility status  Activity Tolerance: Patient tolerated treatment well Patient left: in bed;with call bell/phone within reach;with family/visitor present  OT Visit Diagnosis: Unsteadiness on feet (R26.81);Muscle weakness (generalized) (M62.81);Pain Pain - part of body: Shoulder;Arm;Hand;Hip;Knee;Ankle and joints of foot                Time: 1550-1630 OT Time Calculation (min): 40 min Charges:  OT General Charges $OT Visit: 1 Visit OT Evaluation $OT Eval Moderate Complexity: 1 Mod OT Treatments $Self Care/Home Management : 23-37 mins  05/01/2021  RP, OTR/L  Acute Rehabilitation Services  Office:  [phone]   Metta Clines 05/01/2021, 4:49 PM

## 2021-05-01 NOTE — Progress Notes (Signed)
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   Patient started to have increasing bilateral lower extremities swelling about 3 months ago and was started of po Lasix 40 mg daily, swelling appears to be improving, patient however had episodes of feeling lightheadedness and "too much urination" and decided to cut down Lasix from 40 mg daily to 20 mg daily about 1 month ago. Condition remained stable since until about 1 week ago, patient started to notice swelling in his abdomen and legs, and decreased urine output.  Finally, for the past 3 days, there has been barely any urine came out. He said he feeling "bladder fullness" when having BM, but no urine came out. No abdominal pain, no back pain, no shortness of breath, no fever or chills. No Hx of CHF or stroke  2/24: Admitted to [facility] for acute on chronic renal failure, new onset a. Fib. Nephrology consulted; high dose Lasix trial initiated, large volume paracentesis of 8.3 L 2/26: PCCM consulted. Transfer to ICU for CRRT  2/27: tolerated CRRT 3/1 Heparin drip restarted 2/28 and patient has had recurrent hematuria and bleeding IV site. Heparin drip now on hold    3/3 TRH pickup  3/4 seen in dialysis has no complaints  Consultants:  Pccm, nephrology , IR  Procedures:   Antimicrobials:      Subjective: Denies chest pain or shortness of breath.  Objective: Vitals:   05/01/21 0700 05/01/21 0730 05/01/21 0800 05/01/21 0830  BP: 111/67 92/60 99/70 102/74  Pulse: 90 (!) 113 97 (!) 101  Resp:  18 14   Temp:      TempSrc:      SpO2:      Weight:      Height:        Intake/Output Summary (Last 24 hours) at 05/01/2021 0902 Last data  filed at 05/01/2021 0328 Gross per 24 hour  Intake 540 ml  Output 175 ml  Net 365 ml   Filed Weights   04/29/21 1540 04/30/21 0430 05/01/21 0649  Weight: 116.5 kg 115.3 kg 116.5 kg    Examination: Calm, NAD Cta no w/r Reg s1/s2 no gallop Soft benign +bs distended anasarca +pitting edema b/l UE with edema Aaoxox3  Mood and affect appropriate in current setting       Data Reviewed: I have personally reviewed following labs and imaging studies  CBC: Recent Labs  Lab 04/25/21 0231 04/26/21 0305 04/27/21 0103 04/28/21 0105 04/29/21 0415 04/30/21 0438 05/01/21 0330  WBC 7.6 6.4 5.7 5.0 5.9 6.0 6.0  NEUTROABS 6.6 5.1  --   --   --   --   --   HGB 12.9* 12.9* 13.3 12.9* 10.7* 10.2* 10.0*  HCT 37.0* 37.7* 37.6* 36.3* 31.9* 29.7* 28.8*  MCV 93.4 92.4 91.3 91.9 94.4 93.1 94.7  PLT 125* 111* 110* 106* 125* 129* 449*   Basic Metabolic Panel: Recent Labs  Lab 04/27/21 0103 04/27/21 1516 04/28/21 0105 04/28/21 1535 04/29/21 0415 04/29/21 1829 04/30/21 0437 04/30/21 0438 04/30/21 1630 05/01/21 0330  NA 135   < > 133*   < > 134*   134* 134* 134*  --  132* 133*  K 5.0   < > 4.6   < >  4.7   4.7 3.8 4.0  --  4.2 4.3  CL 102   < > 101   < > 99   99 100 98  --  99 97*  CO2 20*   < > 23   < > _0 --  25 26  GLUCOSE 68*   < > 115*   < > 114*   111* 135* 97  --  143* 98  BUN 64*   < > 41*   < > 31*   31* 19 22  --  28* 29*  CREATININE 9.20*   < > 6.00*   < > 4.96*   5.02* 3.61* 3.94*  --  4.53* 4.86*  CALCIUM 8.9   < > 8.4*   < > 8.4*   8.4* 8.0* 8.3*  --  8.1* 8.4*  MG 2.7*  --  2.6*  --  2.5*  --   --  2.1  --  2.2  PHOS 3.4   < > 2.1*   < > 2.7   2.7 2.1* 2.6  --  2.4* 2.8   < > = values in this interval not displayed.   GFR: Estimated Creatinine Clearance: 17.7 mL/min (A) (by C-G formula based on SCr of 4.86 mg/dL (H)). Liver Function Tests: Recent Labs  Lab 04/25/21 0231 04/25/21 1550 04/26/21 0305 04/26/21 1554 04/29/21 0415 04/29/21 1829  04/30/21 0437 04/30/21 1630 05/01/21 0330  AST 16  --  19  --   --   --   --   --   --   ALT 10  --  9  --   --   --   --   --   --   ALKPHOS 62  --  65  --   --   --   --   --   --   BILITOT 1.1  --  1.2  --   --   --   --   --   --   PROT 6.1*  --  6.4*  --   --   --   --   --   --   ALBUMIN 3.4*   < > 3.4*   < > 2.8*   2.7* 2.8* 2.7* 2.6* 2.7*   < > = values in this interval not displayed.   No results for input(s): LIPASE, AMYLASE in the last 168 hours. No results for input(s): AMMONIA in the last 168 hours. Coagulation Profile: Recent Labs  Lab 04/27/21 0103 04/29/21 0415  INR 1.5* 1.4*   Cardiac Enzymes: Recent Labs  Lab 04/27/21 1408  CKTOTAL 70   BNP (last 3 results) No results for input(s): PROBNP in the last 8760 hours. HbA1C: No results for input(s): HGBA1C in the last 72 hours. CBG: Recent Labs  Lab 04/30/21 0614 04/30/21 1057 04/30/21 1606 04/30/21 2128 05/01/21 0611  GLUCAP 94 98 140* 106* 90   Lipid Profile: No results for input(s): CHOL, HDL, LDLCALC, TRIG, CHOLHDL, LDLDIRECT in the last 72 hours. Thyroid Function Tests: No results for input(s): TSH, T4TOTAL, FREET4, T3FREE, THYROIDAB in the last 72 hours. Anemia Panel: No results for input(s): VITAMINB12, FOLATE, FERRITIN, TIBC, IRON, RETICCTPCT in the last 72 hours. Sepsis Labs: No results for input(s): PROCALCITON, LATICACIDVEN in the last 168 hours.   Recent Results (from the past 240 hour(s))  Resp Panel by RT-PCR (Flu A&B, Covid) Nasopharyngeal Swab     Status: None   Collection Time:  04/23/21 12:23 PM   Specimen: Nasopharyngeal Swab; Nasopharyngeal(NP) swabs in vial transport medium  Result Value Ref Range Status   SARS Coronavirus 2 by RT PCR NEGATIVE NEGATIVE Final    Comment: (NOTE) SARS-CoV-2 target nucleic acids are NOT DETECTED.  The SARS-CoV-2 RNA is generally detectable in upper respiratory specimens during the acute phase of infection. The lowest concentration of SARS-CoV-2  viral copies this assay can detect is 138 copies/mL. A negative result does not preclude SARS-Cov-2 infection and should not be used as the sole basis for treatment or other patient management decisions. A negative result may occur with  improper specimen collection/handling, submission of specimen other than nasopharyngeal swab, presence of viral mutation(s) within the areas targeted by this assay, and inadequate number of viral copies(<138 copies/mL). A negative result must be combined with clinical observations, patient history, and epidemiological information. The expected result is Negative.  Fact Sheet for Patients:  EntrepreneurPulse.com.au  Fact Sheet for Healthcare Providers:  IncredibleEmployment.be  This test is no t yet approved or cleared by the [place] FDA and  has been authorized for detection and/or diagnosis of SARS-CoV-2 by FDA under an Emergency Use Authorization (EUA). This EUA will remain  in effect (meaning this test can be used) for the duration of the COVID-19 declaration under Section 564(b)(1) of the Act, 21 U.S.C.section 360bbb-3(b)(1), unless the authorization is terminated  or revoked sooner.       Influenza A by PCR NEGATIVE NEGATIVE Final   Influenza B by PCR NEGATIVE NEGATIVE Final    Comment: (NOTE) The Xpert Xpress SARS-CoV-2/FLU/RSV plus assay is intended as an aid in the diagnosis of influenza from Nasopharyngeal swab specimens and should not be used as a sole basis for treatment. Nasal washings and aspirates are unacceptable for Xpert Xpress SARS-CoV-2/FLU/RSV testing.  Fact Sheet for Patients: EntrepreneurPulse.com.au  Fact Sheet for Healthcare Providers: IncredibleEmployment.be  This test is not yet approved or cleared by the [place] FDA and has been authorized for detection and/or diagnosis of SARS-CoV-2 by FDA under an Emergency Use Authorization  (EUA). This EUA will remain in effect (meaning this test can be used) for the duration of the COVID-19 declaration under Section 564(b)(1) of the Act, 21 U.S.C. section 360bbb-3(b)(1), unless the authorization is terminated or revoked.  Performed at [facility], [place] [address] [place], [place] [address]   MRSA Next Gen by PCR, Nasal     Status: None   Collection Time: 04/25/21 11:41 AM   Specimen: Nasal Mucosa; Nasal Swab  Result Value Ref Range Status   MRSA by PCR Next Gen NOT DETECTED NOT DETECTED Final    Comment: (NOTE) The GeneXpert MRSA Assay (FDA approved for NASAL specimens only), is one component of a comprehensive MRSA colonization surveillance program. It is not intended to diagnose MRSA infection nor to guide or monitor treatment for MRSA infections. Test performance is not FDA approved in patients less than 43 years old. Performed at [facility], [place] [address] [place], [place] [address]          Radiology Studies: No results found.      Scheduled Meds:  atropine  1 drop Right Eye QHS   brimonidine  1 drop Left Eye TID   Chlorhexidine Gluconate Cloth  6 each Topical Q0600   dorzolamide-timolol  1 drop Left Eye BID   feeding supplement  237 mL Oral TID BM   latanoprost  1 drop Left Eye QHS   mouth rinse  15 mL Mouth Rinse BID  ° multivitamin with minerals  1 tablet Oral Daily  ° pantoprazole  40 mg Oral Daily  ° pilocarpine  1 drop Left Eye TID  ° polyethylene glycol  17 g Oral Daily  ° prednisoLONE acetate  1 drop Right Eye QHS  ° sodium chloride flush  10-40 mL Intracatheter Q12H  ° triamcinolone ointment  1 application Topical Daily  ° °Continuous Infusions: ° sodium chloride Stopped (04/28/21 1032)  ° ° °Assessment & Plan: °  °Principal Problem: °  AKI (acute kidney injury) (HCC) °Active Problems: °  Pressure injury of skin °  Protein-calorie malnutrition, severe ° ° °Acute Kidney Failure with Anuria requiring RRT °Anasarca °-Patient with  acute kidney injury of unclear origin. Differential includes ATN versus nephrotic syndrome versus acute GN given hematuria. °-serologies for acute GN thus far negative, hep b/c negative, ANA, unable thus far to obtain urine sample for multiple myeloma work up °(temp HD line placed) °Negative renal US °Failed diuresis, started on CRRT 2/26-3/1, HD#1 04/29/21 °Nephrology following °3/14 dialysis today °Renal biopsy postponed to 3/6 due to aspirin use ° ° ° °  °New onset atrial fibrillation °Rate control with metoprolol.   °-CHADVASC score of 4 (age, HTN, DM), initially holding anticoagulation in setting of gross hematuria.  °3/4 had bleeding with heparin drip.  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 °3/4 continue home ophthalmic meds ° ° ° °Thrombocytopenia.  °Improving °Continue to monitor ° °  °Type 2 DM with Hypoglycemia. Hypoglycemia resolved. CBGs within goal.  °3/3 continue to monitor, add R-ISS when appropriate  ° °Hyperkalemia °Resolved with CRRT ° °Metabolic acidosis  °Resolved, CTM °  ° °Please note:  °Medication Issues; °Preferred narcotic agents for pain control are hydromorphone, fentanyl, and methadone. Morphine should not be used.  °Baclofen should be avoided °Avoid oral sodium phosphate and magnesium citrate based laxatives / bowel preps  ° ° ° °DVT prophylaxis: scd °Code Status: DNR °Family Communication: None at bedside hemodialysis °Disposition Plan:  °Status is: Inpatient °Remains inpatient appropriate because: IV treatment, needs renal biopsy,  ° ° ° ° ° ° ° ° °  ° ° ° ° ° LOS: 8 days  ° °Time spent: 35 minutes with more than 50% on COC ° ° ° °Sahar Amery, MD °[facility] °Pager [phone] ° °If 7PM-7AM, please contact night-coverage °05/01/2021, 9:02 AM   °

## 2021-05-02 DIAGNOSIS — N179 Acute kidney failure, unspecified: Secondary | ICD-10-CM | POA: Diagnosis not present

## 2021-05-02 LAB — CBC
HCT: 27.8 % — ABNORMAL LOW (ref 39.0–52.0)
Hemoglobin: 9.2 g/dL — ABNORMAL LOW (ref 13.0–17.0)
MCH: 31.5 pg (ref 26.0–34.0)
MCHC: 33.1 g/dL (ref 30.0–36.0)
MCV: 95.2 fL (ref 80.0–100.0)
Platelets: 155 10*3/uL (ref 150–400)
RBC: 2.92 MIL/uL — ABNORMAL LOW (ref 4.22–5.81)
RDW: 14.6 % (ref 11.5–15.5)
WBC: 6.7 10*3/uL (ref 4.0–10.5)
nRBC: 0 % (ref 0.0–0.2)

## 2021-05-02 LAB — GLUCOSE, CAPILLARY
Glucose-Capillary: 114 mg/dL — ABNORMAL HIGH (ref 70–99)
Glucose-Capillary: 149 mg/dL — ABNORMAL HIGH (ref 70–99)
Glucose-Capillary: 120 mg/dL — ABNORMAL HIGH (ref 70–99)
Glucose-Capillary: 138 mg/dL — ABNORMAL HIGH (ref 70–99)

## 2021-05-02 LAB — RENAL FUNCTION PANEL
Albumin: 2.5 g/dL — ABNORMAL LOW (ref 3.5–5.0)
Anion gap: 7 (ref 5–15)
BUN: 26 mg/dL — ABNORMAL HIGH (ref 8–23)
CO2: 28 mmol/L (ref 22–32)
Calcium: 8.2 mg/dL — ABNORMAL LOW (ref 8.9–10.3)
Chloride: 98 mmol/L (ref 98–111)
Creatinine, Ser: 4.51 mg/dL — ABNORMAL HIGH (ref 0.61–1.24)
GFR, Estimated: 13 mL/min — ABNORMAL LOW (ref >60)
Glucose, Bld: 116 mg/dL — ABNORMAL HIGH (ref 70–99)
Phosphorus: 2 mg/dL — ABNORMAL LOW (ref 2.5–4.6)
Potassium: 3.9 mmol/L (ref 3.5–5.1)
Sodium: 133 mmol/L — ABNORMAL LOW (ref 135–145)

## 2021-05-02 LAB — MAGNESIUM: Magnesium: 2 mg/dL (ref 1.7–2.4)

## 2021-05-02 MED ORDER — CEFAZOLIN SODIUM-DEXTROSE 2-4 GM/100ML-% IV SOLN
2.0000 g | INTRAVENOUS | Status: AC
  Filled 2021-05-02: qty 100

## 2021-05-02 NOTE — Progress Notes (Signed)
Admit: 04/23/2021 ?LOS: 9 ? ?46M AKI (unclear cause) with remote hx/o hematuria/proteinuria and volume overload req CRRT 2/26  04/28/21 ? ?Subjective:  ?HD yesterday 4L UF uneventful ?No c /o this AM ?Weights dow abuot 50lb ?Renal Bx tomorrow ?Still has R IJ Temp cath ?VSS , on RA ?No sig UOP ? ?03/04 0701 - 03/05 0700 ?In: 61 [P.O.:20] ?Out: 4080 [Urine:80] ? ?Filed Weights  ? 05/01/21 1740 05/01/21 1115 05/02/21 0328  ?Weight: 116.5 kg 111.8 kg 117.7 kg  ? ? ?Scheduled Meds: ? atropine  1 drop Right Eye QHS  ? brimonidine  1 drop Left Eye TID  ? Chlorhexidine Gluconate Cloth  6 each Topical Q0600  ? dorzolamide-timolol  1 drop Left Eye BID  ? feeding supplement  237 mL Oral TID BM  ? latanoprost  1 drop Left Eye QHS  ? mouth rinse  15 mL Mouth Rinse BID  ? multivitamin with minerals  1 tablet Oral Daily  ? pantoprazole  40 mg Oral Daily  ? pilocarpine  1 drop Left Eye TID  ? polyethylene glycol  17 g Oral Daily  ? prednisoLONE acetate  1 drop Right Eye QHS  ? sodium chloride flush  10-40 mL Intracatheter Q12H  ? triamcinolone ointment  1 application Topical Daily  ? ?Continuous Infusions: ? sodium chloride Stopped (04/28/21 1032)  ? ?PRN Meds:.sodium chloride, acetaminophen, bisacodyl, heparin sodium (porcine), heparin, metoprolol tartrate, oxyCODONE, sodium chloride flush ? ?Current Labs: reviewed ? ?Physical Exam:  Blood pressure 107/72, pulse 93, temperature 98.9 ?F (37.2 ?C), temperature source Oral, resp. rate 16, height '6\' 6"'$  (1.981 m), weight 117.7 kg, SpO2 96 %. ?NAD, conversant ?Blind ?RRR nl s1s2 ?CTAB ?Still with 2-3+ LEE, abdomen less protuberant ?Nonfocal ? ?A ?Dialysis dependent anuric AKI, unclear etiology,  ?nl GFR as of 04/2020.  ?Neg renal US at admission.    ?No UA but gross hematura on foley placement unclear if traumatic.   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  ?P ?As above ?F/u ANA ?Renal Bx and ? [facility] tomorrow ?Daily weights, Daily Renal Panel, Strict I/Os, Avoid nephrotoxins (NSAIDs, judicious IV Contrast)   ?Medication Issues; ?Preferred narcotic agents for pain control are hydromorphone, fentanyl, and methadone. Morphine should not be used.  ?Baclofen should be avoided ?Avoid oral sodium phosphate and magnesium citrate based laxatives / bowel preps  ? ? ?Pearson Grippe MD ?05/02/2021, 9:44 AM ? ?Recent Labs  ?Lab 04/30/21 ?1630 05/01/21 ?0330 05/01/21 ?1600  ?NA 132* 133* 134*  ?K 4.2 4.3 3.7  ?CL 99 97* 98  ?CO2 '25 26 28  '$ ?GLUCOSE 143* 98 123*  ?BUN 28* 29* 18  ?CREATININE 4.53* 4.86* 3.32*  ?CALCIUM 8.1* 8.4* 8.2*  ?PHOS 2.4* 2.8 2.0*  ? ? ?Recent Labs  ?Lab 04/26/21 ?0305 04/27/21 ?0103 04/29/21 ?8144 04/30/21 ?8185 05/01/21 ?0330  ?WBC 6.4   < > 5.9 6.0 6.0  ?NEUTROABS 5.1  --   --   --   --   ?HGB 12.9*   < > 10.7* 10.2* 10.0*  ?HCT 37.7*   < >  31.9* 29.7* 28.8*  ?MCV 92.4   < > 94.4 93.1 94.7  ?PLT 111*   < > 125* 129* 142*  ? < > = values in this interval not displayed.  ? ? ? ? ? ? ? ? ? ? ?  ?

## 2021-05-02 NOTE — Consult Note (Signed)
Chief Complaint: Patient was seen in consultation today for tunneled dialysis catheter placement Chief Complaint  Patient presents with   Shortness of Breath   Weakness   Dysuria   at the request of Dr Alfonso Ellis   Supervising Physician: Juliet Rude  Patient Status: [facility] - In-pt  History of Present Illness: TENG DECOU is a 81 y.o. male   Acute renal failure Hematuria; proteinuria Dialysis dependent anuric AKI Nephrology ordered Renal biopsy last week----timing had to wait secondary ASA daily (LD 04/28/21) Now scheduled for same 3/6 in IR  New request today for tunneled dialysis catheter placement in IR also  Will try to do both procedures in IR 05/03/21  Past Medical History:  Diagnosis Date   Blind    Diabetes mellitus without complication ([place])    Hypertension     Past Surgical History:  Procedure Laterality Date   IR PARACENTESIS  04/23/2021    Allergies: Guaifenesin, Influenza vaccines, and Lexapro [escitalopram]  Medications: Prior to Admission medications   Medication Sig Start Date End Date Taking? Authorizing Provider  acetaminophen (TYLENOL) 500 MG tablet Take 500 mg by mouth daily as needed for headache (pain).   Yes [provider]  amLODipine (NORVASC) 10 MG tablet Take 10 mg by mouth every morning. 04/11/12  Yes [provider]  aspirin EC 81 MG tablet 81 mg every morning.   Yes [provider]  atropine 1 % ophthalmic solution Place 1 drop into the right eye at bedtime. 11/11/19  Yes [provider]  brimonidine (ALPHAGAN) 0.2 % ophthalmic solution Place 1 drop into the left eye 3 (three) times daily. 02/22/21  Yes [provider]  cholecalciferol (VITAMIN D3) 25 MCG (1000 UNIT) tablet Take 1,000 Units by mouth 2 (two) times daily.   Yes [provider]  dorzolamide-timolol (COSOPT) 22.3-6.8 MG/ML ophthalmic solution Place 1 drop into the left eye 2 (two) times daily. 04/11/12  Yes [provider]  doxazosin (CARDURA) 8 MG tablet Take 1 tablet (8 mg total) by mouth daily. Patient taking differently: Take 8 mg by mouth every morning. 11/13/20  Yes Stoioff, Ronda Fairly, MD  dutasteride (AVODART) 0.5 MG capsule Take 1 capsule (0.5 mg total) by mouth daily. Patient taking differently: Take 0.5 mg by mouth every Monday, Wednesday, and Friday. 11/13/20  Yes Stoioff, Ronda Fairly, MD  furosemide (LASIX) 40 MG tablet Take 40 mg by mouth every morning. 11/18/20  Yes [provider]  Garlic 250 MG TABS Take 500 mg by mouth every morning.   Yes [provider]  latanoprost (XALATAN) 0.005 % ophthalmic solution Place 1 drop into the left eye at bedtime. 04/11/12  Yes [provider]  metFORMIN (GLUCOPHAGE) 500 MG tablet Take 250 mg by mouth 2 (two) times daily. 03/20/13  Yes [provider]  metoprolol tartrate (LOPRESSOR) 50 MG tablet Take 50 mg by mouth 2 (two) times daily. 05/23/14  Yes [provider]  Multiple Vitamin (MULTIVITAMIN WITH MINERALS) TABS tablet Take 1 tablet by mouth in the morning.   Yes [provider]  Multiple Vitamins-Minerals (PRESERVISION AREDS 2) CAPS Take 1 capsule by mouth 2 (two) times daily.   Yes [provider]  olmesartan-hydrochlorothiazide (BENICAR HCT) 40-12.5 MG tablet Take 1 tablet by mouth every morning. 07/16/12  Yes [provider]  OVER THE COUNTER MEDICATION Take 1 capsule by mouth See admin instructions. Go Out (OTC) - take one capsule by mouth on Tuesday and Thursday morning (for gout)  Yes [provider]  pilocarpine (PILOCAR) 4 % ophthalmic solution Place 1 drop into the left eye 3 (three) times daily. 06/20/17  Yes [provider]  potassium chloride SA (KLOR-CON M) 20 MEQ tablet Take 10 mEq by mouth every morning.   Yes [provider]  prednisoLONE acetate (PRED FORTE) 1 % ophthalmic suspension Place 1 drop into the right eye at bedtime. 11/12/19  Yes  [provider]  triamcinolone ointment (KENALOG) 0.1 % Apply 1 application topically See admin instructions. Apply topically legs nightly after compression stockings are removed 11/20/20  Yes [provider]  vitamin C (ASCORBIC ACID) 500 MG tablet Take 500 mg by mouth every Saturday.   Yes [provider]  vitamin E 180 MG (400 UNITS) capsule Take 400 Units by mouth every morning.   Yes [provider]     History reviewed. No pertinent family history.  Social History   Socioeconomic History   Marital status: Married    Spouse name: Not on file   Number of children: Not on file   Years of education: Not on file   Highest education level: Not on file  Occupational History   Not on file  Tobacco Use   Smoking status: Never   Smokeless tobacco: Never  Vaping Use   Vaping Use: Never used  Substance and Sexual Activity   Alcohol use: Not Currently   Drug use: Never   Sexual activity: Yes  Other Topics Concern   Not on file  Social History Narrative   Not on file   Social Determinants of Health   Financial Resource Strain: Not on file  Food Insecurity: Not on file  Transportation Needs: Not on file  Physical Activity: Not on file  Stress: Not on file  Social Connections: Not on file     Review of Systems: A 12 point ROS discussed and pertinent positives are indicated in the HPI above.  All other systems are negative.  Review of Systems  Constitutional:  Positive for activity change and fatigue. Negative for fever.  Respiratory:  Negative for cough and shortness of breath.   Gastrointestinal:  Negative for abdominal pain.  Neurological:  Positive for weakness.  Psychiatric/Behavioral:  Positive for decreased concentration. Negative for behavioral problems.    Vital Signs: BP 107/72 (BP Location: Right Arm)    Pulse 93    Temp 98.9 F (37.2 C) (Oral)    Resp 16    Ht '6\' 6"'$  (1.981 m)    Wt 259 lb 7.7 oz (117.7 kg)    SpO2 96%    BMI  29.99 kg/m   Physical Exam Vitals reviewed.  HENT:     Mouth/Throat:     Mouth: Mucous membranes are moist.  Cardiovascular:     Rate and Rhythm: Normal rate and regular rhythm.  Pulmonary:     Effort: Pulmonary effort is normal.     Breath sounds: Normal breath sounds.  Musculoskeletal:     Right lower leg: Edema present.     Left lower leg: Edema present.  Skin:    General: Skin is warm.  Neurological:     Mental Status: He is alert and oriented to person, place, and time.     Comments: Pt is blind Wife signed consent at bedside  Psychiatric:        Behavior: Behavior normal.    Imaging: CT ABDOMEN PELVIS WO CONTRAST  Result Date: 04/23/2021 CLINICAL DATA:  Renal failure, anuria EXAM: CT ABDOMEN AND  PELVIS WITHOUT CONTRAST TECHNIQUE: Multidetector CT imaging of the abdomen and pelvis was performed following the standard protocol without IV contrast. RADIATION DOSE REDUCTION: This exam was performed according to the departmental dose-optimization program which includes automated exposure control, adjustment of the mA and/or kV according to patient size and/or use of iterative reconstruction technique. COMPARISON:  None. FINDINGS: Lower chest: Trace bilateral pleural effusions with associated compressive atelectasis. Cardiomegaly. Coronary artery atherosclerosis. Hepatobiliary: Unremarkable unenhanced appearance of the liver. No focal liver lesion identified. Gallbladder within normal limits. No hyperdense gallstone. No biliary dilatation. Pancreas: Unremarkable. No pancreatic ductal dilatation or surrounding inflammatory changes. Spleen: Normal in size without focal abnormality. Adrenals/Urinary Tract: Unremarkable adrenal glands. 3.3 cm lower pole left renal cyst. Kidneys are otherwise unremarkable. No renal stone or hydronephrosis. Urinary bladder is decompressed by Foley catheter. Stomach/Bowel: Small hiatal hernia. Stomach appears otherwise within normal limits. Colonic  diverticulosis. No evidence of bowel wall thickening, distention, or inflammatory changes. Vascular/Lymphatic: Scattered aortoiliac atherosclerotic calcifications without aneurysm. No abdominopelvic lymphadenopathy. Reproductive: Prostatomegaly. Other: Moderate-large volume ascites. No pneumoperitoneum. No abdominal wall hernia. Musculoskeletal: Diffuse anasarca. Scattered densely sclerotic bone islands within the pelvis and spine. No acute osseous findings. IMPRESSION: 1. No evidence of obstructive uropathy. 2. Moderate-large volume ascites with diffuse anasarca. 3. Trace bilateral pleural effusions with associated compressive atelectasis. 4. Colonic diverticulosis without evidence of acute diverticulitis. 5. Prostatomegaly. 6. Aortic Atherosclerosis (ICD10-I70.0). Electronically Signed   By: Davina Poke D.O.   On: 04/23/2021 14:13   US RENAL  Result Date: 04/23/2021 CLINICAL DATA:  Acute kidney injury EXAM: RENAL / URINARY TRACT ULTRASOUND COMPLETE COMPARISON:  Same day CT. FINDINGS: Right Kidney: Renal measurements: 13.1 x 6.0 x 7.3 cm = volume: 300.4 mL. Increased renal cortical echogenicity. No hydronephrosis. Left Kidney: Renal measurements: 13.9 x 5.7 x 6.5 cm = volume: 269.0 mL. Increased renal cortical echogenicity. No hydronephrosis. There is a simple appearing cyst in the inferior pole measuring 4.3 x 3.0 x 2.8 cm. Bladder: Foley catheter in place. Other: Bilateral pleural effusions.  Large volume ascites. IMPRESSION: Increased renal cortical echogenicity bilaterally, as can be seen in medical renal disease. No hydronephrosis. Bilateral pleural effusions and large volume ascites noted. Electronically Signed   By: Maurine Simmering M.D.   On: 04/23/2021 14:52   DG CHEST PORT 1 VIEW  Result Date: 04/25/2021 CLINICAL DATA:  Placement of central venous catheter EXAM: PORTABLE CHEST 1 VIEW COMPARISON:  04/23/2021 FINDINGS: Transverse diameter of heart is increased. Central pulmonary vessels are more  prominent. Increased interstitial and alveolar markings are seen in the parahilar regions and lower lung fields, more so in the left lower lung fields. There is no pneumothorax. There is interval placement of large caliber right IJ central venous catheter with its tip in superior vena cava. IMPRESSION: Cardiomegaly. Central pulmonary vessels are more prominent suggesting CHF. Tip of right IJ central venous catheter is seen in the superior vena cava. There is no pneumothorax. Electronically Signed   By: Elmer Picker M.D.   On: 04/25/2021 15:56   DG Chest Portable 1 View  Result Date: 04/23/2021 CLINICAL DATA:  81 year old male with dyspnea, atrial fibrillation. EXAM: PORTABLE CHEST - 1 VIEW COMPARISON:  None. FINDINGS: The mediastinal contours are within normal limits. Cardiomegaly. Low lung volumes. Mild bibasilar streaky opacities. No evidence of significant pleural effusion or pneumothorax. No acute osseous abnormality. IMPRESSION: Cardiomegaly with low lung volumes and bibasilar subsegmental atelectasis. Electronically Signed   By: Ruthann Cancer M.D.   On: 04/23/2021 12:35  ECHOCARDIOGRAM COMPLETE  Result Date: 04/24/2021    ECHOCARDIOGRAM REPORT   Patient Name:   CORNELIOUS BARTOLUCCI Date of Exam: 04/24/2021 Medical Rec #:  [id number]      Height:       78.0 in Accession #:    [id number]     Weight:       269.4 lb Date of Birth:  12-13-1940      BSA:          2.562 m Patient Age:    76 years       BP:           101/75 mmHg Patient Gender: M              HR:           68 bpm. Exam Location:  Inpatient Procedure: 2D Echo, Color Doppler and Cardiac Doppler Indications:    W10.93 Acute diastolic (congestive) heart failure  History:        Patient has no prior history of Echocardiogram examinations.                 Risk Factors:Hypertension and Diabetes.  Sonographer:    Raquel Sarna Senior RDCS Referring Phys: [id number] [place]  1. Left ventricular ejection fraction, by estimation, is 55 to 60%.  The left ventricle has normal function. The left ventricle has no regional wall motion abnormalities. The left ventricular internal cavity size was moderately dilated. Left ventricular diastolic parameters are indeterminate. There is the interventricular septum is flattened in systole and diastole, consistent with right ventricular pressure and volume overload.  2. Right ventricular systolic function is moderately reduced. The right ventricular size is severely enlarged. There is moderately elevated pulmonary artery systolic pressure.  3. Left atrial size was mildly dilated.  4. Right atrial size was severely dilated.  5. Moderate pleural effusion in the left lateral region.  6. The mitral valve is normal in structure. Mild mitral valve regurgitation. No evidence of mitral stenosis.  7. Tricuspid valve regurgitation is moderate to severe.  8. The aortic valve is tricuspid. Aortic valve regurgitation is trivial. Aortic valve sclerosis is present, with no evidence of aortic valve stenosis.  9. Aortic dilatation noted. There is mild dilatation of the aortic root, measuring 43 mm. There is mild dilatation of the ascending aorta, measuring 43 mm. There is mild dilatation of the aortic arch, measuring 43 mm. 10. The inferior vena cava is dilated in size with <50% respiratory variability, suggesting right atrial pressure of 15 mmHg. Comparison(s): No prior Echocardiogram. FINDINGS  Left Ventricle: Left ventricular ejection fraction, by estimation, is 55 to 60%. The left ventricle has normal function. The left ventricle has no regional wall motion abnormalities. The left ventricular internal cavity size was moderately dilated. There is no left ventricular hypertrophy. The interventricular septum is flattened in systole and diastole, consistent with right ventricular pressure and volume overload. Left ventricular diastolic parameters are indeterminate. Right Ventricle: The right ventricular size is severely enlarged. Right  ventricular systolic function is moderately reduced. There is moderately elevated pulmonary artery systolic pressure. The tricuspid regurgitant velocity is 2.89 m/s, and with an assumed right atrial pressure of 15 mmHg, the estimated right ventricular systolic pressure is 23.5 mmHg. Left Atrium: Left atrial size was mildly dilated. Right Atrium: Right atrial size was severely dilated. Pericardium: Trivial pericardial effusion is present. Mitral Valve: The mitral valve is normal in structure. Mild mitral valve regurgitation. No evidence of mitral valve stenosis. Tricuspid Valve: The tricuspid  valve is normal in structure. Tricuspid valve regurgitation is moderate to severe. No evidence of tricuspid stenosis. Aortic Valve: The aortic valve is tricuspid. Aortic valve regurgitation is trivial. Aortic valve sclerosis is present, with no evidence of aortic valve stenosis. Pulmonic Valve: The pulmonic valve was normal in structure. Pulmonic valve regurgitation is mild. No evidence of pulmonic stenosis. Aorta: Aortic dilatation noted. There is mild dilatation of the aortic root, measuring 43 mm. There is mild dilatation of the ascending aorta, measuring 43 mm. There is mild dilatation of the aortic arch, measuring 43 mm. Venous: The inferior vena cava is dilated in size with less than 50% respiratory variability, suggesting right atrial pressure of 15 mmHg. IAS/Shunts: No atrial level shunt detected by color flow Doppler. Additional Comments: There is a moderate pleural effusion in the left lateral region.  LEFT VENTRICLE PLAX 2D LVIDd:         6.30 cm LVIDs:         4.60 cm LV PW:         0.90 cm LV IVS:        1.20 cm LVOT diam:     2.10 cm LV SV:         51 LV SV Index:   20 LVOT Area:     3.46 cm  RIGHT VENTRICLE RV S prime:     10.70 cm/s TAPSE (M-mode): 1.9 cm LEFT ATRIUM             Index        RIGHT ATRIUM           Index LA diam:        4.50 cm 1.76 cm/m   RA Area:     39.30 cm LA Vol (A2C):   96.8 ml 37.78  ml/m  RA Volume:   169.00 ml 65.96 ml/m LA Vol (A4C):   94.6 ml 36.92 ml/m LA Biplane Vol: 99.4 ml 38.79 ml/m  AORTIC VALVE LVOT Vmax:   72.97 cm/s LVOT Vmean:  52.700 cm/s LVOT VTI:    0.148 m  AORTA Ao Root diam: 4.30 cm Ao Asc diam:  4.30 cm MITRAL VALVE               TRICUSPID VALVE MV Area (PHT): 3.42 cm    TR Peak grad:   33.4 mmHg MV Decel Time: 222 msec    TR Vmax:        289.00 cm/s MV E velocity: 75.50 cm/s MV A velocity: 26.20 cm/s  SHUNTS MV E/A ratio:  2.88        Systemic VTI:  0.15 m                            Systemic Diam: 2.10 cm Kirk Ruths MD Electronically signed by Kirk Ruths MD Signature Date/Time: 04/24/2021/1:50:43 PM    Final    IR Paracentesis  Result Date: 04/24/2021 INDICATION: Patient history congestive heart failure, admitted with acute renal and anasarca found to have new onset ascites. Request IR for therapeutic paracentesis EXAM: ULTRASOUND GUIDED THERAPEUTIC PARACENTESIS MEDICATIONS: 7 mL 1% lidocaine COMPLICATIONS: None immediate. PROCEDURE: Informed written consent was obtained from the patient after a discussion of the risks, benefits and alternatives to treatment. A timeout was performed prior to the initiation of the procedure. Initial ultrasound scanning demonstrates a large amount of ascites within the right lower abdominal quadrant. The right lower abdomen was prepped and draped in the usual sterile  fashion. 1% lidocaine was used for local anesthesia. Following this, a 19 gauge, 7-cm, Yueh catheter was introduced. An ultrasound image was saved for documentation purposes. The paracentesis was performed. The catheter was removed and a dressing was applied. The patient tolerated the procedure well without immediate post procedural complication. FINDINGS: A total of approximately 8.3 L of clear yellow fluid was removed. IMPRESSION: Successful ultrasound-guided paracentesis yielding 8.3 liters of peritoneal fluid. Read by Candiss Norse, PA-C Electronically  Signed   By: Aletta Edouard M.D.   On: 04/24/2021 07:53    Labs:  CBC: Recent Labs    04/29/21 0415 04/30/21 0438 05/01/21 0330 05/02/21 1028  WBC 5.9 6.0 6.0 6.7  HGB 10.7* 10.2* 10.0* 9.2*  HCT 31.9* 29.7* 28.8* 27.8*  PLT 125* 129* 142* 155    COAGS: Recent Labs    04/23/21 2021 04/27/21 0103 04/29/21 0415  INR 1.6* 1.5* 1.4*    BMP: Recent Labs    04/30/21 0437 04/30/21 1630 05/01/21 0330 05/01/21 1600  NA 134* 132* 133* 134*  K 4.0 4.2 4.3 3.7  CL 98 99 97* 98  CO2 '28 25 26 28  '$ GLUCOSE 97 143* 98 123*  BUN 22 28* 29* 18  CALCIUM 8.3* 8.1* 8.4* 8.2*  CREATININE 3.94* 4.53* 4.86* 3.32*  GFRNONAA 15* 12* 11* 18*    LIVER FUNCTION TESTS: Recent Labs    04/23/21 1137 04/24/21 0546 04/25/21 0231 04/25/21 1550 04/26/21 0305 04/26/21 1554 04/30/21 0437 04/30/21 1630 05/01/21 0330 05/01/21 1600  BILITOT 1.2  --  1.1  --  1.2  --   --   --   --   --   AST 18  --  16  --  19  --   --   --   --   --   ALT 11  --  10  --  9  --   --   --   --   --   ALKPHOS 80  --  62  --  65  --   --   --   --   --   PROT 7.1  --  6.1*  --  6.4*  --   --   --   --   --   ALBUMIN 3.4*   < > 3.4*   < > 3.4*   < > 2.7* 2.6* 2.7* 2.7*   < > = values in this interval not displayed.    TUMOR MARKERS: No results for input(s): AFPTM, CEA, CA199, CHROMGRNA in the last 8760 hours.  Assessment and Plan:  Pt is already scheduled for random renal biopsy in IR tomorrow Now plan is to add tunneled dialysis catheter placement if can Risks and benefits discussed with the patient including, but not limited to bleeding, infection, vascular injury, pneumothorax which may require chest tube placement, air embolism or even death  All of the patient's questions were answered, patient is agreeable to proceed. Consent signed and in chart.   Thank you for this interesting consult.  I greatly enjoyed meeting LINWOOD GULLIKSON and look forward to participating in their care.  A copy of this  report was sent to the requesting provider on this date.  Electronically Signed: Lavonia Drafts, PA-C 05/02/2021, 12:08 PM   I spent a total of 20 Minutes    in face to face in clinical consultation, greater than 50% of which was counseling/coordinating care for tunneled dialysis catheter placement

## 2021-05-02 NOTE — Progress Notes (Signed)
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   Patient started to have increasing bilateral lower extremities swelling about 3 months ago and was started of po Lasix 40 mg daily, swelling appears to be improving, patient however had episodes of feeling lightheadedness and "too much urination" and decided to cut down Lasix from 40 mg daily to 20 mg daily about 1 month ago. Condition remained stable since until about 1 week ago, patient started to notice swelling in his abdomen and legs, and decreased urine output.  Finally, for the past 3 days, there has been barely any urine came out. He said he feeling "bladder fullness" when having BM, but no urine came out. No abdominal pain, no back pain, no shortness of breath, no fever or chills. No Hx of CHF or stroke  2/24: Admitted to [facility] for acute on chronic renal failure, new onset a. Fib. Nephrology consulted; high dose Lasix trial initiated, large volume paracentesis of 8.3 L 2/26: PCCM consulted. Transfer to ICU for CRRT  2/27: tolerated CRRT 3/1 Heparin drip restarted 2/28 and patient has had recurrent hematuria and bleeding IV site. Heparin drip now on hold    3/3 TRH pickup  3/4 seen in dialysis has no complaints 3/5 no overnight issues.  Plan for renal biopsy in a.m.  Consultants:  Pccm, nephrology , IR  Procedures:   Antimicrobials:      Subjective: Denies shortness of breath, chest pain, dizziness or any other symptoms  Objective: Vitals:   05/01/21 2124 05/01/21 2345 05/02/21 0328 05/02/21 0811  BP: 109/73 110/79 104/65 107/72  Pulse: 95 96 100 93  Resp: '15 16 18 16  ' Temp: 98.8 F (37.1 C) 98.3 F (36.8 C) 99.4 F (37.4  C) 98.9 F (37.2 C)  TempSrc: Oral Oral Oral Oral  SpO2: 96% 98% 96% 96%  Weight:   117.7 kg   Height:        Intake/Output Summary (Last 24 hours) at 05/02/2021 0814 Last data filed at 05/02/2021 0329 Gross per 24 hour  Intake 40 ml  Output 4080 ml  Net -4040 ml   Filed Weights   05/01/21 0649 05/01/21 1115 05/02/21 0328  Weight: 116.5 kg 111.8 kg 117.7 kg    Examination: Calm, NAD Cta no w/r Reg s1/s2 no gallop Soft benign +bs, less distended +edema, but improving Aaoxox3  Mood and affect appropriate in current setting       Data Reviewed: I have personally reviewed following labs and imaging studies  CBC: Recent Labs  Lab 04/26/21 0305 04/27/21 0103 04/28/21 0105 04/29/21 0415 04/30/21 0438 05/01/21 0330  WBC 6.4 5.7 5.0 5.9 6.0 6.0  NEUTROABS 5.1  --   --   --   --   --   HGB 12.9* 13.3 12.9* 10.7* 10.2* 10.0*  HCT 37.7* 37.6* 36.3* 31.9* 29.7* 28.8*  MCV 92.4 91.3 91.9 94.4 93.1 94.7  PLT 111* 110* 106* 125* 129* 578*   Basic Metabolic Panel: Recent Labs  Lab 04/27/21 0103 04/27/21 1516 04/28/21 0105 04/28/21 1535 04/29/21 0415 04/29/21 1829 04/30/21 0437 04/30/21 0438 04/30/21 1630 05/01/21 0330 05/01/21 1600  NA 135   < > 133*   < > 134*  134* 134* 134*  --  132* 133* 134*  K 5.0   < > 4.6   < > 4.7   4.7 3.8 4.0  --  4.2 4.3 3.7  CL 102   < > 101   < > 99   99 100 98  --  99 97* 98  CO2 20*   < > 23   < > '26   27 27 28  ' --  '25 26 28  ' GLUCOSE 68*   < > 115*   < > 114*   111* 135* 97  --  143* 98 123*  BUN 64*   < > 41*   < > 31*   31* 19 22  --  28* 29* 18  CREATININE 9.20*   < > 6.00*   < > 4.96*   5.02* 3.61* 3.94*  --  4.53* 4.86* 3.32*  CALCIUM 8.9   < > 8.4*   < > 8.4*   8.4* 8.0* 8.3*  --  8.1* 8.4* 8.2*  MG 2.7*  --  2.6*  --  2.5*  --   --  2.1  --  2.2  --   PHOS 3.4   < > 2.1*   < > 2.7   2.7 2.1* 2.6  --  2.4* 2.8 2.0*   < > = values in this interval not displayed.   GFR: Estimated Creatinine Clearance: 26 mL/min (A) (by  C-G formula based on SCr of 3.32 mg/dL (H)). Liver Function Tests: Recent Labs  Lab 04/26/21 0305 04/26/21 1554 04/29/21 1829 04/30/21 0437 04/30/21 1630 05/01/21 0330 05/01/21 1600  AST 19  --   --   --   --   --   --   ALT 9  --   --   --   --   --   --   ALKPHOS 65  --   --   --   --   --   --   BILITOT 1.2  --   --   --   --   --   --   PROT 6.4*  --   --   --   --   --   --   ALBUMIN 3.4*   < > 2.8* 2.7* 2.6* 2.7* 2.7*   < > = values in this interval not displayed.   No results for input(s): LIPASE, AMYLASE in the last 168 hours. No results for input(s): AMMONIA in the last 168 hours. Coagulation Profile: Recent Labs  Lab 04/27/21 0103 04/29/21 0415  INR 1.5* 1.4*   Cardiac Enzymes: Recent Labs  Lab 04/27/21 1408  CKTOTAL 70   BNP (last 3 results) No results for input(s): PROBNP in the last 8760 hours. HbA1C: No results for input(s): HGBA1C in the last 72 hours. CBG: Recent Labs  Lab 04/30/21 2128 05/01/21 0611 05/01/21 1632 05/01/21 2127 05/02/21 0612  GLUCAP 106* 90 130* 118* 114*   Lipid Profile: No results for input(s): CHOL, HDL, LDLCALC, TRIG, CHOLHDL, LDLDIRECT in the last 72 hours. Thyroid Function Tests: No results for input(s): TSH, T4TOTAL, FREET4, T3FREE, THYROIDAB in the last 72 hours. Anemia Panel: No results for input(s): VITAMINB12, FOLATE, FERRITIN, TIBC, IRON, RETICCTPCT in the last 72 hours. Sepsis Labs: No results for input(s): PROCALCITON, LATICACIDVEN in the last 168 hours.   Recent Results (from the past 240 hour(s))  Resp Panel by RT-PCR (Flu A&B, Covid) Nasopharyngeal Swab     Status: None   Collection Time:  04/23/21 12:23 PM   Specimen: Nasopharyngeal Swab; Nasopharyngeal(NP) swabs in vial transport medium  Result Value Ref Range Status   SARS Coronavirus 2 by RT PCR NEGATIVE NEGATIVE Final    Comment: (NOTE) SARS-CoV-2 target nucleic acids are NOT DETECTED.  The SARS-CoV-2 RNA is generally detectable in upper  respiratory specimens during the acute phase of infection. The lowest concentration of SARS-CoV-2 viral copies this assay can detect is 138 copies/mL. A negative result does not preclude SARS-Cov-2 infection and should not be used as the sole basis for treatment or other patient management decisions. A negative result may occur with  improper specimen collection/handling, submission of specimen other than nasopharyngeal swab, presence of viral mutation(s) within the areas targeted by this assay, and inadequate number of viral copies(<138 copies/mL). A negative result must be combined with clinical observations, patient history, and epidemiological information. The expected result is Negative.  Fact Sheet for Patients:  EntrepreneurPulse.com.au  Fact Sheet for Healthcare Providers:  IncredibleEmployment.be  This test is no t yet approved or cleared by the [place] FDA and  has been authorized for detection and/or diagnosis of SARS-CoV-2 by FDA under an Emergency Use Authorization (EUA). This EUA will remain  in effect (meaning this test can be used) for the duration of the COVID-19 declaration under Section 564(b)(1) of the Act, 21 U.S.C.section 360bbb-3(b)(1), unless the authorization is terminated  or revoked sooner.       Influenza A by PCR NEGATIVE NEGATIVE Final   Influenza B by PCR NEGATIVE NEGATIVE Final    Comment: (NOTE) The Xpert Xpress SARS-CoV-2/FLU/RSV plus assay is intended as an aid in the diagnosis of influenza from Nasopharyngeal swab specimens and should not be used as a sole basis for treatment. Nasal washings and aspirates are unacceptable for Xpert Xpress SARS-CoV-2/FLU/RSV testing.  Fact Sheet for Patients: EntrepreneurPulse.com.au  Fact Sheet for Healthcare Providers: IncredibleEmployment.be  This test is not yet approved or cleared by the [place] FDA and has been  authorized for detection and/or diagnosis of SARS-CoV-2 by FDA under an Emergency Use Authorization (EUA). This EUA will remain in effect (meaning this test can be used) for the duration of the COVID-19 declaration under Section 564(b)(1) of the Act, 21 U.S.C. section 360bbb-3(b)(1), unless the authorization is terminated or revoked.  Performed at [facility], [place] [address] [place], [place] [address]   MRSA Next Gen by PCR, Nasal     Status: None   Collection Time: 04/25/21 11:41 AM   Specimen: Nasal Mucosa; Nasal Swab  Result Value Ref Range Status   MRSA by PCR Next Gen NOT DETECTED NOT DETECTED Final    Comment: (NOTE) The GeneXpert MRSA Assay (FDA approved for NASAL specimens only), is one component of a comprehensive MRSA colonization surveillance program. It is not intended to diagnose MRSA infection nor to guide or monitor treatment for MRSA infections. Test performance is not FDA approved in patients less than 44 years old. Performed at [facility], [place] [address], [place], [place] [address]          Radiology Studies: No results found.      Scheduled Meds:  atropine  1 drop Right Eye QHS   brimonidine  1 drop Left Eye TID   Chlorhexidine Gluconate Cloth  6 each Topical Q0600   dorzolamide-timolol  1 drop Left Eye BID   feeding supplement  237 mL Oral TID BM   latanoprost  1 drop Left Eye QHS   mouth rinse  15 mL Mouth Rinse BID   multivitamin with minerals  1 tablet Oral Daily   pantoprazole  40 mg Oral Daily   pilocarpine  1 drop Left Eye TID   polyethylene glycol  17 g Oral Daily   prednisoLONE acetate  1 drop Right Eye QHS   sodium chloride flush  10-40 mL Intracatheter Q12H   triamcinolone ointment  1 application Topical Daily   Continuous Infusions:  sodium chloride Stopped (04/28/21 1032)    Assessment & Plan:   Principal Problem:   AKI (acute kidney injury) ([place]) Active Problems:   Pressure injury of skin   Protein-calorie  malnutrition, severe   Acute Kidney Failure with Anuria requiring RRT Anasarca -Patient with acute kidney injury of unclear origin. Differential includes ATN versus nephrotic syndrome versus acute GN given hematuria. -serologies for acute GN thus far negative, hep b/c negative, ANA, unable thus far to obtain urine sample for multiple myeloma work up (temp HD line placed) Negative renal US Failed diuresis, started on CRRT 2/26-3/1, HD#1 04/29/21 Nephrology following 3/5 had dialysis yesterday Plan for renal biopsy by IR tomorrow, (due to aspirin use)         New onset atrial fibrillation Rate control with metoprolol.   -CHADVASC score of 4 (age, HTN, DM), initially holding anticoagulation in setting of gross hematuria.  3/5 had bleeding with heparin drip.  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 3/5 continue home ophthalmic meds       Thrombocytopenia.  Improving continue to monitor   Type 2 DM with Hypoglycemia. Hypoglycemia resolved.  CBGs within goal.  3/3 continue to monitor, add R-ISS when appropriate   Hyperkalemia Resolved with CRRT  Metabolic acidosis  Resolved, CTM    Please note:  Medication Issues; Preferred narcotic agents for pain control are hydromorphone, fentanyl, and methadone. Morphine should not be used.  Baclofen should be avoided Avoid oral sodium phosphate and magnesium citrate based laxatives / bowel preps     DVT prophylaxis: scd Code Status: DNR Family Communication: Wife at bedside Disposition Plan:  Status is: Inpatient Remains inpatient appropriate because: IV treatment, needs renal biopsy,                 LOS: 9 days   Time spent: 35 minutes with more than 50% on [place], MD [facility] Pager [phone]  If 7PM-7AM, please contact night-coverage 05/02/2021, 8:14 AM

## 2021-05-02 NOTE — Progress Notes (Signed)
Inpatient Rehab Admissions Coordinator:  ? ?Per OT recommendation patient was re-screened for CIR candidacy by Clemens Catholic, MS, CCC-SLP. At this time, Pt. Does not demonstrate participation/tolerance with therapies to indicate he could tolerate intensity of CIR; however,  Pt. may have potential to progress to becoming a potential CIR candidate, so CIR admissions team will follow and monitor for progress and participation with therapies and place consult order if Pt. appears to be an appropriate candidate. Please contact me with any questions.  ? ?Clemens Catholic, MS, CCC-SLP ?Rehab Admissions Coordinator  ?[phone] (celll) ?[phone] (office) ? ? ?

## 2021-05-03 ENCOUNTER — Inpatient Hospital Stay (HOSPITAL_COMMUNITY): Payer: Medicare PPO

## 2021-05-03 DIAGNOSIS — N179 Acute kidney failure, unspecified: Secondary | ICD-10-CM | POA: Diagnosis not present

## 2021-05-03 HISTORY — PX: IR FLUORO GUIDE CV LINE RIGHT: IMG2283

## 2021-05-03 HISTORY — PX: IR US GUIDE VASC ACCESS RIGHT: IMG2390

## 2021-05-03 LAB — RENAL FUNCTION PANEL
Albumin: 2.5 g/dL — ABNORMAL LOW (ref 3.5–5.0)
Albumin: 2.5 g/dL — ABNORMAL LOW (ref 3.5–5.0)
Anion gap: 8 (ref 5–15)
Anion gap: 8 (ref 5–15)
BUN: 28 mg/dL — ABNORMAL HIGH (ref 8–23)
BUN: 34 mg/dL — ABNORMAL HIGH (ref 8–23)
CO2: 27 mmol/L (ref 22–32)
CO2: 25 mmol/L (ref 22–32)
Calcium: 8.2 mg/dL — ABNORMAL LOW (ref 8.9–10.3)
Calcium: 8.3 mg/dL — ABNORMAL LOW (ref 8.9–10.3)
Chloride: 97 mmol/L — ABNORMAL LOW (ref 98–111)
Chloride: 98 mmol/L (ref 98–111)
Creatinine, Ser: 5.05 mg/dL — ABNORMAL HIGH (ref 0.61–1.24)
Creatinine, Ser: 5.58 mg/dL — ABNORMAL HIGH (ref 0.61–1.24)
GFR, Estimated: 11 mL/min — ABNORMAL LOW
GFR, Estimated: 10 mL/min — ABNORMAL LOW (ref >60)
Glucose, Bld: 101 mg/dL — ABNORMAL HIGH (ref 70–99)
Glucose, Bld: 152 mg/dL — ABNORMAL HIGH (ref 70–99)
Phosphorus: 2.4 mg/dL — ABNORMAL LOW (ref 2.5–4.6)
Phosphorus: 2.5 mg/dL (ref 2.5–4.6)
Potassium: 3.9 mmol/L (ref 3.5–5.1)
Potassium: 4.1 mmol/L (ref 3.5–5.1)
Sodium: 132 mmol/L — ABNORMAL LOW (ref 135–145)
Sodium: 131 mmol/L — ABNORMAL LOW (ref 135–145)

## 2021-05-03 LAB — GLUCOSE, CAPILLARY
Glucose-Capillary: 91 mg/dL (ref 70–99)
Glucose-Capillary: 104 mg/dL — ABNORMAL HIGH (ref 70–99)
Glucose-Capillary: 105 mg/dL — ABNORMAL HIGH (ref 70–99)
Glucose-Capillary: 129 mg/dL — ABNORMAL HIGH (ref 70–99)
Glucose-Capillary: 153 mg/dL — ABNORMAL HIGH (ref 70–99)

## 2021-05-03 LAB — CBC
HCT: 28.3 % — ABNORMAL LOW (ref 39.0–52.0)
Hemoglobin: 9.4 g/dL — ABNORMAL LOW (ref 13.0–17.0)
MCH: 31.8 pg (ref 26.0–34.0)
MCHC: 33.2 g/dL (ref 30.0–36.0)
MCV: 95.6 fL (ref 80.0–100.0)
Platelets: 169 10*3/uL (ref 150–400)
RBC: 2.96 MIL/uL — ABNORMAL LOW (ref 4.22–5.81)
RDW: 14.3 % (ref 11.5–15.5)
WBC: 8.1 10*3/uL (ref 4.0–10.5)
nRBC: 0 % (ref 0.0–0.2)

## 2021-05-03 LAB — MAGNESIUM: Magnesium: 2.1 mg/dL (ref 1.7–2.4)

## 2021-05-03 MED ORDER — FENTANYL CITRATE (PF) 100 MCG/2ML IJ SOLN
INTRAMUSCULAR | Status: AC
  Filled 2021-05-03: qty 4

## 2021-05-03 MED ORDER — PENTAFLUOROPROP-TETRAFLUOROETH EX AERO: 1.0000 "application " | INHALATION_SPRAY | CUTANEOUS | Status: DC | PRN

## 2021-05-03 MED ORDER — SODIUM CHLORIDE 0.9 % IV SOLN: 100.0000 mL | INTRAVENOUS | Status: DC | PRN

## 2021-05-03 MED ORDER — GELATIN ABSORBABLE 12-7 MM EX MISC
CUTANEOUS | Status: AC
  Filled 2021-05-03: qty 1

## 2021-05-03 MED ORDER — MIDAZOLAM HCL 2 MG/2ML IJ SOLN
INTRAMUSCULAR | Status: AC | PRN
Start: 2021-05-03 — End: 2021-05-03
  Administered 2021-05-03: 1 mg via INTRAVENOUS

## 2021-05-03 MED ORDER — ASPIRIN EC 81 MG PO TBEC
81.0000 mg | DELAYED_RELEASE_TABLET | Freq: Every day | ORAL | Status: DC
  Administered 2021-05-04 – 2021-05-14 (×10): 81 mg via ORAL
  Filled 2021-05-03 (×10): qty 1

## 2021-05-03 MED ORDER — CHLORHEXIDINE GLUCONATE CLOTH 2 % EX PADS
6.0000 | MEDICATED_PAD | Freq: Every day | CUTANEOUS | Status: DC
  Administered 2021-05-03 – 2021-05-05 (×3): 6 via TOPICAL

## 2021-05-03 MED ORDER — FENTANYL CITRATE (PF) 100 MCG/2ML IJ SOLN
INTRAMUSCULAR | Status: AC | PRN
  Administered 2021-05-03: 50 ug via INTRAVENOUS

## 2021-05-03 MED ORDER — HEPARIN SODIUM (PORCINE) 1000 UNIT/ML IJ SOLN
INTRAMUSCULAR | Status: AC
  Administered 2021-05-03: 3.2 mL
  Filled 2021-05-03: qty 10

## 2021-05-03 MED ORDER — LIDOCAINE HCL (PF) 1 % IJ SOLN: 5.0000 mL | INTRAMUSCULAR | Status: DC | PRN

## 2021-05-03 MED ORDER — CEFAZOLIN SODIUM-DEXTROSE 2-4 GM/100ML-% IV SOLN
INTRAVENOUS | Status: AC
  Administered 2021-05-03: 2 g via INTRAVENOUS
  Filled 2021-05-03: qty 100

## 2021-05-03 MED ORDER — ALTEPLASE 2 MG IJ SOLR: 2.0000 mg | Freq: Once | INTRAMUSCULAR | Status: DC | PRN

## 2021-05-03 MED ORDER — LIDOCAINE-PRILOCAINE 2.5-2.5 % EX CREA: 1.0000 "application " | TOPICAL_CREAM | CUTANEOUS | Status: DC | PRN

## 2021-05-03 MED ORDER — HEPARIN SODIUM (PORCINE) 1000 UNIT/ML DIALYSIS
1000.0000 [IU] | INTRAMUSCULAR | Status: DC | PRN
  Filled 2021-05-03: qty 1

## 2021-05-03 MED ORDER — MIDAZOLAM HCL 2 MG/2ML IJ SOLN
INTRAMUSCULAR | Status: AC
  Filled 2021-05-03: qty 4

## 2021-05-03 MED ORDER — HYDROCODONE-ACETAMINOPHEN 5-325 MG PO TABS
1.0000 | ORAL_TABLET | ORAL | Status: DC | PRN
  Administered 2021-05-03 – 2021-05-10 (×12): 2 via ORAL
  Filled 2021-05-03 (×12): qty 2

## 2021-05-03 MED ORDER — LIDOCAINE HCL (PF) 1 % IJ SOLN
INTRAMUSCULAR | Status: AC
  Filled 2021-05-03: qty 30

## 2021-05-03 MED ORDER — LIDOCAINE-EPINEPHRINE 1 %-1:100000 IJ SOLN
INTRAMUSCULAR | Status: AC
  Administered 2021-05-03: 10 mL
  Filled 2021-05-03: qty 1

## 2021-05-03 MED ORDER — MIDAZOLAM HCL 2 MG/2ML IJ SOLN
INTRAMUSCULAR | Status: AC | PRN
  Administered 2021-05-03: 1 mg via INTRAVENOUS

## 2021-05-03 NOTE — Progress Notes (Signed)
Physical Therapy Treatment ?Patient Details ?Name: Austin Wheeler ?MRN: [id number] ?DOB: 1940/05/17 ?Today's Date: 05/03/2021 ? ? ?History of Present Illness Patient is a 81 y/o male who presents on 04/23/21 with SOB, weakness and swelling abdomen/LEs. Found to have new onset A-fib, AKI injury of unknown origin started on HD, s/p paracentesis 2/24. Renal biopsy planned fof 05/03/21. PMH includes DM, HTN, legally blind. ? ?  ?PT Comments  ? ? Patient progressing well towards PT goals. Session focused on functional mobility and standing using the stedy. Mod A of 2 to stand from EOB with elevated bed height. Able to stand for a few minutes without knees blocked and worked on extending trunk/hips. Continues to have generalized pain in BUEs and LEs with movement likely due to stiffness. Tolerating more activity this session as therapy worked with pt following a bath. Will continue to follow and progress. ?  ?Recommendations for follow up therapy are one component of a multi-disciplinary discharge planning process, led by the attending physician.  Recommendations may be updated based on patient status, additional functional criteria and insurance authorization. ? ?Follow Up Recommendations ? Acute inpatient rehab (3hours/day) ?  ?  ?Assistance Recommended at Discharge Frequent or constant Supervision/Assistance  ?Patient can return home with the following Two people to help with walking and/or transfers;Help with stairs or ramp for entrance;Assistance with cooking/housework;Two people to help with bathing/dressing/bathroom ?  ?Equipment Recommendations ? Wheelchair (measurements PT);Wheelchair cushion (measurements PT);BSC/3in1  ?  ?Recommendations for Other Services   ? ? ?  ?Precautions / Restrictions Precautions ?Precautions: Fall ?Precaution Comments: legally blind, A-fib ?Restrictions ?Weight Bearing Restrictions: No  ?  ? ?Mobility ? Bed Mobility ?Overal bed mobility: Needs Assistance ?Bed Mobility: Rolling, Sidelying to  Sit ?Rolling: Mod assist, +2 for physical assistance ?Sidelying to sit: Max assist, +2 for physical assistance ?  ?  ?  ?General bed mobility comments: Rolling to right/left for assist with bath/pericare, Max A of 2 to come to sitting, assist with trunk and scooting hips forward. ?  ? ?Transfers ?Overall transfer level: Needs assistance ?Equipment used: Ambulation equipment used ?Transfers: Sit to/from Stand ?Sit to Stand: Mod assist, +2 physical assistance ?  ?  ?  ?  ?  ?General transfer comment: Used stedy to stand from elevated bed height x1 from EOB, x1 from stedy seat. Cues for proper technique, anterior weight shift and upright ?  ? ?Ambulation/Gait ?  ?  ?  ?  ?  ?  ?  ?General Gait Details: Deferred ? ? ?Stairs ?  ?  ?  ?  ?  ? ? ?Wheelchair Mobility ?  ? ?Modified Rankin (Stroke Patients Only) ?  ? ? ?  ?Balance Overall balance assessment: Needs assistance ?Sitting-balance support: Feet supported, No upper extremity supported ?Sitting balance-Leahy Scale: Fair ?Sitting balance - Comments: Initially needing UE support on bedrail progressing to no UE support. ?  ?Standing balance support: During functional activity ?Standing balance-Leahy Scale: Poor ?Standing balance comment: Able to stand in stedy for a few mins with cues for hip extension/upright. ?  ?  ?  ?  ?  ?  ?  ?  ?  ?  ?  ?  ? ?  ?Cognition Arousal/Alertness: Awake/alert ?Behavior During Therapy: [facility] for tasks assessed/performed ?Overall Cognitive Status: Within Functional Limits for tasks assessed ?  ?  ?  ?  ?  ?  ?  ?  ?  ?  ?  ?  ?  ?  ?  ?  ?  General Comments: for basic mobility tasks, not formally assessed. ?  ?  ? ?  ?Exercises   ? ?  ?General Comments General comments (skin integrity, edema, etc.): Wife present at end of session. VSS on RA. ?  ?  ? ?Pertinent Vitals/Pain Pain Assessment ?Pain Assessment: Faces ?Faces Pain Scale: Hurts even more ?Pain Location: BUEs at elbow/wrist, Rt hip ?Pain Descriptors / Indicators: Grimacing, Guarding,  Discomfort, Sore, Aching ?Pain Intervention(s): Monitored during session, Repositioned  ? ? ?Home Living   ?  ?  ?  ?  ?  ?  ?  ?  ?  ?   ?  ?Prior Function    ?  ?  ?   ? ?PT Goals (current goals can now be found in the care plan section) Progress towards PT goals: Progressing toward goals ? ?  ?Frequency ? ? ? Min 3X/week ? ? ? ?  ?PT Plan Current plan remains appropriate  ? ? ?Co-evaluation PT/OT/SLP Co-Evaluation/Treatment: Yes ?Reason for Co-Treatment: For patient/therapist safety;To address functional/ADL transfers ?PT goals addressed during session: Mobility/safety with mobility;Balance;Strengthening/ROM ?  ?  ? ?  ?AM-PAC PT "6 Clicks" Mobility   ?Outcome Measure ? Help needed turning from your back to your side while in a flat bed without using bedrails?: A Lot ?Help needed moving from lying on your back to sitting on the side of a flat bed without using bedrails?: Total ?Help needed moving to and from a bed to a chair (including a wheelchair)?: Total ?Help needed standing up from a chair using your arms (e.g., wheelchair or bedside chair)?: Total ?Help needed to walk in hospital room?: Total ?Help needed climbing 3-5 steps with a railing? : Total ?6 Click Score: 7 ? ?  ?End of Session   ?Activity Tolerance: Patient tolerated treatment well ?Patient left: in bed;with call bell/phone within reach;with family/visitor present;Other (comment) (sitting EOB, with MD present) ?Nurse Communication: Mobility status ?PT Visit Diagnosis: Pain;Muscle weakness (generalized) (M62.81);Difficulty in walking, not elsewhere classified (R26.2) ?Pain - Right/Left:  (bil) ?Pain - part of body: Shoulder;Arm;Hand ?  ? ? ?Time: 5427-0623 ?PT Time Calculation (min) (ACUTE ONLY): 36 min ? ?Charges:  $Therapeutic Activity: 8-22 mins          ?          ? ?Marisa Severin, PT, DPT ?Acute Rehabilitation Services ?Pager [phone] ?Office [phone] ? ? ? ? ? ?[place] ?05/03/2021, 11:30 AM ? ?

## 2021-05-03 NOTE — Progress Notes (Signed)
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   Patient started to have increasing bilateral lower extremities swelling about 3 months ago and was started of po Lasix 40 mg daily, swelling appears to be improving, patient however had episodes of feeling lightheadedness and "too much urination" and decided to cut down Lasix from 40 mg daily to 20 mg daily about 1 month ago. Condition remained stable since until about 1 week ago, patient started to notice swelling in his abdomen and legs, and decreased urine output.  Finally, for the past 3 days, there has been barely any urine came out. He said he feeling "bladder fullness" when having BM, but no urine came out. No abdominal pain, no back pain, no shortness of breath, no fever or chills. No Hx of CHF or stroke  2/24: Admitted to [facility] for acute on chronic renal failure, new onset a. Fib. Nephrology consulted; high dose Lasix trial initiated, large volume paracentesis of 8.3 L 2/26: PCCM consulted. Transfer to ICU for CRRT  2/27: tolerated CRRT 3/1 Heparin drip restarted 2/28 and patient has had recurrent hematuria and bleeding IV site. Heparin drip now on hold    3/3 TRH pickup  3/4 seen in dialysis has no complaints 3/5 no overnight issues.  Plan for renal biopsy in a.m. 3/6 going for renal bx this am. No complaints  Consultants:  Pccm, nephrology , IR  Procedures:   Antimicrobials:      Subjective: No sob, dizziness, or cp  Objective: Vitals:   05/02/21 1700 05/02/21 2120 05/03/21 0452 05/03/21 0850  BP: 106/75 105/72 123/87 122/78  Pulse: 79 83 88 91  Resp: '14 12 13 14  ' Temp: 98.9 F (37.2 C) 98.2 F (36.8 C) 98.6 F (37  C) 98.1 F (36.7 C)  TempSrc: Oral Oral Oral Oral  SpO2: 97% 98% 97% 97%  Weight:      Height:        Intake/Output Summary (Last 24 hours) at 05/03/2021 0855 Last data filed at 05/03/2021 0600 Gross per 24 hour  Intake 190 ml  Output 160 ml  Net 30 ml   Filed Weights   05/01/21 0649 05/01/21 1115 05/02/21 0328  Weight: 116.5 kg 111.8 kg 117.7 kg    Examination: Calm, NAD Decrease bs, no wheezing Reg s1/s2 no gallop Soft benign +bs +edema, improving Aaoxox3  Mood and affect appropriate in current setting       Data Reviewed: I have personally reviewed following labs and imaging studies  CBC: Recent Labs  Lab 04/29/21 0415 04/30/21 0438 05/01/21 0330 05/02/21 1028 05/03/21 0558  WBC 5.9 6.0 6.0 6.7 8.1  HGB 10.7* 10.2* 10.0* 9.2* 9.4*  HCT 31.9* 29.7* 28.8* 27.8* 28.3*  MCV 94.4 93.1 94.7 95.2 95.6  PLT 125* 129* 142* 155 263   Basic Metabolic Panel: Recent Labs  Lab 04/29/21 0415 04/29/21 1829 04/30/21 0438 04/30/21 1630 05/01/21 0330 05/01/21 1600 05/02/21 1028 05/02/21 1600 05/03/21 0558  NA 134*   134*   < >  --  132* 133* 134*  --  133* 132*  K 4.7   4.7   < >  --  4.2 4.3 3.7  --  3.9 3.9  CL  99   99   < >  --  99 97* 98  --  98 97*  CO2 26   27   < >  --  '25 26 28  ' --  28 27  GLUCOSE 114*   111*   < >  --  143* 98 123*  --  116* 101*  BUN 31*   31*   < >  --  28* 29* 18  --  26* 28*  CREATININE 4.96*   5.02*   < >  --  4.53* 4.86* 3.32*  --  4.51* 5.05*  CALCIUM 8.4*   8.4*   < >  --  8.1* 8.4* 8.2*  --  8.2* 8.2*  MG 2.5*  --  2.1  --  2.2  --  2.0  --  2.1  PHOS 2.7   2.7   < >  --  2.4* 2.8 2.0*  --  2.0* 2.4*   < > = values in this interval not displayed.   GFR: Estimated Creatinine Clearance: 17.1 mL/min (A) (by C-G formula based on SCr of 5.05 mg/dL (H)). Liver Function Tests: Recent Labs  Lab 04/30/21 1630 05/01/21 0330 05/01/21 1600 05/02/21 1600 05/03/21 0558  ALBUMIN 2.6* 2.7* 2.7* 2.5* 2.5*   No results for input(s):  LIPASE, AMYLASE in the last 168 hours. No results for input(s): AMMONIA in the last 168 hours. Coagulation Profile: Recent Labs  Lab 04/27/21 0103 04/29/21 0415  INR 1.5* 1.4*   Cardiac Enzymes: Recent Labs  Lab 04/27/21 1408  CKTOTAL 70   BNP (last 3 results) No results for input(s): PROBNP in the last 8760 hours. HbA1C: No results for input(s): HGBA1C in the last 72 hours. CBG: Recent Labs  Lab 05/02/21 0612 05/02/21 1253 05/02/21 1708 05/02/21 2107 05/03/21 0615  GLUCAP 114* 149* 120* 138* 104*   Lipid Profile: No results for input(s): CHOL, HDL, LDLCALC, TRIG, CHOLHDL, LDLDIRECT in the last 72 hours. Thyroid Function Tests: No results for input(s): TSH, T4TOTAL, FREET4, T3FREE, THYROIDAB in the last 72 hours. Anemia Panel: No results for input(s): VITAMINB12, FOLATE, FERRITIN, TIBC, IRON, RETICCTPCT in the last 72 hours. Sepsis Labs: No results for input(s): PROCALCITON, LATICACIDVEN in the last 168 hours.   Recent Results (from the past 240 hour(s))  Resp Panel by RT-PCR (Flu A&B, Covid) Nasopharyngeal Swab     Status: None   Collection Time: 04/23/21 12:23 PM   Specimen: Nasopharyngeal Swab; Nasopharyngeal(NP) swabs in vial transport medium  Result Value Ref Range Status   SARS Coronavirus 2 by RT PCR NEGATIVE NEGATIVE Final    Comment: (NOTE) SARS-CoV-2 target nucleic acids are NOT DETECTED.  The SARS-CoV-2 RNA is generally detectable in upper respiratory specimens during the acute phase of infection. The lowest concentration of SARS-CoV-2 viral copies this assay can detect is 138 copies/mL. A negative result does not preclude SARS-Cov-2 infection and should not be used as the sole basis for treatment or other patient management decisions. A negative result may occur with  improper specimen collection/handling, submission of specimen other than nasopharyngeal swab, presence of viral mutation(s) within the areas targeted by this assay, and inadequate  number of viral copies(<138 copies/mL). A negative result must be combined with clinical observations, patient history, and epidemiological information. The expected result is Negative.  Fact Sheet for Patients:  EntrepreneurPulse.com.au  Fact Sheet for Healthcare Providers:  IncredibleEmployment.be  This test is no t yet approved or cleared by the [place] FDA and  has been  authorized for detection and/or diagnosis of SARS-CoV-2 by FDA under an Emergency Use Authorization (EUA). This EUA will remain  in effect (meaning this test can be used) for the duration of the COVID-19 declaration under Section 564(b)(1) of the Act, 21 U.S.C.section 360bbb-3(b)(1), unless the authorization is terminated  or revoked sooner.       Influenza A by PCR NEGATIVE NEGATIVE Final   Influenza B by PCR NEGATIVE NEGATIVE Final    Comment: (NOTE) The Xpert Xpress SARS-CoV-2/FLU/RSV plus assay is intended as an aid in the diagnosis of influenza from Nasopharyngeal swab specimens and should not be used as a sole basis for treatment. Nasal washings and aspirates are unacceptable for Xpert Xpress SARS-CoV-2/FLU/RSV testing.  Fact Sheet for Patients: EntrepreneurPulse.com.au  Fact Sheet for Healthcare Providers: IncredibleEmployment.be  This test is not yet approved or cleared by the [place] FDA and has been authorized for detection and/or diagnosis of SARS-CoV-2 by FDA under an Emergency Use Authorization (EUA). This EUA will remain in effect (meaning this test can be used) for the duration of the COVID-19 declaration under Section 564(b)(1) of the Act, 21 U.S.C. section 360bbb-3(b)(1), unless the authorization is terminated or revoked.  Performed at [facility], [place] [address] [place], [place] [address]   MRSA Next Gen by PCR, Nasal     Status: None   Collection Time: 04/25/21 11:41 AM   Specimen: Nasal  Mucosa; Nasal Swab  Result Value Ref Range Status   MRSA by PCR Next Gen NOT DETECTED NOT DETECTED Final    Comment: (NOTE) The GeneXpert MRSA Assay (FDA approved for NASAL specimens only), is one component of a comprehensive MRSA colonization surveillance program. It is not intended to diagnose MRSA infection nor to guide or monitor treatment for MRSA infections. Test performance is not FDA approved in patients less than 24 years old. Performed at [facility], [place] [address], [place], [place] [address]          Radiology Studies: No results found.      Scheduled Meds:  atropine  1 drop Right Eye QHS   brimonidine  1 drop Left Eye TID   Chlorhexidine Gluconate Cloth  6 each Topical Q0600   Chlorhexidine Gluconate Cloth  6 each Topical Q0600   dorzolamide-timolol  1 drop Left Eye BID   feeding supplement  237 mL Oral TID BM   latanoprost  1 drop Left Eye QHS   mouth rinse  15 mL Mouth Rinse BID   multivitamin with minerals  1 tablet Oral Daily   pantoprazole  40 mg Oral Daily   pilocarpine  1 drop Left Eye TID   polyethylene glycol  17 g Oral Daily   prednisoLONE acetate  1 drop Right Eye QHS   sodium chloride flush  10-40 mL Intracatheter Q12H   triamcinolone ointment  1 application Topical Daily   Continuous Infusions:  sodium chloride Stopped (04/28/21 1032)    ceFAZolin (ANCEF) IV      Assessment & Plan:   Principal Problem:   AKI (acute kidney injury) ([place]) Active Problems:   Pressure injury of skin   Protein-calorie malnutrition, severe   Acute Kidney Failure with Anuria requiring RRT Anasarca -Patient with acute kidney injury of unclear origin. Differential includes ATN versus nephrotic syndrome versus acute GN given hematuria. -serologies for acute GN thus far negative, hep b/c negative, ANA, unable thus far to obtain urine sample for multiple myeloma work up (temp HD line placed) Negative renal US  Failed diuresis, started on CRRT 2/26-3/1,  HD#1 04/29/21 Nephrology following 3/6 going for IR today for renal biopsy  Makes HD on 05/04/2021        New onset atrial fibrillation Rate control with metoprolol.   -CHADVASC score of 4 (age, HTN, DM), initially holding anticoagulation in setting of gross hematuria.  3/6 bleeding with heparin drip.  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 3/6 continue home ophthalmic meds    Thrombocytopenia.  improved and resolved   Type 2 DM with Hypoglycemia. Hypoglycemia resolved.  CBGs within goal.  3/3 continue to monitor, add R-ISS when appropriate   Hyperkalemia Resolved with CRRT  Metabolic acidosis  Resolved, CTM    Please note:  Medication Issues; Preferred narcotic agents for pain control are hydromorphone, fentanyl, and methadone. Morphine should not be used.  Baclofen should be avoided Avoid oral sodium phosphate and magnesium citrate based laxatives / bowel preps     DVT prophylaxis: scd Code Status: DNR Family Communication: Wife at bedside Disposition Plan:  Status is: Inpatient Remains inpatient appropriate because: IV treatment, needs renal biopsy,                 LOS: 10 days   Time spent: 35 minutes with more than 50% on [place], MD [facility] Pager [phone]  If 7PM-7AM, please contact night-coverage 05/03/2021, 8:55 AM

## 2021-05-03 NOTE — TOC Progression Note (Signed)
Transition of Care (TOC) - Progression Note  ? ? ?Patient Details  ?Name: Austin Wheeler ?MRN: [id number] ?Date of Birth: 1941/02/23 ? ?Transition of Care (TOC) CM/SW Contact  ?Vinie Sill, LCSW ?Phone Number: ?05/03/2021, 3:56 PM ? ?Clinical Narrative:    ? ?CSW met family at bedside. CSW introduced self and explained role. CSW received notice, family requested transportation resources- CSW provided requested information to family. ? ?Thurmond Butts, MSW, LCSW ?Clinical Social Worker ? ? ? ?Expected Discharge Plan: [place] ?Barriers to Discharge: Continued Medical Work up ? ?Expected Discharge Plan and Services ?Expected Discharge Plan: [place] ?  ?  ?  ?  ?                ?  ?  ?  ?  ?  ?  ?  ?  ?  ?  ? ? ?Social Determinants of Health (SDOH) Interventions ?  ? ?Readmission Risk Interventions ?No flowsheet data found. ? ?

## 2021-05-03 NOTE — Sedation Documentation (Signed)
Placed patient prone position with 3 staff members and patient assisted before patient was moved nurse noticed right flank area noted to have light purple ecchymosis.    ?

## 2021-05-03 NOTE — Procedures (Signed)
?  Procedure: [employer] renal biopsy   ?EBL:   minimal ?Complications:  none immediate ? ?See full dictation in [facility]. ? ?D. Arne Cleveland MD ?Main # [phone] ?Pager  [phone] ?Mobile [phone] ?  ? ?

## 2021-05-03 NOTE — Progress Notes (Signed)
[facility] ROUNDING NOTE   Subjective:   Interval History: 81 year old gentleman hypertension diabetes prostate cancer with surveillance.  Patient was admitted with increasing bilateral lower extremity swelling.  Hospital course complicated by new onset atrial fibrillation.  Large-volume paracentesis 8.3 L.  Was transferred to the ICU 04/25/2021 for CRRT-04/28/2021.  Plan is for renal biopsy 05/04/2021.  Transition to intermittent hemodialysis.  Last dialysis 05/01/2021.  Next dialysis treatment will be 05/04/2021.  Blood pressure 123/87 pulse 93 temperature 98.6 O2 sats 95% room air.   Sodium 132 potassium 3.9 chloride 97 CO2 27 BUN 28 creatinine 5 glucose 101 phosphorus 2.4 magnesium 2.1 albumin 2.5 hemoglobin 9.4.   Objective:  Vital signs in last 24 hours:  Temp:  [98.2 F (36.8 C)-98.9 F (37.2 C)] 98.6 F (37 C) (03/06 0452) Pulse Rate:  [79-93] 88 (03/06 0452) Resp:  [12-16] 13 (03/06 0452) BP: (100-123)/(65-87) 123/87 (03/06 0452) SpO2:  [95 %-98 %] 97 % (03/06 0452)  Weight change:  Filed Weights   05/01/21 0649 05/01/21 1115 05/02/21 0328  Weight: 116.5 kg 111.8 kg 117.7 kg    Intake/Output: I/O last 3 completed shifts: In: 20 [P.O.:240; Other:40] Out: 6283 [Urine:140; Other:4000]   Intake/Output this shift:  Total I/O In: -  Out: 100 [Urine:100]  Blind  CVS- RRR no murmurs rubs gallops RS- CTA no wheezes or rales ABD- BS present soft non-distended EXT-2-3+ lower extremity edema.   Basic Metabolic Panel: Recent Labs  Lab 04/29/21 0415 04/29/21 1829 04/30/21 0438 04/30/21 1630 05/01/21 0330 05/01/21 1600 05/02/21 1028 05/02/21 1600 05/03/21 0558  NA 134*   134*   < >  --  132* 133* 134*  --  133* 132*  K 4.7   4.7   < >  --  4.2 4.3 3.7  --  3.9 3.9  CL 99   99   < >  --  99 97* 98  --  98 97*  CO2 26   27   < >  --  '25 26 28  '$ --  28 27  GLUCOSE 114*   111*   < >  --  143* 98 123*  --  116* 101*  BUN 31*   31*   < >  --  28* 29* 18  --   26* 28*  CREATININE 4.96*   5.02*   < >  --  4.53* 4.86* 3.32*  --  4.51* 5.05*  CALCIUM 8.4*   8.4*   < >  --  8.1* 8.4* 8.2*  --  8.2* 8.2*  MG 2.5*  --  2.1  --  2.2  --  2.0  --  2.1  PHOS 2.7   2.7   < >  --  2.4* 2.8 2.0*  --  2.0* 2.4*   < > = values in this interval not displayed.    Liver Function Tests: Recent Labs  Lab 04/30/21 1630 05/01/21 0330 05/01/21 1600 05/02/21 1600 05/03/21 0558  ALBUMIN 2.6* 2.7* 2.7* 2.5* 2.5*   No results for input(s): LIPASE, AMYLASE in the last 168 hours. No results for input(s): AMMONIA in the last 168 hours.  CBC: Recent Labs  Lab 04/29/21 0415 04/30/21 0438 05/01/21 0330 05/02/21 1028 05/03/21 0558  WBC 5.9 6.0 6.0 6.7 8.1  HGB 10.7* 10.2* 10.0* 9.2* 9.4*  HCT 31.9* 29.7* 28.8* 27.8* 28.3*  MCV 94.4 93.1 94.7 95.2 95.6  PLT 125* 129* 142* 155 169    Cardiac Enzymes: Recent Labs  Lab  04/27/21 1408  CKTOTAL 70    BNP: Invalid input(s): POCBNP  CBG: Recent Labs  Lab 05/02/21 0612 05/02/21 1253 05/02/21 1708 05/02/21 2107 05/03/21 0615  GLUCAP 114* 149* 120* 138* 104*    Microbiology: Results for orders placed or performed during the hospital encounter of 04/23/21  Resp Panel by RT-PCR (Flu A&B, Covid) Nasopharyngeal Swab     Status: None   Collection Time: 04/23/21 12:23 PM   Specimen: Nasopharyngeal Swab; Nasopharyngeal(NP) swabs in vial transport medium  Result Value Ref Range Status   SARS Coronavirus 2 by RT PCR NEGATIVE NEGATIVE Final    Comment: (NOTE) SARS-CoV-2 target nucleic acids are NOT DETECTED.  The SARS-CoV-2 RNA is generally detectable in upper respiratory specimens during the acute phase of infection. The lowest concentration of SARS-CoV-2 viral copies this assay can detect is 138 copies/mL. A negative result does not preclude SARS-Cov-2 infection and should not be used as the sole basis for treatment or other patient management decisions. A negative result may occur with  improper  specimen collection/handling, submission of specimen other than nasopharyngeal swab, presence of viral mutation(s) within the areas targeted by this assay, and inadequate number of viral copies(<138 copies/mL). A negative result must be combined with clinical observations, patient history, and epidemiological information. The expected result is Negative.  Fact Sheet for Patients:  EntrepreneurPulse.com.au  Fact Sheet for Healthcare Providers:  IncredibleEmployment.be  This test is no t yet approved or cleared by the [place] FDA and  has been authorized for detection and/or diagnosis of SARS-CoV-2 by FDA under an Emergency Use Authorization (EUA). This EUA will remain  in effect (meaning this test can be used) for the duration of the COVID-19 declaration under Section 564(b)(1) of the Act, 21 U.S.C.section 360bbb-3(b)(1), unless the authorization is terminated  or revoked sooner.       Influenza A by PCR NEGATIVE NEGATIVE Final   Influenza B by PCR NEGATIVE NEGATIVE Final    Comment: (NOTE) The Xpert Xpress SARS-CoV-2/FLU/RSV plus assay is intended as an aid in the diagnosis of influenza from Nasopharyngeal swab specimens and should not be used as a sole basis for treatment. Nasal washings and aspirates are unacceptable for Xpert Xpress SARS-CoV-2/FLU/RSV testing.  Fact Sheet for Patients: EntrepreneurPulse.com.au  Fact Sheet for Healthcare Providers: IncredibleEmployment.be  This test is not yet approved or cleared by the [place] FDA and has been authorized for detection and/or diagnosis of SARS-CoV-2 by FDA under an Emergency Use Authorization (EUA). This EUA will remain in effect (meaning this test can be used) for the duration of the COVID-19 declaration under Section 564(b)(1) of the Act, 21 U.S.C. section 360bbb-3(b)(1), unless the authorization is terminated or revoked.  Performed at  [facility], [place] [address], [place], [place] [address]   MRSA Next Gen by PCR, Nasal     Status: None   Collection Time: 04/25/21 11:41 AM   Specimen: Nasal Mucosa; Nasal Swab  Result Value Ref Range Status   MRSA by PCR Next Gen NOT DETECTED NOT DETECTED Final    Comment: (NOTE) The GeneXpert MRSA Assay (FDA approved for NASAL specimens only), is one component of a comprehensive MRSA colonization surveillance program. It is not intended to diagnose MRSA infection nor to guide or monitor treatment for MRSA infections. Test performance is not FDA approved in patients less than 44 years old. Performed at [facility], [place] [address] [place], [place] [address]     Coagulation Studies: No results for input(s):  LABPROT, INR in the last 72 hours.  Urinalysis: No results for input(s): COLORURINE, LABSPEC, PHURINE, GLUCOSEU, HGBUR, BILIRUBINUR, KETONESUR, PROTEINUR, UROBILINOGEN, NITRITE, LEUKOCYTESUR in the last 72 hours.  Invalid input(s): APPERANCEUR    Imaging: No results found.   Medications:    sodium chloride Stopped (04/28/21 1032)    ceFAZolin (ANCEF) IV      atropine  1 drop Right Eye QHS   brimonidine  1 drop Left Eye TID   Chlorhexidine Gluconate Cloth  6 each Topical Q0600   dorzolamide-timolol  1 drop Left Eye BID   feeding supplement  237 mL Oral TID BM   latanoprost  1 drop Left Eye QHS   mouth rinse  15 mL Mouth Rinse BID   multivitamin with minerals  1 tablet Oral Daily   pantoprazole  40 mg Oral Daily   pilocarpine  1 drop Left Eye TID   polyethylene glycol  17 g Oral Daily   prednisoLONE acetate  1 drop Right Eye QHS   sodium chloride flush  10-40 mL Intracatheter Q12H   triamcinolone ointment  1 application Topical Daily   sodium chloride, acetaminophen, bisacodyl, heparin sodium (porcine), heparin, metoprolol tartrate, oxyCODONE, sodium chloride flush  Assessment/ Plan:  Dialysis dependent acute kidney injury of uncertain etiology.   Unresponsive to IV diuretics has received CRRT 04/25/2021-04/28/2021.  Transition to acute intermittent hemodialysis.  Next dialysis treatment will be 05/04/2021 Acute kidney injury uncertain etiology.  Renal biopsy to be performed 05/03/2021. Dialysis access.  Tunneled dialysis catheter. Hyperkalemia resolved Anemia mild Metabolic acidosis resolved Diabetic mellitus as per primary service Atrial fibrillation as per primary service   LOS: [place] '@TODAY''@6'$ :37 AM

## 2021-05-03 NOTE — Progress Notes (Signed)
Requested to see pt for possible out-pt HD needs. Met with pt, pt's wife, and pt's son at bedside. Introduced self and explained role. Advised pt/family that we can proceed with out-pt arrangements due to MD feeling pt will likely require HD at d/c but should pt not require HD at d/c, referral can be cancelled. Pt and pt's family agreeable. Pt and pt's family prefer [place] due to location to pt's home. Will proceed with referral to [facility] admissions this afternoon. Aware pt is being considered by CIR for rehab. Pt and pt's family request information on transportation services should pt require HD at d/c. Contacted TOC staff with pt's request. Will assist as needed.  ? ? ?Melven Sartorius ?Renal Navigator ?[phone] ?

## 2021-05-03 NOTE — Progress Notes (Signed)
Inpatient Rehab Admissions Coordinator:  ? ?Per therapy recommendations,  patient was screened for CIR candidacy by Leonilda Cozby, MS, CCC-SLP. At this time, Pt. Appears to be a a potential candidate for CIR. I will place   order for rehab consult per protocol for full assessment. Please contact me any with questions. ? ?Hilma Steinhilber, MS, CCC-SLP ?Rehab Admissions Coordinator  ?[phone] (celll) ?[phone] (office) ? ?

## 2021-05-03 NOTE — Progress Notes (Signed)
Occupational Therapy Treatment ?Patient Details ?Name: Austin Wheeler ?MRN: [id number] ?DOB: 02/17/1941 ?Today's Date: 05/03/2021 ? ? ?History of present illness 81 y/o male who presents on 04/23/21 with SOB, weakness and swelling abdomen/LEs. Found to have new onset A-fib, AKI injury of unknown origin started on HD, s/p paracentesis 2/24. Renal biopsy planned fof 05/03/21. PMH includes DM, HTN, legally blind. ?  ?OT comments ? Pt progressing towards established OT goals. Pt washing his face while at bed level with Supervision and cues for initiation. Pt requiring Mod-Max A +2 for bed mobility and then initial Max A for sitting balance. Pt able to transition to sitting at EOB with Min Guard A. Pt performing sit<>stand from EOB with Mod +2 and stedy. Continue to recmmend dc to AIR and will continue to follow acutely as admitted.   ? ?Recommendations for follow up therapy are one component of a multi-disciplinary discharge planning process, led by the attending physician.  Recommendations may be updated based on patient status, additional functional criteria and insurance authorization. ?   ?Follow Up Recommendations ? Acute inpatient rehab (3hours/day)  ?  ?Assistance Recommended at Discharge Frequent or constant Supervision/Assistance  ?Patient can return home with the following ? Two people to help with walking and/or transfers;Two people to help with bathing/dressing/bathroom;Assistance with cooking/housework;Help with stairs or ramp for entrance;Assist for transportation;Direct supervision/assist for financial management;Direct supervision/assist for medications management ?  ?Equipment Recommendations ? Wheelchair (measurements OT);Wheelchair cushion (measurements OT);Tub/shower seat;BSC/3in1;Hospital bed  ?  ?Recommendations for Other Services   ? ?  ?Precautions / Restrictions Precautions ?Precautions: Fall ?Precaution Comments: legally blind, A-fib ?Restrictions ?Weight Bearing Restrictions: No  ? ? ?  ? ?Mobility  Bed Mobility ?Overal bed mobility: Needs Assistance ?Bed Mobility: Rolling, Sidelying to Sit ?Rolling: Mod assist, +2 for physical assistance ?Sidelying to sit: Max assist, +2 for physical assistance ?  ?  ?  ?General bed mobility comments: Rolling to right/left for assist with bath/pericare, Max A of 2 to come to sitting, assist with trunk and scooting hips forward. ?  ? ?Transfers ?Overall transfer level: Needs assistance ?Equipment used: Ambulation equipment used ?Transfers: Sit to/from Stand ?Sit to Stand: Mod assist, +2 physical assistance ?  ?  ?  ?  ?  ?General transfer comment: Used stedy to stand from elevated bed height x1 from EOB, x1 from stedy seat. Cues for proper technique, anterior weight shift and upright ?  ?  ?Balance Overall balance assessment: Needs assistance ?Sitting-balance support: Feet supported, No upper extremity supported ?Sitting balance-Leahy Scale: Fair ?Sitting balance - Comments: Initially needing UE support on bedrail progressing to no UE support. ?  ?Standing balance support: During functional activity ?Standing balance-Leahy Scale: Poor ?Standing balance comment: Able to stand in stedy for a few mins with cues for hip extension/upright. ?  ?  ?  ?  ?  ?  ?  ?  ?  ?  ?  ?   ? ?ADL either performed or assessed with clinical judgement  ? ?ADL Overall ADL's : Needs assistance/impaired ?  ?  ?Grooming: Dance movement psychotherapist;Supervision/safety;Set up;Bed level ?Grooming Details (indicate cue type and reason): Providing wash clothe to pt in supine and then pt washign his face with increased time ?  ?  ?  ?  ?  ?  ?  ?  ?  ?  ?  ?  ?  ?  ?Functional mobility during ADLs: Moderate assistance;+2 for physical assistance (sit<>stand with stedy) ?General ADL Comments: Pt performing sit<>stand from EOB  with use of stedy and requiring Mod A +2. Decreased balance, strength, and activity tolerance ?  ? ?Extremity/Trunk Assessment Upper Extremity Assessment ?Upper Extremity Assessment: Generalized  weakness ?RUE Deficits / Details: pain to all pivots and increased edema to elbow ?RUE Sensation: WNL ?LUE Deficits / Details: pain to all pivots and increased edema to elbow ?LUE Sensation: WNL ?  ?Lower Extremity Assessment ?Lower Extremity Assessment: Defer to PT evaluation ?  ?  ?  ? ?Vision   ?  ?  ?Perception Perception ?Perception: Within Functional Limits ?  ?Praxis Praxis ?Praxis: Intact ?  ? ?Cognition Arousal/Alertness: Awake/alert ?Behavior During Therapy: [place] for tasks assessed/performed ?Overall Cognitive Status: Within Functional Limits for tasks assessed ?  ?  ?  ?  ?  ?  ?  ?  ?  ?  ?  ?  ?  ?  ?  ?  ?General Comments: Requiring increased time and cues ?  ?  ?   ?Exercises Exercises: General Upper Extremity ?General Exercises - Upper Extremity ?Elbow Flexion: AAROM, Both, 10 reps, Supine ?Elbow Extension: AAROM, Both, 10 reps, Supine ?Wrist Flexion: Both, 10 reps, Supine, AAROM ?Wrist Extension: Both, AAROM, 10 reps, Supine ?Digit Composite Flexion: Both, AROM, 10 reps, Supine ?Composite Extension: AROM, Both, 10 reps, Supine ? ?  ?Shoulder Instructions   ? ? ?  ?General Comments Wife present at end of session. VSS on RA.  ? ? ?Pertinent Vitals/ Pain       Pain Assessment ?Pain Assessment: Faces ?Faces Pain Scale: Hurts even more ?Breathing: normal ?Pain Location: BUEs at elbow/wrist, Rt hip ?Pain Descriptors / Indicators: Grimacing, Guarding, Discomfort, Sore, Aching ?Pain Intervention(s): Monitored during session, Limited activity within patient's tolerance, Repositioned ? ?Home Living   ?  ?  ?  ?  ?  ?  ?  ?  ?  ?  ?  ?  ?  ?  ?  ?  ?  ?  ? ?  ?Prior Functioning/Environment    ?  ?  ?  ?   ? ?Frequency ? Min 2X/week  ? ? ? ? ?  ?Progress Toward Goals ? ?OT Goals(current goals can now be found in the care plan section) ? Progress towards OT goals: Progressing toward goals ? ?Acute Rehab OT Goals ?OT Goal Formulation: With patient ?Time For Goal Achievement: 05/14/21 ?Potential to Achieve Goals:  Fair ?ADL Goals ?Pt Will Perform Grooming: with set-up;sitting ?Pt Will Perform Upper Body Bathing: with set-up;sitting ?Pt Will Perform Upper Body Dressing: with set-up;sitting ?Pt Will Transfer to Toilet: with mod assist;stand pivot transfer;bedside commode ?Pt/caregiver will Perform Home Exercise Program: Increased strength;Both right and left upper extremity;With minimal assist;With written HEP provided  ?Plan Discharge plan remains appropriate   ? ?Co-evaluation ? ? ? PT/OT/SLP Co-Evaluation/Treatment: Yes ?Reason for Co-Treatment: For patient/therapist safety;To address functional/ADL transfers ?PT goals addressed during session: Mobility/safety with mobility;Balance;Strengthening/ROM ?OT goals addressed during session: ADL's and self-care ?  ? ?  ?AM-PAC OT "6 Clicks" Daily Activity     ?Outcome Measure ? ? Help from another person eating meals?: A Little ?Help from another person taking care of personal grooming?: A Little ?Help from another person toileting, which includes using toliet, bedpan, or urinal?: A Lot ?Help from another person bathing (including washing, rinsing, drying)?: A Lot ?Help from another person to put on and taking off regular upper body clothing?: A Lot ?Help from another person to put on and taking off regular lower body clothing?: Total ?6 Click Score:  13 ? ?  ?End of Session Equipment Utilized During Treatment: Gait belt ? ?OT Visit Diagnosis: Unsteadiness on feet (R26.81);Muscle weakness (generalized) (M62.81);Pain ?Pain - part of body: Shoulder;Arm;Hand;Hip;Knee;Ankle and joints of foot ?  ?Activity Tolerance Patient tolerated treatment well ?  ?Patient Left in bed;with call bell/phone within reach;with family/visitor present;with bed alarm set ?  ?Nurse Communication Mobility status ?  ? ?   ? ?Time: 9528-4132 ?OT Time Calculation (min): 37 min ? ?Charges: OT General Charges ?$OT Visit: 1 Visit ?OT Treatments ?$Self Care/Home Management : 8-22 mins ? ?Jayda White MSOT,  OTR/L ?Acute Rehab ?Pager: [phone] ?Office: [phone] ? ?Elmo Shumard M Jarrette Dehner ?05/03/2021, 1:01 PM ? ? ?

## 2021-05-04 DIAGNOSIS — Z515 Encounter for palliative care: Secondary | ICD-10-CM | POA: Diagnosis not present

## 2021-05-04 DIAGNOSIS — N179 Acute kidney failure, unspecified: Secondary | ICD-10-CM | POA: Diagnosis not present

## 2021-05-04 DIAGNOSIS — Z66 Do not resuscitate: Secondary | ICD-10-CM | POA: Diagnosis not present

## 2021-05-04 DIAGNOSIS — R52 Pain, unspecified: Secondary | ICD-10-CM

## 2021-05-04 DIAGNOSIS — R531 Weakness: Secondary | ICD-10-CM | POA: Diagnosis not present

## 2021-05-04 LAB — GLUCOSE, CAPILLARY
Glucose-Capillary: 107 mg/dL — ABNORMAL HIGH (ref 70–99)
Glucose-Capillary: 118 mg/dL — ABNORMAL HIGH (ref 70–99)

## 2021-05-04 LAB — RENAL FUNCTION PANEL
Albumin: 2.4 g/dL — ABNORMAL LOW (ref 3.5–5.0)
Albumin: 2.4 g/dL — ABNORMAL LOW (ref 3.5–5.0)
Anion gap: 8 (ref 5–15)
Anion gap: 7 (ref 5–15)
BUN: 37 mg/dL — ABNORMAL HIGH (ref 8–23)
BUN: 24 mg/dL — ABNORMAL HIGH (ref 8–23)
CO2: 26 mmol/L (ref 22–32)
CO2: 27 mmol/L (ref 22–32)
Calcium: 8.2 mg/dL — ABNORMAL LOW (ref 8.9–10.3)
Calcium: 8.3 mg/dL — ABNORMAL LOW (ref 8.9–10.3)
Chloride: 95 mmol/L — ABNORMAL LOW (ref 98–111)
Chloride: 100 mmol/L (ref 98–111)
Creatinine, Ser: 5.75 mg/dL — ABNORMAL HIGH (ref 0.61–1.24)
Creatinine, Ser: 4.16 mg/dL — ABNORMAL HIGH (ref 0.61–1.24)
GFR, Estimated: 9 mL/min — ABNORMAL LOW (ref >60)
GFR, Estimated: 14 mL/min — ABNORMAL LOW (ref >60)
Glucose, Bld: 153 mg/dL — ABNORMAL HIGH (ref 70–99)
Glucose, Bld: 135 mg/dL — ABNORMAL HIGH (ref 70–99)
Phosphorus: 2.4 mg/dL — ABNORMAL LOW (ref 2.5–4.6)
Phosphorus: 1.8 mg/dL — ABNORMAL LOW (ref 2.5–4.6)
Potassium: 4 mmol/L (ref 3.5–5.1)
Potassium: 3.7 mmol/L (ref 3.5–5.1)
Sodium: 129 mmol/L — ABNORMAL LOW (ref 135–145)
Sodium: 134 mmol/L — ABNORMAL LOW (ref 135–145)

## 2021-05-04 LAB — CBC
HCT: 28 % — ABNORMAL LOW (ref 39.0–52.0)
Hemoglobin: 9.5 g/dL — ABNORMAL LOW (ref 13.0–17.0)
MCH: 32.1 pg (ref 26.0–34.0)
MCHC: 33.9 g/dL (ref 30.0–36.0)
MCV: 94.6 fL (ref 80.0–100.0)
Platelets: 164 10*3/uL (ref 150–400)
RBC: 2.96 MIL/uL — ABNORMAL LOW (ref 4.22–5.81)
RDW: 14.3 % (ref 11.5–15.5)
WBC: 6.1 10*3/uL (ref 4.0–10.5)
nRBC: 0 % (ref 0.0–0.2)

## 2021-05-04 LAB — MAGNESIUM: Magnesium: 2.2 mg/dL (ref 1.7–2.4)

## 2021-05-04 LAB — ANTINUCLEAR ANTIBODIES, IFA: ANA Ab, IFA: NEGATIVE

## 2021-05-04 NOTE — Progress Notes (Signed)
Patient ID: Austin Wheeler, male   DOB: Jul 24, 1940, 81 y.o.   MRN: [id number] ? ? ? ?Progress Note from the Palliative Medicine Team at [facility] ? ? ?Patient Name: Austin Wheeler        ?Date: 05/04/2021 ?DOB: June 25, 1940  Age: 81 y.o. MRN#: [id number] ?Attending Physician: Nolberto Hanlon, MD ?Primary Care Physician: Josetta Huddle, MD ?Admit Date: 04/23/2021 ? ? ?Medical records reviewed  ? 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 ? ?This NP visited patient at the bedside as a follow up for palliative medicine needs and emotional support, wife and son at the bedside.  Austin Wheeler alert and oriented and participated in today's conversation. ? ?Off CRRT, started  intermittent hemodialysis.  Several labs still pending.  Renal biopsy completed today Continues with severe anasarca, this is improving. ? ?Austin Wheeler today verbalizes frustration with overall loss of independence, vulnerability within the context of serious life limiting illness and feeling generally uncomfortable.  Today was a lot for him. ? ?Exploration of patient's means of coping in difficult situations.  He describes himself as type A and "wants things to be done right".  Education offered on the importance of discovering ways to cope with his current medical situation, and implementing interventions to increase his quality of life as he is in a vulnerable situation. ? 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  Education offered on the logistics of ongoing outpatient hemodialysis. ? ?Plan of care ?-DNR/DNI ?-Symptom management ?     -Pain- recommendation for preferred narcotic agents for pain are hydromorphone and  fentanyl ?-Patient and family are open to all offered and available medical interventions to prolong life.       All are hopeful for improvement ?-Hopeful for CIR for ongoing rehabilitation ? ? ?Discussed with patient the importance of continued conversation with his family and their  medical providers regarding overall plan of care and treatment options,  ensuring decisions are within the context of the patients values and GOCs. ? ?Questions and concerns addressed    ? ?PMT will continue to support holistically. ? ? ?Austin Lessen NP  ?Palliative Medicine Team Team Phone # [phone] ?Pager [phone] ?  ?

## 2021-05-04 NOTE — Progress Notes (Signed)
[facility] ROUNDING NOTE   Subjective:   Interval History: 81 year old gentleman hypertension diabetes prostate cancer with surveillance.  Patient was admitted with increasing bilateral lower extremity swelling.  Hospital course complicated by new onset atrial fibrillation.  Large-volume paracentesis 8.3 L.  Was transferred to the ICU 04/25/2021 for CRRT-04/28/2021.  Patient underwent renal biopsy 05/04/2021 transition to intermittent hemodialysis.  Last dialysis 05/01/2021.  Next dialysis treatment will be 05/04/2021.  Blood pressure 102/74 pulse 87 temperature 98.2 O2 sats 99%  Urine output slightly improved to 800 cc measured on 05/03/2021.  Sodium 129 potassium 4 chloride 95 CO2 26 BUN 37 creatinine 5.75 glucose 153 calcium 8.2 phosphorus 2.4 hemoglobin 9.5   Objective:  Vital signs in last 24 hours:  Temp:  [97.5 F (36.4 C)-98.2 F (36.8 C)] 98.2 F (36.8 C) (03/07 0449) Pulse Rate:  [84-108] 84 (03/07 0449) Resp:  [12-20] 18 (03/07 0449) BP: (100-140)/(73-95) 102/74 (03/07 0449) SpO2:  [94 %-100 %] 98 % (03/07 0449) Weight:  [117.7 kg] 117.7 kg (03/07 0449)  Weight change:  Filed Weights   05/01/21 1115 05/02/21 0328 05/04/21 0449  Weight: 111.8 kg 117.7 kg 117.7 kg    Intake/Output: I/O last 3 completed shifts: In: 20 [Other:20] Out: 800 [Urine:800]   Intake/Output this shift:  No intake/output data recorded.  Blind  CVS- RRR no murmurs rubs gallops RS- CTA no wheezes or rales ABD- BS present soft non-distended EXT-2-3+ lower extremity edema.   Basic Metabolic Panel: Recent Labs  Lab 04/30/21 0438 04/30/21 1630 05/01/21 0330 05/01/21 1600 05/02/21 1028 05/02/21 1600 05/03/21 0558 05/03/21 1633 05/04/21 0144  NA  --    < > 133* 134*  --  133* 132* 131* 129*  K  --    < > 4.3 3.7  --  3.9 3.9 4.1 4.0  CL  --    < > 97* 98  --  98 97* 98 95*  CO2  --    < > 26 28  --  '28 27 25 26  '$ GLUCOSE  --    < > 98 123*  --  116* 101* 152* 153*  BUN  --     < > 29* 18  --  26* 28* 34* 37*  CREATININE  --    < > 4.86* 3.32*  --  4.51* 5.05* 5.58* 5.75*  CALCIUM  --    < > 8.4* 8.2*  --  8.2* 8.2* 8.3* 8.2*  MG 2.1  --  2.2  --  2.0  --  2.1  --  2.2  PHOS  --    < > 2.8 2.0*  --  2.0* 2.4* 2.5 2.4*   < > = values in this interval not displayed.     Liver Function Tests: Recent Labs  Lab 05/01/21 1600 05/02/21 1600 05/03/21 0558 05/03/21 1633 05/04/21 0144  ALBUMIN 2.7* 2.5* 2.5* 2.5* 2.4*    No results for input(s): LIPASE, AMYLASE in the last 168 hours. No results for input(s): AMMONIA in the last 168 hours.  CBC: Recent Labs  Lab 04/30/21 0438 05/01/21 0330 05/02/21 1028 05/03/21 0558 05/04/21 0144  WBC 6.0 6.0 6.7 8.1 6.1  HGB 10.2* 10.0* 9.2* 9.4* 9.5*  HCT 29.7* 28.8* 27.8* 28.3* 28.0*  MCV 93.1 94.7 95.2 95.6 94.6  PLT 129* 142* 155 169 164     Cardiac Enzymes: Recent Labs  Lab 04/27/21 1408  CKTOTAL 70     BNP: Invalid input(s): POCBNP  CBG: Recent Labs  Lab 05/03/21 0615 05/03/21 1249 05/03/21 1638 05/03/21 2130 05/04/21 0617  GLUCAP 104* 105* 129* 153* 107*     Microbiology: Results for orders placed or performed during the hospital encounter of 04/23/21  Resp Panel by RT-PCR (Flu A&B, Covid) Nasopharyngeal Swab     Status: None   Collection Time: 04/23/21 12:23 PM   Specimen: Nasopharyngeal Swab; Nasopharyngeal(NP) swabs in vial transport medium  Result Value Ref Range Status   SARS Coronavirus 2 by RT PCR NEGATIVE NEGATIVE Final    Comment: (NOTE) SARS-CoV-2 target nucleic acids are NOT DETECTED.  The SARS-CoV-2 RNA is generally detectable in upper respiratory specimens during the acute phase of infection. The lowest concentration of SARS-CoV-2 viral copies this assay can detect is 138 copies/mL. A negative result does not preclude SARS-Cov-2 infection and should not be used as the sole basis for treatment or other patient management decisions. A negative result may occur with   improper specimen collection/handling, submission of specimen other than nasopharyngeal swab, presence of viral mutation(s) within the areas targeted by this assay, and inadequate number of viral copies(<138 copies/mL). A negative result must be combined with clinical observations, patient history, and epidemiological information. The expected result is Negative.  Fact Sheet for Patients:  EntrepreneurPulse.com.au  Fact Sheet for Healthcare Providers:  IncredibleEmployment.be  This test is no t yet approved or cleared by the [place] FDA and  has been authorized for detection and/or diagnosis of SARS-CoV-2 by FDA under an Emergency Use Authorization (EUA). This EUA will remain  in effect (meaning this test can be used) for the duration of the COVID-19 declaration under Section 564(b)(1) of the Act, 21 U.S.C.section 360bbb-3(b)(1), unless the authorization is terminated  or revoked sooner.       Influenza A by PCR NEGATIVE NEGATIVE Final   Influenza B by PCR NEGATIVE NEGATIVE Final    Comment: (NOTE) The Xpert Xpress SARS-CoV-2/FLU/RSV plus assay is intended as an aid in the diagnosis of influenza from Nasopharyngeal swab specimens and should not be used as a sole basis for treatment. Nasal washings and aspirates are unacceptable for Xpert Xpress SARS-CoV-2/FLU/RSV testing.  Fact Sheet for Patients: EntrepreneurPulse.com.au  Fact Sheet for Healthcare Providers: IncredibleEmployment.be  This test is not yet approved or cleared by the [place] FDA and has been authorized for detection and/or diagnosis of SARS-CoV-2 by FDA under an Emergency Use Authorization (EUA). This EUA will remain in effect (meaning this test can be used) for the duration of the COVID-19 declaration under Section 564(b)(1) of the Act, 21 U.S.C. section 360bbb-3(b)(1), unless the authorization is terminated  or revoked.  Performed at [facility], [place] [address] [place], [place] [address]   MRSA Next Gen by PCR, Nasal     Status: None   Collection Time: 04/25/21 11:41 AM   Specimen: Nasal Mucosa; Nasal Swab  Result Value Ref Range Status   MRSA by PCR Next Gen NOT DETECTED NOT DETECTED Final    Comment: (NOTE) The GeneXpert MRSA Assay (FDA approved for NASAL specimens only), is one component of a comprehensive MRSA colonization surveillance program. It is not intended to diagnose MRSA infection nor to guide or monitor treatment for MRSA infections. Test performance is not FDA approved in patients less than 72 years old. Performed at [facility], [place] [address] [place], [place] [address]     Coagulation Studies: No results for input(s): LABPROT, INR in the last 72 hours.  Urinalysis: No results for input(s): COLORURINE, LABSPEC, [place],  GLUCOSEU, HGBUR, BILIRUBINUR, KETONESUR, PROTEINUR, UROBILINOGEN, NITRITE, LEUKOCYTESUR in the last 72 hours.  Invalid input(s): APPERANCEUR    Imaging: IR Fluoro Guide CV Line Right  Result Date: 05/03/2021 CLINICAL DATA:  Diabetes, renal insufficiency, needs durable venous access for hemodialysis EXAM: TUNNELED HEMODIALYSIS CATHETER PLACEMENT WITH ULTRASOUND AND FLUOROSCOPIC GUIDANCE TECHNIQUE: The procedure, risks, benefits, and alternatives were explained to the patient. Questions regarding the procedure were encouraged and answered. The patient understands and consents to the procedure. antibioPatency of the right IJ vein was confirmed with ultrasound with image documentation. An appropriate skin site was determined. Region was prepped using maximum barrier technique including cap and mask, sterile gown, sterile gloves, large sterile sheet, and Chlorhexidine as cutaneous antisepsis. The region was infiltrated locally with 1% lidocaine. sedationUnder real-time ultrasound guidance, the right IJ vein was accessed with a 21 gauge  micropuncture needle; the needle tip within the vein was confirmed with ultrasound image documentation. Needle exchanged over the 018 guidewire for transitional dilator, which allowed advancement of a Benson wire into the IVC. Over this, an MPA catheter was advanced. A Palindrome 19 hemodialysis catheter was tunneled from the right anterior chest wall approach to the right IJ dermatotomy site. The MPA catheter was exchanged over an Amplatz wire for serial vascular dilators which allow placement of a peel-away sheath, through which the catheter was advanced under intermittent fluoroscopy, positioned with its tips in the proximal and midright atrium. Spot chest radiograph confirms good catheter position. No pneumothorax. Catheter was flushed and primed per protocol. Catheter secured externally with O Prolene sutures. The right IJ dermatotomy site was closed with Dermabond. COMPLICATIONS: COMPLICATIONS None immediate FLUOROSCOPY: Radiation Exposure Index (as provided by the fluoroscopic device): 4 mGy air Kerma COMPARISON:  None IMPRESSION: 1. Technically successful placement of tunneled right IJ hemodialysis catheter with ultrasound and fluoroscopic guidance. Ready for routine use. ACCESS: Remains approachable for percutaneous intervention as needed. Electronically Signed   By: Lucrezia Europe M.D.   On: 05/03/2021 16:14   IR US Guide Vasc Access Right  Result Date: 05/03/2021 CLINICAL DATA:  Diabetes, renal insufficiency, needs durable venous access for hemodialysis EXAM: TUNNELED HEMODIALYSIS CATHETER PLACEMENT WITH ULTRASOUND AND FLUOROSCOPIC GUIDANCE TECHNIQUE: The procedure, risks, benefits, and alternatives were explained to the patient. Questions regarding the procedure were encouraged and answered. The patient understands and consents to the procedure. antibioPatency of the right IJ vein was confirmed with ultrasound with image documentation. An appropriate skin site was determined. Region was prepped using  maximum barrier technique including cap and mask, sterile gown, sterile gloves, large sterile sheet, and Chlorhexidine as cutaneous antisepsis. The region was infiltrated locally with 1% lidocaine. sedationUnder real-time ultrasound guidance, the right IJ vein was accessed with a 21 gauge micropuncture needle; the needle tip within the vein was confirmed with ultrasound image documentation. Needle exchanged over the 018 guidewire for transitional dilator, which allowed advancement of a Benson wire into the IVC. Over this, an MPA catheter was advanced. A Palindrome 19 hemodialysis catheter was tunneled from the right anterior chest wall approach to the right IJ dermatotomy site. The MPA catheter was exchanged over an Amplatz wire for serial vascular dilators which allow placement of a peel-away sheath, through which the catheter was advanced under intermittent fluoroscopy, positioned with its tips in the proximal and midright atrium. Spot chest radiograph confirms good catheter position. No pneumothorax. Catheter was flushed and primed per protocol. Catheter secured externally with O Prolene sutures. The right IJ dermatotomy site was closed with Dermabond. COMPLICATIONS: COMPLICATIONS  None immediate FLUOROSCOPY: Radiation Exposure Index (as provided by the fluoroscopic device): 4 mGy air Kerma COMPARISON:  None IMPRESSION: 1. Technically successful placement of tunneled right IJ hemodialysis catheter with ultrasound and fluoroscopic guidance. Ready for routine use. ACCESS: Remains approachable for percutaneous intervention as needed. Electronically Signed   By: Lucrezia Europe M.D.   On: 05/03/2021 16:14   US BIOPSY (KIDNEY)  Result Date: 05/03/2021 CLINICAL DATA:  Renal insufficiency EXAM: ULTRASOUND GUIDED RENAL CORE BIOPSY COMPARISON:  CT 04/23/2021 TECHNIQUE: Survey ultrasound was performed and an appropriate skin entry site was localized. Site was marked, prepped with Betadine, draped in usual sterile fashion,  infiltrated locally with 1% lidocaine. Intravenous Fentanyl 2mg and Versed '1mg'$  were administered as conscious sedation during continuous monitoring of the patient's level of consciousness and physiological / cardiorespiratory status by the radiology RN, with a total moderate sedation time of 10 minutes. Under real time ultrasound guidance, a 15 gauge trocar needle was advanced to the margin of the lower pole of the left kidney for 3 coaxial 16 gauge core biopsy needle passes. The core samples were submitted to pathology. The patient tolerated procedure well. Complications: None immediate IMPRESSION: 1. Technically successful ultrasound-guided core renal biopsy , left lower pole. Electronically Signed   By: DLucrezia EuropeM.D.   On: 05/03/2021 15:33     Medications:    sodium chloride Stopped (04/28/21 1032)   sodium chloride     sodium chloride      aspirin EC  81 mg Oral Daily   atropine  1 drop Right Eye QHS   brimonidine  1 drop Left Eye TID   Chlorhexidine Gluconate Cloth  6 each Topical Q0600   Chlorhexidine Gluconate Cloth  6 each Topical Q0600   dorzolamide-timolol  1 drop Left Eye BID   feeding supplement  237 mL Oral TID BM   latanoprost  1 drop Left Eye QHS   mouth rinse  15 mL Mouth Rinse BID   multivitamin with minerals  1 tablet Oral Daily   pantoprazole  40 mg Oral Daily   pilocarpine  1 drop Left Eye TID   polyethylene glycol  17 g Oral Daily   prednisoLONE acetate  1 drop Right Eye QHS   sodium chloride flush  10-40 mL Intracatheter Q12H   triamcinolone ointment  1 application. 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  Unresponsive to IV diuretics has received CRRT  04/25/2021-04/28/2021.  Transition to acute intermittent hemodialysis.  Next dialysis treatment will be 05/04/2021.  We will continue to monitor for recovery Acute kidney injury uncertain etiology.  Renal biopsy   performed 05/03/2021. Dialysis access.  Tunneled dialysis catheter. Hyperkalemia resolved Anemia mild Metabolic acidosis resolved Diabetic mellitus as per primary service Atrial fibrillation as per primary service   LOS: 1[place]'@TODAY''@7'$ :09 AM

## 2021-05-04 NOTE — Progress Notes (Signed)
Inpatient Rehabilitation Admissions Coordinator  ? ?I met at bedside with patient and his wife. We discussed goals and expectations of a possible CIR admit.They prefer CIR rather than SNF. I await further therapy progress before pursuing Auth with [facility] for possible CIR admit. They are in agreement. ? ?Danne Baxter, RN, MSN ?Rehab Admissions Coordinator ?([phone] ?05/04/2021 12:47 PM ? ?

## 2021-05-04 NOTE — Progress Notes (Signed)
Foley cath discontinued with #16 latex replaced in sterile fashion. No urine output noted when new one placed. CHG bath given with pt. Appreciative of all help. Will continue to monitor ?

## 2021-05-04 NOTE — Progress Notes (Signed)
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   Patient started to have increasing bilateral lower extremities swelling about 3 months ago and was started of po Lasix 40 mg daily, swelling appears to be improving, patient however had episodes of feeling lightheadedness and "too much urination" and decided to cut down Lasix from 40 mg daily to 20 mg daily about 1 month ago. Condition remained stable since until about 1 week ago, patient started to notice swelling in his abdomen and legs, and decreased urine output.  Finally, for the past 3 days, there has been barely any urine came out. He said he feeling "bladder fullness" when having BM, but no urine came out. No abdominal pain, no back pain, no shortness of breath, no fever or chills. No Hx of CHF or stroke  2/24: Admitted to [facility] for acute on chronic renal failure, new onset a. Fib. Nephrology consulted; high dose Lasix trial initiated, large volume paracentesis of 8.3 L 2/26: PCCM consulted. Transfer to ICU for CRRT  2/27: tolerated CRRT 3/1 Heparin drip restarted 2/28 and patient has had recurrent hematuria and bleeding IV site. Heparin drip now on hold    3/3 TRH pickup  3/6 went for renal bx 3/7  HD today  Consultants:  Pccm, nephrology , IR  Procedures:   Antimicrobials:      Subjective: Patient seen in dialysis.  Has no dizziness, shortness of breath or chest pain.  Objective: Vitals:   05/04/21 1030 05/04/21 1100 05/04/21 1109 05/04/21 1143  BP: 108/85 105/84 105/84 118/80  Pulse:      Resp: '15 12 16 18  ' Temp:      TempSrc:      SpO2:   98%   Weight:   110.3 kg   Height:        Intake/Output Summary (Last 24  hours) at 05/04/2021 1332 Last data filed at 05/04/2021 1109 Gross per 24 hour  Intake 20 ml  Output 4000 ml  Net -3980 ml   Filed Weights   05/04/21 0449 05/04/21 0730 05/04/21 1109  Weight: 117.7 kg 114.3 kg 110.3 kg    Examination: Calm, NAD Cta no w/r Reg s1/s2 no gallop Soft benign +bs mildly distended +edema b/l x4 Aaoxox3  Mood and affect appropriate in current setting       Data Reviewed: I have personally reviewed following labs and imaging studies  CBC: Recent Labs  Lab 04/30/21 0438 05/01/21 0330 05/02/21 1028 05/03/21 0558 05/04/21 0144  WBC 6.0 6.0 6.7 8.1 6.1  HGB 10.2* 10.0* 9.2* 9.4* 9.5*  HCT 29.7* 28.8* 27.8* 28.3* 28.0*  MCV 93.1 94.7 95.2 95.6 94.6  PLT 129* 142* 155 169 116   Basic Metabolic Panel: Recent Labs  Lab 04/30/21 0438 04/30/21 1630 05/01/21 0330 05/01/21 1600 05/02/21 1028 05/02/21 1600 05/03/21 0558 05/03/21 1633 05/04/21 0144  NA  --    < > 133* 134*  --  133* 132* 131* 129*  K  --    < > 4.3 3.7  --  3.9 3.9 4.1 4.0  CL  --    < > 97* 98  --  98 97* 98 95*  CO2  --    < >  26 28  --  '28 27 25 26  ' GLUCOSE  --    < > 98 123*  --  116* 101* 152* 153*  BUN  --    < > 29* 18  --  26* 28* 34* 37*  CREATININE  --    < > 4.86* 3.32*  --  4.51* 5.05* 5.58* 5.75*  CALCIUM  --    < > 8.4* 8.2*  --  8.2* 8.2* 8.3* 8.2*  MG 2.1  --  2.2  --  2.0  --  2.1  --  2.2  PHOS  --    < > 2.8 2.0*  --  2.0* 2.4* 2.5 2.4*   < > = values in this interval not displayed.   GFR: Estimated Creatinine Clearance: 14.6 mL/min (A) (by C-G formula based on SCr of 5.75 mg/dL (H)). Liver Function Tests: Recent Labs  Lab 05/01/21 1600 05/02/21 1600 05/03/21 0558 05/03/21 1633 05/04/21 0144  ALBUMIN 2.7* 2.5* 2.5* 2.5* 2.4*   No results for input(s): LIPASE, AMYLASE in the last 168 hours. No results for input(s): AMMONIA in the last 168 hours. Coagulation Profile: Recent Labs  Lab 04/29/21 0415  INR 1.4*   Cardiac Enzymes: Recent Labs   Lab 04/27/21 1408  CKTOTAL 70   BNP (last 3 results) No results for input(s): PROBNP in the last 8760 hours. HbA1C: No results for input(s): HGBA1C in the last 72 hours. CBG: Recent Labs  Lab 05/03/21 0615 05/03/21 1249 05/03/21 1638 05/03/21 2130 05/04/21 0617  GLUCAP 104* 105* 129* 153* 107*   Lipid Profile: No results for input(s): CHOL, HDL, LDLCALC, TRIG, CHOLHDL, LDLDIRECT in the last 72 hours. Thyroid Function Tests: No results for input(s): TSH, T4TOTAL, FREET4, T3FREE, THYROIDAB in the last 72 hours. Anemia Panel: No results for input(s): VITAMINB12, FOLATE, FERRITIN, TIBC, IRON, RETICCTPCT in the last 72 hours. Sepsis Labs: No results for input(s): PROCALCITON, LATICACIDVEN in the last 168 hours.   Recent Results (from the past 240 hour(s))  MRSA Next Gen by PCR, Nasal     Status: None   Collection Time: 04/25/21 11:41 AM   Specimen: Nasal Mucosa; Nasal Swab  Result Value Ref Range Status   MRSA by PCR Next Gen NOT DETECTED NOT DETECTED Final    Comment: (NOTE) The GeneXpert MRSA Assay (FDA approved for NASAL specimens only), is one component of a comprehensive MRSA colonization surveillance program. It is not intended to diagnose MRSA infection nor to guide or monitor treatment for MRSA infections. Test performance is not FDA approved in patients less than 34 years old. Performed at [facility], [place] [address], [place], [place] [address]          Radiology Studies: IR Fluoro Guide CV Line Right  Result Date: 05/03/2021 CLINICAL DATA:  Diabetes, renal insufficiency, needs durable venous access for hemodialysis EXAM: TUNNELED HEMODIALYSIS CATHETER PLACEMENT WITH ULTRASOUND AND FLUOROSCOPIC GUIDANCE TECHNIQUE: The procedure, risks, benefits, and alternatives were explained to the patient. Questions regarding the procedure were encouraged and answered. The patient understands and consents to the procedure. antibioPatency of the right IJ vein was  confirmed with ultrasound with image documentation. An appropriate skin site was determined. Region was prepped using maximum barrier technique including cap and mask, sterile gown, sterile gloves, large sterile sheet, and Chlorhexidine as cutaneous antisepsis. The region was infiltrated locally with 1% lidocaine. sedationUnder real-time ultrasound guidance, the right IJ vein was accessed with a 21 gauge micropuncture needle; the needle tip within the  vein was confirmed with ultrasound image documentation. Needle exchanged over the 018 guidewire for transitional dilator, which allowed advancement of a Benson wire into the IVC. Over this, an MPA catheter was advanced. A Palindrome 19 hemodialysis catheter was tunneled from the right anterior chest wall approach to the right IJ dermatotomy site. The MPA catheter was exchanged over an Amplatz wire for serial vascular dilators which allow placement of a peel-away sheath, through which the catheter was advanced under intermittent fluoroscopy, positioned with its tips in the proximal and midright atrium. Spot chest radiograph confirms good catheter position. No pneumothorax. Catheter was flushed and primed per protocol. Catheter secured externally with O Prolene sutures. The right IJ dermatotomy site was closed with Dermabond. COMPLICATIONS: COMPLICATIONS None immediate FLUOROSCOPY: Radiation Exposure Index (as provided by the fluoroscopic device): 4 mGy air Kerma COMPARISON:  None IMPRESSION: 1. Technically successful placement of tunneled right IJ hemodialysis catheter with ultrasound and fluoroscopic guidance. Ready for routine use. ACCESS: Remains approachable for percutaneous intervention as needed. Electronically Signed   By: Lucrezia Europe M.D.   On: 05/03/2021 16:14   IR US Guide Vasc Access Right  Result Date: 05/03/2021 CLINICAL DATA:  Diabetes, renal insufficiency, needs durable venous access for hemodialysis EXAM: TUNNELED HEMODIALYSIS CATHETER PLACEMENT WITH  ULTRASOUND AND FLUOROSCOPIC GUIDANCE TECHNIQUE: The procedure, risks, benefits, and alternatives were explained to the patient. Questions regarding the procedure were encouraged and answered. The patient understands and consents to the procedure. antibioPatency of the right IJ vein was confirmed with ultrasound with image documentation. An appropriate skin site was determined. Region was prepped using maximum barrier technique including cap and mask, sterile gown, sterile gloves, large sterile sheet, and Chlorhexidine as cutaneous antisepsis. The region was infiltrated locally with 1% lidocaine. sedationUnder real-time ultrasound guidance, the right IJ vein was accessed with a 21 gauge micropuncture needle; the needle tip within the vein was confirmed with ultrasound image documentation. Needle exchanged over the 018 guidewire for transitional dilator, which allowed advancement of a Benson wire into the IVC. Over this, an MPA catheter was advanced. A Palindrome 19 hemodialysis catheter was tunneled from the right anterior chest wall approach to the right IJ dermatotomy site. The MPA catheter was exchanged over an Amplatz wire for serial vascular dilators which allow placement of a peel-away sheath, through which the catheter was advanced under intermittent fluoroscopy, positioned with its tips in the proximal and midright atrium. Spot chest radiograph confirms good catheter position. No pneumothorax. Catheter was flushed and primed per protocol. Catheter secured externally with O Prolene sutures. The right IJ dermatotomy site was closed with Dermabond. COMPLICATIONS: COMPLICATIONS None immediate FLUOROSCOPY: Radiation Exposure Index (as provided by the fluoroscopic device): 4 mGy air Kerma COMPARISON:  None IMPRESSION: 1. Technically successful placement of tunneled right IJ hemodialysis catheter with ultrasound and fluoroscopic guidance. Ready for routine use. ACCESS: Remains approachable for percutaneous  intervention as needed. Electronically Signed   By: Lucrezia Europe M.D.   On: 05/03/2021 16:14   US BIOPSY (KIDNEY)  Result Date: 05/03/2021 CLINICAL DATA:  Renal insufficiency EXAM: ULTRASOUND GUIDED RENAL CORE BIOPSY COMPARISON:  CT 04/23/2021 TECHNIQUE: Survey ultrasound was performed and an appropriate skin entry site was localized. Site was marked, prepped with Betadine, draped in usual sterile fashion, infiltrated locally with 1% lidocaine. Intravenous Fentanyl 56mg and Versed 118mwere administered as conscious sedation during continuous monitoring of the patient's level of consciousness and physiological / cardiorespiratory status by the radiology RN, with a total moderate sedation time of 10 minutes.  Under real time ultrasound guidance, a 15 gauge trocar needle was advanced to the margin of the lower pole of the left kidney for 3 coaxial 16 gauge core biopsy needle passes. The core samples were submitted to pathology. The patient tolerated procedure well. Complications: None immediate IMPRESSION: 1. Technically successful ultrasound-guided core renal biopsy , left lower pole. Electronically Signed   By: Lucrezia Europe M.D.   On: 05/03/2021 15:33        Scheduled Meds:  aspirin EC  81 mg Oral Daily   atropine  1 drop Right Eye QHS   brimonidine  1 drop Left Eye TID   Chlorhexidine Gluconate Cloth  6 each Topical Q0600   Chlorhexidine Gluconate Cloth  6 each Topical Q0600   dorzolamide-timolol  1 drop Left Eye BID   feeding supplement  237 mL Oral TID BM   latanoprost  1 drop Left Eye QHS   mouth rinse  15 mL Mouth Rinse BID   multivitamin with minerals  1 tablet Oral Daily   pantoprazole  40 mg Oral Daily   pilocarpine  1 drop Left Eye TID   polyethylene glycol  17 g Oral Daily   prednisoLONE acetate  1 drop Right Eye QHS   sodium chloride flush  10-40 mL Intracatheter Q12H   triamcinolone ointment  1 application. Topical Daily   Continuous Infusions:  sodium chloride Stopped (04/28/21  1032)    Assessment & Plan:   Principal Problem:   AKI (acute kidney injury) ([place]) Active Problems:   Pressure injury of skin   Protein-calorie malnutrition, severe   Acute Kidney Failure with Anuria requiring RRT Anasarca -Patient with acute kidney injury of unclear origin. Differential includes ATN versus nephrotic syndrome versus acute GN given hematuria. -serologies for acute GN thus far negative, hep b/c negative, ANA, unable thus far to obtain urine sample for multiple myeloma work up (temp HD line placed) Negative renal US Failed diuresis, started on CRRT 2/26-3/1, HD#1 04/29/21 Nephrology following 3/7 status post renal biopsy on 3/6  In dialysis today  Transitioning to intermittent hemodialysis  -Foley changed yesterday due to bladder scan revealing >400 cc  Continue to monitor for recovery        New onset atrial fibrillation Rate control with metoprolol.   -CHADVASC score of 4 (age, HTN, DM), initially holding anticoagulation in setting of gross hematuria.  3/7 bleeding with heparin drip.  Holding it.   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 3/7 continue home ophthalmic meds    Hyponatremia Likley secondary to volume overload Getting HD today Monitor levels Asymptomatic currently   Thrombocytopenia.  Resolved   Type 2 DM with Hypoglycemia. Hypoglycemia resolved.  CBGs within goal.  3/3 continue to monitor, add R-ISS when appropriate   Hyperkalemia Resolved with CRRT  Metabolic acidosis  Resolved, CTM    Please note:  Medication Issues; Preferred narcotic agents for pain control are hydromorphone, fentanyl, and methadone. Morphine should not be used.  Baclofen should be avoided Avoid oral sodium phosphate and magnesium citrate based laxatives / bowel preps     DVT prophylaxis: scd Code Status: DNR Family Communication: None at  bedside Disposition Plan: CIR Status is: Inpatient Remains inpatient appropriate because: IV treatment, getting HD, CIR pending                LOS: 11 days   Time spent: 4mnutes  with more than 50% on Corn, MD [facility] Pager [phone]  If 7PM-7AM, please contact night-coverage 05/04/2021, 1:32 PM

## 2021-05-05 ENCOUNTER — Other Ambulatory Visit (HOSPITAL_COMMUNITY): Payer: Self-pay

## 2021-05-05 DIAGNOSIS — N179 Acute kidney failure, unspecified: Secondary | ICD-10-CM | POA: Diagnosis not present

## 2021-05-05 LAB — RENAL FUNCTION PANEL
Albumin: 2.4 g/dL — ABNORMAL LOW (ref 3.5–5.0)
Albumin: 2.4 g/dL — ABNORMAL LOW (ref 3.5–5.0)
Anion gap: 8 (ref 5–15)
Anion gap: 8 (ref 5–15)
BUN: 27 mg/dL — ABNORMAL HIGH (ref 8–23)
BUN: 30 mg/dL — ABNORMAL HIGH (ref 8–23)
CO2: 26 mmol/L (ref 22–32)
CO2: 27 mmol/L (ref 22–32)
Calcium: 8.2 mg/dL — ABNORMAL LOW (ref 8.9–10.3)
Calcium: 8.2 mg/dL — ABNORMAL LOW (ref 8.9–10.3)
Chloride: 96 mmol/L — ABNORMAL LOW (ref 98–111)
Chloride: 98 mmol/L (ref 98–111)
Creatinine, Ser: 4.61 mg/dL — ABNORMAL HIGH (ref 0.61–1.24)
Creatinine, Ser: 5.25 mg/dL — ABNORMAL HIGH (ref 0.61–1.24)
GFR, Estimated: 12 mL/min — ABNORMAL LOW (ref >60)
GFR, Estimated: 10 mL/min — ABNORMAL LOW (ref >60)
Glucose, Bld: 115 mg/dL — ABNORMAL HIGH (ref 70–99)
Glucose, Bld: 133 mg/dL — ABNORMAL HIGH (ref 70–99)
Phosphorus: 2 mg/dL — ABNORMAL LOW (ref 2.5–4.6)
Phosphorus: 2 mg/dL — ABNORMAL LOW (ref 2.5–4.6)
Potassium: 3.7 mmol/L (ref 3.5–5.1)
Potassium: 4 mmol/L (ref 3.5–5.1)
Sodium: 130 mmol/L — ABNORMAL LOW (ref 135–145)
Sodium: 133 mmol/L — ABNORMAL LOW (ref 135–145)

## 2021-05-05 LAB — GLUCOSE, CAPILLARY
Glucose-Capillary: 91 mg/dL (ref 70–99)
Glucose-Capillary: 114 mg/dL — ABNORMAL HIGH (ref 70–99)
Glucose-Capillary: 126 mg/dL — ABNORMAL HIGH (ref 70–99)
Glucose-Capillary: 143 mg/dL — ABNORMAL HIGH (ref 70–99)

## 2021-05-05 LAB — MAGNESIUM: Magnesium: 2.1 mg/dL (ref 1.7–2.4)

## 2021-05-05 MED ORDER — SENNOSIDES-DOCUSATE SODIUM 8.6-50 MG PO TABS
1.0000 | ORAL_TABLET | Freq: Two times a day (BID) | ORAL | Status: DC
  Administered 2021-05-05 – 2021-05-10 (×10): 1 via ORAL
  Filled 2021-05-05 (×14): qty 1

## 2021-05-05 MED ORDER — CHLORHEXIDINE GLUCONATE CLOTH 2 % EX PADS
6.0000 | MEDICATED_PAD | Freq: Every day | CUTANEOUS | Status: DC
  Administered 2021-05-05 – 2021-05-14 (×9): 6 via TOPICAL

## 2021-05-05 MED ORDER — APIXABAN 2.5 MG PO TABS
2.5000 mg | ORAL_TABLET | Freq: Two times a day (BID) | ORAL | Status: DC
  Administered 2021-05-05 – 2021-05-10 (×12): 2.5 mg via ORAL
  Filled 2021-05-05 (×13): qty 1

## 2021-05-05 MED ORDER — POLYETHYLENE GLYCOL 3350 17 G PO PACK
17.0000 g | PACK | Freq: Two times a day (BID) | ORAL | Status: DC
  Administered 2021-05-05 – 2021-05-10 (×5): 17 g via ORAL
  Filled 2021-05-05 (×11): qty 1

## 2021-05-05 NOTE — Progress Notes (Signed)
Inpatient Rehabilitation Admissions Coordinator  ? ?Noted patient only able to do bed level exercises with therapy today. I am unable to begin Auth with [facility] for possible Cir until able to participate further with therapy intensity. I will follow. ? ?Danne Baxter, RN, MSN ?Rehab Admissions Coordinator ?[phone] ?05/05/2021 2:58 PM ? ?

## 2021-05-05 NOTE — Progress Notes (Addendum)
Contacted [facility] to see if clinic has any availability to accept pt due to pt's preference. [facility] is currently full. Pt has been clipped to [facility] but awaiting final clearance. Contacted [facility] to confirm schedule prior to discussing with pt/family. Awaiting clearance and schedule. Aware pt being followed by CIR. Will assist as needed.  ? ?Melven Sartorius ?Renal Navigator ?[phone] ? ? ?Addendum at 3:46 pm: ?Spoke to [place] at [facility] who confirms pt's schedule of TTS 12:30 chair time. Pt will need to arrive at 11:30 for first appointment. Melissa aware pt's start date is to be determined. Pt is currently being considered by CIR. Met with pt at bedside. Discussed out-pt schedule and clinic. Pt aware that [facility] is currently full and unable to accept pt at this time but if they have an opening prior to pt's d/c, pt could be considered at that time. Pt voices understanding and agreeable to plan. Schedule letter provided to pt and left on window ledge for wife to review tomorrow when she visits. Offered to contact pt's wife to discuss details but pt states he will discuss with her. Will need to contact clinic with start date once d/c date is known. Will assist as needed.  ?

## 2021-05-05 NOTE — Progress Notes (Signed)
[facility] ROUNDING NOTE   Subjective:   Interval History: 81 year old gentleman hypertension diabetes prostate cancer with surveillance.  Patient was admitted with increasing bilateral lower extremity swelling.  Hospital course complicated by new onset atrial fibrillation.  Large-volume paracentesis 8.3 L.  Was transferred to the ICU 04/25/2021 for CRRT-04/28/2021.  Patient underwent renal biopsy 05/04/2021 transition to intermittent hemodialysis.  Last dialysis 05/04/2021 with 4 L removed.  Next dialysis be 05/06/2021  Blood pressure 125/82 pulse 92 temperature afebrile O2 sats 100  Urine output slightly improved to 800 cc measured on 05/03/2021.  Sodium 130 potassium 3.7 chloride 96 CO2 26 BUN 27 creatinine 4.61 glucose 115 calcium 8.2 phosphorus 2 magnesium 2.1 hemoglobin 11.5   Objective:  Vital signs in last 24 hours:  Temp:  [98.1 F (36.7 C)-99 F (37.2 C)] 98.2 F (36.8 C) (03/07 2319) Pulse Rate:  [91-93] 92 (03/08 0619) Resp:  [12-21] 15 (03/08 0619) BP: (94-125)/(63-85) 125/83 (03/08 0619) SpO2:  [96 %-100 %] 96 % (03/08 0619) Weight:  [110.2 kg-114.3 kg] 110.2 kg (03/08 0619)  Weight change: -3.4 kg Filed Weights   05/04/21 0730 05/04/21 1109 05/05/21 0619  Weight: 114.3 kg 110.3 kg 110.2 kg    Intake/Output: I/O last 3 completed shifts: In: -  Out: 4050 [Urine:50; Other:4000]   Intake/Output this shift:  No intake/output data recorded.  Blind  CVS- RRR no murmurs rubs gallops RS- CTA no wheezes or rales ABD- BS present soft non-distended EXT-2-3+ lower extremity edema.   Basic Metabolic Panel: Recent Labs  Lab 05/01/21 0330 05/01/21 1600 05/02/21 1028 05/02/21 1600 05/03/21 0558 05/03/21 1633 05/04/21 0144 05/04/21 1614 05/05/21 0134  NA 133*   < >  --    < > 132* 131* 129* 134* 130*  K 4.3   < >  --    < > 3.9 4.1 4.0 3.7 3.7  CL 97*   < >  --    < > 97* 98 95* 100 96*  CO2 26   < >  --    < > '27 25 26 27 26  '$ GLUCOSE 98   < >  --    <  > 101* 152* 153* 135* 115*  BUN 29*   < >  --    < > 28* 34* 37* 24* 27*  CREATININE 4.86*   < >  --    < > 5.05* 5.58* 5.75* 4.16* 4.61*  CALCIUM 8.4*   < >  --    < > 8.2* 8.3* 8.2* 8.3* 8.2*  MG 2.2  --  2.0  --  2.1  --  2.2  --  2.1  PHOS 2.8   < >  --    < > 2.4* 2.5 2.4* 1.8* 2.0*   < > = values in this interval not displayed.     Liver Function Tests: Recent Labs  Lab 05/03/21 0558 05/03/21 1633 05/04/21 0144 05/04/21 1614 05/05/21 0134  ALBUMIN 2.5* 2.5* 2.4* 2.4* 2.4*    No results for input(s): LIPASE, AMYLASE in the last 168 hours. No results for input(s): AMMONIA in the last 168 hours.  CBC: Recent Labs  Lab 04/30/21 0438 05/01/21 0330 05/02/21 1028 05/03/21 0558 05/04/21 0144  WBC 6.0 6.0 6.7 8.1 6.1  HGB 10.2* 10.0* 9.2* 9.4* 9.5*  HCT 29.7* 28.8* 27.8* 28.3* 28.0*  MCV 93.1 94.7 95.2 95.6 94.6  PLT 129* 142* 155 169 164     Cardiac Enzymes: No results for input(s): CKTOTAL, CKMB,  CKMBINDEX, TROPONINI in the last 168 hours.   BNP: Invalid input(s): POCBNP  CBG: Recent Labs  Lab 05/03/21 1638 05/03/21 2130 05/04/21 0617 05/04/21 2116 05/05/21 0618  GLUCAP 129* 153* 107* 118* 91     Microbiology: Results for orders placed or performed during the hospital encounter of 04/23/21  Resp Panel by RT-PCR (Flu A&B, Covid) Nasopharyngeal Swab     Status: None   Collection Time: 04/23/21 12:23 PM   Specimen: Nasopharyngeal Swab; Nasopharyngeal(NP) swabs in vial transport medium  Result Value Ref Range Status   SARS Coronavirus 2 by RT PCR NEGATIVE NEGATIVE Final    Comment: (NOTE) SARS-CoV-2 target nucleic acids are NOT DETECTED.  The SARS-CoV-2 RNA is generally detectable in upper respiratory specimens during the acute phase of infection. The lowest concentration of SARS-CoV-2 viral copies this assay can detect is 138 copies/mL. A negative result does not preclude SARS-Cov-2 infection and should not be used as the sole basis for treatment  or other patient management decisions. A negative result may occur with  improper specimen collection/handling, submission of specimen other than nasopharyngeal swab, presence of viral mutation(s) within the areas targeted by this assay, and inadequate number of viral copies(<138 copies/mL). A negative result must be combined with clinical observations, patient history, and epidemiological information. The expected result is Negative.  Fact Sheet for Patients:  EntrepreneurPulse.com.au  Fact Sheet for Healthcare Providers:  IncredibleEmployment.be  This test is no t yet approved or cleared by the [place] FDA and  has been authorized for detection and/or diagnosis of SARS-CoV-2 by FDA under an Emergency Use Authorization (EUA). This EUA will remain  in effect (meaning this test can be used) for the duration of the COVID-19 declaration under Section 564(b)(1) of the Act, 21 U.S.C.section 360bbb-3(b)(1), unless the authorization is terminated  or revoked sooner.       Influenza A by PCR NEGATIVE NEGATIVE Final   Influenza B by PCR NEGATIVE NEGATIVE Final    Comment: (NOTE) The Xpert Xpress SARS-CoV-2/FLU/RSV plus assay is intended as an aid in the diagnosis of influenza from Nasopharyngeal swab specimens and should not be used as a sole basis for treatment. Nasal washings and aspirates are unacceptable for Xpert Xpress SARS-CoV-2/FLU/RSV testing.  Fact Sheet for Patients: EntrepreneurPulse.com.au  Fact Sheet for Healthcare Providers: IncredibleEmployment.be  This test is not yet approved or cleared by the [place] FDA and has been authorized for detection and/or diagnosis of SARS-CoV-2 by FDA under an Emergency Use Authorization (EUA). This EUA will remain in effect (meaning this test can be used) for the duration of the COVID-19 declaration under Section 564(b)(1) of the Act, 21 U.S.C. section  360bbb-3(b)(1), unless the authorization is terminated or revoked.  Performed at [facility], [place] [address], [place], [place] [address]   MRSA Next Gen by PCR, Nasal     Status: None   Collection Time: 04/25/21 11:41 AM   Specimen: Nasal Mucosa; Nasal Swab  Result Value Ref Range Status   MRSA by PCR Next Gen NOT DETECTED NOT DETECTED Final    Comment: (NOTE) The GeneXpert MRSA Assay (FDA approved for NASAL specimens only), is one component of a comprehensive MRSA colonization surveillance program. It is not intended to diagnose MRSA infection nor to guide or monitor treatment for MRSA infections. Test performance is not FDA approved in patients less than 58 years old. Performed at [facility], [place] [address], [place],  [address]     Coagulation Studies: No results  for input(s): LABPROT, INR in the last 72 hours.  Urinalysis: No results for input(s): COLORURINE, LABSPEC, PHURINE, GLUCOSEU, HGBUR, BILIRUBINUR, KETONESUR, PROTEINUR, UROBILINOGEN, NITRITE, LEUKOCYTESUR in the last 72 hours.  Invalid input(s): APPERANCEUR    Imaging: IR Fluoro Guide CV Line Right  Result Date: 05/03/2021 CLINICAL DATA:  Diabetes, renal insufficiency, needs durable venous access for hemodialysis EXAM: TUNNELED HEMODIALYSIS CATHETER PLACEMENT WITH ULTRASOUND AND FLUOROSCOPIC GUIDANCE TECHNIQUE: The procedure, risks, benefits, and alternatives were explained to the patient. Questions regarding the procedure were encouraged and answered. The patient understands and consents to the procedure. antibioPatency of the right IJ vein was confirmed with ultrasound with image documentation. An appropriate skin site was determined. Region was prepped using maximum barrier technique including cap and mask, sterile gown, sterile gloves, large sterile sheet, and Chlorhexidine as cutaneous antisepsis. The region was infiltrated locally with 1% lidocaine. sedationUnder real-time ultrasound guidance,  the right IJ vein was accessed with a 21 gauge micropuncture needle; the needle tip within the vein was confirmed with ultrasound image documentation. Needle exchanged over the 018 guidewire for transitional dilator, which allowed advancement of a Benson wire into the IVC. Over this, an MPA catheter was advanced. A Palindrome 19 hemodialysis catheter was tunneled from the right anterior chest wall approach to the right IJ dermatotomy site. The MPA catheter was exchanged over an Amplatz wire for serial vascular dilators which allow placement of a peel-away sheath, through which the catheter was advanced under intermittent fluoroscopy, positioned with its tips in the proximal and midright atrium. Spot chest radiograph confirms good catheter position. No pneumothorax. Catheter was flushed and primed per protocol. Catheter secured externally with O Prolene sutures. The right IJ dermatotomy site was closed with Dermabond. COMPLICATIONS: COMPLICATIONS None immediate FLUOROSCOPY: Radiation Exposure Index (as provided by the fluoroscopic device): 4 mGy air Kerma COMPARISON:  None IMPRESSION: 1. Technically successful placement of tunneled right IJ hemodialysis catheter with ultrasound and fluoroscopic guidance. Ready for routine use. ACCESS: Remains approachable for percutaneous intervention as needed. Electronically Signed   By: Lucrezia Europe M.D.   On: 05/03/2021 16:14   IR US Guide Vasc Access Right  Result Date: 05/03/2021 CLINICAL DATA:  Diabetes, renal insufficiency, needs durable venous access for hemodialysis EXAM: TUNNELED HEMODIALYSIS CATHETER PLACEMENT WITH ULTRASOUND AND FLUOROSCOPIC GUIDANCE TECHNIQUE: The procedure, risks, benefits, and alternatives were explained to the patient. Questions regarding the procedure were encouraged and answered. The patient understands and consents to the procedure. antibioPatency of the right IJ vein was confirmed with ultrasound with image documentation. An appropriate skin  site was determined. Region was prepped using maximum barrier technique including cap and mask, sterile gown, sterile gloves, large sterile sheet, and Chlorhexidine as cutaneous antisepsis. The region was infiltrated locally with 1% lidocaine. sedationUnder real-time ultrasound guidance, the right IJ vein was accessed with a 21 gauge micropuncture needle; the needle tip within the vein was confirmed with ultrasound image documentation. Needle exchanged over the 018 guidewire for transitional dilator, which allowed advancement of a Benson wire into the IVC. Over this, an MPA catheter was advanced. A Palindrome 19 hemodialysis catheter was tunneled from the right anterior chest wall approach to the right IJ dermatotomy site. The MPA catheter was exchanged over an Amplatz wire for serial vascular dilators which allow placement of a peel-away sheath, through which the catheter was advanced under intermittent fluoroscopy, positioned with its tips in the proximal and midright atrium. Spot chest radiograph confirms good catheter position. No pneumothorax. Catheter was flushed and primed per protocol.  Catheter secured externally with O Prolene sutures. The right IJ dermatotomy site was closed with Dermabond. COMPLICATIONS: COMPLICATIONS None immediate FLUOROSCOPY: Radiation Exposure Index (as provided by the fluoroscopic device): 4 mGy air Kerma COMPARISON:  None IMPRESSION: 1. Technically successful placement of tunneled right IJ hemodialysis catheter with ultrasound and fluoroscopic guidance. Ready for routine use. ACCESS: Remains approachable for percutaneous intervention as needed. Electronically Signed   By: Lucrezia Europe M.D.   On: 05/03/2021 16:14   US BIOPSY (KIDNEY)  Result Date: 05/03/2021 CLINICAL DATA:  Renal insufficiency EXAM: ULTRASOUND GUIDED RENAL CORE BIOPSY COMPARISON:  CT 04/23/2021 TECHNIQUE: Survey ultrasound was performed and an appropriate skin entry site was localized. Site was marked, prepped with  Betadine, draped in usual sterile fashion, infiltrated locally with 1% lidocaine. Intravenous Fentanyl 41mg and Versed '1mg'$  were administered as conscious sedation during continuous monitoring of the patient's level of consciousness and physiological / cardiorespiratory status by the radiology RN, with a total moderate sedation time of 10 minutes. Under real time ultrasound guidance, a 15 gauge trocar needle was advanced to the margin of the lower pole of the left kidney for 3 coaxial 16 gauge core biopsy needle passes. The core samples were submitted to pathology. The patient tolerated procedure well. Complications: None immediate IMPRESSION: 1. Technically successful ultrasound-guided core renal biopsy , left lower pole. Electronically Signed   By: DLucrezia EuropeM.D.   On: 05/03/2021 15:33     Medications:    sodium chloride Stopped (04/28/21 1032)    aspirin EC  81 mg Oral Daily   atropine  1 drop Right Eye QHS   brimonidine  1 drop Left Eye TID   Chlorhexidine Gluconate Cloth  6 each Topical Q0600   Chlorhexidine Gluconate Cloth  6 each Topical Q0600   dorzolamide-timolol  1 drop Left Eye BID   feeding supplement  237 mL Oral TID BM   latanoprost  1 drop Left Eye QHS   mouth rinse  15 mL Mouth Rinse BID   multivitamin with minerals  1 tablet Oral Daily   pantoprazole  40 mg Oral Daily   pilocarpine  1 drop Left Eye TID   polyethylene glycol  17 g Oral Daily   prednisoLONE acetate  1 drop Right Eye QHS   sodium chloride flush  10-40 mL Intracatheter Q12H   triamcinolone ointment  1 application. 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  Unresponsive to IV diuretics has received CRRT 04/25/2021-04/28/2021.  Transition to acute intermittent hemodialysis.  Next dialysis treatment will be 05/06/2021.  We will  continue to monitor for recovery Acute kidney injury uncertain etiology.  Renal biopsy   performed 05/03/2021. Dialysis access.  Tunneled dialysis catheter. Hyperkalemia resolved Anemia mild Metabolic acidosis resolved Diabetic mellitus as per primary service Atrial fibrillation as per primary service   LOS: 1[place]'@TODAY''@7'$ :28 AM

## 2021-05-05 NOTE — Progress Notes (Signed)
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   Patient started to have increasing bilateral lower extremities swelling about 3 months ago and was started of po Lasix 40 mg daily, swelling appears to be improving, patient however had episodes of feeling lightheadedness and "too much urination" and decided to cut down Lasix from 40 mg daily to 20 mg daily about 1 month ago. Condition remained stable since until about 1 week ago, patient started to notice swelling in his abdomen and legs, and decreased urine output.  Finally, for the past 3 days, there has been barely any urine came out. He said he feeling "bladder fullness" when having BM, but no urine came out. No abdominal pain, no back pain, no shortness of breath, no fever or chills. No Hx of CHF or stroke  2/24: Admitted to [facility] for acute on chronic renal failure, new onset a. Fib. Nephrology consulted; high dose Lasix trial initiated, large volume paracentesis of 8.3 L 2/26: PCCM consulted. Transfer to ICU for CRRT  2/27: tolerated CRRT 3/1 Heparin drip restarted 2/28 and patient has had recurrent hematuria and bleeding IV site. Heparin drip now on hold  3/6 - Renal biopsy obtained 3/7 - transition to iHD, tolerating well 3/8 - Awaiting transfer to CIR   Assessment and Plan:  Acute Kidney Failure with Anuria requiring RRT - now transitioning to HD Anasarca - Nephro following - continues on HD - Patient with acute kidney injury of unclear origin. Differential includes ATN versus nephrotic syndrome versus acute GN given hematuria. - Serologies for acute GN thus far negative, hep b/c negative, ANA, unable thus far to obtain urine sample  for multiple myeloma work up (temp HD line placed) - Negative renal US - Failed diuresis, started on CRRT 2/26-3/1, HD#1 04/29/21 - Status post renal biopsy on 3/6  - Foley changed yesterday due to bladder scan revealing >400 cc  - Continue to monitor for recovery  New onset atrial fibrillation - Rate control with metoprolol.   - CHADVASC score of 4 (age, HTN, DM), initially holding anticoagulation in setting of gross hematuria.  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 - 3/7 continue home ophthalmic meds   Hyponatremia - Likely secondary to volume overload - Continue on dialysis with nephrology  Thrombocytopenia.  - Resolved   Type 2 DM with Hypoglycemia.  - Hypoglycemia resolved.  - CBGs within goal  Hyperkalemia - Resolved with CRRT  Metabolic acidosis  -Resolved   Please note:  Medication Issues; Preferred narcotic agents for pain control are hydromorphone, fentanyl, and methadone. Morphine should not be used.  Baclofen should be avoided Avoid oral sodium phosphate and magnesium citrate based laxatives / bowel preps   DVT prophylaxis: SCDs Code Status: DNR Family Communication: None at bedside Disposition Plan: CIR -pending bed availability and insurance approval Status is: Inpatient Remains inpatient appropriate because: IV treatment, getting HD, CIR pending   Consultants:  Pccm, nephrology , IR  Subjective: No acute issues or events overnight, lengthy discussion at bedside with wife and son over the phone, complaining of constipation but otherwise denies nausea vomiting diarrhea headache fevers or chills.  Objective: Vitals:   05/04/21 2013 05/04/21 2319 05/05/21 0619 05/05/21 0749  BP: 112/78 101/72 125/83 113/85  Pulse: 91 93 92 95  Resp: '16 14 15 16  ' Temp: 99 F (37.2 C) 98.2 F (36.8 C)  99 F (37.2 C)  TempSrc: Oral Oral  Oral  SpO2: 97% 98% 96% 100%  Weight:   110.2  kg   Height:        Intake/Output Summary (Last 24 hours) at 05/05/2021 0753 Last data filed at 05/05/2021 0530 Gross per 24 hour  Intake --  Output 4050 ml  Net -4050 ml    Filed Weights   05/04/21 0730 05/04/21 1109 05/05/21 0619  Weight: 114.3 kg 110.3 kg 110.2 kg    Examination: Calm, NAD Clear to auscultate without wheezes or rales Reg s1/s2 no gallop Soft benign +bs mildly distended +edema b/l x4 Mood and affect appropriate in current setting   Data Reviewed: I have personally reviewed following labs and imaging studies  CBC: Recent Labs  Lab 04/30/21 0438 05/01/21 0330 05/02/21 1028 05/03/21 0558 05/04/21 0144  WBC 6.0 6.0 6.7 8.1 6.1  HGB 10.2* 10.0* 9.2* 9.4* 9.5*  HCT 29.7* 28.8* 27.8* 28.3* 28.0*  MCV 93.1 94.7 95.2 95.6 94.6  PLT 129* 142* 155 169 683    Basic Metabolic Panel: Recent Labs  Lab 05/01/21 0330 05/01/21 1600 05/02/21 1028 05/02/21 1600 05/03/21 0558 05/03/21 1633 05/04/21 0144 05/04/21 1614 05/05/21 0134  NA 133*   < >  --    < > 132* 131* 129* 134* 130*  K 4.3   < >  --    < > 3.9 4.1 4.0 3.7 3.7  CL 97*   < >  --    < > 97* 98 95* 100 96*  CO2 26   < >  --    < > '27 25 26 27 26  ' GLUCOSE 98   < >  --    < > 101* 152* 153* 135* 115*  BUN 29*   < >  --    < > 28* 34* 37* 24* 27*  CREATININE 4.86*   < >  --    < > 5.05* 5.58* 5.75* 4.16* 4.61*  CALCIUM 8.4*   < >  --    < > 8.2* 8.3* 8.2* 8.3* 8.2*  MG 2.2  --  2.0  --  2.1  --  2.2  --  2.1  PHOS 2.8   < >  --    < > 2.4* 2.5 2.4* 1.8* 2.0*   < > = values in this interval not displayed.    GFR: Estimated Creatinine Clearance: 18.2 mL/min (A) (by C-G formula based on SCr of 4.61 mg/dL (H)). Liver Function Tests: Recent Labs  Lab 05/03/21 0558 05/03/21 1633 05/04/21 0144 05/04/21 1614 05/05/21 0134  ALBUMIN 2.5* 2.5* 2.4* 2.4* 2.4*    No results for input(s): LIPASE, AMYLASE in the last 168 hours. No results for input(s): AMMONIA in the last 168 hours. Coagulation  Profile: Recent Labs  Lab 04/29/21 0415  INR 1.4*    Cardiac Enzymes: No results for input(s): CKTOTAL, CKMB, CKMBINDEX, TROPONINI in the last 168 hours.  BNP (last 3 results) No results for input(s): PROBNP in the last 8760 hours. HbA1C: No results for input(s): HGBA1C in the last 72 hours. CBG: Recent Labs  Lab 05/03/21 1638 05/03/21 2130 05/04/21 0617 05/04/21 2116 05/05/21 0618  GLUCAP 129* 153* 107* 118* 91    Lipid Profile:  No results for input(s): CHOL, HDL, LDLCALC, TRIG, CHOLHDL, LDLDIRECT in the last 72 hours. Thyroid Function Tests: No results for input(s): TSH, T4TOTAL, FREET4, T3FREE, THYROIDAB in the last 72 hours. Anemia Panel: No results for input(s): VITAMINB12, FOLATE, FERRITIN, TIBC, IRON, RETICCTPCT in the last 72 hours. Sepsis Labs: No results for input(s): PROCALCITON, LATICACIDVEN in the last 168 hours.   Recent Results (from the past 240 hour(s))  MRSA Next Gen by PCR, Nasal     Status: None   Collection Time: 04/25/21 11:41 AM   Specimen: Nasal Mucosa; Nasal Swab  Result Value Ref Range Status   MRSA by PCR Next Gen NOT DETECTED NOT DETECTED Final    Comment: (NOTE) The GeneXpert MRSA Assay (FDA approved for NASAL specimens only), is one component of a comprehensive MRSA colonization surveillance program. It is not intended to diagnose MRSA infection nor to guide or monitor treatment for MRSA infections. Test performance is not FDA approved in patients less than 63 years old. Performed at [facility], [place] [address] [place], [place] [address]      Radiology Studies: IR Fluoro Guide CV Line Right  Result Date: 05/03/2021 CLINICAL DATA:  Diabetes, renal insufficiency, needs durable venous access for hemodialysis EXAM: TUNNELED HEMODIALYSIS CATHETER PLACEMENT WITH ULTRASOUND AND FLUOROSCOPIC GUIDANCE TECHNIQUE: The procedure, risks, benefits, and alternatives were explained to the patient. Questions regarding the procedure were  encouraged and answered. The patient understands and consents to the procedure. antibioPatency of the right IJ vein was confirmed with ultrasound with image documentation. An appropriate skin site was determined. Region was prepped using maximum barrier technique including cap and mask, sterile gown, sterile gloves, large sterile sheet, and Chlorhexidine as cutaneous antisepsis. The region was infiltrated locally with 1% lidocaine. sedationUnder real-time ultrasound guidance, the right IJ vein was accessed with a 21 gauge micropuncture needle; the needle tip within the vein was confirmed with ultrasound image documentation. Needle exchanged over the 018 guidewire for transitional dilator, which allowed advancement of a Benson wire into the IVC. Over this, an MPA catheter was advanced. A Palindrome 19 hemodialysis catheter was tunneled from the right anterior chest wall approach to the right IJ dermatotomy site. The MPA catheter was exchanged over an Amplatz wire for serial vascular dilators which allow placement of a peel-away sheath, through which the catheter was advanced under intermittent fluoroscopy, positioned with its tips in the proximal and midright atrium. Spot chest radiograph confirms good catheter position. No pneumothorax. Catheter was flushed and primed per protocol. Catheter secured externally with O Prolene sutures. The right IJ dermatotomy site was closed with Dermabond. COMPLICATIONS: COMPLICATIONS None immediate FLUOROSCOPY: Radiation Exposure Index (as provided by the fluoroscopic device): 4 mGy air Kerma COMPARISON:  None IMPRESSION: 1. Technically successful placement of tunneled right IJ hemodialysis catheter with ultrasound and fluoroscopic guidance. Ready for routine use. ACCESS: Remains approachable for percutaneous intervention as needed. Electronically Signed   By: Lucrezia Europe M.D.   On: 05/03/2021 16:14   IR US Guide Vasc Access Right  Result Date: 05/03/2021 CLINICAL DATA:  Diabetes,  renal insufficiency, needs durable venous access for hemodialysis EXAM: TUNNELED HEMODIALYSIS CATHETER PLACEMENT WITH ULTRASOUND AND FLUOROSCOPIC GUIDANCE TECHNIQUE: The procedure, risks, benefits, and alternatives were explained to the patient. Questions regarding the procedure were encouraged and answered. The patient understands and consents to the procedure. antibioPatency of the right IJ vein was confirmed with ultrasound with image documentation. An appropriate skin site was determined. Region was prepped using maximum barrier technique including  cap and mask, sterile gown, sterile gloves, large sterile sheet, and Chlorhexidine as cutaneous antisepsis. The region was infiltrated locally with 1% lidocaine. sedationUnder real-time ultrasound guidance, the right IJ vein was accessed with a 21 gauge micropuncture needle; the needle tip within the vein was confirmed with ultrasound image documentation. Needle exchanged over the 018 guidewire for transitional dilator, which allowed advancement of a Benson wire into the IVC. Over this, an MPA catheter was advanced. A Palindrome 19 hemodialysis catheter was tunneled from the right anterior chest wall approach to the right IJ dermatotomy site. The MPA catheter was exchanged over an Amplatz wire for serial vascular dilators which allow placement of a peel-away sheath, through which the catheter was advanced under intermittent fluoroscopy, positioned with its tips in the proximal and midright atrium. Spot chest radiograph confirms good catheter position. No pneumothorax. Catheter was flushed and primed per protocol. Catheter secured externally with O Prolene sutures. The right IJ dermatotomy site was closed with Dermabond. COMPLICATIONS: COMPLICATIONS None immediate FLUOROSCOPY: Radiation Exposure Index (as provided by the fluoroscopic device): 4 mGy air Kerma COMPARISON:  None IMPRESSION: 1. Technically successful placement of tunneled right IJ hemodialysis catheter  with ultrasound and fluoroscopic guidance. Ready for routine use. ACCESS: Remains approachable for percutaneous intervention as needed. Electronically Signed   By: Lucrezia Europe M.D.   On: 05/03/2021 16:14   US BIOPSY (KIDNEY)  Result Date: 05/03/2021 CLINICAL DATA:  Renal insufficiency EXAM: ULTRASOUND GUIDED RENAL CORE BIOPSY COMPARISON:  CT 04/23/2021 TECHNIQUE: Survey ultrasound was performed and an appropriate skin entry site was localized. Site was marked, prepped with Betadine, draped in usual sterile fashion, infiltrated locally with 1% lidocaine. Intravenous Fentanyl 80mg and Versed 167mwere administered as conscious sedation during continuous monitoring of the patient's level of consciousness and physiological / cardiorespiratory status by the radiology RN, with a total moderate sedation time of 10 minutes. Under real time ultrasound guidance, a 15 gauge trocar needle was advanced to the margin of the lower pole of the left kidney for 3 coaxial 16 gauge core biopsy needle passes. The core samples were submitted to pathology. The patient tolerated procedure well. Complications: None immediate IMPRESSION: 1. Technically successful ultrasound-guided core renal biopsy , left lower pole. Electronically Signed   By: D Lucrezia Europe.D.   On: 05/03/2021 15:33        Scheduled Meds:  aspirin EC  81 mg Oral Daily   atropine  1 drop Right Eye QHS   brimonidine  1 drop Left Eye TID   Chlorhexidine Gluconate Cloth  6 each Topical Q0600   Chlorhexidine Gluconate Cloth  6 each Topical Q0600   Chlorhexidine Gluconate Cloth  6 each Topical Q0600   dorzolamide-timolol  1 drop Left Eye BID   feeding supplement  237 mL Oral TID BM   latanoprost  1 drop Left Eye QHS   mouth rinse  15 mL Mouth Rinse BID   multivitamin with minerals  1 tablet Oral Daily   pantoprazole  40 mg Oral Daily   pilocarpine  1 drop Left Eye TID   polyethylene glycol  17 g Oral Daily   prednisoLONE acetate  1 drop Right Eye QHS    sodium chloride flush  10-40 mL Intracatheter Q12H   triamcinolone ointment  1 application. Topical Daily   Continuous Infusions:  sodium chloride Stopped (04/28/21 1032)    Assessment & Plan:   Principal Problem:   AKI (acute kidney injury) (HC[place]Active Problems:   Pressure injury of skin  Protein-calorie malnutrition, severe    LOS: 12 days   Time spent: 38mnutes with more than 50% on CCalhoun DO [facility]  If 7PM-7AM, please contact night-coverage 05/05/2021, 7:53 AM

## 2021-05-05 NOTE — TOC Benefit Eligibility Note (Signed)
Patient Advocate Encounter ?  ?Insurance verification completed.   ?  ?The patient is currently admitted and upon discharge could be taking APIXABAN . ?  ?The current 30 day co-pay is, $40.  ? ?The patient is insured through [employer]. ? ? ?  ? ?

## 2021-05-05 NOTE — Plan of Care (Signed)
  Problem: Clinical Measurements: Goal: Will remain free from infection Outcome: Progressing Goal: Cardiovascular complication will be avoided Outcome: Progressing   Problem: Activity: Goal: Risk for activity intolerance will decrease Outcome: Progressing   Problem: Nutrition: Goal: Adequate nutrition will be maintained Outcome: Progressing   

## 2021-05-05 NOTE — Progress Notes (Signed)
Physical Therapy Treatment ?Patient Details ?Name: Austin Wheeler ?MRN: [id number] ?DOB: 06/13/1940 ?Today's Date: 05/05/2021 ? ? ?History of Present Illness 81 y/o male who presents on 04/23/21 with SOB, weakness and swelling abdomen/LEs. Found to have new onset A-fib, AKI injury of unknown origin started on HD, s/p paracentesis 2/24. Renal biopsy planned fof 05/03/21. PMH includes DM, HTN, legally blind. ? ?  ?PT Comments  ? ?  Pt reporting fatigue and significant discomfort from constipation and burning with urination. After discussion with pt/pt wife regarding PT role, recommendations, and importance of mobility, pt agreeable to participate. Pt able to perform bed level exercises for strengthening and ROM. Requiring two person moderate assist for bed mobility. Worked on static and dynamic sitting balance for core and truncal activation and control. Will continue to emphasize progressing bed mobility and transfers out of bed as tolerated. Recommend post acute rehab to address deficits and maximize functional mobility.  ?  ?Recommendations for follow up therapy are one component of a multi-disciplinary discharge planning process, led by the attending physician.  Recommendations may be updated based on patient status, additional functional criteria and insurance authorization. ? ?Follow Up Recommendations ? Acute inpatient rehab (3hours/day) ?  ?  ?Assistance Recommended at Discharge Frequent or constant Supervision/Assistance  ?Patient can return home with the following Two people to help with walking and/or transfers;Help with stairs or ramp for entrance;Assistance with cooking/housework;Two people to help with bathing/dressing/bathroom ?  ?Equipment Recommendations ? Wheelchair (measurements PT);Wheelchair cushion (measurements PT);BSC/3in1  ?  ?Recommendations for Other Services   ? ? ?  ?Precautions / Restrictions Precautions ?Precautions: Fall ?Precaution Comments: legally blind, A-fib ?Restrictions ?Weight Bearing  Restrictions: No  ?  ? ?Mobility ? Bed Mobility ?Overal bed mobility: Needs Assistance ?Bed Mobility: Supine to Sit, Sit to Supine ?  ?  ?Supine to sit: Mod assist, +2 for physical assistance ?Sit to supine: Mod assist, +2 for physical assistance ?  ?General bed mobility comments: Pt assisting bringing BLE's to edge of bed, hand over hand guidance for locating rail, use of bed pad to scoot hips forward and assist at trunk to power up. Assist for BLE's back into bed ?  ? ?Transfers ?  ?  ?  ?  ?  ?  ?  ?  ?  ?General transfer comment: deferred today ?  ? ?Ambulation/Gait ?  ?  ?  ?  ?  ?  ?  ?  ? ? ?Stairs ?  ?  ?  ?  ?  ? ? ?Wheelchair Mobility ?  ? ?Modified Rankin (Stroke Patients Only) ?  ? ? ?  ?Balance Overall balance assessment: Needs assistance ?Sitting-balance support: Feet supported, No upper extremity supported ?Sitting balance-Leahy Scale: Poor ?Sitting balance - Comments: posterior lean, initially requiring min-modA, but progressing to min guard assist with time. cues for use of BUE's for external support ?  ?  ?  ?  ?  ?  ?  ?  ?  ?  ?  ?  ?  ?  ?  ?  ? ?  ?Cognition Arousal/Alertness: Awake/alert ?Behavior During Therapy: [facility] for tasks assessed/performed ?Overall Cognitive Status: Within Functional Limits for tasks assessed ?  ?  ?  ?  ?  ?  ?  ?  ?  ?  ?  ?  ?  ?  ?  ?  ?General Comments: Requiring increased time and cues ?  ?  ? ?  ?Exercises General Exercises - Upper Extremity ?Shoulder  Flexion: AAROM, Both, 10 reps, Supine ?Elbow Flexion: AAROM, Both, 10 reps, Supine ?General Exercises - Lower Extremity ?Ankle Circles/Pumps: Both, 10 reps, Supine ?Quad Sets: Both, 10 reps, Supine ?Long Arc Quad: AAROM, Both, Other reps (comment), Seated (3 reps) ?Heel Slides: Both, 5 reps, Supine, AAROM ?Hip ABduction/ADduction: AAROM, Both, 5 reps, Supine ? ?  ?General Comments   ?  ?  ? ?Pertinent Vitals/Pain Pain Assessment ?Pain Assessment: Faces ?Faces Pain Scale: Hurts even more ?Pain Location: L side ?Pain  Descriptors / Indicators: Grimacing, Guarding, Discomfort, Sore, Aching ?Pain Intervention(s): Monitored during session  ? ? ?Home Living   ?  ?  ?  ?  ?  ?  ?  ?  ?  ?   ?  ?Prior Function    ?  ?  ?   ? ?PT Goals (current goals can now be found in the care plan section) Acute Rehab PT Goals ?Patient Stated Goal: decrease pain, go to the bathroom ?Potential to Achieve Goals: Fair ? ?  ?Frequency ? ? ? Min 3X/week ? ? ? ?  ?PT Plan Current plan remains appropriate  ? ? ?Co-evaluation   ?  ?  ?  ?  ? ?  ?AM-PAC PT "6 Clicks" Mobility   ?Outcome Measure ? Help needed turning from your back to your side while in a flat bed without using bedrails?: A Lot ?Help needed moving from lying on your back to sitting on the side of a flat bed without using bedrails?: Total ?Help needed moving to and from a bed to a chair (including a wheelchair)?: Total ?Help needed standing up from a chair using your arms (e.g., wheelchair or bedside chair)?: Total ?Help needed to walk in hospital room?: Total ?Help needed climbing 3-5 steps with a railing? : Total ?6 Click Score: 7 ? ?  ?End of Session   ?Activity Tolerance: Patient limited by pain ?Patient left: in bed;with call bell/phone within reach;with family/visitor present ?Nurse Communication: Mobility status ?PT Visit Diagnosis: Pain;Muscle weakness (generalized) (M62.81);Difficulty in walking, not elsewhere classified (R26.2) ?  ? ? ?Time: 0131-4388 ?PT Time Calculation (min) (ACUTE ONLY): 35 min ? ?Charges:  $Therapeutic Exercise: 8-22 mins ?$Therapeutic Activity: 8-22 mins          ?          ? ?Wyona Almas, PT, DPT ?Acute Rehabilitation Services ?Pager [phone] ?Office [phone] ? ? ? ?Deno Etienne ?05/05/2021, 2:48 PM ? ?

## 2021-05-05 NOTE — PMR Pre-admission (Shared)
PMR Admission Coordinator Pre-Admission Assessment  Patient: Austin Wheeler is an 81 y.o., male MRN: [id number] DOB: Jul 26, 1940 Height: '6\' 6"'  (198.1 cm) Weight: 106.4 kg  Insurance Information HMO:     PPO: yes     PCP:      IPA:      80/20:      OTHER:  PRIMARY: [employer]      Policy#: [id number]      Subscriber: pt CM Name: Dawson Bills      Phone#: [phone] ext [phone]     Fax#: [phone] Pre-Cert#: [id number]      Employer:  Benefits:  Phone #: [phone]     Name: 3/10 Eff. Date: 03/01/2019     Deduct: none      Out of Pocket Max: $4000      Life Max: none CIR: $160 co pay per day days 1 until 10      SNF: 100% covered Outpatient: $20 per visit     Co-Pay: visits per medical neccesity Home Health: 100%      Co-Pay: visits per medical neccesity DME: 80%     Co-Pay: 20% Providers: in network  SECONDARY: none  Financial Counselor:       Phone#:   [facility] for patients in Inpatient Rehabilitation Facilities with attached Privacy Act [place] Records was provided and verbally reviewed with: Patient and Family  Emergency Contact Information Contact Information     Name Relation Home Work Mobile   [place] Spouse [phone]        Current Medical History  Patient Admitting Diagnosis: Debility  History of Present Illness: 81 year old male with medical history of HTN, type 2 DM, BPH, 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 Presented on 04/23/21 with increasing bilateral Lower extremity swelling for about 3 months that has increased to swelling in hi abdomen and decreased urine output.   Creatinine 15.5 on admission. Lasix drip started in ED and nephrology consulted. He was also found to have atrial fibrillation. CT scan of abdomen and pelvis revealed large volume ascites with diffuse anasarca, trace bilateral pleural effusions, but n obstructive uropathy. Large volume paracentesis of 8.3 liters. PCCM  consulted and admitted to ICU for CRRT 2/26 through 3/1 and then began IHD on 04/29/21. Status post renal biopsy on 3/6. 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 Foley remains with some drainage issues. Hematuria minimal.  New onset atrial fibrillation rate controlled on metoprolol. Type 2 DM with hypoglycemia. Continue to monitor. Patient complains of diffuse joint pain, uric acid low and low risk for gout flare.Worse with the left PIP joints in his hand.  Patient's medical record from [facility]  has been reviewed by the rehabilitation admission coordinator and physician.  Past Medical History  Past Medical History:  Diagnosis Date   Blind    Diabetes mellitus without complication ([place])    Hypertension    Has the patient had major surgery during 100 days prior to admission? Yes  Family History   family history is not on file.  Current Medications  Current Facility-Administered Medications:    0.9 %  sodium chloride infusion, , Intravenous, PRN, Spero Geralds, MD, Stopped at 04/28/21 1032   acetaminophen (TYLENOL) tablet 500 mg, 500 mg, Oral, Daily PRN, Erick Colace, NP, 500 mg at 05/06/21 2024   apixaban (ELIQUIS) tablet 2.5 mg, 2.5 mg, Oral, BID, Donnamae Jude, RPH, 2.5 mg at 05/10/21 [phone]  aspirin EC tablet 81 mg, 81 mg, Oral, Daily, Donnamae Jude, RPH, 81 mg at 05/10/21 0955   atropine 1 % ophthalmic solution 1 drop, 1 drop, Right Eye, QHS, Erick Colace, NP, 1 drop at 05/09/21 2046   bisacodyl (DULCOLAX) suppository 10 mg, 10 mg, Rectal, Daily PRN, Knox Royalty, NP, 10 mg at 04/28/21 0300   brimonidine (ALPHAGAN) 0.2 % ophthalmic solution 1 drop, 1 drop, Left Eye, TID, Erick Colace, NP, 1 drop at 05/10/21 0958   Chlorhexidine Gluconate Cloth 2 % PADS 6 each, 6 each, Topical, Q0600, Edrick Oh, MD, 6 each at 05/10/21 0055   dorzolamide-timolol (COSOPT) 22.3-6.8 MG/ML ophthalmic solution 1 drop, 1 drop, Left  Eye, BID, Erick Colace, NP, 1 drop at 05/10/21 0959   doxazosin (CARDURA) tablet 8 mg, 8 mg, Oral, Daily, Little Ishikawa, MD, 8 mg at 05/10/21 0955   dutasteride (AVODART) capsule 0.5 mg, 0.5 mg, Oral, Q M,W,F, Little Ishikawa, MD, 0.5 mg at 05/10/21 0955   feeding supplement (ENSURE ENLIVE / ENSURE PLUS) liquid 237 mL, 237 mL, Oral, TID BM, Little Ishikawa, MD, 237 mL at 05/10/21 1319   HYDROcodone-acetaminophen (NORCO/VICODIN) 5-325 MG per tablet 1-2 tablet, 1-2 tablet, Oral, Q4H PRN, Arne Cleveland, MD, 2 tablet at 05/10/21 1012   latanoprost (XALATAN) 0.005 % ophthalmic solution 1 drop, 1 drop, Left Eye, QHS, Erick Colace, NP, 1 drop at 05/09/21 2048   MEDLINE mouth rinse, 15 mL, Mouth Rinse, BID, Halford Chessman, Vineet, MD, 15 mL at 05/10/21 0959   metoprolol tartrate (LOPRESSOR) injection 2.5 mg, 2.5 mg, Intravenous, Q6H PRN, Erick Colace, NP   multivitamin with minerals tablet 1 tablet, 1 tablet, Oral, Daily, Nolberto Hanlon, MD, 1 tablet at 05/10/21 0955   oxyCODONE (Oxy IR/ROXICODONE) immediate release tablet 5 mg, 5 mg, Oral, Q6H PRN, Spero Geralds, MD, 5 mg at 05/09/21 1439   pantoprazole (PROTONIX) EC tablet 40 mg, 40 mg, Oral, Daily, Salvadore Dom E, NP, 40 mg at 05/10/21 0955   pilocarpine (PILOCAR) 4 % ophthalmic solution 1 drop, 1 drop, Left Eye, TID, Salvadore Dom E, NP, 1 drop at 05/10/21 1000   polyethylene glycol (MIRALAX / GLYCOLAX) packet 17 g, 17 g, Oral, BID, Little Ishikawa, MD, 17 g at 05/08/21 2123   prednisoLONE acetate (PRED FORTE) 1 % ophthalmic suspension 1 drop, 1 drop, Right Eye, QHS, Erick Colace, NP, 1 drop at 05/09/21 2047   senna-docusate (Senokot-S) tablet 1 tablet, 1 tablet, Oral, BID, Little Ishikawa, MD, 1 tablet at 05/09/21 2045   simethicone (MYLICON) chewable tablet 80 mg, 80 mg, Oral, QID PRN, Little Ishikawa, MD   sodium chloride flush (NS) 0.9 % injection 10-40 mL, 10-40 mL, Intracatheter, Q12H, Spero Geralds, MD,  20 mL at 05/10/21 1000   sodium chloride flush (NS) 0.9 % injection 10-40 mL, 10-40 mL, Intracatheter, PRN, Spero Geralds, MD   [EXPIRED] Continuous Bladder Irrigation, , , Until Discontinued **AND** sodium chloride irrigation 0.9 % 3,000 mL, 3,000 mL, Irrigation, Continuous, Little Ishikawa, MD, Held at 05/08/21 1814   triamcinolone ointment (KENALOG) 0.1 % 1 application, 1 application., Topical, Daily, Erick Colace, NP, 1 application. at 05/07/21 1901  Patients Current Diet:  Diet Order             DIET DYS 3 Room service appropriate? Yes; Fluid consistency: Thin  Diet effective now  Precautions / Restrictions Precautions Precautions: Fall Precaution Comments: legally blind, A-fib, watch HR Restrictions Weight Bearing Restrictions: No Other Position/Activity Restrictions: R hip pain with bruise noted - RN notified   Has the patient had 2 or more falls or a fall with injury in the past year? No  Prior Activity Level Limited Community (1-2x/wk): Mod I with RW, Supervision with adls  Prior Functional Level Self Care: Did the patient need help bathing, dressing, using the toilet or eating? Independent  Indoor Mobility: Did the patient need assistance with walking from room to room (with or without device)? Independent  Stairs: Did the patient need assistance with internal or external stairs (with or without device)? Independent  Functional Cognition: Did the patient need help planning regular tasks such as shopping or remembering to take medications? Needed some help  Patient Information Are you of Hispanic, Latino/a,or Spanish origin?: A. No, not of Hispanic, Latino/a, or Spanish origin What is your race?: B. Black or African American Do you need or want an interpreter to communicate with a doctor or health care staff?: 0. No  Patient's Response To:  Health Literacy and Transportation Is the patient able to respond to health literacy and  transportation needs?: Yes Health Literacy - How often do you need to have someone help you when you read instructions, pamphlets, or other written material from your doctor or pharmacy?: Always In the past 12 months, has lack of transportation kept you from medical appointments or from getting medications?: No In the past 12 months, has lack of transportation kept you from meetings, work, or from getting things needed for daily living?: No  Home Assistive Devices / [place] Devices/Equipment: Environmental consultant (specify type) Home Equipment: Rolling Walker (2 wheels), Rollator (4 wheels)  Prior Device Use: Indicate devices/aids used by the patient prior to current illness, exacerbation or injury? Walker  Current Functional Level Cognition  Overall Cognitive Status: Within Functional Limits for tasks assessed Orientation Level: Oriented X4 General Comments: followed directions well but often required repeating due to blindness.  Motivated towards therapy    Extremity Assessment (includes Sensation/Coordination)  Upper Extremity Assessment: Generalized weakness RUE Deficits / Details: pain to all pivots and increased edema to elbow RUE: Shoulder pain with ROM RUE Sensation: WNL LUE Deficits / Details: pain to all pivots and increased edema to elbow LUE: Shoulder pain with ROM LUE Sensation: WNL  Lower Extremity Assessment: Defer to PT evaluation    ADLs  Overall ADL's : Needs assistance/impaired Eating/Feeding: Moderate assistance, Bed level Grooming: Wash/dry face, Supervision/safety, Set up, Bed level Grooming Details (indicate cue type and reason): Providing wash clothe to pt in supine and then pt washign his face with increased time Upper Body Bathing: Moderate assistance, Bed level Lower Body Bathing: Maximal assistance, Bed level Lower Body Bathing Details (indicate cue type and reason): required assistance with cleaning following BM in bed Upper Body Dressing : Maximal  assistance, Sitting Lower Body Dressing: Total assistance, Bed level Toilet Transfer: Moderate assistance, +2 for physical assistance Toilet Transfer Details (indicate cue type and reason): transfer to [facility] with stedy Toileting- Clothing Manipulation and Hygiene: Maximal assistance, Sit to/from stand Toileting - Clothing Manipulation Details (indicate cue type and reason): stood in stedy for toilet hygiene Functional mobility during ADLs: Moderate assistance, +2 for physical assistance (sit<>stand with stedy) General ADL Comments: Patient performed rolling in bed to clean following BM and also stood in [place] to clean after sitting on BSC    Mobility  Overal bed mobility:  Needs Assistance Bed Mobility: Rolling, Supine to Sit Rolling: Mod assist Sidelying to sit: Max assist, +2 for physical assistance Supine to sit: Mod assist, +2 for physical assistance Sit to supine: Mod assist, +2 for physical assistance General bed mobility comments: Pt requiring modA to roll to R/L for peri care, hand over hand guidance for locating railing. Pt initiating well with moving BLE's off edge of bed, truncal assist to upright    Transfers  Overall transfer level: Needs assistance Equipment used: Ambulation equipment used Transfers: Sit to/from Stand Sit to Stand: Mod assist, +2 physical assistance Bed to/from chair/wheelchair/BSC transfer type:: Via Lift equipment Transfer via Lift Equipment: [place] transfer comment: Pt performed ~5 sit to stands from edge of bed, BSC, and to and from [place] with up to [place] + 2. more difficulty with rising to stand from lower surface, cues for glute activation and upright posture.    Ambulation / Gait / Stairs / Wheelchair Mobility  Ambulation/Gait General Gait Details: unable Pre-gait activities: pt unable to initiate stepping today 2/2 fatigue after multiple standing transfers and toileting    Posture / Balance Dynamic Sitting Balance Sitting balance -  Comments: Progressing to fair, no UE support, close supervision Balance Overall balance assessment: Needs assistance Sitting-balance support: Feet supported, No upper extremity supported Sitting balance-Leahy Scale: Fair Sitting balance - Comments: Progressing to fair, no UE support, close supervision Postural control: Posterior lean Standing balance support: Bilateral upper extremity supported Standing balance-Leahy Scale: Poor Standing balance comment: able to stand in stedy for toilet hygiene    Special needs/care consideration Legally Blind both eyes; was seeing shapes before 5 to 6 weeks ago. Now complete darkness New to hemodialysis this admission Latex 16 fr replaced  3/6 Goals of Care discussions with palliative team   Previous Home Environment  Living Arrangements: Spouse/significant other  Lives With: Spouse Available Help at Discharge: Family, Available 24 hours/day Type of Home: House Home Layout: One level Home Access: Stairs to enter Entrance Stairs-Rails: Right Entrance Stairs-Number of Steps: 3 Bathroom Shower/Tub: Multimedia programmer: Standard Bathroom Accessibility: Yes How Accessible: Accessible via walker [place]: No  Discharge Living Setting Plans for Discharge Living Setting: Patient's home, Lives with (comment) (wife) Type of Home at Discharge: House Discharge Home Layout: One level Discharge Home Access: Stairs to enter Entrance Stairs-Rails: Right Entrance Stairs-Number of Steps: 3 Discharge Bathroom Shower/Tub: Walk-in shower Discharge Bathroom Toilet: Standard Discharge Bathroom Accessibility: Yes How Accessible: Accessible via walker Does the patient have any problems obtaining your medications?: No  Social/Family/Support Systems Patient Roles: Spouse Contact Information: wife, Judi Cong, "Lucianne Lei" Anticipated Caregiver: wife Anticipated Caregiver's Contact Information: see contacts Ability/Limitations of Caregiver: wife  undergoing treatment for breast Cancer Caregiver Availability: 24/7 Discharge Plan Discussed with Primary Caregiver: Yes Is Caregiver In Agreement with Plan?: Yes Does Caregiver/Family have Issues with Lodging/Transportation while Pt is in Rehab?: No  Son in [place], [place]  Goals Patient/Family Goal for Rehab: supervision with PT, supervision to min with OT Expected length of stay: ELOS 14 to 20 days Additional Information: Patient states has been legally blind but not completely until 4 to 6 weeks prior to admission. Was able to see shapes , now complete darkness Pt/Family Agrees to Admission and willing to participate: Yes Program Orientation Provided & Reviewed with Pt/Caregiver Including Roles  & Responsibilities: Yes Information Needs to be Provided By: New to hemodialysis this admission  Decrease burden of Care through IP rehab admission: n/a  Possible need for SNF  placement upon discharge: not anticipated  Patient Condition: I have reviewed medical records from [facility] , spoken with CM, and patient and spouse. I met with patient at the bedside for inpatient rehabilitation assessment.  Patient will benefit from ongoing PT and OT, can actively participate in 3 hours of therapy a day 5 days of the week, and can make measurable gains during the admission.  Patient will also benefit from the coordinated team approach during an Inpatient Acute Rehabilitation admission.  The patient will receive intensive therapy as well as Rehabilitation physician, nursing, social worker, and care management interventions.  Due to bladder management, bowel management, safety, skin/wound care, disease management, medication administration, pain management, and patient education the patient requires 24 hour a day rehabilitation nursing.  The patient is currently *** with mobility and basic ADLs.  Discharge setting and therapy post discharge at home with home health is anticipated.  Patient has  agreed to participate in the Acute Inpatient Rehabilitation Program and will admit today.  Preadmission Screen Completed By:  Cleatrice Burke, 05/10/2021 2:29 PM ______________________________________________________________________   Discussed status with Dr. Marland Kitchen on *** at *** and received approval for admission today.  Admission Coordinator:  Cleatrice Burke, RN, time Marland KitchenSudie Grumbling ***   Assessment/Plan: Diagnosis: Does the need for close, 24 hr/day Medical supervision in concert with the patient's rehab needs make it unreasonable for this patient to be served in a less intensive setting? {yes_no_potentially:3041433} Co-Morbidities requiring supervision/potential complications: *** Due to {due RV:6153794}, does the patient require 24 hr/day rehab nursing? {yes_no_potentially:3041433} Does the patient require coordinated care of a physician, rehab nurse, PT, OT, and SLP to address physical and functional deficits in the context of the above medical diagnosis(es)? {yes_no_potentially:3041433} Addressing deficits in the following areas: {deficits:3041436} Can the patient actively participate in an intensive therapy program of at least 3 hrs of therapy 5 days a week? {yes_no_potentially:3041433} The potential for patient to make measurable gains while on inpatient rehab is {potential:3041437} Anticipated functional outcomes upon discharge from inpatient rehab: {functional outcomes:304600100} PT, {functional outcomes:304600100} OT, {functional outcomes:304600100} SLP Estimated rehab length of stay to reach the above functional goals is: *** Anticipated discharge destination: {anticipated dc setting:21604} 10. Overall Rehab/Functional Prognosis: {potential:3041437}   MD Signature: ***

## 2021-05-05 NOTE — Care Management Important Message (Signed)
Important Message ? ?Patient Details  ?Name: Austin Wheeler ?MRN: [id number] ?Date of Birth: 12/04/1940 ? ? ?Medicare Important Message Given:  Yes ? ? ? ? ?Austin Wheeler ?05/05/2021, 11:07 AM ?

## 2021-05-06 ENCOUNTER — Encounter (HOSPITAL_COMMUNITY): Payer: Self-pay

## 2021-05-06 DIAGNOSIS — Z515 Encounter for palliative care: Secondary | ICD-10-CM | POA: Diagnosis not present

## 2021-05-06 DIAGNOSIS — D649 Anemia, unspecified: Secondary | ICD-10-CM | POA: Insufficient documentation

## 2021-05-06 DIAGNOSIS — N186 End stage renal disease: Secondary | ICD-10-CM | POA: Diagnosis not present

## 2021-05-06 DIAGNOSIS — R52 Pain, unspecified: Secondary | ICD-10-CM | POA: Diagnosis not present

## 2021-05-06 DIAGNOSIS — R531 Weakness: Secondary | ICD-10-CM | POA: Diagnosis not present

## 2021-05-06 DIAGNOSIS — N179 Acute kidney failure, unspecified: Secondary | ICD-10-CM | POA: Diagnosis not present

## 2021-05-06 DIAGNOSIS — Z992 Dependence on renal dialysis: Secondary | ICD-10-CM | POA: Insufficient documentation

## 2021-05-06 DIAGNOSIS — I4891 Unspecified atrial fibrillation: Secondary | ICD-10-CM | POA: Insufficient documentation

## 2021-05-06 LAB — RENAL FUNCTION PANEL
Albumin: 2.4 g/dL — ABNORMAL LOW (ref 3.5–5.0)
Albumin: 2.6 g/dL — ABNORMAL LOW (ref 3.5–5.0)
Anion gap: 9 (ref 5–15)
Anion gap: 9 (ref 5–15)
BUN: 34 mg/dL — ABNORMAL HIGH (ref 8–23)
BUN: 19 mg/dL (ref 8–23)
CO2: 25 mmol/L (ref 22–32)
CO2: 28 mmol/L (ref 22–32)
Calcium: 8.4 mg/dL — ABNORMAL LOW (ref 8.9–10.3)
Calcium: 8.5 mg/dL — ABNORMAL LOW (ref 8.9–10.3)
Chloride: 98 mmol/L (ref 98–111)
Chloride: 98 mmol/L (ref 98–111)
Creatinine, Ser: 5.53 mg/dL — ABNORMAL HIGH (ref 0.61–1.24)
Creatinine, Ser: 4 mg/dL — ABNORMAL HIGH (ref 0.61–1.24)
GFR, Estimated: 10 mL/min — ABNORMAL LOW (ref >60)
GFR, Estimated: 15 mL/min — ABNORMAL LOW (ref >60)
Glucose, Bld: 129 mg/dL — ABNORMAL HIGH (ref 70–99)
Glucose, Bld: 104 mg/dL — ABNORMAL HIGH (ref 70–99)
Phosphorus: 2 mg/dL — ABNORMAL LOW (ref 2.5–4.6)
Phosphorus: 1.7 mg/dL — ABNORMAL LOW (ref 2.5–4.6)
Potassium: 4.2 mmol/L (ref 3.5–5.1)
Potassium: 3.7 mmol/L (ref 3.5–5.1)
Sodium: 132 mmol/L — ABNORMAL LOW (ref 135–145)
Sodium: 135 mmol/L (ref 135–145)

## 2021-05-06 LAB — CBC WITH DIFFERENTIAL/PLATELET
Abs Immature Granulocytes: 0.03 10*3/uL (ref 0.00–0.07)
Basophils Absolute: 0 10*3/uL (ref 0.0–0.1)
Basophils Relative: 0 %
Eosinophils Absolute: 0.1 10*3/uL (ref 0.0–0.5)
Eosinophils Relative: 1 %
HCT: 27.3 % — ABNORMAL LOW (ref 39.0–52.0)
Hemoglobin: 9.3 g/dL — ABNORMAL LOW (ref 13.0–17.0)
Immature Granulocytes: 0 %
Lymphocytes Relative: 8 %
Lymphs Abs: 0.6 10*3/uL — ABNORMAL LOW (ref 0.7–4.0)
MCH: 32.4 pg (ref 26.0–34.0)
MCHC: 34.1 g/dL (ref 30.0–36.0)
MCV: 95.1 fL (ref 80.0–100.0)
Monocytes Absolute: 1.1 10*3/uL — ABNORMAL HIGH (ref 0.1–1.0)
Monocytes Relative: 15 %
Neutro Abs: 5.6 10*3/uL (ref 1.7–7.7)
Neutrophils Relative %: 76 %
Platelets: 156 10*3/uL (ref 150–400)
RBC: 2.87 MIL/uL — ABNORMAL LOW (ref 4.22–5.81)
RDW: 14 % (ref 11.5–15.5)
WBC: 7.4 10*3/uL (ref 4.0–10.5)
nRBC: 0 % (ref 0.0–0.2)

## 2021-05-06 LAB — GLUCOSE, CAPILLARY
Glucose-Capillary: 109 mg/dL — ABNORMAL HIGH (ref 70–99)
Glucose-Capillary: 92 mg/dL (ref 70–99)

## 2021-05-06 LAB — MAGNESIUM: Magnesium: 2.1 mg/dL (ref 1.7–2.4)

## 2021-05-06 LAB — SURGICAL PATHOLOGY

## 2021-05-06 LAB — URIC ACID: Uric Acid, Serum: 3.5 mg/dL — ABNORMAL LOW (ref 3.7–8.6)

## 2021-05-06 MED ORDER — DOXAZOSIN MESYLATE 2 MG PO TABS
8.0000 mg | ORAL_TABLET | Freq: Every day | ORAL | Status: DC
  Administered 2021-05-06 – 2021-05-14 (×8): 8 mg via ORAL
  Filled 2021-05-06 (×9): qty 4

## 2021-05-06 MED ORDER — DUTASTERIDE 0.5 MG PO CAPS
0.5000 mg | ORAL_CAPSULE | ORAL | Status: DC
  Administered 2021-05-07 – 2021-05-14 (×4): 0.5 mg via ORAL
  Filled 2021-05-06 (×4): qty 1

## 2021-05-06 MED ORDER — HEPARIN SODIUM (PORCINE) 1000 UNIT/ML IJ SOLN
4000.0000 [IU] | Freq: Once | INTRAMUSCULAR | Status: AC
  Administered 2021-05-06: 3200 [IU] via INTRAVENOUS
  Filled 2021-05-06: qty 4

## 2021-05-06 MED ORDER — SIMETHICONE 80 MG PO CHEW: 80.0000 mg | CHEWABLE_TABLET | Freq: Four times a day (QID) | ORAL | Status: DC | PRN

## 2021-05-06 NOTE — Progress Notes (Signed)
OT Cancellation Note ? ?Patient Details ?Name: Austin Wheeler ?MRN: [id number] ?DOB: [birth year] ? ? ?Cancelled Treatment:    Reason Eval/Treat Not Completed: Patient at procedure or test/ unavailable (HD. Will return as schedule allows.) ? ?[name] ?Escarlet Saathoff MSOT, OTR/L ?Acute Rehab ?Pager: [phone] ?Office: [phone] ?05/06/2021, 11:43 AM ?

## 2021-05-06 NOTE — Progress Notes (Addendum)
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   Patient started to have increasing bilateral lower extremities swelling about 3 months ago and was started of po Lasix 40 mg daily, swelling appears to be improving, patient however had episodes of feeling lightheadedness and "too much urination" and decided to cut down Lasix from 40 mg daily to 20 mg daily about 1 month ago. Condition remained stable since until about 1 week ago, patient started to notice swelling in his abdomen and legs, and decreased urine output.  Finally, for the past 3 days, there has been barely any urine came out. He said he feeling "bladder fullness" when having BM, but no urine came out. No abdominal pain, no back pain, no shortness of breath, no fever or chills. No Hx of CHF or stroke  2/24: Admitted to [facility] for acute on chronic renal failure, new onset a. Fib. Nephrology consulted; high dose Lasix trial initiated, large volume paracentesis of 8.3 L 2/26: PCCM consulted. Transfer to ICU for CRRT  2/27: tolerated CRRT 3/1 Heparin drip restarted 2/28 and patient has had recurrent hematuria and bleeding IV site. Heparin drip now on hold  3/6 - Renal biopsy obtained 3/7 - transition to iHD, tolerating well 3/8 -3/9 --> Awaiting transfer to CIR; continue to discuss goals of care with palliative team   Assessment and Plan:  Acute Kidney Failure with Anuria requiring RRT - now transitioning to HD Anasarca - Nephro following - continues on HD - Patient with acute kidney injury of unclear origin. Differential includes ATN versus nephrotic syndrome versus acute GN given hematuria. 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   - CHADVASC score of 4 (age, HTN, DM), initially holding anticoagulation in setting of gross hematuria.  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 - 3/7 continue home ophthalmic meds   Hyponatremia - Likely secondary to volume overload - Continue on dialysis with nephrology  Thrombocytopenia.  - Resolved   Type 2 DM with Hypoglycemia.  - Hypoglycemia resolved.  - CBGs within goal  Hyperkalemia - Resolved with CRRT  Metabolic acidosis  -Resolved  Gout, history of -Patient complaining of diffuse joint pain today, uric acid low; low risk for acute gout flare   Please note:  Medication Issues; Preferred narcotic agents for pain control are hydromorphone, fentanyl, and methadone. Morphine should not be used.  Baclofen should be avoided Avoid oral sodium phosphate and magnesium citrate based laxatives / bowel preps   DVT prophylaxis: SCDs Code Status: DNR Family Communication: None at bedside Disposition Plan: CIR -pending bed availability and insurance approval Status is: Inpatient Remains inpatient appropriate because: IV treatment, getting HD, CIR pending  Consultants:  Pccm, nephrology , IR  Subjective: No acute issues or events overnight, lengthy discussion at bedside with wife today.  Objective: Vitals:   05/05/21 2025 05/05/21 2322 05/06/21 0459 05/06/21 0540  BP: 100/74 105/81 118/81   Pulse: 92 94 87 97   Resp: _0 Temp: 97.9 F (36.6 C) (!) 97.5 F (36.4 C) 98.1 F (36.7 C)   TempSrc: Oral Oral Oral   SpO2: 100% 100% 100% 98%  Weight:    115 kg  Height:        Intake/Output Summary (Last 24 hours) at 05/06/2021 0745 Last data filed at 05/05/2021 2142 Gross per 24 hour  Intake 240 ml  Output --  Net 240 ml    Filed Weights   05/04/21 1109 05/05/21 0619 05/06/21 0540  Weight: 110.3 kg 110.2 kg 115 kg    Examination: Calm, NAD Clear to auscultate without wheezes or rales Reg s1/s2 no gallop Soft benign +bs mildly distended +1 edema b/l x4 Mood and affect appropriate in current setting   Data Reviewed: I have personally reviewed following labs and imaging studies  CBC: Recent Labs  Lab 04/30/21 0438 05/01/21 0330 05/02/21 1028 05/03/21 0558 05/04/21 0144  WBC 6.0 6.0 6.7 8.1 6.1  HGB 10.2* 10.0* 9.2* 9.4* 9.5*  HCT 29.7* 28.8* 27.8* 28.3* 28.0*  MCV 93.1 94.7 95.2 95.6 94.6  PLT 129* 142* 155 169 338    Basic Metabolic Panel: Recent Labs  Lab 05/02/21 1028 05/02/21 1600 05/03/21 0558 05/03/21 1633 05/04/21 0144 05/04/21 1614 05/05/21 0134 05/05/21 1524 05/06/21 0203  NA  --    < > 132*   < > 129* 134* 130* 133* 132*  K  --    < > 3.9   < > 4.0 3.7 3.7 4.0 4.2  CL  --    < > 97*   < > 95* 100 96* 98 98  CO2  --    < > 27   < > _1 GLUCOSE  --    < > 101*   < > 153* 135* 115* 133* 129*  BUN  --    < > 28*   < > 37* 24* 27* 30* 34*  CREATININE  --    < > 5.05*   < > 5.75* 4.16* 4.61* 5.25* 5.53*  CALCIUM  --    < > 8.2*   < > 8.2* 8.3* 8.2* 8.2* 8.4*  MG 2.0  --  2.1  --  2.2  --  2.1  --  2.1  PHOS  --    < > 2.4*   < > 2.4* 1.8* 2.0* 2.0* 2.0*   < > = values in this interval not displayed.    GFR: Estimated Creatinine Clearance: 15.4 mL/min (A) (by C-G formula based on SCr of 5.53 mg/dL (H)). Liver Function Tests: Recent Labs  Lab 05/04/21 0144 05/04/21 1614 05/05/21 0134 05/05/21 1524 05/06/21 0203  ALBUMIN 2.4* 2.4*  2.4* 2.4* 2.4*    No results for input(s): LIPASE, AMYLASE in the last 168 hours. No results for input(s): AMMONIA in the last 168 hours. Coagulation Profile: No results for input(s): INR, PROTIME in the last 168 hours.  Cardiac Enzymes: No results for input(s): CKTOTAL, CKMB, CKMBINDEX, TROPONINI in the last 168 hours.  BNP (last 3 results) No results for input(s): PROBNP in the last 8760 hours. HbA1C: No results for input(s): HGBA1C in the last 72 hours. CBG: Recent Labs  Lab 05/05/21 0618 05/05/21 1121 05/05/21 1609 05/05/21 2128 05/06/21 0602  GLUCAP 91 114* 126* 143* 109*    Lipid Profile: No results for input(s): CHOL, HDL, LDLCALC, TRIG, CHOLHDL, LDLDIRECT in the last 72 hours. Thyroid Function Tests: No results for input(s): TSH, T4TOTAL, FREET4, T3FREE, THYROIDAB in the last 72 hours. Anemia Panel: No results for input(s): VITAMINB12, FOLATE, FERRITIN, TIBC, IRON, RETICCTPCT in the last 72 hours. Sepsis Labs: No results for input(s): PROCALCITON, LATICACIDVEN in the last 168 hours.   No results found for this or any previous visit (from the past 240 hour(s)).   Radiology Studies: No results found.      Scheduled Meds:  apixaban  2.5 mg Oral BID   aspirin EC  81 mg Oral Daily   atropine  1 drop Right Eye QHS   brimonidine  1 drop Left Eye TID   Chlorhexidine Gluconate Cloth  6 each Topical Q0600   dorzolamide-timolol  1 drop Left Eye BID   feeding supplement  237 mL Oral TID BM   heparin sodium (porcine)  4,000 Units Intravenous Once   latanoprost  1 drop Left Eye QHS   mouth rinse  15 mL Mouth Rinse BID   multivitamin with minerals  1 tablet Oral Daily   pantoprazole  40 mg Oral Daily   pilocarpine  1 drop Left Eye TID   polyethylene glycol  17 g Oral BID   prednisoLONE acetate  1 drop Right Eye QHS   senna-docusate  1 tablet Oral BID   sodium chloride flush  10-40 mL Intracatheter Q12H   triamcinolone ointment  1 application. Topical Daily    Continuous Infusions:  sodium chloride Stopped (04/28/21 1032)    Assessment & Plan:   Principal Problem:   AKI (acute kidney injury) ([place]) Active Problems:   Pressure injury of skin   Protein-calorie malnutrition, severe    LOS: 13 days   Time spent: 65mn  Madisen Ludvigsen C Emerson Schreifels, DO [facility]  If 7PM-7AM, please contact night-coverage 05/06/2021, 7:45 AM

## 2021-05-06 NOTE — Progress Notes (Signed)
[facility] ROUNDING NOTE   Subjective:   Interval History: 81 year old gentleman hypertension diabetes prostate cancer with surveillance.  Patient was admitted with increasing bilateral lower extremity swelling.  Hospital course complicated by new onset atrial fibrillation.  Large-volume paracentesis 8.3 L.  Was transferred to the ICU 04/25/2021 for CRRT-04/28/2021.  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  Will probably need transition to intermittent hemodialysis  .  Last dialysis 05/04/2021 with 4 L removed.  Next dialysis be 05/06/2021  Blood pressure 118/81 pulse 85 temperature 98.1 O2 sats 100% room air  Urine output minimal  Sodium 132 potassium 4.2 chloride 98 CO2 25 BUN 34 creatinine 5.5 glucose 129 calcium 8.4   Objective:  Vital signs in last 24 hours:  Temp:  [97.2 F (36.2 C)-100.1 F (37.8 C)] 98.1 F (36.7 C) (03/09 0459) Pulse Rate:  [87-110] 97 (03/09 0540) Resp:  [16-20] 20 (03/09 0540) BP: (100-118)/(66-85) 118/81 (03/09 0459) SpO2:  [98 %-100 %] 98 % (03/09 0540) Weight:  [542 kg] 115 kg (03/09 0540)  Weight change: 0.7 kg Filed Weights   05/04/21 1109 05/05/21 0619 05/06/21 0540  Weight: 110.3 kg 110.2 kg 115 kg    Intake/Output: I/O last 3 completed shifts: In: 240 [P.O.:240] Out: 50 [Urine:50]   Intake/Output this shift:  No intake/output data recorded.  Blind  CVS- RRR no murmurs rubs gallops RS- CTA no wheezes or rales ABD- BS present soft non-distended EXT-2-3+ lower extremity edema.   Basic Metabolic Panel: Recent Labs  Lab 05/02/21 1028 05/02/21 1600 05/03/21 0558 05/03/21 1633 05/04/21 0144 05/04/21 1614 05/05/21 0134 05/05/21 1524 05/06/21 0203  NA  --    < > 132*   < > 129* 134* 130* 133* 132*  K  --    < > 3.9   < > 4.0 3.7 3.7 4.0 4.2  CL  --    < > 97*   < > 95* 100 96* 98 98  CO2  --    < > 27   < > '26 27 26 27 25  '$ GLUCOSE  --    < >  101*   < > 153* 135* 115* 133* 129*  BUN  --    < > 28*   < > 37* 24* 27* 30* 34*  CREATININE  --    < > 5.05*   < > 5.75* 4.16* 4.61* 5.25* 5.53*  CALCIUM  --    < > 8.2*   < > 8.2* 8.3* 8.2* 8.2* 8.4*  MG 2.0  --  2.1  --  2.2  --  2.1  --  2.1  PHOS  --    < > 2.4*   < > 2.4* 1.8* 2.0* 2.0* 2.0*   < > = values in this interval not displayed.     Liver Function Tests: Recent Labs  Lab 05/04/21 0144 05/04/21 1614 05/05/21 0134 05/05/21 1524 05/06/21 0203  ALBUMIN 2.4* 2.4* 2.4* 2.4* 2.4*    No results for input(s): LIPASE, AMYLASE in the last 168 hours. No results for input(s): AMMONIA in the last 168 hours.  CBC: Recent Labs  Lab 04/30/21 0438 05/01/21 0330 05/02/21 1028 05/03/21 0558 05/04/21 0144  WBC 6.0 6.0 6.7 8.1 6.1  HGB 10.2* 10.0* 9.2* 9.4* 9.5*  HCT 29.7* 28.8* 27.8* 28.3* 28.0*  MCV 93.1 94.7 95.2 95.6 94.6  PLT 129* 142* 155 169 164     Cardiac Enzymes: No results for input(s): CKTOTAL,  CKMB, CKMBINDEX, TROPONINI in the last 168 hours.   BNP: Invalid input(s): POCBNP  CBG: Recent Labs  Lab 05/05/21 0618 05/05/21 1121 05/05/21 1609 05/05/21 2128 05/06/21 0602  GLUCAP 91 114* 126* 143* 109*     Microbiology: Results for orders placed or performed during the hospital encounter of 04/23/21  Resp Panel by RT-PCR (Flu A&B, Covid) Nasopharyngeal Swab     Status: None   Collection Time: 04/23/21 12:23 PM   Specimen: Nasopharyngeal Swab; Nasopharyngeal(NP) swabs in vial transport medium  Result Value Ref Range Status   SARS Coronavirus 2 by RT PCR NEGATIVE NEGATIVE Final    Comment: (NOTE) SARS-CoV-2 target nucleic acids are NOT DETECTED.  The SARS-CoV-2 RNA is generally detectable in upper respiratory specimens during the acute phase of infection. The lowest concentration of SARS-CoV-2 viral copies this assay can detect is 138 copies/mL. A negative result does not preclude SARS-Cov-2 infection and should not be used as the sole basis for  treatment or other patient management decisions. A negative result may occur with  improper specimen collection/handling, submission of specimen other than nasopharyngeal swab, presence of viral mutation(s) within the areas targeted by this assay, and inadequate number of viral copies(<138 copies/mL). A negative result must be combined with clinical observations, patient history, and epidemiological information. The expected result is Negative.  Fact Sheet for Patients:  EntrepreneurPulse.com.au  Fact Sheet for Healthcare Providers:  IncredibleEmployment.be  This test is no t yet approved or cleared by the [place] FDA and  has been authorized for detection and/or diagnosis of SARS-CoV-2 by FDA under an Emergency Use Authorization (EUA). This EUA will remain  in effect (meaning this test can be used) for the duration of the COVID-19 declaration under Section 564(b)(1) of the Act, 21 U.S.C.section 360bbb-3(b)(1), unless the authorization is terminated  or revoked sooner.       Influenza A by PCR NEGATIVE NEGATIVE Final   Influenza B by PCR NEGATIVE NEGATIVE Final    Comment: (NOTE) The Xpert Xpress SARS-CoV-2/FLU/RSV plus assay is intended as an aid in the diagnosis of influenza from Nasopharyngeal swab specimens and should not be used as a sole basis for treatment. Nasal washings and aspirates are unacceptable for Xpert Xpress SARS-CoV-2/FLU/RSV testing.  Fact Sheet for Patients: EntrepreneurPulse.com.au  Fact Sheet for Healthcare Providers: IncredibleEmployment.be  This test is not yet approved or cleared by the [place] FDA and has been authorized for detection and/or diagnosis of SARS-CoV-2 by FDA under an Emergency Use Authorization (EUA). This EUA will remain in effect (meaning this test can be used) for the duration of the COVID-19 declaration under Section 564(b)(1) of the Act, 21  U.S.C. section 360bbb-3(b)(1), unless the authorization is terminated or revoked.  Performed at [facility], [place] [address] [place], [place] [address]   MRSA Next Gen by PCR, Nasal     Status: None   Collection Time: 04/25/21 11:41 AM   Specimen: Nasal Mucosa; Nasal Swab  Result Value Ref Range Status   MRSA by PCR Next Gen NOT DETECTED NOT DETECTED Final    Comment: (NOTE) The GeneXpert MRSA Assay (FDA approved for NASAL specimens only), is one component of a comprehensive MRSA colonization surveillance program. It is not intended to diagnose MRSA infection nor to guide or monitor treatment for MRSA infections. Test performance is not FDA approved in patients less than 78 years old. Performed at [facility], [place] [address] [place],  [address]     Coagulation Studies: No  results for input(s): LABPROT, INR in the last 72 hours.  Urinalysis: No results for input(s): COLORURINE, LABSPEC, PHURINE, GLUCOSEU, HGBUR, BILIRUBINUR, KETONESUR, PROTEINUR, UROBILINOGEN, NITRITE, LEUKOCYTESUR in the last 72 hours.  Invalid input(s): APPERANCEUR    Imaging: No results found.   Medications:    sodium chloride Stopped (04/28/21 1032)    apixaban  2.5 mg Oral BID   aspirin EC  81 mg Oral Daily   atropine  1 drop Right Eye QHS   brimonidine  1 drop Left Eye TID   Chlorhexidine Gluconate Cloth  6 each Topical Q0600   dorzolamide-timolol  1 drop Left Eye BID   feeding supplement  237 mL Oral TID BM   latanoprost  1 drop Left Eye QHS   mouth rinse  15 mL Mouth Rinse BID   multivitamin with minerals  1 tablet Oral Daily   pantoprazole  40 mg Oral Daily   pilocarpine  1 drop Left Eye TID   polyethylene glycol  17 g Oral BID   prednisoLONE acetate  1 drop Right Eye QHS   senna-docusate  1 tablet Oral BID   sodium chloride flush  10-40 mL Intracatheter Q12H   triamcinolone ointment  1 application. Topical Daily   sodium chloride, acetaminophen, bisacodyl,  HYDROcodone-acetaminophen, metoprolol tartrate, oxyCODONE, sodium chloride flush  Assessment/ Plan:  Dialysis dependent acute kidney injury of uncertain etiology.  Unresponsive to IV diuretics has received CRRT 04/25/2021-04/28/2021.  Transition to acute intermittent hemodialysis.  Next dialysis treatment will be 05/06/2021.   We will need to consult vein and vascular surgery for vascular access. Acute kidney injury uncertain etiology.  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 Dialysis access.  Tunneled dialysis catheter. Hyperkalemia resolved Anemia mild Metabolic acidosis resolved Diabetic mellitus as per primary service Atrial fibrillation as per primary service   LOS: [place] '@TODAY''@7'$ :36 AM

## 2021-05-06 NOTE — Discharge Instructions (Signed)
Information on my medicine - ELIQUIS (apixaban)  This medication education was reviewed with me or my healthcare representative as part of my discharge preparation.    Why was Eliquis prescribed for you? Eliquis was prescribed for you to reduce the risk of a blood clot forming that can cause a stroke if you have a medical condition called atrial fibrillation (a type of irregular heartbeat).  What do You need to know about Eliquis ? Take your Eliquis TWICE DAILY - one tablet in the morning and one tablet in the evening with or without food. If you have difficulty swallowing the tablet whole please discuss with your pharmacist how to take the medication safely.  Take Eliquis exactly as prescribed by your doctor and DO NOT stop taking Eliquis without talking to the doctor who prescribed the medication.  Stopping may increase your risk of developing a stroke.  Refill your prescription before you run out.  After discharge, you should have regular check-up appointments with your healthcare provider that is prescribing your Eliquis.  In the future your dose may need to be changed if your kidney function or weight changes by a significant amount or as you get older.  What do you do if you miss a dose? If you miss a dose, take it as soon as you remember on the same day and resume taking twice daily.  Do not take more than one dose of ELIQUIS at the same time to make up a missed dose.  Important Safety Information A possible side effect of Eliquis is bleeding. You should call your healthcare provider right away if you experience any of the following: Bleeding from an injury or your nose that does not stop. Unusual colored urine (red or dark brown) or unusual colored stools (red or black). Unusual bruising for unknown reasons. A serious fall or if you hit your head (even if there is no bleeding).  Some medicines may interact with Eliquis and might increase your risk of bleeding or clotting  while on Eliquis. To help avoid this, consult your healthcare provider or pharmacist prior to using any new prescription or non-prescription medications, including herbals, vitamins, non-steroidal anti-inflammatory drugs (NSAIDs) and supplements.  This website has more information on Eliquis (apixaban): http://www.eliquis.com/eliquis/home ==============================================  Atrial Fibrillation    Atrial fibrillation is a type of heartbeat that is irregular or fast. If you have this condition, your heart beats without any order. This makes it hard for your heart to pump blood in a normal way. Atrial fibrillation may come and go, or it may become a long-lasting problem. If this condition is not treated, it can put you at higher risk for stroke, heart failure, and other heart problems.  What are the causes? This condition may be caused by diseases that damage the heart. They include: High blood pressure. Heart failure. Heart valve disease. Heart surgery. Other causes include: Diabetes. Thyroid disease. Being overweight. Kidney disease. Sometimes the cause is not known.  What increases the risk? You are more likely to develop this condition if: You are older. You smoke. You exercise often and very hard. You have a family history of this condition. You are a man. You use drugs. You drink a lot of alcohol. You have lung conditions, such as emphysema, pneumonia, or COPD. You have sleep apnea.  What are the signs or symptoms? Common symptoms of this condition include: A feeling that your heart is beating very fast. Chest pain or discomfort. Feeling short of breath. Suddenly feeling light-headed  or weak. Getting tired easily during activity. Fainting. Sweating. In some cases, there are no symptoms.  How is this treated? Treatment for this condition depends on underlying conditions and how you feel when you have atrial fibrillation. They include: Medicines  to: Prevent blood clots. Treat heart rate or heart rhythm problems. Using devices, such as a pacemaker, to correct heart rhythm problems. Doing surgery to remove the part of the heart that sends bad signals. Closing an area where clots can form in the heart (left atrial appendage). In some cases, your doctor will treat other underlying conditions.  Follow these instructions at home:  Medicines Take over-the-counter and prescription medicines only as told by your doctor. Do not take any new medicines without first talking to your doctor. If you are taking blood thinners: Talk with your doctor before you take any medicines that have aspirin or NSAIDs, such as ibuprofen, in them. Take your medicine exactly as told by your doctor. Take it at the same time each day. Avoid activities that could hurt or bruise you. Follow instructions about how to prevent falls. Wear a bracelet that says you are taking blood thinners. Or, carry a card that lists what medicines you take. Lifestyle         Do not use any products that have nicotine or tobacco in them. These include cigarettes, e-cigarettes, and chewing tobacco. If you need help quitting, ask your doctor. Eat heart-healthy foods. Talk with your doctor about the right eating plan for you. Exercise regularly as told by your doctor. Do not drink alcohol. Lose weight if you are overweight. Do not use drugs, including cannabis.  General instructions If you have a condition that causes breathing to stop for a short period of time (apnea), treat it as told by your doctor. Keep a healthy weight. Do not use diet pills unless your doctor says they are safe for you. Diet pills may make heart problems worse. Keep all follow-up visits as told by your doctor. This is important.  Contact a doctor if: You notice a change in the speed, rhythm, or strength of your heartbeat. You are taking a blood-thinning medicine and you get more bruising. You get tired  more easily when you move or exercise. You have a sudden change in weight.  Get help right away if:    You have pain in your chest or your belly (abdomen). You have trouble breathing. You have side effects of blood thinners, such as blood in your vomit, poop (stool), or pee (urine), or bleeding that cannot stop. You have any signs of a stroke. "BE FAST" is an easy way to remember the main warning signs: B - Balance. Signs are dizziness, sudden trouble walking, or loss of balance. E - Eyes. Signs are trouble seeing or a change in how you see. F - Face. Signs are sudden weakness or loss of feeling in the face, or the face or eyelid drooping on one side. A - Arms. Signs are weakness or loss of feeling in an arm. This happens suddenly and usually on one side of the body. S - Speech. Signs are sudden trouble speaking, slurred speech, or trouble understanding what people say. T - Time. Time to call emergency services. Write down what time symptoms started. You have other signs of a stroke, such as: A sudden, very bad headache with no known cause. Feeling like you may vomit (nausea). Vomiting. A seizure.  These symptoms may be an emergency. Do not wait to see  if the symptoms will go away. Get medical help right away. Call your local emergency services (911 in the [place] Do not drive yourself to the hospital. Summary Atrial fibrillation is a type of heartbeat that is irregular or fast. You are at higher risk of this condition if you smoke, are older, have diabetes, or are overweight. Follow your doctor's instructions about medicines, diet, exercise, and follow-up visits. Get help right away if you have signs or symptoms of a stroke. Get help right away if you cannot catch your breath, or you have chest pain or discomfort. This information is not intended to replace advice given to you by your health care provider. Make sure you discuss any questions you have with your health care  provider. Document Revised: 08/08/2018 Document Reviewed: 08/08/2018 Elsevier Patient Education  [place].

## 2021-05-06 NOTE — Progress Notes (Signed)
Patient ID: Austin Wheeler, male   DOB: 08-22-1940, 81 y.o.   MRN: [id number] ? ? ? ?Progress Note from the Palliative Medicine Team at [facility] ? ? ?Patient Name: Austin Wheeler        ?Date: 05/06/2021 ?DOB: 1941/01/10  Age: 81 y.o. MRN#: [id number] ?Attending Physician: Austin Ishikawa, MD ?Primary Care Physician: Austin Huddle, MD ?Admit Date: 04/23/2021 ? ? ?Medical records reviewed  ? 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  S/p renal biopsy ? ? ?Tolerating intermittent hemodialysis.  Patient is working with therapies. Struggling with his siutation ? ?Austin Wheeler alert and oriented, wife at bedside.   ? ?Patient c/o generalized "pain everywhere", today it seems to be centered in his left wrist and fingers.  He verbalizes that he thinks it is gout.  In the past he treated what he has believed to be gout with cherry extract OTC, wife confirms.  ? ?Discussed with Dr Austin Wheeler, he ok's outside supplements if patient so desires.  I placed order for Uric Acid.  ? ?    Informed patient and his wife of normal uric acid and approval to utilize OTC medications if desired;  they will need to discuss with nursing and attending at the time. ? ?Austin Wheeler continues to  verbalizes frustration with overall loss of independence, vulnerability 2/2 to his current medical situation. ? ? ?     Returned later in the afternoon: ?Created space and opportunity for patient to explore thoughts and feelings regarding his current medical situation.  He is coming to the realization that he will be dependant on dialysis into the future.  "I don't have any other choice".    ? ?Education offered regarding patient autonomy to declined offered interventions.    ?Education offered on the limited prognosis of  less than two week without kidney function or dialysis.  Education offered on the natural trajectory and expectations at EOL with ESRD. ?    ?Education offered on the logistics of ongoing outpatient hemodialysis. ? ?Plan of care ?-DNR/DNI ?-Symptom management ?-Patient and family are open to all offered and available medical interventions to prolong life at this time ?-Hopeful for CIR for ongoing rehabilitation ?     Encouraged Austin Wheeler fully participate in therapies as he hopes to secure a bed in CIR ? ? ?Discussed with patient the importance of continued conversation with his family and their  medical providers regarding overall plan of care and treatment options,  ensuring decisions are within the context of the patients values and GOCs. ? ?Questions and concerns addressed    ? ?PMT will continue to support holistically. ? ?This nurse practitioner informed  the patient/family and the attending that I will be out of the hospital until Monday morning.  If the patient is still hospitalized I will follow-up at that time. ? ?Call palliative medicine team phone # [phone] with questions or concerns in the interim  ? ? ?Austin Lessen NP  ?Palliative Medicine Team Team Phone # [phone] ?Pager [phone] ?  ?

## 2021-05-06 NOTE — Progress Notes (Signed)
Nutrition Follow-up ? ?DOCUMENTATION CODES:  ? ?Severe malnutrition in context of acute illness/injury ? ?INTERVENTION:  ? ?Continue Ensure Enlive po TID, each supplement provides 350 kcal and 20 grams of protein. ? ?Change MVI with minerals to Renal MVI daily. ? ?Continue bowel regimen. ? ?NUTRITION DIAGNOSIS:  ? ?Severe Malnutrition related to acute illness (AKI) as evidenced by moderate fat depletion, moderate muscle depletion, severe muscle depletion. ? ?Ongoing  ? ?GOAL:  ? ?Patient will meet greater than or equal to 90% of their needs ? ?Progressing  ? ?MONITOR:  ? ?PO intake, Supplement acceptance, Labs, I & O's ? ?REASON FOR ASSESSMENT:  ? ?Rounds ?  ? ?ASSESSMENT:  ? ?81 yo male admitted with AKI. PMH includes HTN, DM, BPH, legally blind both eyes. ? ?S/P HD this morning. 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  ?Plans for VVS consult for vascular access.  ? ?Patient is currently on a dysphagia 3 diet with thin liquids. ?Meal intakes documented at 0-20%. ?Receiving Ensure supplements between meals. Drinking ~2 per day.  ?He c/o decreased appetite and constipation. LBM documented 3/5. Started Miralax BID and Senokot-S BID yesterday.  ? ?Labs reviewed. Na 132, phos 2 ?CBG: 109-92 ? ?Medications reviewed and include MVI with minerals, Miralax, Senokot-S. ?Now that patient is receiving iHD, will change MVI to Renal MVI to account for losses with HD.  ? ?Weight continues to decrease. ?Currently 111 kg, down from 134.7 kg on admission. ?Weight loss is likely a combination of fluids and decreased LBM. ? ?Diet Order:   ?Diet Order   ? ?       ?  DIET DYS 3 Room service appropriate? Yes; Fluid consistency: Thin  Diet effective now       ?  ? ?  ?  ? ?  ? ? ?EDUCATION NEEDS:  ? ?Not appropriate for education at this time ? ?Skin:  Skin Assessment: Reviewed RN Assessment (skin tear R elbow) ? ?Last BM:  3/5 ? ?Height:  ? ?Ht Readings from Last 1 Encounters:  ?04/23/21 '6\' 6"'$  (1.981 m)  ? ? ?Weight:   ? ?Wt Readings from Last 1 Encounters:  ?05/06/21 111 kg  ? ? ?Ideal Body Weight:  97.3 kg ? ?BMI:  Body mass index is 28.28 kg/m?. ? ?Estimated Nutritional Needs:  ? ?Kcal:  2400 - 2600 ? ?Protein:  120 - 135 grams ? ?Fluid:  > 2.4 L ? ? ? ?Lucas Mallow RD, LDN, CNSC ?Please refer to Amion for contact information.                                                       ? ?

## 2021-05-07 DIAGNOSIS — N179 Acute kidney failure, unspecified: Secondary | ICD-10-CM | POA: Diagnosis not present

## 2021-05-07 LAB — RENAL FUNCTION PANEL
Albumin: 2.5 g/dL — ABNORMAL LOW (ref 3.5–5.0)
Albumin: 2.4 g/dL — ABNORMAL LOW (ref 3.5–5.0)
Anion gap: 10 (ref 5–15)
Anion gap: 12 (ref 5–15)
BUN: 24 mg/dL — ABNORMAL HIGH (ref 8–23)
BUN: 30 mg/dL — ABNORMAL HIGH (ref 8–23)
CO2: 27 mmol/L (ref 22–32)
CO2: 23 mmol/L (ref 22–32)
Calcium: 8.5 mg/dL — ABNORMAL LOW (ref 8.9–10.3)
Calcium: 8.4 mg/dL — ABNORMAL LOW (ref 8.9–10.3)
Chloride: 95 mmol/L — ABNORMAL LOW (ref 98–111)
Chloride: 97 mmol/L — ABNORMAL LOW (ref 98–111)
Creatinine, Ser: 4.47 mg/dL — ABNORMAL HIGH (ref 0.61–1.24)
Creatinine, Ser: 4.96 mg/dL — ABNORMAL HIGH (ref 0.61–1.24)
GFR, Estimated: 13 mL/min — ABNORMAL LOW (ref >60)
GFR, Estimated: 11 mL/min — ABNORMAL LOW (ref >60)
Glucose, Bld: 108 mg/dL — ABNORMAL HIGH (ref 70–99)
Glucose, Bld: 132 mg/dL — ABNORMAL HIGH (ref 70–99)
Phosphorus: 2.1 mg/dL — ABNORMAL LOW (ref 2.5–4.6)
Phosphorus: 2 mg/dL — ABNORMAL LOW (ref 2.5–4.6)
Potassium: 3.8 mmol/L (ref 3.5–5.1)
Potassium: 3.9 mmol/L (ref 3.5–5.1)
Sodium: 132 mmol/L — ABNORMAL LOW (ref 135–145)
Sodium: 132 mmol/L — ABNORMAL LOW (ref 135–145)

## 2021-05-07 LAB — MAGNESIUM: Magnesium: 2.1 mg/dL (ref 1.7–2.4)

## 2021-05-07 MED ORDER — CHLORHEXIDINE GLUCONATE CLOTH 2 % EX PADS
6.0000 | MEDICATED_PAD | Freq: Every day | CUTANEOUS | Status: DC
  Administered 2021-05-07 – 2021-05-10 (×4): 6 via TOPICAL

## 2021-05-07 NOTE — Progress Notes (Signed)
Occupational Therapy Treatment ?Patient Details ?Name: Austin Wheeler ?MRN: [id number] ?DOB: September 09, 1940 ?Today's Date: 05/07/2021 ? ? ?History of present illness 81 y/o male who presents on 04/23/21 with SOB, weakness and swelling abdomen/LEs. Found to have new onset A-fib, AKI injury of unknown origin started on HD, s/p paracentesis 2/24. Renal biopsy planned fof 05/03/21. PMH includes DM, HTN, legally blind. ?  ?OT comments ? Patient received in supine and agreeable to OT/PT session. Patient required assistance of 2 for bed mobility and Stedy for transfer to [facility].  Patient stood in stedy for toilet hygiene before transferring to recliner. Patient requires frequent cues due to blindness and demonstrated pain at right wrist. Discharge recommendation for AIR for benefit of progress rehab to increase independence with mobility and transfers.   ? ?Recommendations for follow up therapy are one component of a multi-disciplinary discharge planning process, led by the attending physician.  Recommendations may be updated based on patient status, additional functional criteria and insurance authorization. ?   ?Follow Up Recommendations ? Acute inpatient rehab (3hours/day)  ?  ?Assistance Recommended at Discharge Frequent or constant Supervision/Assistance  ?Patient can return home with the following ? Two people to help with walking and/or transfers;Two people to help with bathing/dressing/bathroom;Assistance with cooking/housework;Help with stairs or ramp for entrance;Assist for transportation;Direct supervision/assist for financial management;Direct supervision/assist for medications management ?  ?Equipment Recommendations ? Wheelchair (measurements OT);Wheelchair cushion (measurements OT);Tub/shower seat;BSC/3in1;Hospital bed  ?  ?Recommendations for Other Services   ? ?  ?Precautions / Restrictions Precautions ?Precautions: Fall ?Precaution Comments: legally blind, A-fib ?Restrictions ?Weight Bearing Restrictions: No  ? ? ?   ? ?Mobility Bed Mobility ?Overal bed mobility: Needs Assistance ?Bed Mobility: Supine to Sit ?  ?  ?Supine to sit: Mod assist, +2 for physical assistance, HOB elevated ?  ?  ?General bed mobility comments: patient appeared nervous during bed mobility and required fequent cues for directions and reassurance. Patient required assistance with BLEs and trunk ?  ? ?Transfers ?Overall transfer level: Needs assistance ?Equipment used: Ambulation equipment used ?Transfers: Sit to/from Stand, Bed to chair/wheelchair/BSC ?Sit to Stand: Mod assist, Max assist, +2 physical assistance, From elevated surface ?  ?  ?  ?  ?  ?General transfer comment: elevated bed height>Stedy, then BSC<>Stedy and Stedy seat>recliner ?Transfer via Lift Equipment: Stedy ?  ?Balance Overall balance assessment: Needs assistance ?Sitting-balance support: Feet supported, No upper extremity supported ?Sitting balance-Leahy Scale: Poor ?Sitting balance - Comments: patient appeared to feel more secure when holding onto stedy ?  ?Standing balance support: During functional activity, Bilateral upper extremity supported ?Standing balance-Leahy Scale: Poor ?Standing balance comment: able to stand in stedy for toilet hygiene ?  ?  ?  ?  ?  ?  ?  ?  ?  ?  ?  ?   ? ?ADL either performed or assessed with clinical judgement  ? ?ADL Overall ADL's : Needs assistance/impaired ?  ?  ?  ?  ?  ?  ?  ?  ?  ?  ?  ?  ?Toilet Transfer: Moderate assistance;Maximal assistance;+2 for physical assistance;BSC/3in1 ?Toilet Transfer Details (indicate cue type and reason): transfer to [facility] with stedy ?Toileting- Clothing Manipulation and Hygiene: Maximal assistance;Sit to/from stand ?Toileting - Clothing Manipulation Details (indicate cue type and reason): stood in stedy for toilet hygiene ?  ?  ?  ?General ADL Comments: toilet transfer performed with stedy with patient standing in stedy for hygiene ?  ? ?Extremity/Trunk Assessment Upper Extremity Assessment ?RUE Deficits /  Details:  pain to all pivots and increased edema to elbow ?RUE Sensation: WNL ?LUE Deficits / Details: pain to all pivots and increased edema to elbow ?LUE Sensation: WNL ?  ?  ?  ?  ?  ? ?Vision   ?  ?  ?Perception   ?  ?Praxis   ?  ? ?Cognition Arousal/Alertness: Awake/alert ?Behavior During Therapy: [facility] for tasks assessed/performed ?Overall Cognitive Status: Within Functional Limits for tasks assessed ?  ?  ?  ?  ?  ?  ?  ?  ?  ?  ?  ?  ?  ?  ?  ?  ?General Comments: anxiety due to blindness.  Performs well with direct commands and directions ?  ?  ?   ?Exercises   ? ?  ?Shoulder Instructions   ? ? ?  ?General Comments    ? ? ?Pertinent Vitals/ Pain       Pain Assessment ?Pain Assessment: Faces ?Faces Pain Scale: Hurts little more ?Pain Location: L wrist (IV at wrist), knees ?Pain Descriptors / Indicators: Grimacing, Guarding, Discomfort, Aching ?Pain Intervention(s): Monitored during session, Repositioned ? ?Home Living   ?  ?  ?  ?  ?  ?  ?  ?  ?  ?  ?  ?  ?  ?  ?  ?  ?  ?  ? ?  ?Prior Functioning/Environment    ?  ?  ?  ?   ? ?Frequency ? Min 2X/week  ? ? ? ? ?  ?Progress Toward Goals ? ?OT Goals(current goals can now be found in the care plan section) ? Progress towards OT goals: Progressing toward goals ? ?Acute Rehab OT Goals ?Patient Stated Goal: go to rehab ?OT Goal Formulation: With patient/family ?Time For Goal Achievement: 05/14/21 ?Potential to Achieve Goals: Fair ?ADL Goals ?Pt Will Perform Grooming: with set-up;sitting ?Pt Will Perform Upper Body Bathing: with set-up;sitting ?Pt Will Perform Upper Body Dressing: with set-up;sitting ?Pt Will Transfer to Toilet: with mod assist;stand pivot transfer;bedside commode ?Pt/caregiver will Perform Home Exercise Program: Increased strength;Both right and left upper extremity;With minimal assist;With written HEP provided  ?Plan Discharge plan remains appropriate   ? ?Co-evaluation ? ? ? PT/OT/SLP Co-Evaluation/Treatment: Yes ?Reason for Co-Treatment: Necessary to  address cognition/behavior during functional activity;For patient/therapist safety;To address functional/ADL transfers ?PT goals addressed during session: Mobility/safety with mobility;Balance;Proper use of DME;Strengthening/ROM ?OT goals addressed during session: ADL's and self-care ?  ? ?  ?AM-PAC OT "6 Clicks" Daily Activity     ?Outcome Measure ? ? Help from another person eating meals?: A Little ?Help from another person taking care of personal grooming?: A Little ?Help from another person toileting, which includes using toliet, bedpan, or urinal?: A Lot ?Help from another person bathing (including washing, rinsing, drying)?: A Lot ?Help from another person to put on and taking off regular upper body clothing?: A Lot ?Help from another person to put on and taking off regular lower body clothing?: Total ?6 Click Score: 13 ? ?  ?End of Session Equipment Utilized During Treatment: Gait belt;Other (comment) Charlaine Dalton) ? ?OT Visit Diagnosis: Unsteadiness on feet (R26.81);Muscle weakness (generalized) (M62.81);Pain ?Pain - Right/Left: Right ?Pain - part of body: Hand;Knee ?  ?Activity Tolerance Patient tolerated treatment well ?  ?Patient Left in chair;with call bell/phone within reach;with chair alarm set;with family/visitor present ?  ?Nurse Communication Mobility status ?  ? ?   ? ?Time: 7628-3151 ?OT Time Calculation (min): 46 min ? ?Charges: OT General Charges ?$  OT Visit: 1 Visit ?OT Treatments ?$Self Care/Home Management : 8-22 mins ? ?Lodema Hong, OTA ?Acute Rehabilitation Services  ?Pager [phone] ?Office [phone] ? ? ?[place] ?05/07/2021, 12:42 PM ?

## 2021-05-07 NOTE — Progress Notes (Addendum)
Inpatient Rehabilitation Admissions Coordinator  ? ? I have begun insurance Auth with [facility] for a possible admit pending payor approval. I spoke with pt's wife by phone and she is aware. ? ?Danne Baxter, RN, MSN ?Rehab Admissions Coordinator ?([phone] ?05/07/2021 2:34 PM ? ?

## 2021-05-07 NOTE — Progress Notes (Addendum)
Physical Therapy Treatment ?Patient Details ?Name: Austin Wheeler ?MRN: [id number] ?DOB: May 21, 1940 ?Today's Date: 05/07/2021 ? ? ?History of Present Illness 81 y/o male who presents on 04/23/21 with SOB, weakness and swelling abdomen/LEs. Found to have new onset A-fib, AKI injury of unknown origin started on HD, s/p paracentesis 2/24. Renal biopsy planned fof 05/03/21. PMH includes DM, HTN, legally blind. ? ?  ?PT Comments  ? ? Pt received in recliner, eager for repositioning due to discomfort and for placement of geomat cushion under him for pressure relief. Emphasis on BLE A/AAROM exercises for strengthening and self-assist with repositioning and partial crunch forward (using arm rests to pull trunk upright x 5 reps). Utilized mechanical lift pad for placement of cushion under him safely as pt new bed had not yet arrived to room. Pt continues to benefit from PT services to progress toward functional mobility goals.   ?Recommendations for follow up therapy are one component of a multi-disciplinary discharge planning process, led by the attending physician.  Recommendations may be updated based on patient status, additional functional criteria and insurance authorization. ? ?Follow Up Recommendations ? Acute inpatient rehab (3hours/day) ?  ?  ?Assistance Recommended at Discharge Frequent or constant Supervision/Assistance  ?Patient can return home with the following Two people to help with walking and/or transfers;Help with stairs or ramp for entrance;Assistance with cooking/housework;Two people to help with bathing/dressing/bathroom ?  ?Equipment Recommendations ? Wheelchair (measurements PT);Wheelchair cushion (measurements PT);BSC/3in1 (may need bariatric size, he is 66f 6")  ?  ?Recommendations for Other Services   ? ? ?  ?Precautions / Restrictions Precautions ?Precautions: Fall ?Precaution Comments: legally blind, A-fib ?Restrictions ?Weight Bearing Restrictions: No  ?  ? ?Mobility ? Bed Mobility ?Overal bed  mobility: Needs Assistance ?Bed Mobility: Supine to Sit ?  ?  ?Supine to sit: Mod assist, +2 for physical assistance, HOB elevated ?  ?  ?General bed mobility comments: reclined position in chair to upright long sit with min guard to minA, pt pulling on armrests to raise trunk ?  ? ?Transfers ?Overall transfer level: Needs assistance ?Equipment used: Ambulation equipment used ?  ?  ?  ?  ?  ?General transfer comment: pt elevated from chair in maxi-move lift for placement of geomat cushion for pressure relief ?Transfer via Lift Equipment: MJennings? ?Ambulation/Gait ?  ?  ? Pre-gait activities: pt unable to initiate stepping today 2/2 fatigue after multiple standing transfers and toileting ?  ? ? ? ?  ?Balance Overall balance assessment: Needs assistance ?Sitting-balance support: Feet supported, No upper extremity supported ?Sitting balance-Leahy Scale: Poor ?Sitting balance - Comments: min guard to close Supervision for static sitting upright in recliner, intermittent U UE support ?  ? ?  ?Cognition Arousal/Alertness: Awake/alert ?[facility] ?Overall Cognitive Status: Within Functional Limits for tasks assessed ?   ?  ?General Comments: Requiring increased time and cues to initiate/perform tasks; anxious due to blindness and does need hand over hand assist to find equipment ?  ?  ? ?  ?Exercises General Exercises - Lower Extremity ?Ankle Circles/Pumps: Both, 10 reps, Supine, AROM ?Long Arc Quad: AAROM, Both, Seated, 5 reps ?Hip ABduction/ADduction: AAROM, Both, 5 reps, Supine ?Hip Flexion/Marching: AAROM, Both, 5 reps, Seated ?Other Exercises ?Other Exercises: pulling forward on chair armrests from reclined posture to long sitting posture in chair x5 reps ? ?  ?General Comments General comments (skin integrity, edema, etc.): HR 104 bpm resting and to 130's bpm with positional changes/exertion ?  ?  ? ?  Pertinent Vitals/Pain Pain Assessment ?Pain Assessment: Faces ?Faces  Pain Scale: Hurts little more ?Pain Location: L wrist (IV at wrist), knees ?Pain Descriptors / Indicators: Grimacing, Guarding, Discomfort, Aching ?Pain Intervention(s): Monitored during session, Repositioned  ? ? ? ?PT Goals (current goals can now be found in the care plan section) Acute Rehab PT Goals ?Patient Stated Goal: decrease pain, to move more ?PT Goal Formulation: With patient ?Time For Goal Achievement: 05/14/21 ?Progress towards PT goals: Progressing toward goals ? ?  ?Frequency ? ? ? Min 3X/week ? ? ? ?  ?PT Plan Current plan remains appropriate  ? ? ?   ?AM-PAC PT "6 Clicks" Mobility   ?Outcome Measure ? Help needed turning from your back to your side while in a flat bed without using bedrails?: A Lot ?Help needed moving from lying on your back to sitting on the side of a flat bed without using bedrails?: A Lot ?Help needed moving to and from a bed to a chair (including a wheelchair)?: Total ?Help needed standing up from a chair using your arms (e.g., wheelchair or bedside chair)?: A Lot ?Help needed to walk in hospital room?: Total ?Help needed climbing 3-5 steps with a railing? : Total ?6 Click Score: 9 ? ?  ?End of Session Equipment Utilized During Treatment: Gait belt ?Activity Tolerance: Patient tolerated treatment well ?Patient left: with call bell/phone within reach;with family/visitor present;in chair;with chair alarm set;Other (comment) (hoyer lift pad under him) ?Nurse Communication: Mobility status;Other (comment);Need for lift equipment (bed tagged -hydraulics at end not working, geomat ordered for his chair, needs a new bed; tachy w/exertion) ?PT Visit Diagnosis: Pain;Muscle weakness (generalized) (M62.81);Difficulty in walking, not elsewhere classified (R26.2) ?Pain - Right/Left: Left ?Pain - part of body:  (wrist at IV site) ?  ? ? ?Time: 8099-8338 ?PT Time Calculation (min) (ACUTE ONLY): 10 min ? ?Charges:  $Therapeutic Exercise: 8-22 mins         ?          ? ?Freman Lapage P., PTA ?Acute  Rehabilitation Services ?Pager: [phone] ?Office: [phone]  ? ? ?Kara Pacer Zebulan Hinshaw ?05/07/2021, 1:06 PM ? ?

## 2021-05-07 NOTE — Care Management Important Message (Signed)
Important Message ? ?Patient Details  ?Name: Austin Wheeler ?MRN: [id number] ?Date of Birth: [birth year] ? ? ?Medicare Important Message Given:  Yes ? ? ? ? ?Shelda Altes ?05/07/2021, 9:33 AM ?

## 2021-05-07 NOTE — Progress Notes (Addendum)
Nutrition Follow-up ? ?DOCUMENTATION CODES:  ? ?Severe malnutrition in context of acute illness/injury ? ?INTERVENTION:  ? ?Continue Ensure Enlive po TID, each supplement provides 350 kcal and 20 grams of protein. Do not substitute with Nepro.  ? ?Continue dysphagia 3 diet with thin liquids. Add pureed meats per patient request for easier chewing. Renal diet restrictions are not needed at this time as phosphorus is low and potassium is WNL. ? ?Continue Renal MVI daily. ? ?Continue bowel regimen. ? ?NUTRITION DIAGNOSIS:  ? ?Severe Malnutrition related to acute illness (AKI) as evidenced by moderate fat depletion, moderate muscle depletion, severe muscle depletion. ? ?Ongoing  ? ?GOAL:  ? ?Patient will meet greater than or equal to 90% of their needs ? ?Progressing  ? ?MONITOR:  ? ?PO intake, Supplement acceptance, Labs, I & O's ? ?REASON FOR ASSESSMENT:  ? ?Rounds ?  ? ?ASSESSMENT:  ? ?81 yo male admitted with AKI. PMH includes HTN, DM, BPH, legally blind both eyes. ? ?S/P HD 3/9. ?Next HD 3/11.  ?Patient is currently on a dysphagia 3 diet with thin liquids. ?Meal intakes documented at 0-20%. ?Receiving Ensure supplements between meals.  ?RN has been providing Nepro because he is on dialysis, but he does not like Nepro; he prefers Ensure. Discussed with patient and his wife that Ensure supplements are okay for him to drink since Phos and potassium levels are not high. In addition, he is severely malnourished and consuming 0-20% of meals. Diet restrictions of any kind are not recommended at this time.  ?He c/o difficulty chewing meats and agreed that pureed meats would be easier to chew. Will add pureed meats to diet order.  ?Discussed renal diet recommendations and provided handout to patient's wife. Encouraged adequate protein and calorie intake.  ? ?Labs reviewed. Na 132, phos 2.1 ?CBG: no results today ? ?Medications reviewed and include  Renal MVI, Senokot-S. ? ?Weight continues to decrease. ?Currently 111 kg,  down from 134.7 kg on admission. ?Weight loss is likely a combination of fluids and decreased LBM. ? ?Diet Order:   ?Diet Order   ? ?       ?  DIET DYS 3 Room service appropriate? Yes; Fluid consistency: Thin  Diet effective now       ?  ? ?  ?  ? ?  ? ? ?EDUCATION NEEDS:  ? ?Education needs have been addressed ? ?Skin:  Skin Assessment: Reviewed RN Assessment ? ?Last BM:  3/10 ? ?Height:  ? ?Ht Readings from Last 1 Encounters:  ?04/23/21 '6\' 6"'$  (1.981 m)  ? ? ?Weight:  ? ?Wt Readings from Last 1 Encounters:  ?05/07/21 111.1 kg  ? ? ?Ideal Body Weight:  97.3 kg ? ?BMI:  Body mass index is 28.31 kg/m?. ? ?Estimated Nutritional Needs:  ? ?Kcal:  2400 - 2600 ? ?Protein:  120 - 135 grams ? ?Fluid:  > 2.4 L ? ? ? ?Lucas Mallow RD, LDN, CNSC ?Please refer to Amion for contact information.                                                       ? ?

## 2021-05-07 NOTE — Progress Notes (Signed)
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   Patient started to have increasing bilateral lower extremities swelling about 3 months ago and was started of po Lasix 40 mg daily, swelling appears to be improving, patient however had episodes of feeling lightheadedness and "too much urination" and decided to cut down Lasix from 40 mg daily to 20 mg daily about 1 month ago. Condition remained stable since until about 1 week ago, patient started to notice swelling in his abdomen and legs, and decreased urine output.  Finally, for the past 3 days, there has been barely any urine came out. He said he feeling "bladder fullness" when having BM, but no urine came out. No abdominal pain, no back pain, no shortness of breath, no fever or chills. No Hx of CHF or stroke  2/24: Admitted to [facility] for acute on chronic renal failure, new onset a. Fib. Nephrology consulted; high dose Lasix trial initiated, large volume paracentesis of 8.3 L 2/26: PCCM consulted. Transfer to ICU for CRRT  2/27: tolerated CRRT 3/1 Heparin drip restarted 2/28 and patient has had recurrent hematuria and bleeding IV site. Heparin drip now on hold  3/6 - Renal biopsy obtained 3/7 - transition to iHD, tolerating well 3/8 -3/10 --> Awaiting transfer to CIR; continue to discuss goals of care with palliative team   Assessment and Plan:  Acute Kidney Failure with Anuria requiring RRT - now transitioning to HD Anasarca, resolving - Nephro following - continues on HD - Patient with acute kidney injury of unclear origin. Differential includes ATN versus nephrotic syndrome versus acute GN given hematuria. 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   - CHADVASC score of 4 (age, HTN, DM), initially holding anticoagulation in setting of gross hematuria.  - 3/7 Heparin drip - discontinued - 3/9 transition to Eliquis 2.5 twice daily given age and GFR   Acute anemia, acute blood loss anemia unlikely chronic anemia of chronic disease.  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 - Continue home ophthalmic meds   Hyponatremia, improving - Likely secondary to volume overload - Continue on dialysis with nephrology  Thrombocytopenia.  - Resolved   Type 2 DM with Hypoglycemia.  - Hypoglycemia resolved.  - CBGs within goal  Hyperkalemia - Resolved with HD  Metabolic acidosis  -Resolved  Gout, history of -Patient complaining of diffuse joint pain today, uric acid low; low risk for acute gout flare   Please note:  Medication Issues; Preferred narcotic agents for pain control are hydromorphone, fentanyl, and methadone. Morphine should not be used.  Baclofen should be avoided Avoid oral sodium phosphate and magnesium citrate based laxatives / bowel preps   DVT prophylaxis: Eliquis Code Status: DNR Family Communication: Wife at bedside Disposition Plan: CIR -pending bed availability and  insurance approval Status is: Inpatient Remains inpatient appropriate because: IV treatment, getting HD, CIR pending   Consultants:  Pccm, nephrology , IR  Subjective: No acute issues or events overnight,  lengthy discussion at bedside today with patient and wife about ongoing stiffness in joints, likely secondary to prolonged bedbound status and weakness.  Discussed need for ongoing mobility even if in bed including range of motion exercises given his left grip and elbow range of motion appear to be limited secondary to pain.  Objective: Vitals:   05/06/21 1639 05/06/21 1900 05/06/21 2324 05/07/21 0341  BP: 109/74 107/79 106/72 109/74  Pulse: 89 97 80 94  Resp: '16 15 18 17  '$ Temp: (!) 97.5 F (36.4 C) 97.8 F (36.6 C) 98.1 F (36.7 C) 97.8 F (36.6 C)  TempSrc: Oral Oral Oral Oral  SpO2: 97% 99% 100% 99%  Weight:    111.1 kg  Height:        Intake/Output Summary (Last 24 hours) at 05/07/2021 0737 Last data filed at 05/07/2021 0343 Gross per 24 hour  Intake 10 ml  Output 4050 ml  Net -4040 ml    Filed Weights   05/06/21 0950 05/06/21 1335 05/07/21 0341  Weight: 115 kg 111 kg 111.1 kg    Examination: Calm, NAD Clear to auscultate without wheezes or rales Reg s1/s2 no gallop Soft benign +bs mildly distended +1 edema b/l x4 Mood and affect appropriate in current setting   Data Reviewed: I have personally reviewed following labs and imaging studies  CBC: Recent Labs  Lab 05/01/21 0330 05/02/21 1028 05/03/21 0558 05/04/21 0144 05/06/21 0203  WBC 6.0 6.7 8.1 6.1 7.4  NEUTROABS  --   --   --   --  5.6  HGB 10.0* 9.2* 9.4* 9.5* 9.3*  HCT 28.8* 27.8* 28.3* 28.0* 27.3*  MCV 94.7 95.2 95.6 94.6 95.1  PLT 142* 155 169 164 737    Basic Metabolic Panel: Recent Labs  Lab 05/03/21 0558 05/03/21 1633 05/04/21 0144 05/04/21 1614 05/05/21 0134 05/05/21 1524 05/06/21 0203 05/06/21 1547 05/07/21 0411  NA 132*   < > 129*   < > 130* 133* 132* 135 132*  K 3.9   < > 4.0   < > 3.7 4.0 4.2 3.7 3.8  CL 97*   < > 95*   < > 96* 98 98 98 95*  CO2 27   < > 26   < > '26 27 25 28 27  '$ GLUCOSE 101*   < > 153*   < > 115* 133* 129* 104* 108*  BUN 28*   < > 37*   < > 27* 30* 34* 19 24*   CREATININE 5.05*   < > 5.75*   < > 4.61* 5.25* 5.53* 4.00* 4.47*  CALCIUM 8.2*   < > 8.2*   < > 8.2* 8.2* 8.4* 8.5* 8.5*  MG 2.1  --  2.2  --  2.1  --  2.1  --  2.1  PHOS 2.4*   < > 2.4*   < > 2.0* 2.0* 2.0* 1.7* 2.1*   < > = values in this interval not displayed.    GFR: Estimated Creatinine Clearance: 18.5 mL/min (A) (by C-G formula based on SCr of 4.47 mg/dL (H)). Liver Function Tests: Recent Labs  Lab 05/05/21 0134 05/05/21 1524 05/06/21 0203 05/06/21 1547 05/07/21 0411  ALBUMIN 2.4* 2.4* 2.4* 2.6* 2.5*    No results for input(s): LIPASE, AMYLASE in the last 168 hours. No results for input(s):  AMMONIA in the last 168 hours. Coagulation Profile: No results for input(s): INR, PROTIME in the last 168 hours.  Cardiac Enzymes: No results for input(s): CKTOTAL, CKMB, CKMBINDEX, TROPONINI in the last 168 hours.  BNP (last 3 results) No results for input(s): PROBNP in the last 8760 hours. HbA1C: No results for input(s): HGBA1C in the last 72 hours. CBG: Recent Labs  Lab 05/05/21 1121 05/05/21 1609 05/05/21 2128 05/06/21 0602 05/06/21 1428  GLUCAP 114* 126* 143* 109* 92    Lipid Profile: No results for input(s): CHOL, HDL, LDLCALC, TRIG, CHOLHDL, LDLDIRECT in the last 72 hours. Thyroid Function Tests: No results for input(s): TSH, T4TOTAL, FREET4, T3FREE, THYROIDAB in the last 72 hours. Anemia Panel: No results for input(s): VITAMINB12, FOLATE, FERRITIN, TIBC, IRON, RETICCTPCT in the last 72 hours. Sepsis Labs: No results for input(s): PROCALCITON, LATICACIDVEN in the last 168 hours.   No results found for this or any previous visit (from the past 240 hour(s)).   Radiology Studies: No results found.      Scheduled Meds:  apixaban  2.5 mg Oral BID   aspirin EC  81 mg Oral Daily   atropine  1 drop Right Eye QHS   brimonidine  1 drop Left Eye TID   Chlorhexidine Gluconate Cloth  6 each Topical Q0600   dorzolamide-timolol  1 drop Left Eye BID   doxazosin   8 mg Oral Daily   dutasteride  0.5 mg Oral Q M,W,F   feeding supplement  237 mL Oral TID BM   latanoprost  1 drop Left Eye QHS   mouth rinse  15 mL Mouth Rinse BID   multivitamin with minerals  1 tablet Oral Daily   pantoprazole  40 mg Oral Daily   pilocarpine  1 drop Left Eye TID   polyethylene glycol  17 g Oral BID   prednisoLONE acetate  1 drop Right Eye QHS   senna-docusate  1 tablet Oral BID   sodium chloride flush  10-40 mL Intracatheter Q12H   triamcinolone ointment  1 application. Topical Daily   Continuous Infusions:  sodium chloride Stopped (04/28/21 1032)    Assessment & Plan:   Principal Problem:   AKI (acute kidney injury) ([place]) Active Problems:   Pressure injury of skin   Protein-calorie malnutrition, severe    LOS: 14 days   Time spent: 78mn  Watson Robarge C Aibhlinn Kalmar, DO [facility]  If 7PM-7AM, please contact night-coverage 05/07/2021, 7:37 AM

## 2021-05-07 NOTE — Progress Notes (Signed)
Mobility Specialist Progress Note ? ? 05/07/21 1200  ?Mobility  ?Activity  ?(Repositioned in chair)  ?Level of Assistance +2 (takes two people)  ?Assistive Device MaxiMove  ?Activity Response Tolerated fair  ?$Mobility charge 1 Mobility  ? ? Pt c/o of being uncomfortable in chair, repositioned w/ maximove and placed geomat under pt. Pt stating to be feeling better. Left w/ RN in room.  ? ?Holland Falling ?Mobility Specialist ?Phone Number [phone] ? ?

## 2021-05-07 NOTE — Progress Notes (Signed)
[facility] ROUNDING NOTE   Subjective:   Interval History: 81 year old gentleman hypertension diabetes prostate cancer with surveillance.  Patient was admitted with increasing bilateral lower extremity swelling.  Hospital course complicated by new onset atrial fibrillation.  Large-volume paracentesis 8.3 L.  Was transferred to the ICU 04/25/2021 for CRRT-04/28/2021.  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   .  Last dialysis 05/06/2021 with 4 L removed.  Renal biopsy performed 05/03/2021.  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  Recovery not expected  Blood pressure 109/74 pulse 89 temperature 97.8 O2 sats 97% room air  Urine output minimal  Sodium 132 potassium 3.8 chloride 95 CO2 27 BUN 24 creatinine 4.47 glucose 108 calcium 8.5 phosphorus 2.1 albumin 2.5 hemoglobin 9.3   Objective:  Vital signs in last 24 hours:  Temp:  [97.5 F (36.4 C)-98.5 F (36.9 C)] 97.8 F (36.6 C) (03/10 0341) Pulse Rate:  [80-108] 94 (03/10 0341) Resp:  [12-18] 17 (03/10 0341) BP: (103-120)/(64-88) 109/74 (03/10 0341) SpO2:  [97 %-100 %] 99 % (03/10 0341) Weight:  [416 kg-115 kg] 111.1 kg (03/10 0341)  Weight change: 0 kg Filed Weights   05/06/21 0950 05/06/21 1335 05/07/21 0341  Weight: 115 kg 111 kg 111.1 kg    Intake/Output: I/O last 3 completed shifts: In: 250 [P.O.:240; I.V.:10] Out: 4050 [Urine:50; Other:4000]   Intake/Output this shift:  No intake/output data recorded.  Blind  CVS- RRR no murmurs rubs gallops RS- CTA no wheezes or rales ABD- BS present soft non-distended EXT-2-3+ lower extremity edema.   Basic Metabolic Panel: Recent Labs  Lab 05/03/21 0558 05/03/21 1633 05/04/21 0144 05/04/21 1614 05/05/21 0134 05/05/21 1524 05/06/21 0203 05/06/21 1547 05/07/21 0411  NA 132*   < > 129*   < > 130* 133* 132* 135 132*  K 3.9   < > 4.0    < > 3.7 4.0 4.2 3.7 3.8  CL 97*   < > 95*   < > 96* 98 98 98 95*  CO2 27   < > 26   < > '26 27 25 28 27  '$ GLUCOSE 101*   < > 153*   < > 115* 133* 129* 104* 108*  BUN 28*   < > 37*   < > 27* 30* 34* 19 24*  CREATININE 5.05*   < > 5.75*   < > 4.61* 5.25* 5.53* 4.00* 4.47*  CALCIUM 8.2*   < > 8.2*   < > 8.2* 8.2* 8.4* 8.5* 8.5*  MG 2.1  --  2.2  --  2.1  --  2.1  --  2.1  PHOS 2.4*   < > 2.4*   < > 2.0* 2.0* 2.0* 1.7* 2.1*   < > = values in this interval not displayed.     Liver Function Tests: Recent Labs  Lab 05/05/21 0134 05/05/21 1524 05/06/21 0203 05/06/21 1547 05/07/21 0411  ALBUMIN 2.4* 2.4* 2.4* 2.6* 2.5*    No results for input(s): LIPASE, AMYLASE in the last 168 hours. No results for input(s): AMMONIA in the last 168 hours.  CBC: Recent Labs  Lab 05/01/21 0330 05/02/21 1028 05/03/21 0558 05/04/21 0144 05/06/21 0203  WBC 6.0 6.7 8.1 6.1 7.4  NEUTROABS  --   --   --   --  5.6  HGB 10.0* 9.2* 9.4* 9.5* 9.3*  HCT 28.8* 27.8* 28.3* 28.0* 27.3*  MCV 94.7 95.2 95.6 94.6 95.1  PLT 142*  155 169 164 156     Cardiac Enzymes: No results for input(s): CKTOTAL, CKMB, CKMBINDEX, TROPONINI in the last 168 hours.   BNP: Invalid input(s): POCBNP  CBG: Recent Labs  Lab 05/05/21 1121 05/05/21 1609 05/05/21 2128 05/06/21 0602 05/06/21 1428  GLUCAP 114* 126* 143* 109* 92     Microbiology: Results for orders placed or performed during the hospital encounter of 04/23/21  Resp Panel by RT-PCR (Flu A&B, Covid) Nasopharyngeal Swab     Status: None   Collection Time: 04/23/21 12:23 PM   Specimen: Nasopharyngeal Swab; Nasopharyngeal(NP) swabs in vial transport medium  Result Value Ref Range Status   SARS Coronavirus 2 by RT PCR NEGATIVE NEGATIVE Final    Comment: (NOTE) SARS-CoV-2 target nucleic acids are NOT DETECTED.  The SARS-CoV-2 RNA is generally detectable in upper respiratory specimens during the acute phase of infection. The lowest concentration of  SARS-CoV-2 viral copies this assay can detect is 138 copies/mL. A negative result does not preclude SARS-Cov-2 infection and should not be used as the sole basis for treatment or other patient management decisions. A negative result may occur with  improper specimen collection/handling, submission of specimen other than nasopharyngeal swab, presence of viral mutation(s) within the areas targeted by this assay, and inadequate number of viral copies(<138 copies/mL). A negative result must be combined with clinical observations, patient history, and epidemiological information. The expected result is Negative.  Fact Sheet for Patients:  EntrepreneurPulse.com.au  Fact Sheet for Healthcare Providers:  IncredibleEmployment.be  This test is no t yet approved or cleared by the [place] FDA and  has been authorized for detection and/or diagnosis of SARS-CoV-2 by FDA under an Emergency Use Authorization (EUA). This EUA will remain  in effect (meaning this test can be used) for the duration of the COVID-19 declaration under Section 564(b)(1) of the Act, 21 U.S.C.section 360bbb-3(b)(1), unless the authorization is terminated  or revoked sooner.       Influenza A by PCR NEGATIVE NEGATIVE Final   Influenza B by PCR NEGATIVE NEGATIVE Final    Comment: (NOTE) The Xpert Xpress SARS-CoV-2/FLU/RSV plus assay is intended as an aid in the diagnosis of influenza from Nasopharyngeal swab specimens and should not be used as a sole basis for treatment. Nasal washings and aspirates are unacceptable for Xpert Xpress SARS-CoV-2/FLU/RSV testing.  Fact Sheet for Patients: EntrepreneurPulse.com.au  Fact Sheet for Healthcare Providers: IncredibleEmployment.be  This test is not yet approved or cleared by the [place] FDA and has been authorized for detection and/or diagnosis of SARS-CoV-2 by FDA under an Emergency Use  Authorization (EUA). This EUA will remain in effect (meaning this test can be used) for the duration of the COVID-19 declaration under Section 564(b)(1) of the Act, 21 U.S.C. section 360bbb-3(b)(1), unless the authorization is terminated or revoked.  Performed at [facility], [place] [address], [place], [place] [address]   MRSA Next Gen by PCR, Nasal     Status: None   Collection Time: 04/25/21 11:41 AM   Specimen: Nasal Mucosa; Nasal Swab  Result Value Ref Range Status   MRSA by PCR Next Gen NOT DETECTED NOT DETECTED Final    Comment: (NOTE) The GeneXpert MRSA Assay (FDA approved for NASAL specimens only), is one component of a comprehensive MRSA colonization surveillance program. It is not intended to diagnose MRSA infection nor to guide or monitor treatment for MRSA infections. Test performance is not FDA approved in patients less than 41 years old. Performed at [facility]  Lab, [address] [place], [place] [address]     Coagulation Studies: No results for input(s): LABPROT, INR in the last 72 hours.  Urinalysis: No results for input(s): COLORURINE, LABSPEC, PHURINE, GLUCOSEU, HGBUR, BILIRUBINUR, KETONESUR, PROTEINUR, UROBILINOGEN, NITRITE, LEUKOCYTESUR in the last 72 hours.  Invalid input(s): APPERANCEUR    Imaging: No results found.   Medications:    sodium chloride Stopped (04/28/21 1032)    apixaban  2.5 mg Oral BID   aspirin EC  81 mg Oral Daily   atropine  1 drop Right Eye QHS   brimonidine  1 drop Left Eye TID   Chlorhexidine Gluconate Cloth  6 each Topical Q0600   dorzolamide-timolol  1 drop Left Eye BID   doxazosin  8 mg Oral Daily   dutasteride  0.5 mg Oral Q M,W,F   feeding supplement  237 mL Oral TID BM   latanoprost  1 drop Left Eye QHS   mouth rinse  15 mL Mouth Rinse BID   multivitamin with minerals  1 tablet Oral Daily   pantoprazole  40 mg Oral Daily   pilocarpine  1 drop Left Eye TID   polyethylene glycol  17 g Oral BID    prednisoLONE acetate  1 drop Right Eye QHS   senna-docusate  1 tablet Oral BID   sodium chloride flush  10-40 mL Intracatheter Q12H   triamcinolone ointment  1 application. Topical Daily   sodium chloride, acetaminophen, bisacodyl, HYDROcodone-acetaminophen, metoprolol tartrate, oxyCODONE, simethicone, sodium chloride flush  Assessment/ Plan:  Dialysis dependent acute kidney injury of uncertain etiology.  Unresponsive to IV diuretics has received CRRT 04/25/2021-04/28/2021.  Next dialysis will be 05/08/2021.   We will need to consult vein and vascular surgery for vascular access. Acute kidney injury uncertain etiology.  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 Dialysis access.  Tunneled dialysis catheter. Hyperkalemia resolved Anemia mild Metabolic acidosis resolved Diabetic mellitus as per primary service Atrial fibrillation as per primary service   LOS: [place] '@TODAY''@7'$ :34 AM

## 2021-05-07 NOTE — Progress Notes (Signed)
Physical Therapy Treatment ?Patient Details ?Name: Austin Wheeler ?MRN: [id number] ?DOB: [birth year] ?Today's Date: 05/07/2021 ? ? ?History of Present Illness 81 y/o male who presents on 04/23/21 with SOB, weakness and swelling abdomen/LEs. Found to have new onset A-fib, AKI injury of unknown origin started on HD, s/p paracentesis 2/24. Renal biopsy planned fof 05/03/21. PMH includes DM, HTN, legally blind. ? ?  ?PT Comments  ? ? Pt received supine, agreeable to therapy session and with good participation and tolerance for transfer training. Pt was able to have BM after transfer to [facility] and needing +2 modA to maxA for sit>stand and totalA for pivotal transfers from bed to [facility] and BSC to recliner chair. Pt up in chair with heels floated and lift pad under him for safety at end of session, RN notified. HR 120-140 bpm with exertion, occasional spikes to 150 bpm and VSS otherwise on RA, pt reports moderate fatigue (6/10 modified RPE) at end of session. Pt continues to benefit from PT services to progress toward functional mobility goals.    ?Recommendations for follow up therapy are one component of a multi-disciplinary discharge planning process, led by the attending physician.  Recommendations may be updated based on patient status, additional functional criteria and insurance authorization. ? ?Follow Up Recommendations ? Acute inpatient rehab (3hours/day) ?  ?  ?Assistance Recommended at Discharge Frequent or constant Supervision/Assistance  ?Patient can return home with the following Two people to help with walking and/or transfers;Help with stairs or ramp for entrance;Assistance with cooking/housework;Two people to help with bathing/dressing/bathroom ?  ?Equipment Recommendations ? Wheelchair (measurements PT);Wheelchair cushion (measurements PT);BSC/3in1 (may need bariatric size, he is 82f 6")  ?  ?Recommendations for Other Services   ? ? ?  ?Precautions / Restrictions Precautions ?Precautions: Fall ?Precaution Comments:  legally blind, A-fib ?Restrictions ?Weight Bearing Restrictions: No  ?  ? ?Mobility ? Bed Mobility ?Overal bed mobility: Needs Assistance ?Bed Mobility: Supine to Sit ?  ?  ?Supine to sit: Mod assist, +2 for physical assistance, HOB elevated ?  ?  ?General bed mobility comments: Pt assisting bringing BLE's to edge of bed, hand over hand guidance for locating rail, use of bed pad to scoot hips forward and assist at trunk to power up. Able to perform weight shift for anterior scoot with tactile cues and min/modA bed pad assist. ?  ? ?Transfers ?Overall transfer level: Needs assistance ?Equipment used: Ambulation equipment used ?Transfers: Sit to/from Stand, Bed to chair/wheelchair/BSC ?Sit to Stand: Mod assist, Max assist, +2 physical assistance, From elevated surface ?  ?  ?  ?  ?  ?General transfer comment: elevated bed height>Stedy, then BSC<>Stedy and Stedy seat>recliner ?Transfer via Lift Equipment: Stedy ? ?Ambulation/Gait ?  ?  ?  ?  ?  ?  ?Pre-gait activities: pt unable to initiate stepping today 2/2 fatigue after multiple standing transfers and toileting ?  ? ? ? ?  ?Balance Overall balance assessment: Needs assistance ?Sitting-balance support: Feet supported, No upper extremity supported ?Sitting balance-Leahy Scale: Poor ?Sitting balance - Comments: min guard to close Supervision for static sitting EOB, intermittent U UE support ?  ?Standing balance support: During functional activity ?Standing balance-Leahy Scale: Poor ?Standing balance comment: Able to stand in stedy for 1-2 mins with cues for hip extension/upright during peri-care after toileting ?  ?  ?  ?  ?  ?Cognition Arousal/Alertness: Awake/alert ?Behavior During Therapy: [facility]/performed ?Overall Cognitive Status: Within Functional Limits for tasks assessed ?  ?  ?  ?  ?  ?  ?  General Comments: Requiring increased time and cues to initiate/perform tasks; anxious due to blindness and does need hand over hand assist to find [place]  handle. Does better with repetition of portions of sequence for mobility tasks such as practicing "lean forward" for use of momentum strategy prior to attempting stand. ?  ?  ? ?  ?Exercises General Exercises - Lower Extremity ?Ankle Circles/Pumps: Both, 10 reps, Supine, AROM ? ?  ?General Comments   ?  ?  ? ?Pertinent Vitals/Pain Pain Assessment ?Pain Assessment: Faces ?Faces Pain Scale: Hurts little more ?Pain Location: L wrist (IV at wrist), knees ?Pain Descriptors / Indicators: Grimacing, Guarding, Discomfort, Aching ?Pain Intervention(s): Monitored during session, Repositioned  ? ? ? ?PT Goals (current goals can now be found in the care plan section) Acute Rehab PT Goals ?Patient Stated Goal: decrease pain, to move more ?PT Goal Formulation: With patient ?Time For Goal Achievement: 05/14/21 ?Progress towards PT goals: Progressing toward goals ? ?  ?Frequency ? ? ? Min 3X/week ? ? ? ?  ?PT Plan Current plan remains appropriate  ? ? ?Co-evaluation PT/OT/SLP Co-Evaluation/Treatment: Yes ?Reason for Co-Treatment: Necessary to address cognition/behavior during functional activity;For patient/therapist safety;To address functional/ADL transfers ?PT goals addressed during session: Mobility/safety with mobility;Balance;Proper use of DME;Strengthening/ROM ?  ?  ? ?  ?AM-PAC PT "6 Clicks" Mobility   ?Outcome Measure ? Help needed turning from your back to your side while in a flat bed without using bedrails?: A Lot ?Help needed moving from lying on your back to sitting on the side of a flat bed without using bedrails?: A Lot ?Help needed moving to and from a bed to a chair (including a wheelchair)?: Total ?Help needed standing up from a chair using your arms (e.g., wheelchair or bedside chair)?: A Lot ?Help needed to walk in hospital room?: Total ?Help needed climbing 3-5 steps with a railing? : Total ?6 Click Score: 9 ? ?  ?End of Session Equipment Utilized During Treatment: Gait belt ?Activity Tolerance: Patient  tolerated treatment well ?Patient left: with call bell/phone within reach;with family/visitor present;in chair;with chair alarm set;Other (comment) (hoyer lift pad under him) ?Nurse Communication: Mobility status;Other (comment);Need for lift equipment (bed tagged -hydraulics at end not working, geomat ordered for his chair, needs a new bed; tachy w/exertion) ?PT Visit Diagnosis: Pain;Muscle weakness (generalized) (M62.81);Difficulty in walking, not elsewhere classified (R26.2) ?Pain - Right/Left: Left ?Pain - part of body:  (wrist at IV site) ?  ? ? ?Time: 5974-1638 ?PT Time Calculation (min) (ACUTE ONLY): 46 min ? ?Charges:  $Therapeutic Activity: 23-37 mins          ?          ? ?Dillan Lunden P., PTA ?Acute Rehabilitation Services ?Pager: [phone] ?Office: [phone]  ? ? ?Kara Pacer Kari Kerth ?05/07/2021, 11:01 AM ? ?

## 2021-05-08 DIAGNOSIS — N179 Acute kidney failure, unspecified: Secondary | ICD-10-CM | POA: Diagnosis not present

## 2021-05-08 LAB — RENAL FUNCTION PANEL
Albumin: 2.3 g/dL — ABNORMAL LOW (ref 3.5–5.0)
Albumin: 2.4 g/dL — ABNORMAL LOW (ref 3.5–5.0)
Anion gap: 11 (ref 5–15)
Anion gap: 11 (ref 5–15)
BUN: 33 mg/dL — ABNORMAL HIGH (ref 8–23)
BUN: 19 mg/dL (ref 8–23)
CO2: 24 mmol/L (ref 22–32)
CO2: 25 mmol/L (ref 22–32)
Calcium: 8.6 mg/dL — ABNORMAL LOW (ref 8.9–10.3)
Calcium: 8.5 mg/dL — ABNORMAL LOW (ref 8.9–10.3)
Chloride: 97 mmol/L — ABNORMAL LOW (ref 98–111)
Chloride: 96 mmol/L — ABNORMAL LOW (ref 98–111)
Creatinine, Ser: 5.29 mg/dL — ABNORMAL HIGH (ref 0.61–1.24)
Creatinine, Ser: 3.67 mg/dL — ABNORMAL HIGH (ref 0.61–1.24)
GFR, Estimated: 10 mL/min — ABNORMAL LOW (ref >60)
GFR, Estimated: 16 mL/min — ABNORMAL LOW (ref >60)
Glucose, Bld: 130 mg/dL — ABNORMAL HIGH (ref 70–99)
Glucose, Bld: 116 mg/dL — ABNORMAL HIGH (ref 70–99)
Phosphorus: 2.1 mg/dL — ABNORMAL LOW (ref 2.5–4.6)
Phosphorus: 1.8 mg/dL — ABNORMAL LOW (ref 2.5–4.6)
Potassium: 3.9 mmol/L (ref 3.5–5.1)
Potassium: 3.4 mmol/L — ABNORMAL LOW (ref 3.5–5.1)
Sodium: 132 mmol/L — ABNORMAL LOW (ref 135–145)
Sodium: 132 mmol/L — ABNORMAL LOW (ref 135–145)

## 2021-05-08 LAB — CBC
HCT: 26.1 % — ABNORMAL LOW (ref 39.0–52.0)
Hemoglobin: 8.9 g/dL — ABNORMAL LOW (ref 13.0–17.0)
MCH: 32.4 pg (ref 26.0–34.0)
MCHC: 34.1 g/dL (ref 30.0–36.0)
MCV: 94.9 fL (ref 80.0–100.0)
Platelets: 183 10*3/uL (ref 150–400)
RBC: 2.75 MIL/uL — ABNORMAL LOW (ref 4.22–5.81)
RDW: 14 % (ref 11.5–15.5)
WBC: 7.4 10*3/uL (ref 4.0–10.5)
nRBC: 0 % (ref 0.0–0.2)

## 2021-05-08 LAB — MAGNESIUM: Magnesium: 2 mg/dL (ref 1.7–2.4)

## 2021-05-08 MED ORDER — ALTEPLASE 2 MG IJ SOLR: 2.0000 mg | Freq: Once | INTRAMUSCULAR | Status: DC | PRN

## 2021-05-08 MED ORDER — PENTAFLUOROPROP-TETRAFLUOROETH EX AERO: 1.0000 "application " | INHALATION_SPRAY | CUTANEOUS | Status: DC | PRN

## 2021-05-08 MED ORDER — LIDOCAINE HCL (PF) 1 % IJ SOLN: 5.0000 mL | INTRAMUSCULAR | Status: DC | PRN

## 2021-05-08 MED ORDER — SODIUM CHLORIDE 0.9 % IV SOLN: 100.0000 mL | INTRAVENOUS | Status: DC | PRN

## 2021-05-08 MED ORDER — SODIUM CHLORIDE 0.9 % IR SOLN: 3000.0000 mL | Status: DC

## 2021-05-08 MED ORDER — LIDOCAINE-PRILOCAINE 2.5-2.5 % EX CREA: 1.0000 "application " | TOPICAL_CREAM | CUTANEOUS | Status: DC | PRN

## 2021-05-08 MED ORDER — HEPARIN SODIUM (PORCINE) 1000 UNIT/ML DIALYSIS: 1000.0000 [IU] | INTRAMUSCULAR | Status: DC | PRN

## 2021-05-08 MED ORDER — K PHOS MONO-SOD PHOS DI & MONO 155-852-130 MG PO TABS
500.0000 mg | ORAL_TABLET | Freq: Two times a day (BID) | ORAL | Status: AC
  Administered 2021-05-08 (×2): 500 mg via ORAL
  Filled 2021-05-08 (×2): qty 2

## 2021-05-08 NOTE — Progress Notes (Addendum)
Foley only had an output of 20 cc tonight. Bladder scan showed 276 cc in bladder.  Informed on call physician. Will continue to monitor.   ? ?Lupita Dawn, RN ? ?

## 2021-05-08 NOTE — Progress Notes (Addendum)
HOSPITAL MEDICINE OVERNIGHT EVENT NOTE   ? ?Notified by nursing that patient has only had 20 cc of urine output via the Foley catheter overnight.  Nursing notes some blood-tinged urine without clots.  Nursing has attempted to flush the catheter without increased output. ? ?I requested a bladder scan to ensure that the Foley catheter is draining and it revealed 276 cc in the bladder indicating a misplaced or dysfunctional catheter. ? ?Nursing has stated that this is happened several times in the past.  Initially asked that they attempt to adjust Foley catheter placement to see if it would relieve the obstruction but this was unsuccessful.  I then advised nursing that the Foley catheter will need to be replaced. ? ?Austin Emerald  MD ?[facility]  ? ? ? ? ? ? ? ? ? ? ?

## 2021-05-08 NOTE — Progress Notes (Signed)
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   Patient started to have increasing bilateral lower extremities swelling about 3 months ago and was started of po Lasix 40 mg daily, swelling appears to be improving, patient however had episodes of feeling lightheadedness and "too much urination" and decided to cut down Lasix from 40 mg daily to 20 mg daily about 1 month ago. Condition remained stable since until about 1 week ago, patient started to notice swelling in his abdomen and legs, and decreased urine output.  Finally, for the past 3 days, there has been barely any urine came out. He said he feeling "bladder fullness" when having BM, but no urine came out. No abdominal pain, no back pain, no shortness of breath, no fever or chills. No Hx of CHF or stroke  2/24: Admitted to [facility] for acute on chronic renal failure, new onset a. Fib. Nephrology consulted; high dose Lasix trial initiated, large volume paracentesis of 8.3 L 2/26: PCCM consulted. Transfer to ICU for CRRT  2/27: tolerated CRRT 3/1 Heparin drip restarted 2/28 and patient has had recurrent hematuria and bleeding IV site. Heparin drip now on hold  3/6 - Renal biopsy obtained 3/7 - transition to iHD, tolerating well 3/8 -3/10 --> Awaiting transfer to CIR; continue to discuss goals of care with palliative team 3/11 - Foley issues - will attempt to flush today to ensure no blockage   Assessment and Plan:  Acute Kidney Failure with Anuria requiring RRT - now transitioning to HD Anasarca, resolving - Nephro following - continues on HD - Patient with acute kidney injury of unclear origin. Differential includes ATN versus nephrotic  syndrome versus acute GN given hematuria. 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   - CHADVASC score of 4 (age, HTN, DM), initially holding anticoagulation in setting of gross hematuria.  - 3/7 Heparin drip - discontinued - 3/9 transition to Eliquis 2.5 twice daily given age and GFR   Acute anemia, acute blood loss anemia unlikely chronic anemia of chronic disease.  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 - Continue home ophthalmic meds   Hyponatremia, improving - Likely secondary to volume overload - Continue on dialysis with nephrology  Thrombocytopenia.  - Resolved   Type 2 DM with Hypoglycemia.  - Hypoglycemia resolved.  - CBGs within goal  Hyperkalemia - Resolved with HD  Metabolic acidosis  -Resolved  Gout, history of -Patient complaining of diffuse joint pain today, uric acid low; low risk for acute gout flare   Please note:  Medication Issues - Preferred narcotic agents for pain control are hydromorphone, fentanyl, and methadone. Morphine should not be used.  - Baclofen should be avoided - Avoid oral sodium phosphate and magnesium citrate based laxatives / bowel preps   DVT  prophylaxis: Eliquis Code Status: DNR Family Communication: Wife at bedside Disposition Plan: CIR - pending bed availability and insurance approval Status is: Inpatient Remains inpatient appropriate because: IV treatment, getting HD, CIR pending  Consultants:   Pccm, nephrology , IR  Subjective: No acute issues or events overnight, lengthy discussion at bedside today with patient and wife about ongoing stiffness in joints, likely secondary to prolonged bedbound status and weakness. Discussed need for ongoing mobility even if in bed including range of motion exercises given his left grip and elbow range of motion appear to be limited secondary to pain.  Objective: Vitals:   05/07/21 2006 05/07/21 2327 05/08/21 0415 05/08/21 0500  BP: (!) 82/59 98/66 106/72   Pulse: 93 90 91   Resp: '18 18 18   '$ Temp: 97.8 F (36.6 C) 98 F (36.7 C) 97.8 F (36.6 C)   TempSrc: Oral Oral Oral   SpO2: 99% 100% 100%   Weight:    111 kg  Height:        Intake/Output Summary (Last 24 hours) at 05/08/2021 0743 Last data filed at 05/08/2021 5176 Gross per 24 hour  Intake 507 ml  Output 60 ml  Net 447 ml    Filed Weights   05/06/21 1335 05/07/21 0341 05/08/21 0500  Weight: 111 kg 111.1 kg 111 kg    Examination: Calm, NAD Clear to auscultate without wheezes or rales Reg s1/s2 no gallop Soft benign +bs mildly distended +1 edema b/l x4 Mood and affect appropriate in current setting   Data Reviewed: I have personally reviewed following labs and imaging studies  CBC: Recent Labs  Lab 05/02/21 1028 05/03/21 0558 05/04/21 0144 05/06/21 0203 05/08/21 0620  WBC 6.7 8.1 6.1 7.4 7.4  NEUTROABS  --   --   --  5.6  --   HGB 9.2* 9.4* 9.5* 9.3* 8.9*  HCT 27.8* 28.3* 28.0* 27.3* 26.1*  MCV 95.2 95.6 94.6 95.1 94.9  PLT 155 169 164 156 160    Basic Metabolic Panel: Recent Labs  Lab 05/04/21 0144 05/04/21 1614 05/05/21 0134 05/05/21 1524 05/06/21 0203 05/06/21 1547 05/07/21 0411 05/07/21 1553 05/08/21 0103  NA 129*   < > 130*   < > 132* 135 132* 132* 132*  K 4.0   < > 3.7   < > 4.2 3.7 3.8 3.9 3.9  CL 95*   < > 96*   < > 98 98 95* 97* 97*  CO2 26   < > 26   < > '25 28 27 23 24  '$ GLUCOSE 153*   < > 115*   < > 129* 104* 108* 132* 130*  BUN 37*    < > 27*   < > 34* 19 24* 30* 33*  CREATININE 5.75*   < > 4.61*   < > 5.53* 4.00* 4.47* 4.96* 5.29*  CALCIUM 8.2*   < > 8.2*   < > 8.4* 8.5* 8.5* 8.4* 8.6*  MG 2.2  --  2.1  --  2.1  --  2.1  --  2.0  PHOS 2.4*   < > 2.0*   < > 2.0* 1.7* 2.1* 2.0* 2.1*   < > = values in this interval not displayed.    GFR: Estimated Creatinine Clearance: 15.6 mL/min (A) (by C-G formula based on SCr of 5.29 mg/dL (H)). Liver Function Tests: Recent Labs  Lab 05/06/21 0203 05/06/21 1547 05/07/21 0411 05/07/21 1553 05/08/21 0103  ALBUMIN 2.4* 2.6* 2.5* 2.4* 2.3*    No results  for input(s): LIPASE, AMYLASE in the last 168 hours. No results for input(s): AMMONIA in the last 168 hours. Coagulation Profile: No results for input(s): INR, PROTIME in the last 168 hours.  Cardiac Enzymes: No results for input(s): CKTOTAL, CKMB, CKMBINDEX, TROPONINI in the last 168 hours.  BNP (last 3 results) No results for input(s): PROBNP in the last 8760 hours. HbA1C: No results for input(s): HGBA1C in the last 72 hours. CBG: Recent Labs  Lab 05/05/21 1121 05/05/21 1609 05/05/21 2128 05/06/21 0602 05/06/21 1428  GLUCAP 114* 126* 143* 109* 92    Lipid Profile: No results for input(s): CHOL, HDL, LDLCALC, TRIG, CHOLHDL, LDLDIRECT in the last 72 hours. Thyroid Function Tests: No results for input(s): TSH, T4TOTAL, FREET4, T3FREE, THYROIDAB in the last 72 hours. Anemia Panel: No results for input(s): VITAMINB12, FOLATE, FERRITIN, TIBC, IRON, RETICCTPCT in the last 72 hours. Sepsis Labs: No results for input(s): PROCALCITON, LATICACIDVEN in the last 168 hours.   No results found for this or any previous visit (from the past 240 hour(s)).   Radiology Studies: No results found.      Scheduled Meds:  apixaban  2.5 mg Oral BID   aspirin EC  81 mg Oral Daily   atropine  1 drop Right Eye QHS   brimonidine  1 drop Left Eye TID   Chlorhexidine Gluconate Cloth  6 each Topical Q0600   Chlorhexidine  Gluconate Cloth  6 each Topical Q0600   dorzolamide-timolol  1 drop Left Eye BID   doxazosin  8 mg Oral Daily   dutasteride  0.5 mg Oral Q M,W,F   feeding supplement  237 mL Oral TID BM   latanoprost  1 drop Left Eye QHS   mouth rinse  15 mL Mouth Rinse BID   multivitamin with minerals  1 tablet Oral Daily   pantoprazole  40 mg Oral Daily   pilocarpine  1 drop Left Eye TID   polyethylene glycol  17 g Oral BID   prednisoLONE acetate  1 drop Right Eye QHS   senna-docusate  1 tablet Oral BID   sodium chloride flush  10-40 mL Intracatheter Q12H   triamcinolone ointment  1 application. Topical Daily   Continuous Infusions:  sodium chloride Stopped (04/28/21 1032)   sodium chloride irrigation      Assessment & Plan:   Principal Problem:   AKI (acute kidney injury) ([place]) Active Problems:   Pressure injury of skin   Protein-calorie malnutrition, severe    LOS: 15 days   Time spent: 4mn  Emmajane Altamura C Savana Spina, DO [facility]  If 7PM-7AM, please contact night-coverage 05/08/2021, 7:43 AM

## 2021-05-08 NOTE — Progress Notes (Signed)
Foley Cath irrigated per MD order without difficulty.  Will continue to monitor.  ?

## 2021-05-08 NOTE — Progress Notes (Signed)
removed 3059ms net fluid no complaints no complications tolerated tx well.  pre bp 100/59  post bp 102/63 pre weight 111.8kg post weight 108.5kg bed scales.  cath ran well packed with heparin clamped and capped.   ?

## 2021-05-08 NOTE — Progress Notes (Signed)
[facility] ROUNDING NOTE   Subjective:   Interval History: 81 year old gentleman hypertension diabetes prostate cancer with surveillance.  Patient was admitted with increasing bilateral lower extremity swelling.  Hospital course complicated by new onset atrial fibrillation.  Large-volume paracentesis 8.3 L.  Was transferred to the ICU 04/25/2021 for CRRT-04/28/2021.  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   .  Last dialysis 05/08/2021.  Renal biopsy performed 05/03/2021.  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  Recovery not expected  Blood pressure 99/58 pulse 90 temperature 98.2 O2 sats 100% room air  Urine output minimal  Sodium 132 potassium 3.9 chloride 97 CO2 24 BUN 33 creatinine 5.29 glucose 130 calcium 8.6 phosphorus 2.1 magnesium 2.0 hemoglobin 8.9   Objective:  Vital signs in last 24 hours:  Temp:  [97.8 F (36.6 C)-98.2 F (36.8 C)] 98.2 F (36.8 C) (03/11 0803) Pulse Rate:  [84-98] 84 (03/11 0930) Resp:  [10-20] 13 (03/11 0930) BP: (82-106)/(52-75) 99/58 (03/11 0930) SpO2:  [99 %-100 %] 100 % (03/11 0930) Weight:  [111 kg-111.8 kg] 111.8 kg (03/11 0803)  Weight change: -4 kg Filed Weights   05/07/21 0341 05/08/21 0500 05/08/21 0803  Weight: 111.1 kg 111 kg 111.8 kg    Intake/Output: I/O last 3 completed shifts: In: 507 [P.O.:507] Out: 60 [Urine:60]   Intake/Output this shift:  No intake/output data recorded.  Blind  CVS- RRR no murmurs rubs gallops RS- CTA no wheezes or rales ABD- BS present soft non-distended EXT-improved lower extremity edema   Basic Metabolic Panel: Recent Labs  Lab 05/04/21 0144 05/04/21 1614 05/05/21 0134 05/05/21 1524 05/06/21 0203 05/06/21 1547 05/07/21 0411 05/07/21 1553 05/08/21 0103  NA 129*   < > 130*   < > 132* 135 132* 132* 132*  K 4.0   < > 3.7   < > 4.2 3.7 3.8 3.9 3.9  CL  95*   < > 96*   < > 98 98 95* 97* 97*  CO2 26   < > 26   < > '25 28 27 23 24  '$ GLUCOSE 153*   < > 115*   < > 129* 104* 108* 132* 130*  BUN 37*   < > 27*   < > 34* 19 24* 30* 33*  CREATININE 5.75*   < > 4.61*   < > 5.53* 4.00* 4.47* 4.96* 5.29*  CALCIUM 8.2*   < > 8.2*   < > 8.4* 8.5* 8.5* 8.4* 8.6*  MG 2.2  --  2.1  --  2.1  --  2.1  --  2.0  PHOS 2.4*   < > 2.0*   < > 2.0* 1.7* 2.1* 2.0* 2.1*   < > = values in this interval not displayed.     Liver Function Tests: Recent Labs  Lab 05/06/21 0203 05/06/21 1547 05/07/21 0411 05/07/21 1553 05/08/21 0103  ALBUMIN 2.4* 2.6* 2.5* 2.4* 2.3*    No results for input(s): LIPASE, AMYLASE in the last 168 hours. No results for input(s): AMMONIA in the last 168 hours.  CBC: Recent Labs  Lab 05/02/21 1028 05/03/21 0558 05/04/21 0144 05/06/21 0203 05/08/21 0620  WBC 6.7 8.1 6.1 7.4 7.4  NEUTROABS  --   --   --  5.6  --   HGB 9.2* 9.4* 9.5* 9.3* 8.9*  HCT 27.8* 28.3* 28.0* 27.3* 26.1*  MCV 95.2 95.6 94.6 95.1 94.9  PLT 155 169 164 156 183  Cardiac Enzymes: No results for input(s): CKTOTAL, CKMB, CKMBINDEX, TROPONINI in the last 168 hours.   BNP: Invalid input(s): POCBNP  CBG: Recent Labs  Lab 05/05/21 1121 05/05/21 1609 05/05/21 2128 05/06/21 0602 05/06/21 1428  GLUCAP 114* 126* 143* 109* 92     Microbiology: Results for orders placed or performed during the hospital encounter of 04/23/21  Resp Panel by RT-PCR (Flu A&B, Covid) Nasopharyngeal Swab     Status: None   Collection Time: 04/23/21 12:23 PM   Specimen: Nasopharyngeal Swab; Nasopharyngeal(NP) swabs in vial transport medium  Result Value Ref Range Status   SARS Coronavirus 2 by RT PCR NEGATIVE NEGATIVE Final    Comment: (NOTE) SARS-CoV-2 target nucleic acids are NOT DETECTED.  The SARS-CoV-2 RNA is generally detectable in upper respiratory specimens during the acute phase of infection. The lowest concentration of SARS-CoV-2 viral copies this assay can  detect is 138 copies/mL. A negative result does not preclude SARS-Cov-2 infection and should not be used as the sole basis for treatment or other patient management decisions. A negative result may occur with  improper specimen collection/handling, submission of specimen other than nasopharyngeal swab, presence of viral mutation(s) within the areas targeted by this assay, and inadequate number of viral copies(<138 copies/mL). A negative result must be combined with clinical observations, patient history, and epidemiological information. The expected result is Negative.  Fact Sheet for Patients:  EntrepreneurPulse.com.au  Fact Sheet for Healthcare Providers:  IncredibleEmployment.be  This test is no t yet approved or cleared by the [place] FDA and  has been authorized for detection and/or diagnosis of SARS-CoV-2 by FDA under an Emergency Use Authorization (EUA). This EUA will remain  in effect (meaning this test can be used) for the duration of the COVID-19 declaration under Section 564(b)(1) of the Act, 21 U.S.C.section 360bbb-3(b)(1), unless the authorization is terminated  or revoked sooner.       Influenza A by PCR NEGATIVE NEGATIVE Final   Influenza B by PCR NEGATIVE NEGATIVE Final    Comment: (NOTE) The Xpert Xpress SARS-CoV-2/FLU/RSV plus assay is intended as an aid in the diagnosis of influenza from Nasopharyngeal swab specimens and should not be used as a sole basis for treatment. Nasal washings and aspirates are unacceptable for Xpert Xpress SARS-CoV-2/FLU/RSV testing.  Fact Sheet for Patients: EntrepreneurPulse.com.au  Fact Sheet for Healthcare Providers: IncredibleEmployment.be  This test is not yet approved or cleared by the [place] FDA and has been authorized for detection and/or diagnosis of SARS-CoV-2 by FDA under an Emergency Use Authorization (EUA). This EUA will remain in  effect (meaning this test can be used) for the duration of the COVID-19 declaration under Section 564(b)(1) of the Act, 21 U.S.C. section 360bbb-3(b)(1), unless the authorization is terminated or revoked.  Performed at [facility], [place] [address] [place], [place] [address]   MRSA Next Gen by PCR, Nasal     Status: None   Collection Time: 04/25/21 11:41 AM   Specimen: Nasal Mucosa; Nasal Swab  Result Value Ref Range Status   MRSA by PCR Next Gen NOT DETECTED NOT DETECTED Final    Comment: (NOTE) The GeneXpert MRSA Assay (FDA approved for NASAL specimens only), is one component of a comprehensive MRSA colonization surveillance program. It is not intended to diagnose MRSA infection nor to guide or monitor treatment for MRSA infections. Test performance is not FDA approved in patients less than 54 years old. Performed at [facility], [place] [address] [place], [place] [address]  Coagulation Studies: No results for input(s): LABPROT, INR in the last 72 hours.  Urinalysis: No results for input(s): COLORURINE, LABSPEC, PHURINE, GLUCOSEU, HGBUR, BILIRUBINUR, KETONESUR, PROTEINUR, UROBILINOGEN, NITRITE, LEUKOCYTESUR in the last 72 hours.  Invalid input(s): APPERANCEUR    Imaging: No results found.   Medications:    sodium chloride Stopped (04/28/21 1032)   sodium chloride     sodium chloride     sodium chloride     sodium chloride     sodium chloride irrigation      apixaban  2.5 mg Oral BID   aspirin EC  81 mg Oral Daily   atropine  1 drop Right Eye QHS   brimonidine  1 drop Left Eye TID   Chlorhexidine Gluconate Cloth  6 each Topical Q0600   Chlorhexidine Gluconate Cloth  6 each Topical Q0600   dorzolamide-timolol  1 drop Left Eye BID   doxazosin  8 mg Oral Daily   dutasteride  0.5 mg Oral Q M,W,F   feeding supplement  237 mL Oral TID BM   latanoprost  1 drop Left Eye QHS   mouth rinse  15 mL Mouth Rinse BID   multivitamin with minerals  1 tablet  Oral Daily   pantoprazole  40 mg Oral Daily   pilocarpine  1 drop Left Eye TID   polyethylene glycol  17 g Oral BID   prednisoLONE acetate  1 drop Right Eye QHS   senna-docusate  1 tablet Oral BID   sodium chloride flush  10-40 mL Intracatheter Q12H   triamcinolone ointment  1 application. Topical Daily   sodium chloride, sodium chloride, sodium chloride, sodium chloride, sodium chloride, acetaminophen, alteplase, bisacodyl, heparin, HYDROcodone-acetaminophen, lidocaine (PF), lidocaine-prilocaine, metoprolol tartrate, oxyCODONE, pentafluoroprop-tetrafluoroeth, simethicone, sodium chloride flush  Assessment/ Plan:  Dialysis dependent acute kidney injury of uncertain etiology.  Unresponsive to IV diuretics has received CRRT 04/25/2021-04/28/2021.  Seen on dialysis 05/08/2021   we will need to consult vein and vascular surgery for vascular access after the weekend. Acute kidney injury uncertain etiology.  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 Dialysis access.  Tunneled dialysis catheter. Hyperkalemia resolved Anemia mild Metabolic acidosis resolved Diabetic mellitus as per primary service Atrial fibrillation as per primary service Hypophosphatemia we will add sodium phosphate x24   LOS: [place] '@TODAY''@10'$ :14 AM

## 2021-05-09 DIAGNOSIS — N179 Acute kidney failure, unspecified: Secondary | ICD-10-CM | POA: Diagnosis not present

## 2021-05-09 LAB — RENAL FUNCTION PANEL
Albumin: 2.4 g/dL — ABNORMAL LOW (ref 3.5–5.0)
Albumin: 2.4 g/dL — ABNORMAL LOW (ref 3.5–5.0)
Anion gap: 8 (ref 5–15)
Anion gap: 10 (ref 5–15)
BUN: 22 mg/dL (ref 8–23)
BUN: 28 mg/dL — ABNORMAL HIGH (ref 8–23)
CO2: 29 mmol/L (ref 22–32)
CO2: 26 mmol/L (ref 22–32)
Calcium: 8.3 mg/dL — ABNORMAL LOW (ref 8.9–10.3)
Calcium: 8.3 mg/dL — ABNORMAL LOW (ref 8.9–10.3)
Chloride: 97 mmol/L — ABNORMAL LOW (ref 98–111)
Chloride: 97 mmol/L — ABNORMAL LOW (ref 98–111)
Creatinine, Ser: 4.23 mg/dL — ABNORMAL HIGH (ref 0.61–1.24)
Creatinine, Ser: 4.85 mg/dL — ABNORMAL HIGH (ref 0.61–1.24)
GFR, Estimated: 13 mL/min — ABNORMAL LOW (ref >60)
GFR, Estimated: 11 mL/min — ABNORMAL LOW (ref >60)
Glucose, Bld: 106 mg/dL — ABNORMAL HIGH (ref 70–99)
Glucose, Bld: 130 mg/dL — ABNORMAL HIGH (ref 70–99)
Phosphorus: 2.2 mg/dL — ABNORMAL LOW (ref 2.5–4.6)
Phosphorus: 2.5 mg/dL (ref 2.5–4.6)
Potassium: 3.6 mmol/L (ref 3.5–5.1)
Potassium: 3.6 mmol/L (ref 3.5–5.1)
Sodium: 134 mmol/L — ABNORMAL LOW (ref 135–145)
Sodium: 133 mmol/L — ABNORMAL LOW (ref 135–145)

## 2021-05-09 LAB — MAGNESIUM: Magnesium: 2.1 mg/dL (ref 1.7–2.4)

## 2021-05-09 LAB — GLUCOSE, CAPILLARY: Glucose-Capillary: 121 mg/dL — ABNORMAL HIGH (ref 70–99)

## 2021-05-09 MED ORDER — K PHOS MONO-SOD PHOS DI & MONO 155-852-130 MG PO TABS
500.0000 mg | ORAL_TABLET | Freq: Two times a day (BID) | ORAL | Status: AC
  Administered 2021-05-09 (×2): 500 mg via ORAL
  Filled 2021-05-09 (×2): qty 2

## 2021-05-09 NOTE — Progress Notes (Signed)
[facility] ROUNDING NOTE   Subjective:   Interval History: 81 year old gentleman hypertension diabetes prostate cancer with surveillance.  Patient was admitted with increasing bilateral lower extremity swelling.  Hospital course complicated by new onset atrial fibrillation.  Large-volume paracentesis 8.3 L.  Was transferred to the ICU 04/25/2021 for CRRT-04/28/2021.  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   .  Last dialysis 05/08/2021 with 3 L removed.  Renal biopsy performed 05/03/2021.  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  Recovery not expected.  Continues on a Tuesday Thursday Saturday schedule.  We will need to clip and vein and vascular surgery consultation Monday.  Blood pressure 121/74 pulse 100 temperature 97.7 O2 sats 96% room air  Urine output minimal  Sodium 134 potassium 3.6 chloride 97 CO2 29 BUN 22 creatinine 4.2 glucose 106 calcium 8.3 phosphorus 2.2 magnesium 2.4.  Hemoglobin 8.9   Objective:  Vital signs in last 24 hours:  Temp:  [97.7 F (36.5 C)-98.7 F (37.1 C)] 97.7 F (36.5 C) (03/12 0806) Pulse Rate:  [81-101] 95 (03/12 0806) Resp:  [9-20] 17 (03/12 0806) BP: (102-121)/(56-82) 121/74 (03/12 0806) SpO2:  [97 %-100 %] 100 % (03/12 0806) Weight:  [105.4 kg-108.1 kg] 105.4 kg (03/12 0340)  Weight change: 0.8 kg Filed Weights   05/08/21 0803 05/08/21 1141 05/09/21 0340  Weight: 111.8 kg 108.1 kg 105.4 kg    Intake/Output: I/O last 3 completed shifts: In: 103 [P.O.:817; Other:50] Out: 1594 [Urine:50; Other:3000]   Intake/Output this shift:  No intake/output data recorded.  Blind  CVS- RRR no murmurs rubs gallops RS- CTA no wheezes or rales ABD- BS present soft non-distended EXT-improved lower extremity edema   Basic Metabolic Panel: Recent Labs  Lab 05/05/21 0134 05/05/21 1524 05/06/21 0203 05/06/21 1547  05/07/21 0411 05/07/21 1553 05/08/21 0103 05/08/21 1554 05/09/21 0321  NA 130*   < > 132*   < > 132* 132* 132* 132* 134*  K 3.7   < > 4.2   < > 3.8 3.9 3.9 3.4* 3.6  CL 96*   < > 98   < > 95* 97* 97* 96* 97*  CO2 26   < > 25   < > '27 23 24 25 29  '$ GLUCOSE 115*   < > 129*   < > 108* 132* 130* 116* 106*  BUN 27*   < > 34*   < > 24* 30* 33* 19 22  CREATININE 4.61*   < > 5.53*   < > 4.47* 4.96* 5.29* 3.67* 4.23*  CALCIUM 8.2*   < > 8.4*   < > 8.5* 8.4* 8.6* 8.5* 8.3*  MG 2.1  --  2.1  --  2.1  --  2.0  --  2.1  PHOS 2.0*   < > 2.0*   < > 2.1* 2.0* 2.1* 1.8* 2.2*   < > = values in this interval not displayed.     Liver Function Tests: Recent Labs  Lab 05/07/21 0411 05/07/21 1553 05/08/21 0103 05/08/21 1554 05/09/21 0321  ALBUMIN 2.5* 2.4* 2.3* 2.4* 2.4*    No results for input(s): LIPASE, AMYLASE in the last 168 hours. No results for input(s): AMMONIA in the last 168 hours.  CBC: Recent Labs  Lab 05/02/21 1028 05/03/21 0558 05/04/21 0144 05/06/21 0203 05/08/21 0620  WBC 6.7 8.1 6.1 7.4 7.4  NEUTROABS  --   --   --  5.6  --   HGB  9.2* 9.4* 9.5* 9.3* 8.9*  HCT 27.8* 28.3* 28.0* 27.3* 26.1*  MCV 95.2 95.6 94.6 95.1 94.9  PLT 155 169 164 156 183     Cardiac Enzymes: No results for input(s): CKTOTAL, CKMB, CKMBINDEX, TROPONINI in the last 168 hours.   BNP: Invalid input(s): POCBNP  CBG: Recent Labs  Lab 05/05/21 1121 05/05/21 1609 05/05/21 2128 05/06/21 0602 05/06/21 1428  GLUCAP 114* 126* 143* 109* 92     Microbiology: Results for orders placed or performed during the hospital encounter of 04/23/21  Resp Panel by RT-PCR (Flu A&B, Covid) Nasopharyngeal Swab     Status: None   Collection Time: 04/23/21 12:23 PM   Specimen: Nasopharyngeal Swab; Nasopharyngeal(NP) swabs in vial transport medium  Result Value Ref Range Status   SARS Coronavirus 2 by RT PCR NEGATIVE NEGATIVE Final    Comment: (NOTE) SARS-CoV-2 target nucleic acids are NOT DETECTED.  The  SARS-CoV-2 RNA is generally detectable in upper respiratory specimens during the acute phase of infection. The lowest concentration of SARS-CoV-2 viral copies this assay can detect is 138 copies/mL. A negative result does not preclude SARS-Cov-2 infection and should not be used as the sole basis for treatment or other patient management decisions. A negative result may occur with  improper specimen collection/handling, submission of specimen other than nasopharyngeal swab, presence of viral mutation(s) within the areas targeted by this assay, and inadequate number of viral copies(<138 copies/mL). A negative result must be combined with clinical observations, patient history, and epidemiological information. The expected result is Negative.  Fact Sheet for Patients:  EntrepreneurPulse.com.au  Fact Sheet for Healthcare Providers:  IncredibleEmployment.be  This test is no t yet approved or cleared by the [place] FDA and  has been authorized for detection and/or diagnosis of SARS-CoV-2 by FDA under an Emergency Use Authorization (EUA). This EUA will remain  in effect (meaning this test can be used) for the duration of the COVID-19 declaration under Section 564(b)(1) of the Act, 21 U.S.C.section 360bbb-3(b)(1), unless the authorization is terminated  or revoked sooner.       Influenza A by PCR NEGATIVE NEGATIVE Final   Influenza B by PCR NEGATIVE NEGATIVE Final    Comment: (NOTE) The Xpert Xpress SARS-CoV-2/FLU/RSV plus assay is intended as an aid in the diagnosis of influenza from Nasopharyngeal swab specimens and should not be used as a sole basis for treatment. Nasal washings and aspirates are unacceptable for Xpert Xpress SARS-CoV-2/FLU/RSV testing.  Fact Sheet for Patients: EntrepreneurPulse.com.au  Fact Sheet for Healthcare Providers: IncredibleEmployment.be  This test is not yet approved or  cleared by the [place] FDA and has been authorized for detection and/or diagnosis of SARS-CoV-2 by FDA under an Emergency Use Authorization (EUA). This EUA will remain in effect (meaning this test can be used) for the duration of the COVID-19 declaration under Section 564(b)(1) of the Act, 21 U.S.C. section 360bbb-3(b)(1), unless the authorization is terminated or revoked.  Performed at [facility], [place] [address], [place], [place] [address]   MRSA Next Gen by PCR, Nasal     Status: None   Collection Time: 04/25/21 11:41 AM   Specimen: Nasal Mucosa; Nasal Swab  Result Value Ref Range Status   MRSA by PCR Next Gen NOT DETECTED NOT DETECTED Final    Comment: (NOTE) The GeneXpert MRSA Assay (FDA approved for NASAL specimens only), is one component of a comprehensive MRSA colonization surveillance program. It is not intended to diagnose MRSA infection nor to guide or monitor  treatment for MRSA infections. Test performance is not FDA approved in patients less than 34 years old. Performed at [facility], [place] [address] [place], [place] [address]     Coagulation Studies: No results for input(s): LABPROT, INR in the last 72 hours.  Urinalysis: No results for input(s): COLORURINE, LABSPEC, PHURINE, GLUCOSEU, HGBUR, BILIRUBINUR, KETONESUR, PROTEINUR, UROBILINOGEN, NITRITE, LEUKOCYTESUR in the last 72 hours.  Invalid input(s): APPERANCEUR    Imaging: No results found.   Medications:    sodium chloride Stopped (04/28/21 1032)   sodium chloride irrigation Stopped (05/08/21 1814)    apixaban  2.5 mg Oral BID   aspirin EC  81 mg Oral Daily   atropine  1 drop Right Eye QHS   brimonidine  1 drop Left Eye TID   Chlorhexidine Gluconate Cloth  6 each Topical Q0600   Chlorhexidine Gluconate Cloth  6 each Topical Q0600   dorzolamide-timolol  1 drop Left Eye BID   doxazosin  8 mg Oral Daily   dutasteride  0.5 mg Oral Q M,W,F   feeding supplement  237 mL Oral TID BM    latanoprost  1 drop Left Eye QHS   mouth rinse  15 mL Mouth Rinse BID   multivitamin with minerals  1 tablet Oral Daily   pantoprazole  40 mg Oral Daily   pilocarpine  1 drop Left Eye TID   polyethylene glycol  17 g Oral BID   prednisoLONE acetate  1 drop Right Eye QHS   senna-docusate  1 tablet Oral BID   sodium chloride flush  10-40 mL Intracatheter Q12H   triamcinolone ointment  1 application. Topical Daily   sodium chloride, acetaminophen, bisacodyl, HYDROcodone-acetaminophen, metoprolol tartrate, oxyCODONE, simethicone, sodium chloride flush  Assessment/ Plan:  Dialysis dependent acute kidney injury of uncertain etiology.  Unresponsive to IV diuretics has received CRRT 04/25/2021-04/28/2021.  Seen on dialysis 05/08/2021   we will need to consult vein and vascular surgery for vascular access after the weekend. Acute kidney injury uncertain etiology.  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 Dialysis access.  Tunneled dialysis catheter. Hyperkalemia resolved Anemia mild Metabolic acidosis resolved Diabetic mellitus as per primary service Atrial fibrillation as per primary service Hypophosphatemia we will add another sodium phosphate x24 hours   LOS: [place] '@TODAY''@9'$ :30 AM

## 2021-05-09 NOTE — Progress Notes (Signed)
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   Patient started to have increasing bilateral lower extremities swelling about 3 months ago and was started of po Lasix 40 mg daily, swelling appears to be improving, patient however had episodes of feeling lightheadedness and "too much urination" and decided to cut down Lasix from 40 mg daily to 20 mg daily about 1 month ago. Condition remained stable since until about 1 week ago, patient started to notice swelling in his abdomen and legs, and decreased urine output.  Finally, for the past 3 days, there has been barely any urine came out. He said he feeling "bladder fullness" when having BM, but no urine came out. No abdominal pain, no back pain, no shortness of breath, no fever or chills. No Hx of CHF or stroke  2/24: Admitted to [facility] for acute on chronic renal failure, new onset a. Fib. Nephrology consulted; high dose Lasix trial initiated, large volume paracentesis of 8.3 L 2/26: PCCM consulted. Transfer to ICU for CRRT  2/27: tolerated CRRT 3/1 Heparin drip restarted 2/28 and patient has had recurrent hematuria and bleeding IV site. Heparin drip now on hold  3/6 - Renal biopsy obtained 3/7 - transition to iHD, tolerating well 3/8 -3/10 --> Awaiting transfer to CIR; continue to discuss goals of care with palliative team 3/11 - Foley draining issues - flush as indicated; discuss with nephrology potential for discontinuing.  Assessment and Plan:  Acute Kidney Failure with Anuria requiring RRT - now transitioning to HD Anasarca, resolving - Nephro following - continues on HD - Patient with acute kidney injury of unclear origin. Differential  includes ATN versus nephrotic syndrome versus acute GN given hematuria. 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   - CHADVASC score of 4 (age, HTN, DM), initially holding anticoagulation in setting of gross hematuria.  - 3/7 Heparin drip - discontinued - 3/9 transition to Eliquis 2.5 twice daily given age and GFR   Acute anemia, acute blood loss anemia unlikely chronic anemia of chronic disease.  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 - Continue home ophthalmic meds   Hyponatremia, improving - Likely secondary to volume overload - Continue on dialysis with nephrology  Thrombocytopenia.  - Resolved   Type 2 DM with Hypoglycemia.  - Hypoglycemia resolved.  - CBGs within goal  Hyperkalemia - Resolved with HD  Metabolic acidosis  -Resolved  Gout, history of -Patient complaining of diffuse joint pain today, uric acid low; low risk for acute gout flare   Please note:  Medication Issues - Preferred narcotic agents for pain control are hydromorphone, fentanyl, and methadone. Morphine should not be used.  - Baclofen should be avoided - Avoid oral sodium phosphate and magnesium citrate based laxatives / bowel preps   DVT  prophylaxis: Eliquis Code Status: DNR Family Communication: Wife at bedside Disposition Plan: CIR - pending bed availability and insurance approval Status is: Inpatient Remains inpatient appropriate because: IV treatment, getting  HD, CIR pending  Consultants:  Pccm, nephrology , IR  Subjective: No acute issues or events overnight -patient continues to complain of generalized joint pain and stiffness worse with the left PIP joints in his hand  Objective: Vitals:   05/08/21 2044 05/08/21 2323 05/09/21 0331 05/09/21 0340  BP: 109/69 104/80 106/77   Pulse: 94 92 89   Resp: '17 18 18   '$ Temp: 97.8 F (36.6 C) 97.9 F (36.6 C) 98 F (36.7 C)   TempSrc: Oral Oral Oral   SpO2: 98% 97% 100%   Weight:    105.4 kg  Height:        Intake/Output Summary (Last 24 hours) at 05/09/2021 0737 Last data filed at 05/09/2021 0334 Gross per 24 hour  Intake 530 ml  Output 3030 ml  Net -2500 ml    Filed Weights   05/08/21 0803 05/08/21 1141 05/09/21 0340  Weight: 111.8 kg 108.1 kg 105.4 kg    Examination: Calm, NAD Clear to auscultate without wheezes or rales Reg s1/s2 no gallop Soft benign +bs mildly distended +1 edema b/l x4 Mood and affect appropriate in current setting   Data Reviewed: I have personally reviewed following labs and imaging studies  CBC: Recent Labs  Lab 05/02/21 1028 05/03/21 0558 05/04/21 0144 05/06/21 0203 05/08/21 0620  WBC 6.7 8.1 6.1 7.4 7.4  NEUTROABS  --   --   --  5.6  --   HGB 9.2* 9.4* 9.5* 9.3* 8.9*  HCT 27.8* 28.3* 28.0* 27.3* 26.1*  MCV 95.2 95.6 94.6 95.1 94.9  PLT 155 169 164 156 539    Basic Metabolic Panel: Recent Labs  Lab 05/05/21 0134 05/05/21 1524 05/06/21 0203 05/06/21 1547 05/07/21 0411 05/07/21 1553 05/08/21 0103 05/08/21 1554 05/09/21 0321  NA 130*   < > 132*   < > 132* 132* 132* 132* 134*  K 3.7   < > 4.2   < > 3.8 3.9 3.9 3.4* 3.6  CL 96*   < > 98   < > 95* 97* 97* 96* 97*  CO2 26   < > 25   < > '27 23 24 25 29  '$ GLUCOSE 115*   < > 129*   < > 108* 132* 130* 116* 106*  BUN 27*   < > 34*   < > 24* 30* 33* 19 22  CREATININE 4.61*   < > 5.53*   < > 4.47* 4.96* 5.29* 3.67* 4.23*  CALCIUM 8.2*   < > 8.4*   < > 8.5* 8.4* 8.6* 8.5* 8.3*  MG 2.1  --   2.1  --  2.1  --  2.0  --  2.1  PHOS 2.0*   < > 2.0*   < > 2.1* 2.0* 2.1* 1.8* 2.2*   < > = values in this interval not displayed.    GFR: Estimated Creatinine Clearance: 18 mL/min (A) (by C-G formula based on SCr of 4.23 mg/dL (H)). Liver Function Tests: Recent Labs  Lab 05/07/21 0411 05/07/21 1553 05/08/21 0103 05/08/21 1554 05/09/21 0321  ALBUMIN 2.5* 2.4* 2.3* 2.4* 2.4*    No results for input(s): LIPASE, AMYLASE in the last 168 hours. No results for input(s): AMMONIA in the last 168 hours. Coagulation Profile: No results for input(s): INR, PROTIME in the last 168 hours.  Cardiac  Enzymes: No results for input(s): CKTOTAL, CKMB, CKMBINDEX, TROPONINI in the last 168 hours.  BNP (last 3 results) No results for input(s): PROBNP in the last 8760 hours. HbA1C: No results for input(s): HGBA1C in the last 72 hours. CBG: Recent Labs  Lab 05/05/21 1121 05/05/21 1609 05/05/21 2128 05/06/21 0602 05/06/21 1428  GLUCAP 114* 126* 143* 109* 92    Lipid Profile: No results for input(s): CHOL, HDL, LDLCALC, TRIG, CHOLHDL, LDLDIRECT in the last 72 hours. Thyroid Function Tests: No results for input(s): TSH, T4TOTAL, FREET4, T3FREE, THYROIDAB in the last 72 hours. Anemia Panel: No results for input(s): VITAMINB12, FOLATE, FERRITIN, TIBC, IRON, RETICCTPCT in the last 72 hours. Sepsis Labs: No results for input(s): PROCALCITON, LATICACIDVEN in the last 168 hours.   No results found for this or any previous visit (from the past 240 hour(s)).   Radiology Studies: No results found.      Scheduled Meds:  apixaban  2.5 mg Oral BID   aspirin EC  81 mg Oral Daily   atropine  1 drop Right Eye QHS   brimonidine  1 drop Left Eye TID   Chlorhexidine Gluconate Cloth  6 each Topical Q0600   Chlorhexidine Gluconate Cloth  6 each Topical Q0600   dorzolamide-timolol  1 drop Left Eye BID   doxazosin  8 mg Oral Daily   dutasteride  0.5 mg Oral Q M,W,F   feeding supplement  237 mL  Oral TID BM   latanoprost  1 drop Left Eye QHS   mouth rinse  15 mL Mouth Rinse BID   multivitamin with minerals  1 tablet Oral Daily   pantoprazole  40 mg Oral Daily   pilocarpine  1 drop Left Eye TID   polyethylene glycol  17 g Oral BID   prednisoLONE acetate  1 drop Right Eye QHS   senna-docusate  1 tablet Oral BID   sodium chloride flush  10-40 mL Intracatheter Q12H   triamcinolone ointment  1 application. Topical Daily   Continuous Infusions:  sodium chloride Stopped (04/28/21 1032)   sodium chloride irrigation Stopped (05/08/21 1814)    Assessment & Plan:   Principal Problem:   AKI (acute kidney injury) ([place]) Active Problems:   Pressure injury of skin   Protein-calorie malnutrition, severe    LOS: 16 days   Time spent: 62mn  Heydi Swango C Maejor Erven, DO [facility]  If 7PM-7AM, please contact night-coverage 05/09/2021, 7:37 AM

## 2021-05-10 ENCOUNTER — Encounter (HOSPITAL_COMMUNITY): Payer: Medicare PPO

## 2021-05-10 DIAGNOSIS — N179 Acute kidney failure, unspecified: Secondary | ICD-10-CM

## 2021-05-10 DIAGNOSIS — N185 Chronic kidney disease, stage 5: Secondary | ICD-10-CM

## 2021-05-10 LAB — RENAL FUNCTION PANEL
Albumin: 2.3 g/dL — ABNORMAL LOW (ref 3.5–5.0)
Anion gap: 7 (ref 5–15)
BUN: 31 mg/dL — ABNORMAL HIGH (ref 8–23)
CO2: 30 mmol/L (ref 22–32)
Calcium: 8.7 mg/dL — ABNORMAL LOW (ref 8.9–10.3)
Chloride: 97 mmol/L — ABNORMAL LOW (ref 98–111)
Creatinine, Ser: 5.24 mg/dL — ABNORMAL HIGH (ref 0.61–1.24)
GFR, Estimated: 10 mL/min — ABNORMAL LOW (ref >60)
Glucose, Bld: 111 mg/dL — ABNORMAL HIGH (ref 70–99)
Phosphorus: 2.9 mg/dL (ref 2.5–4.6)
Potassium: 3.8 mmol/L (ref 3.5–5.1)
Sodium: 134 mmol/L — ABNORMAL LOW (ref 135–145)

## 2021-05-10 LAB — MAGNESIUM: Magnesium: 2.2 mg/dL (ref 1.7–2.4)

## 2021-05-10 LAB — GLUCOSE, CAPILLARY: Glucose-Capillary: 108 mg/dL — ABNORMAL HIGH (ref 70–99)

## 2021-05-10 NOTE — Progress Notes (Signed)
Physical Therapy Treatment ?Patient Details ?Name: Austin Wheeler ?MRN: [id number] ?DOB: [birth year] ?Today's Date: 05/10/2021 ? ? ?History of Present Illness 81 y/o male who presents on 04/23/21 with SOB, weakness and swelling abdomen/LEs. Found to have new onset A-fib, AKI injury of unknown origin started on HD, s/p paracentesis 2/24. Renal biopsy planned fof 05/03/21. PMH includes DM, HTN, legally blind. ? ?  ?PT Comments  ? ? Pt making steady progress towards his physical therapy goals; remains motivated to participate. Has been having episodes of bowel incontinence; provided peri care and assisted to and from bedside commode. Session focused on bed mobility and transfer training. Utilized Denna Haggard with emphasis on glute activation; pt performed ~5 sit to stands from elevated surfaces. Progression next session will be trialing standing up with rolling walker. Suspect steady progress based on PLOF, family support, and motivation. Recommend AIR to address deficits, maximize functional mobility and decrease caregiver burden.  ?  ?Recommendations for follow up therapy are one component of a multi-disciplinary discharge planning process, led by the attending physician.  Recommendations may be updated based on patient status, additional functional criteria and insurance authorization. ? ?Follow Up Recommendations ? Acute inpatient rehab (3hours/day) ?  ?  ?Assistance Recommended at Discharge Frequent or constant Supervision/Assistance  ?Patient can return home with the following Two people to help with walking and/or transfers;Help with stairs or ramp for entrance;Assistance with cooking/housework;Two people to help with bathing/dressing/bathroom ?  ?Equipment Recommendations ? Wheelchair (measurements PT);Wheelchair cushion (measurements PT);BSC/3in1 (pt is 6'6)  ?  ?Recommendations for Other Services   ? ? ?  ?Precautions / Restrictions Precautions ?Precautions: Fall ?Precaution Comments: legally blind, A-fib, watch  HR ?Restrictions ?Weight Bearing Restrictions: No  ?  ? ?Mobility ? Bed Mobility ?Overal bed mobility: Needs Assistance ?Bed Mobility: Rolling, Supine to Sit ?Rolling: Mod assist ?  ?Supine to sit: Mod assist, +2 for physical assistance ?  ?  ?General bed mobility comments: Pt requiring modA to roll to R/L for peri care, hand over hand guidance for locating railing. Pt initiating well with moving BLE's off edge of bed, truncal assist to upright ?  ? ?Transfers ?Overall transfer level: Needs assistance ?Equipment used: Ambulation equipment used ?Transfers: Sit to/from Stand ?Sit to Stand: Mod assist, +2 physical assistance ?  ?  ?  ?  ?  ?General transfer comment: Pt performed ~5 sit to stands from edge of bed, BSC, and to and from [place] with up to [place] + 2. more difficulty with rising to stand from lower surface, cues for glute activation and upright posture. ?Transfer via Lift Equipment: [place] ? ?Ambulation/Gait ?  ?  ?  ?  ?  ?  ?  ?General Gait Details: unable ? ? ?Stairs ?  ?  ?  ?  ?  ? ? ?Wheelchair Mobility ?  ? ?Modified Rankin (Stroke Patients Only) ?  ? ? ?  ?Balance Overall balance assessment: Needs assistance ?Sitting-balance support: Feet supported, No upper extremity supported ?Sitting balance-Leahy Scale: Fair ?Sitting balance - Comments: Progressing to fair, no UE support, close supervision ?  ?  ?  ?  ?  ?  ?  ?  ?  ?  ?  ?  ?  ?  ?  ?  ? ?  ?Cognition Arousal/Alertness: Awake/alert ?Behavior During Therapy: [facility] for tasks assessed/performed ?Overall Cognitive Status: Within Functional Limits for tasks assessed ?  ?  ?  ?  ?  ?  ?  ?  ?  ?  ?  ?  ?  ?  ?  ?  ?  ?  ?  ? ?  ?  Exercises   ? ?  ?General Comments   ?  ?  ? ?Pertinent Vitals/Pain Pain Assessment ?Pain Assessment: Faces ?Faces Pain Scale: Hurts little more ?Pain Location: LLE, knees ?Pain Descriptors / Indicators: Grimacing, Guarding, Discomfort, Aching ?Pain Intervention(s): Limited activity within patient's tolerance, Monitored  during session  ? ? ?Home Living   ?  ?  ?  ?  ?  ?  ?  ?  ?  ?   ?  ?Prior Function    ?  ?  ?   ? ?PT Goals (current goals can now be found in the care plan section) Acute Rehab PT Goals ?Patient Stated Goal: decrease pain, to move more ?Potential to Achieve Goals: Fair ?Progress towards PT goals: Progressing toward goals ? ?  ?Frequency ? ? ? Min 3X/week ? ? ? ?  ?PT Plan Current plan remains appropriate  ? ? ?Co-evaluation PT/OT/SLP Co-Evaluation/Treatment: Yes ?Reason for Co-Treatment: Complexity of the patient's impairments (multi-system involvement);For patient/therapist safety;To address functional/ADL transfers ?PT goals addressed during session: Mobility/safety with mobility ?  ?  ? ?  ?AM-PAC PT "6 Clicks" Mobility   ?Outcome Measure ? Help needed turning from your back to your side while in a flat bed without using bedrails?: A Lot ?Help needed moving from lying on your back to sitting on the side of a flat bed without using bedrails?: A Lot ?Help needed moving to and from a bed to a chair (including a wheelchair)?: A Lot ?Help needed standing up from a chair using your arms (e.g., wheelchair or bedside chair)?: Total ?Help needed to walk in hospital room?: Total ?Help needed climbing 3-5 steps with a railing? : Total ?6 Click Score: 9 ? ?  ?End of Session Equipment Utilized During Treatment: Gait belt ?Activity Tolerance: Patient tolerated treatment well ?Patient left: in chair;with call bell/phone within reach;with chair alarm set ?Nurse Communication: Mobility status ?PT Visit Diagnosis: Pain;Muscle weakness (generalized) (M62.81);Difficulty in walking, not elsewhere classified (R26.2) ?Pain - Right/Left: Left ?Pain - part of body: Shoulder;Arm;Hand ?  ? ? ?Time: 5852-7782 ?PT Time Calculation (min) (ACUTE ONLY): 41 min ? ?Charges:  $Therapeutic Activity: 23-37 mins          ?          ? ?Wyona Almas, PT, DPT ?Acute Rehabilitation Services ?Pager [phone] ?Office [phone] ? ? ? ?Carloine Margo Aye ?05/10/2021, 12:32 PM ? ?

## 2021-05-10 NOTE — Progress Notes (Signed)
?[facility] ?Progress Note  ? ?Subjective:   Feeling ok this AM. Wife bedside.  ?I/Os 370 / 130  ? ?Objective ?Vitals:  ? 05/09/21 2053 05/10/21 0046 05/10/21 0555 05/10/21 2094  ?BP: 111/79 113/75 108/67 121/83  ?Pulse: 81 86 88 88  ?Resp: '13 11 14 16  '$ ?Temp: 98.3 ?F (36.8 ?C) 98.5 ?F (36.9 ?C) 97.9 ?F (36.6 ?C) 98.1 ?F (36.7 ?C)  ?TempSrc: Oral Oral Oral Oral  ?SpO2: 100% 98% 98% 98%  ?Weight:   106.4 kg   ?Height:      ? ?Physical Exam ?General: sitting calmly in chair ?Heart: RRR, no rub ?Lungs: clear ?Abdomen: soft ?Extremities: 2+ tibial edema = wife says much improved ?Dialysis Access:  [facility] TDC ? ?Additional Objective ?Labs: ?Basic Metabolic Panel: ?Recent Labs  ?Lab 05/09/21 ?0321 05/09/21 ?1600 05/10/21 ?0216  ?NA 134* 133* 134*  ?K 3.6 3.6 3.8  ?CL 97* 97* 97*  ?CO2 '29 26 30  '$ ?GLUCOSE 106* 130* 111*  ?BUN 22 28* 31*  ?CREATININE 4.23* 4.85* 5.24*  ?CALCIUM 8.3* 8.3* 8.7*  ?PHOS 2.2* 2.5 2.9  ? ?Liver Function Tests: ?Recent Labs  ?Lab 05/09/21 ?0321 05/09/21 ?1600 05/10/21 ?0216  ?ALBUMIN 2.4* 2.4* 2.3*  ? ?No results for input(s): LIPASE, AMYLASE in the last 168 hours. ?CBC: ?Recent Labs  ?Lab 05/04/21 ?0144 05/06/21 ?0203 05/08/21 ?7096  ?WBC 6.1 7.4 7.4  ?NEUTROABS  --  5.6  --   ?HGB 9.5* 9.3* 8.9*  ?HCT 28.0* 27.3* 26.1*  ?MCV 94.6 95.1 94.9  ?PLT 164 156 183  ? ?Blood Culture ?No results found for: SDES, [place], [place], REPTSTATUS ? ?Cardiac Enzymes: ?No results for input(s): CKTOTAL, CKMB, CKMBINDEX, TROPONINI in the last 168 hours. ?CBG: ?Recent Labs  ?Lab 05/05/21 ?2128 05/06/21 ?0602 05/06/21 ?1428 05/09/21 ?2109 05/10/21 ?2836  ?GLUCAP 143* 109* 92 121* 108*  ? ?Iron Studies: No results for input(s): IRON, TIBC, TRANSFERRIN, FERRITIN in the last 72 hours. ?'@lablastinr3'$ @ ?Studies/Results: ?No results found. ?Medications: ? sodium chloride Stopped (04/28/21 1032)  ? sodium chloride irrigation Stopped (05/08/21 1814)  ? ? apixaban  2.5 mg Oral BID  ? aspirin EC  81 mg Oral Daily   ? atropine  1 drop Right Eye QHS  ? brimonidine  1 drop Left Eye TID  ? Chlorhexidine Gluconate Cloth  6 each Topical Q0600  ? dorzolamide-timolol  1 drop Left Eye BID  ? doxazosin  8 mg Oral Daily  ? dutasteride  0.5 mg Oral Q M,W,F  ? feeding supplement  237 mL Oral TID BM  ? latanoprost  1 drop Left Eye QHS  ? mouth rinse  15 mL Mouth Rinse BID  ? multivitamin with minerals  1 tablet Oral Daily  ? pantoprazole  40 mg Oral Daily  ? pilocarpine  1 drop Left Eye TID  ? polyethylene glycol  17 g Oral BID  ? prednisoLONE acetate  1 drop Right Eye QHS  ? senna-docusate  1 tablet Oral BID  ? sodium chloride flush  10-40 mL Intracatheter Q12H  ? triamcinolone ointment  1 application. Topical Daily  ? ? 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  Required CRRT 2/26 - 3/1 and has been subsequently requiring iHD since then.  Intradialytic rise in Cr + low UOP suggestive of no recovery.  Vein mapping + VVS consult re: perm access today, appreciated.  HD TTS, next tomorrow.   CLIP underway - has outpt arranged  for [facility] TTS 12:30 when ready.  ? ?**Anemia:  had some blood loss with hematuria but this is resolved.  Hb 8.9, appears iron replete. Clarify prostate ca prior to ESA.  ? ?**A fib:  rate controlled on BB, on eliquis ? ?**BMM:  ca and phos ok, check PTH.  ? ?**Hyponatremia:  hypervolemic, improving with HD/UF.  ? ?**DM ?**h/o gout ? ?Will follow, call with concerns.  ? ?Jannifer Hick MD ?05/10/2021, 11:29 AM  ?[facility] ?Pager: [phone] ? ? ?

## 2021-05-10 NOTE — H&P (Incomplete)
Physical Medicine and Rehabilitation Admission H&P    Chief Complaint  Patient presents with   Shortness of Breath   Weakness   Dysuria  CC: Debility secondary to volume overload, uremia now on intermittent hemodialysis, new onset atrial fibrillation  HPI: Austin Wheeler is an 81 year old male presented to [facility] emergency department on the evening of 04/23/2021 with complaints of shortness of breath generalized weakness and intermittently to urinate for several days.  This had progressed over the last 3 weeks.  Additionally, he was complaining of lower extremity edema.  He initially contacted his primary care provider who instructed him to proceed to the ED.  Initial evaluation revealed atrial fibrillation with heart rate in the 70s to 80s.  He was placed on heparin infusion.  Labs are notable for potassium 6.1 BUN 118 creatinine 15.52, BNP 1183 lactate 3.8.  CT scan revealed large volume ascites with diffuse anasarca, no obstructive uropathy.  A Foley catheter was placed and nephrology consulted.  Ultrasound-guided paracentesis was performed on the same day which yielded 8.3 L of clear yellow fluid.  He developed gross hematuria in the early morning of 2/25 and his heparin was discontinued.  Intravenous Lasix was initiated.  Echocardiography performed 2/25>> ejection fraction estimated 50 to 60%.  Given albumin secondary to hypotension.  Nephrology discussed with the patient and his wife the probability of needing hemodialysis.  Palliative care consultation was obtained on 2/26 to establish goals of care.  Is agreeable to dialysis.  He was transferred to the ICU and critical care medicine consulted and placed on CRRT after placement of nontunneled central venous catheter on 2/26.   Urology was consulted on 3/1>> history of benign prostatic hypertrophy on doxazosin and dutasteride.  Hematuria likely due to combination of catheter trauma, benign prostatic hypertrophy and  anticoagulation.  Okay to resume heparin.  Patient's electrolyte disturbance improved and he underwent kidney biopsy on 3/6.  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  Renal recovery not expected.  Apixaban 2.5 mg initiated 3/8.T he patient requires inpatient medicine and rehabilitation evaluations and services for ongoing dysfunction secondary to severe acute tubular necrosis with kidney failure and initiation of hemodialysis; new onset atrial fibrillation.  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  ROS Past Medical History:  Diagnosis Date   Blind    Diabetes mellitus without complication ([place])    Hypertension    Past Surgical History:  Procedure Laterality Date   IR FLUORO GUIDE CV LINE RIGHT  05/03/2021   IR PARACENTESIS  04/23/2021   IR US GUIDE VASC ACCESS RIGHT  05/03/2021   History reviewed. No pertinent family history. Social History:  reports that he has never smoked. He has never used smokeless tobacco. He reports that he does not currently use alcohol. He reports that he does not use drugs. Allergies:  Allergies  Allergen Reactions   Guaifenesin Swelling and Rash    Hand and feet swelling from Robitussin   Influenza Vaccines Other (See Comments)    Caused flu-like illness   Lexapro [Escitalopram] Nausea And Vomiting    Reported by [facility] - pt does not recall   Medications Prior to Admission  Medication Sig Dispense Refill   acetaminophen (TYLENOL) 500 MG tablet Take 500 mg by mouth daily as needed for headache (pain).     amLODipine (NORVASC) 10 MG tablet Take 10 mg by mouth every  morning.     aspirin EC 81 MG tablet 81 mg every morning.     atropine 1 % ophthalmic solution Place 1 drop into the right eye at bedtime.     brimonidine (ALPHAGAN) 0.2 % ophthalmic solution Place 1 drop into the  left eye 3 (three) times daily.     cholecalciferol (VITAMIN D3) 25 MCG (1000 UNIT) tablet Take 1,000 Units by mouth 2 (two) times daily.     dorzolamide-timolol (COSOPT) 22.3-6.8 MG/ML ophthalmic solution Place 1 drop into the left eye 2 (two) times daily.     doxazosin (CARDURA) 8 MG tablet Take 1 tablet (8 mg total) by mouth daily. (Patient taking differently: Take 8 mg by mouth every morning.) 90 tablet 3   dutasteride (AVODART) 0.5 MG capsule Take 1 capsule (0.5 mg total) by mouth daily. (Patient taking differently: Take 0.5 mg by mouth every Monday, Wednesday, and Friday.) 90 capsule 3   furosemide (LASIX) 40 MG tablet Take 40 mg by mouth every morning.     Garlic 300 MG TABS Take 500 mg by mouth every morning.     latanoprost (XALATAN) 0.005 % ophthalmic solution Place 1 drop into the left eye at bedtime.     metFORMIN (GLUCOPHAGE) 500 MG tablet Take 250 mg by mouth 2 (two) times daily.     metoprolol tartrate (LOPRESSOR) 50 MG tablet Take 50 mg by mouth 2 (two) times daily.     Multiple Vitamin (MULTIVITAMIN WITH MINERALS) TABS tablet Take 1 tablet by mouth in the morning.     Multiple Vitamins-Minerals (PRESERVISION AREDS 2) CAPS Take 1 capsule by mouth 2 (two) times daily.     olmesartan-hydrochlorothiazide (BENICAR HCT) 40-12.5 MG tablet Take 1 tablet by mouth every morning.     OVER THE COUNTER MEDICATION Take 1 capsule by mouth See admin instructions. Go Out (OTC) - take one capsule by mouth on Tuesday and Thursday morning (for gout)     pilocarpine (PILOCAR) 4 % ophthalmic solution Place 1 drop into the left eye 3 (three) times daily.  11   potassium chloride SA (KLOR-CON M) 20 MEQ tablet Take 10 mEq by mouth every morning.     prednisoLONE acetate (PRED FORTE) 1 % ophthalmic suspension Place 1 drop into the right eye at bedtime.     triamcinolone ointment (KENALOG) 0.1 % Apply 1 application topically See admin instructions. Apply topically legs nightly after compression stockings are  removed     vitamin C (ASCORBIC ACID) 500 MG tablet Take 500 mg by mouth every Saturday.     vitamin E 180 MG (400 UNITS) capsule Take 400 Units by mouth every morning.        Home: Home Living Family/patient expects to be discharged to:: Private residence Living Arrangements: Spouse/significant other Available Help at Discharge: Family, Available 24 hours/day Type of Home: House Home Access: Stairs to enter [employer] of Steps: 3 Entrance Stairs-Rails: Right Home Layout: One level Bathroom Shower/Tub: Multimedia programmer: Standard Bathroom Accessibility: Yes Home Equipment: Conservation officer, nature (2 wheels), Rollator (4 wheels)  Lives With: Spouse   Functional History: Prior Function Prior Level of Function : Needs assist Mobility Comments: Uses RW for ambulation; no falls. ADLs Comments: Independent.  Wife assists with community mobility, meds, IADLs and home management.  Functional Status:  Mobility: Bed Mobility Overal bed mobility: Needs Assistance Bed Mobility: Rolling, Supine to Sit Rolling: Mod assist Sidelying to sit: Max assist, +2 for physical assistance Supine to sit: Mod assist, +2 for  physical assistance Sit to supine: Mod assist, +2 for physical assistance General bed mobility comments: Pt requiring modA to roll to R/L for peri care, hand over hand guidance for locating railing. Pt initiating well with moving BLE's off edge of bed, truncal assist to upright Transfers Overall transfer level: Needs assistance Equipment used: Ambulation equipment used Transfers: Sit to/from Stand Sit to Stand: Mod assist, +2 physical assistance Bed to/from chair/wheelchair/BSC transfer type:: Via Lift equipment Transfer via Lift Equipment: [place] transfer comment: Pt performed ~5 sit to stands from edge of bed, BSC, and to and from [place] with up to [place] + 2. more difficulty with rising to stand from lower surface, cues for glute activation and  upright posture. Ambulation/Gait General Gait Details: unable Pre-gait activities: pt unable to initiate stepping today 2/2 fatigue after multiple standing transfers and toileting    ADL: ADL Overall ADL's : Needs assistance/impaired Eating/Feeding: Moderate assistance, Bed level Grooming: Wash/dry face, Supervision/safety, Set up, Bed level Grooming Details (indicate cue type and reason): Providing wash clothe to pt in supine and then pt washign his face with increased time Upper Body Bathing: Moderate assistance, Bed level Lower Body Bathing: Maximal assistance, Bed level Lower Body Bathing Details (indicate cue type and reason): required assistance with cleaning following BM in bed Upper Body Dressing : Maximal assistance, Sitting Lower Body Dressing: Total assistance, Bed level Toilet Transfer: Moderate assistance, +2 for physical assistance Toilet Transfer Details (indicate cue type and reason): transfer to [facility] with stedy Toileting- Clothing Manipulation and Hygiene: Maximal assistance, Sit to/from stand Toileting - Clothing Manipulation Details (indicate cue type and reason): stood in stedy for toilet hygiene Functional mobility during ADLs: Moderate assistance, +2 for physical assistance (sit<>stand with stedy) General ADL Comments: Patient performed rolling in bed to clean following BM and also stood in [place] to clean after sitting on BSC  Cognition: Cognition Overall Cognitive Status: Within Functional Limits for tasks assessed Orientation Level: Oriented X4 Cognition Arousal/Alertness: Awake/alert Behavior During Therapy: WFL for tasks assessed/performed Overall Cognitive Status: Within Functional Limits for tasks assessed General Comments: followed directions well but often required repeating due to blindness.  Motivated towards therapy  Physical Exam: Blood pressure 108/76, pulse 93, temperature 97.9 F (36.6 C), temperature source Oral, resp. rate 18, height '6\' 6"'$   (1.981 m), weight 106.4 kg, SpO2 98 %. Physical Exam  Results for orders placed or performed during the hospital encounter of 04/23/21 (from the past 48 hour(s))  Renal function panel (daily at 1600)     Status: Abnormal   Collection Time: 05/08/21  3:54 PM  Result Value Ref Range   Sodium 132 (L) 135 - 145 mmol/L   Potassium 3.4 (L) 3.5 - 5.1 mmol/L   Chloride 96 (L) 98 - 111 mmol/L   CO2 25 22 - 32 mmol/L   Glucose, Bld 116 (H) 70 - 99 mg/dL    Comment: Glucose reference range applies only to samples taken after fasting for at least 8 hours.   BUN 19 8 - 23 mg/dL   Creatinine, Ser 3.67 (H) 0.61 - 1.24 mg/dL   Calcium 8.5 (L) 8.9 - 10.3 mg/dL   Phosphorus 1.8 (L) 2.5 - 4.6 mg/dL   Albumin 2.4 (L) 3.5 - 5.0 g/dL   GFR, Estimated 16 (L) >60 mL/min    Comment: (NOTE) Calculated using the CKD-EPI Creatinine Equation (2021)    Anion gap 11 5 - 15    Comment: Performed at [facility]  [address] [place], [place] [address]  Renal function panel     Status: Abnormal   Collection Time: 05/09/21  3:21 AM  Result Value Ref Range   Sodium 134 (L) 135 - 145 mmol/L   Potassium 3.6 3.5 - 5.1 mmol/L   Chloride 97 (L) 98 - 111 mmol/L   CO2 29 22 - 32 mmol/L   Glucose, Bld 106 (H) 70 - 99 mg/dL    Comment: Glucose reference range applies only to samples taken after fasting for at least 8 hours.   BUN 22 8 - 23 mg/dL   Creatinine, Ser 4.23 (H) 0.61 - 1.24 mg/dL   Calcium 8.3 (L) 8.9 - 10.3 mg/dL   Phosphorus 2.2 (L) 2.5 - 4.6 mg/dL   Albumin 2.4 (L) 3.5 - 5.0 g/dL   GFR, Estimated 13 (L) >60 mL/min    Comment: (NOTE) Calculated using the CKD-EPI Creatinine Equation (2021)    Anion gap 8 5 - 15    Comment: Performed at [facility] [address] [place], [place] [address]  Magnesium     Status: None   Collection Time: 05/09/21  3:21 AM  Result Value Ref Range   Magnesium 2.1 1.7 - 2.4 mg/dL    Comment: Performed at [facility] [address],  [place], [place] [address]  Renal function panel (daily at 1600)     Status: Abnormal   Collection Time: 05/09/21  4:00 PM  Result Value Ref Range   Sodium 133 (L) 135 - 145 mmol/L   Potassium 3.6 3.5 - 5.1 mmol/L   Chloride 97 (L) 98 - 111 mmol/L   CO2 26 22 - 32 mmol/L   Glucose, Bld 130 (H) 70 - 99 mg/dL    Comment: Glucose reference range applies only to samples taken after fasting for at least 8 hours.   BUN 28 (H) 8 - 23 mg/dL   Creatinine, Ser 4.85 (H) 0.61 - 1.24 mg/dL   Calcium 8.3 (L) 8.9 - 10.3 mg/dL   Phosphorus 2.5 2.5 - 4.6 mg/dL   Albumin 2.4 (L) 3.5 - 5.0 g/dL   GFR, Estimated 11 (L) >60 mL/min    Comment: (NOTE) Calculated using the CKD-EPI Creatinine Equation (2021)    Anion gap 10 5 - 15    Comment: Performed at [facility] [address], [place], [place] [address]  Glucose, capillary     Status: Abnormal   Collection Time: 05/09/21  9:09 PM  Result Value Ref Range   Glucose-Capillary 121 (H) 70 - 99 mg/dL    Comment: Glucose reference range applies only to samples taken after fasting for at least 8 hours.  Glucose, capillary     Status: Abnormal   Collection Time: 05/10/21 12:44 AM  Result Value Ref Range   Glucose-Capillary 108 (H) 70 - 99 mg/dL    Comment: Glucose reference range applies only to samples taken after fasting for at least 8 hours.  Renal function panel (daily at 0500)     Status: Abnormal   Collection Time: 05/10/21  2:16 AM  Result Value Ref Range   Sodium 134 (L) 135 - 145 mmol/L   Potassium 3.8 3.5 - 5.1 mmol/L   Chloride 97 (L) 98 - 111 mmol/L   CO2 30 22 - 32 mmol/L   Glucose, Bld 111 (H) 70 - 99 mg/dL    Comment: Glucose reference range applies only to samples taken after fasting for at least 8 hours.   BUN 31 (  H) 8 - 23 mg/dL   Creatinine, Ser 5.24 (H) 0.61 - 1.24 mg/dL   Calcium 8.7 (L) 8.9 - 10.3 mg/dL   Phosphorus 2.9 2.5 - 4.6 mg/dL   Albumin 2.3 (L) 3.5 - 5.0 g/dL   GFR, Estimated 10 (L) >60 mL/min    Comment:  (NOTE) Calculated using the CKD-EPI Creatinine Equation (2021)    Anion gap 7 5 - 15    Comment: Performed at [facility] [address] [place], [place] [address]  Magnesium     Status: None   Collection Time: 05/10/21  2:16 AM  Result Value Ref Range   Magnesium 2.2 1.7 - 2.4 mg/dL    Comment: Performed at [facility] [address], [place], [place] [address]   No results found.    Blood pressure 108/76, pulse 93, temperature 97.9 F (36.6 C), temperature source Oral, resp. rate 18, height '6\' 6"'$  (1.981 m), weight 106.4 kg, SpO2 98 %.  Medical Problem List and Plan: 1. Functional deficits secondary to Debility secondary to volume overload, uremia now on intermittent hemodialysis, new onset atrial fibrillation  -patient may *** shower  -ELOS/Goals: *** 2.  Antithrombotics: -DVT/anticoagulation:  Pharmaceutical: Other (comment)Eliquis 2.5 mg BID  -antiplatelet therapy: none 3. Pain Management: Tylenol, hydrocodone as needed 4. Mood: LCSW to evaluate and provide emotional support  -antipsychotic agents: Not applicable 5. Neuropsych: This patient is capable of making decisions on his own behalf. 6. Skin/Wound Care: Routine skin checks  -- Kenalog as needed to both lower legs 7. Fluids/Electrolytes/Nutrition: Labs per nephrology  -- Dysphagia 3 diet/thin liquids  --Continue multivitamin daily 8: Atrial fibrillation: Continue Eliquis 2.5 mg twice daily 9: Biventricular heart failure  -- Daily weight 10: Ascites: Status post paracentesis.  Monitor for re-accumulation 11: Acute renal failure with hyperkalemia and metabolic acidosis, severe ATN.  Recovery not expected.  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

## 2021-05-10 NOTE — Progress Notes (Signed)
Navigator following to assist with out-pt HD needs at d/c. Pt has been accepted at [facility] on TTS with 12:30 chair. Will follow to advise clinic of start date once known.  ? ?Melven Sartorius ?Renal Navigator ?[phone] ?

## 2021-05-10 NOTE — Progress Notes (Signed)
Occupational Therapy Treatment ?Patient Details ?Name: Jamon Hayhurst Carreiro ?MRN: [id number] ?DOB: January 13, 1941 ?Today's Date: 05/10/2021 ? ? ?History of present illness 81 y/o male who presents on 04/23/21 with SOB, weakness and swelling abdomen/LEs. Found to have new onset A-fib, AKI injury of unknown origin started on HD, s/p paracentesis 2/24. Renal biopsy planned fof 05/03/21. PMH includes DM, HTN, legally blind. ?  ?OT comments ? Patient received in supine and agreeable to PT/OT session.  Patient making progress with therapy with mod assist to roll in bed for cleaning and stood from EOB to [place] with less assistance. Patient stood ~5x from EOB and [facility] with more assitance from [facility] due to lower surface.  Patient continues to be max assist for toilet hygiene. Patient is motivated towards therapy and is a good candidate for AIR to further address functional transfers and self care.   ? ?Recommendations for follow up therapy are one component of a multi-disciplinary discharge planning process, led by the attending physician.  Recommendations may be updated based on patient status, additional functional criteria and insurance authorization. ?   ?Follow Up Recommendations ? Acute inpatient rehab (3hours/day)  ?  ?Assistance Recommended at Discharge Frequent or constant Supervision/Assistance  ?Patient can return home with the following ? Two people to help with walking and/or transfers;Two people to help with bathing/dressing/bathroom;Assistance with cooking/housework;Help with stairs or ramp for entrance;Assist for transportation;Direct supervision/assist for financial management;Direct supervision/assist for medications management ?  ?Equipment Recommendations ? Wheelchair (measurements OT);Wheelchair cushion (measurements OT);Tub/shower seat;BSC/3in1;Hospital bed  ?  ?Recommendations for Other Services   ? ?  ?Precautions / Restrictions Precautions ?Precautions: Fall ?Precaution Comments: legally blind, A-fib, watch  HR ?Restrictions ?Weight Bearing Restrictions: No  ? ? ?  ? ?Mobility Bed Mobility ?  ?  ?  ?  ?  ?  ?  ?  ?  ? ?Transfers ?  ?  ?  ?  ?  ?  ?  ?  ?  ?  ?  ?  ?Balance   ?  ?  ?  ?  ?Standing balance support: Bilateral upper extremity supported ?Standing balance-Leahy Scale: Poor ?Standing balance comment: able to stand in stedy for toilet hygiene ?  ?  ?  ?  ?  ?  ?  ?  ?  ?  ?  ?   ? ?ADL either performed or assessed with clinical judgement  ? ?ADL Overall ADL's : Needs assistance/impaired ?  ?  ?  ?  ?  ?  ?Lower Body Bathing: Maximal assistance;Bed level ?Lower Body Bathing Details (indicate cue type and reason): required assistance with cleaning following BM in bed ?  ?  ?  ?  ?Toilet Transfer: Moderate assistance;+2 for physical assistance ?Toilet Transfer Details (indicate cue type and reason): transfer to [facility] with stedy ?Toileting- Clothing Manipulation and Hygiene: Maximal assistance;Sit to/from stand ?Toileting - Clothing Manipulation Details (indicate cue type and reason): stood in stedy for toilet hygiene ?  ?  ?  ?General ADL Comments: Patient performed rolling in bed to clean following BM and also stood in [place] to clean after sitting on BSC ?  ? ?Extremity/Trunk Assessment   ?  ?  ?  ?  ?  ? ?Vision   ?  ?  ?Perception   ?  ?Praxis   ?  ? ?Cognition   ?  ?  ?  ?  ?  ?  ?  ?  ?  ?  ?  ?  ?  ?  ?  ?  ?  ?  ?  General Comments: followed directions well but often required repeating due to blindness.  Motivated towards therapy ?  ?  ?   ?Exercises   ? ?  ?Shoulder Instructions   ? ? ?  ?General Comments    ? ? ?Pertinent Vitals/ Pain         ? ?Home Living   ?  ?  ?  ?  ?  ?  ?  ?  ?  ?  ?  ?  ?  ?  ?  ?  ?  ?  ? ?  ?Prior Functioning/Environment    ?  ?  ?  ?   ? ?Frequency ? Min 2X/week  ? ? ? ? ?  ?Progress Toward Goals ? ?OT Goals(current goals can now be found in the care plan section) ? Progress towards OT goals: Progressing toward goals ? ?Acute Rehab OT Goals ?Patient Stated Goal: go to rehab ?OT  Goal Formulation: With patient/family ?Time For Goal Achievement: 05/14/21 ?Potential to Achieve Goals: Fair ?ADL Goals ?Pt Will Perform Grooming: with set-up;sitting ?Pt Will Perform Upper Body Bathing: with set-up;sitting ?Pt Will Perform Upper Body Dressing: with set-up;sitting ?Pt Will Transfer to Toilet: with mod assist;stand pivot transfer;bedside commode ?Pt/caregiver will Perform Home Exercise Program: Increased strength;Both right and left upper extremity;With minimal assist;With written HEP provided  ?Plan Discharge plan remains appropriate   ? ?Co-evaluation ? ? ? PT/OT/SLP Co-Evaluation/Treatment: Yes ?Reason for Co-Treatment: Complexity of the patient's impairments (multi-system involvement);For patient/therapist safety;To address functional/ADL transfers ?PT goals addressed during session: Mobility/safety with mobility ?OT goals addressed during session: ADL's and self-care ?  ? ?  ?AM-PAC OT "6 Clicks" Daily Activity     ?Outcome Measure ? ? Help from another person eating meals?: A Little ?Help from another person taking care of personal grooming?: A Little ?Help from another person toileting, which includes using toliet, bedpan, or urinal?: A Lot ?Help from another person bathing (including washing, rinsing, drying)?: A Lot ?Help from another person to put on and taking off regular upper body clothing?: A Lot ?Help from another person to put on and taking off regular lower body clothing?: Total ?6 Click Score: 13 ? ?  ?End of Session Equipment Utilized During Treatment: Gait belt;Other (comment) Charlaine Dalton) ? ?OT Visit Diagnosis: Unsteadiness on feet (R26.81);Muscle weakness (generalized) (M62.81);Pain ?  ?Activity Tolerance Patient tolerated treatment well ?  ?Patient Left in chair;with call bell/phone within reach;with chair alarm set ?  ?Nurse Communication Mobility status;Need for lift equipment ?  ? ?   ? ?Time: 5320-2334 ?OT Time Calculation (min): 41 min ? ?Charges: OT General Charges ?$OT Visit:  1 Visit ?OT Treatments ?$Self Care/Home Management : 8-22 mins ? ?Lodema Hong, OTA ?Acute Rehabilitation Services  ?Pager [phone] ?Office [phone] ? ? ?[place] ?05/10/2021, 12:49 PM ?

## 2021-05-10 NOTE — Discharge Summary (Incomplete)
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  Patient started to have increasing bilateral lower extremities swelling about 3 months ago and was started of po Lasix 40 mg daily, swelling appears to be improving, patient however had episodes of feeling lightheadedness and "too much urination" and decided to cut down Lasix from 40 mg daily to 20 mg daily about 1 month ago. Condition remained stable since until about 1 week ago, patient started to notice swelling in his abdomen and legs, and decreased urine output.  Finally, for the past 3 days, there has been barely any urine came out. He said he feeling "bladder fullness" when having BM, but no urine came out. No abdominal pain, no back pain, no shortness of breath, no fever or chills. No Hx of CHF or stroke   2/24: Admitted to [facility] for acute on chronic renal failure, new onset a. Fib. Nephrology consulted; high dose Lasix trial initiated, large volume paracentesis of 8.3 L 2/26: PCCM consulted. Transfer to ICU for CRRT  2/27: tolerated CRRT 3/1 Heparin drip restarted 2/28 and patient has had recurrent hematuria and bleeding IV site. Heparin drip now on hold  3/6 - Renal biopsy obtained 3/7 - transition to iHD, tolerating well 3/8 -3/10 --> Awaiting transfer to CIR; continue to discuss goals of care with palliative team 3/12 -Foley discontinued     Discharge Diagnoses:   Acute Kidney Failure with Anuria requiring RRT - now transitioning to HD Anasarca, resolving - Nephro following - continues on HD - Patient with acute kidney injury of unclear origin. Differential includes ATN versus nephrotic syndrome versus acute GN given hematuria. 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   - CHADVASC score of 4 (age, HTN, DM), initially holding anticoagulation in setting of gross hematuria.  - 3/7 Heparin drip - discontinued - 3/9 transition to Eliquis 2.5 twice daily given age and GFR   Acute anemia, acute blood loss anemia unlikely chronic anemia of chronic disease.  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 - Continue home ophthalmic meds    Hyponatremia, improving - Likely secondary to volume overload - Continue on dialysis with nephrology   Thrombocytopenia.  - Resolved   Type 2 DM uncontrolled with Hypoglycemia.  - Hypoglycemia resolved.  - CBGs within goal -A1c 4.8   Hyperkalemia - Resolved with HD   Metabolic acidosis  -Resolved   L great toe skin breakdown, POA Gout, history of -Patient complaining of diffuse joint pain daily, uric acid low; low risk for acute gout flare    Discharge Instructions  Allergies as of 05/10/2021       Reactions   Guaifenesin Swelling, Rash   Hand and feet swelling from Robitussin   Influenza Vaccines Other (See Comments)   Caused flu-like illness   Lexapro [escitalopram] Nausea  And Vomiting   Reported by Austin Wheeler - pt does not recall     Med Rec must be completed prior to using this SMARTLINK***       Allergies  Allergen Reactions   Guaifenesin Swelling and Rash    Hand and feet swelling from Robitussin   Influenza Vaccines Other (See Comments)    Caused flu-like illness   Lexapro [Escitalopram] Nausea And Vomiting    Reported by Austin Wheeler - pt does not recall    Consultations: ***Specify Physician/Group   Procedures/Studies: CT ABDOMEN PELVIS WO CONTRAST  Result Date: 04/23/2021 CLINICAL DATA:  Renal failure, anuria EXAM: CT ABDOMEN AND PELVIS WITHOUT CONTRAST TECHNIQUE: Multidetector CT imaging of the abdomen and pelvis was performed following the standard protocol without IV contrast. RADIATION DOSE REDUCTION: This exam was performed according to the departmental dose-optimization program which includes automated exposure control, adjustment of the mA and/or kV according to patient size and/or use of iterative reconstruction technique. COMPARISON:  None. FINDINGS: Lower chest: Trace bilateral pleural effusions with associated compressive atelectasis. Cardiomegaly. Coronary artery atherosclerosis. Hepatobiliary: Unremarkable unenhanced appearance of the liver. No focal liver lesion identified. Gallbladder within normal limits. No hyperdense gallstone. No biliary dilatation. Pancreas: Unremarkable. No pancreatic ductal dilatation or surrounding inflammatory changes. Spleen: Normal in size without focal abnormality. Adrenals/Urinary Tract: Unremarkable adrenal glands. 3.3 cm lower pole left renal cyst. Kidneys are otherwise unremarkable. No renal stone or hydronephrosis. Urinary bladder is decompressed by Foley catheter. Stomach/Bowel: Small hiatal hernia. Stomach appears otherwise within normal limits. Colonic diverticulosis. No evidence of bowel wall thickening, distention, or inflammatory changes. Vascular/Lymphatic: Scattered aortoiliac  atherosclerotic calcifications without aneurysm. No abdominopelvic lymphadenopathy. Reproductive: Prostatomegaly. Other: Moderate-large volume ascites. No pneumoperitoneum. No abdominal wall hernia. Musculoskeletal: Diffuse anasarca. Scattered densely sclerotic bone islands within the pelvis and spine. No acute osseous findings. IMPRESSION: 1. No evidence of obstructive uropathy. 2. Moderate-large volume ascites with diffuse anasarca. 3. Trace bilateral pleural effusions with associated compressive atelectasis. 4. Colonic diverticulosis without evidence of acute diverticulitis. 5. Prostatomegaly. 6. Aortic Atherosclerosis (ICD10-I70.0). Electronically Signed   By: Davina Poke D.O.   On: 04/23/2021 14:13   US RENAL  Result Date: 04/23/2021 CLINICAL DATA:  Acute kidney injury EXAM: RENAL / URINARY TRACT ULTRASOUND COMPLETE COMPARISON:  Same day CT. FINDINGS: Right Kidney: Renal measurements: 13.1 x 6.0 x 7.3 cm = volume: 300.4 mL. Increased renal cortical echogenicity. No hydronephrosis. Left Kidney: Renal measurements: 13.9 x 5.7 x 6.5 cm = volume: 269.0 mL. Increased renal cortical echogenicity. No hydronephrosis. There is a simple appearing cyst in the inferior pole measuring 4.3 x 3.0 x 2.8 cm. Bladder: Foley catheter in place. Other: Bilateral pleural effusions.  Large volume ascites. IMPRESSION: Increased renal cortical echogenicity bilaterally, as can be seen in medical renal disease. No hydronephrosis. Bilateral pleural effusions and large volume ascites noted. Electronically Signed   By: Maurine Simmering M.D.   On: 04/23/2021 14:52   IR Fluoro Guide CV Line Right  Result Date: 05/03/2021 CLINICAL DATA:  Diabetes, renal insufficiency, needs durable venous access for hemodialysis EXAM: TUNNELED HEMODIALYSIS CATHETER PLACEMENT WITH ULTRASOUND AND FLUOROSCOPIC GUIDANCE TECHNIQUE: The procedure, risks, benefits, and alternatives were explained to the patient. Questions regarding the procedure were  encouraged and answered. The patient understands  and consents to the procedure. antibioPatency of the right IJ vein was confirmed with ultrasound with image documentation. An appropriate skin site was determined. Region was prepped using maximum barrier technique including cap and mask, sterile gown, sterile gloves, large sterile sheet, and Chlorhexidine as cutaneous antisepsis. The region was infiltrated locally with 1% lidocaine. sedationUnder real-time ultrasound guidance, the right IJ vein was accessed with a 21 gauge micropuncture needle; the needle tip within the vein was confirmed with ultrasound image documentation. Needle exchanged over the 018 guidewire for transitional dilator, which allowed advancement of a Benson wire into the IVC. Over this, an MPA catheter was advanced. A Palindrome 19 hemodialysis catheter was tunneled from the right anterior chest wall approach to the right IJ dermatotomy site. The MPA catheter was exchanged over an Amplatz wire for serial vascular dilators which allow placement of a peel-away sheath, through which the catheter was advanced under intermittent fluoroscopy, positioned with its tips in the proximal and midright atrium. Spot chest radiograph confirms good catheter position. No pneumothorax. Catheter was flushed and primed per protocol. Catheter secured externally with O Prolene sutures. The right IJ dermatotomy site was closed with Dermabond. COMPLICATIONS: COMPLICATIONS None immediate FLUOROSCOPY: Radiation Exposure Index (as provided by the fluoroscopic device): 4 mGy air Kerma COMPARISON:  None IMPRESSION: 1. Technically successful placement of tunneled right IJ hemodialysis catheter with ultrasound and fluoroscopic guidance. Ready for routine use. ACCESS: Remains approachable for percutaneous intervention as needed. Electronically Signed   By: Lucrezia Europe M.D.   On: 05/03/2021 16:14   IR US Guide Vasc Access Right  Result Date: 05/03/2021 CLINICAL DATA:  Diabetes,  renal insufficiency, needs durable venous access for hemodialysis EXAM: TUNNELED HEMODIALYSIS CATHETER PLACEMENT WITH ULTRASOUND AND FLUOROSCOPIC GUIDANCE TECHNIQUE: The procedure, risks, benefits, and alternatives were explained to the patient. Questions regarding the procedure were encouraged and answered. The patient understands and consents to the procedure. antibioPatency of the right IJ vein was confirmed with ultrasound with image documentation. An appropriate skin site was determined. Region was prepped using maximum barrier technique including cap and mask, sterile gown, sterile gloves, large sterile sheet, and Chlorhexidine as cutaneous antisepsis. The region was infiltrated locally with 1% lidocaine. sedationUnder real-time ultrasound guidance, the right IJ vein was accessed with a 21 gauge micropuncture needle; the needle tip within the vein was confirmed with ultrasound image documentation. Needle exchanged over the 018 guidewire for transitional dilator, which allowed advancement of a Benson wire into the IVC. Over this, an MPA catheter was advanced. A Palindrome 19 hemodialysis catheter was tunneled from the right anterior chest wall approach to the right IJ dermatotomy site. The MPA catheter was exchanged over an Amplatz wire for serial vascular dilators which allow placement of a peel-away sheath, through which the catheter was advanced under intermittent fluoroscopy, positioned with its tips in the proximal and midright atrium. Spot chest radiograph confirms good catheter position. No pneumothorax. Catheter was flushed and primed per protocol. Catheter secured externally with O Prolene sutures. The right IJ dermatotomy site was closed with Dermabond. COMPLICATIONS: COMPLICATIONS None immediate FLUOROSCOPY: Radiation Exposure Index (as provided by the fluoroscopic device): 4 mGy air Kerma COMPARISON:  None IMPRESSION: 1. Technically successful placement of tunneled right IJ hemodialysis catheter  with ultrasound and fluoroscopic guidance. Ready for routine use. ACCESS: Remains approachable for percutaneous intervention as needed. Electronically Signed   By: Lucrezia Europe M.D.   On: 05/03/2021 16:14   DG CHEST PORT 1 VIEW  Result Date: 04/25/2021 CLINICAL DATA:  Placement  of central venous catheter EXAM: PORTABLE CHEST 1 VIEW COMPARISON:  04/23/2021 FINDINGS: Transverse diameter of heart is increased. Central pulmonary vessels are more prominent. Increased interstitial and alveolar markings are seen in the parahilar regions and lower lung fields, more so in the left lower lung fields. There is no pneumothorax. There is interval placement of large caliber right IJ central venous catheter with its tip in superior vena cava. IMPRESSION: Cardiomegaly. Central pulmonary vessels are more prominent suggesting CHF. Tip of right IJ central venous catheter is seen in the superior vena cava. There is no pneumothorax. Electronically Signed   By: Elmer Picker M.D.   On: 04/25/2021 15:56   DG Chest Portable 1 View  Result Date: 04/23/2021 CLINICAL DATA:  81 year old male with dyspnea, atrial fibrillation. EXAM: PORTABLE CHEST - 1 VIEW COMPARISON:  None. FINDINGS: The mediastinal contours are within normal limits. Cardiomegaly. Low lung volumes. Mild bibasilar streaky opacities. No evidence of significant pleural effusion or pneumothorax. No acute osseous abnormality. IMPRESSION: Cardiomegaly with low lung volumes and bibasilar subsegmental atelectasis. Electronically Signed   By: Ruthann Cancer M.D.   On: 04/23/2021 12:35   ECHOCARDIOGRAM COMPLETE  Result Date: 04/24/2021    ECHOCARDIOGRAM REPORT   Patient Name:   Austin Wheeler Date of Exam: 04/24/2021 Medical Rec #:  [id number]      Height:       78.0 in Accession #:    [id number]     Weight:       269.4 lb Date of Birth:  Aug 01, 1940      BSA:          2.562 m Patient Age:    85 years       BP:           101/75 mmHg Patient Gender: M              HR:            68 bpm. Exam Location:  Inpatient Procedure: 2D Echo, Color Doppler and Cardiac Doppler Indications:    S96.28 Acute diastolic (congestive) heart failure  History:        Patient has no prior history of Echocardiogram examinations.                 Risk Factors:Hypertension and Diabetes.  Sonographer:    Raquel Sarna Senior RDCS Referring Phys: [id number]  1. Left ventricular ejection fraction, by estimation, is 55 to 60%. The left ventricle has normal function. The left ventricle has no regional wall motion abnormalities. The left ventricular internal cavity size was moderately dilated. Left ventricular diastolic parameters are indeterminate. There is the interventricular septum is flattened in systole and diastole, consistent with right ventricular pressure and volume overload.  2. Right ventricular systolic function is moderately reduced. The right ventricular size is severely enlarged. There is moderately elevated pulmonary artery systolic pressure.  3. Left atrial size was mildly dilated.  4. Right atrial size was severely dilated.  5. Moderate pleural effusion in the left lateral region.  6. The mitral valve is normal in structure. Mild mitral valve regurgitation. No evidence of mitral stenosis.  7. Tricuspid valve regurgitation is moderate to severe.  8. The aortic valve is tricuspid. Aortic valve regurgitation is trivial. Aortic valve sclerosis is present, with no evidence of aortic valve stenosis.  9. Aortic dilatation noted. There is mild dilatation of the aortic root, measuring 43 mm. There is mild dilatation of the ascending aorta, measuring 43 mm. There is  mild dilatation of the aortic arch, measuring 43 mm. 10. The inferior vena cava is dilated in size with <50% respiratory variability, suggesting right atrial pressure of 15 mmHg. Comparison(s): No prior Echocardiogram. FINDINGS  Left Ventricle: Left ventricular ejection fraction, by estimation, is 55 to 60%. The left ventricle has  normal function. The left ventricle has no regional wall motion abnormalities. The left ventricular internal cavity size was moderately dilated. There is no left ventricular hypertrophy. The interventricular septum is flattened in systole and diastole, consistent with right ventricular pressure and volume overload. Left ventricular diastolic parameters are indeterminate. Right Ventricle: The right ventricular size is severely enlarged. Right ventricular systolic function is moderately reduced. There is moderately elevated pulmonary artery systolic pressure. The tricuspid regurgitant velocity is 2.89 m/s, and with an assumed right atrial pressure of 15 mmHg, the estimated right ventricular systolic pressure is 81.8 mmHg. Left Atrium: Left atrial size was mildly dilated. Right Atrium: Right atrial size was severely dilated. Pericardium: Trivial pericardial effusion is present. Mitral Valve: The mitral valve is normal in structure. Mild mitral valve regurgitation. No evidence of mitral valve stenosis. Tricuspid Valve: The tricuspid valve is normal in structure. Tricuspid valve regurgitation is moderate to severe. No evidence of tricuspid stenosis. Aortic Valve: The aortic valve is tricuspid. Aortic valve regurgitation is trivial. Aortic valve sclerosis is present, with no evidence of aortic valve stenosis. Pulmonic Valve: The pulmonic valve was normal in structure. Pulmonic valve regurgitation is mild. No evidence of pulmonic stenosis. Aorta: Aortic dilatation noted. There is mild dilatation of the aortic root, measuring 43 mm. There is mild dilatation of the ascending aorta, measuring 43 mm. There is mild dilatation of the aortic arch, measuring 43 mm. Venous: The inferior vena cava is dilated in size with less than 50% respiratory variability, suggesting right atrial pressure of 15 mmHg. IAS/Shunts: No atrial level shunt detected by color flow Doppler. Additional Comments: There is a moderate pleural effusion in the  left lateral region.  LEFT VENTRICLE PLAX 2D LVIDd:         6.30 cm LVIDs:         4.60 cm LV PW:         0.90 cm LV IVS:        1.20 cm LVOT diam:     2.10 cm LV SV:         51 LV SV Index:   20 LVOT Area:     3.46 cm  RIGHT VENTRICLE RV S prime:     10.70 cm/s TAPSE (M-mode): 1.9 cm LEFT ATRIUM             Index        RIGHT ATRIUM           Index LA diam:        4.50 cm 1.76 cm/m   RA Area:     39.30 cm LA Vol (A2C):   96.8 ml 37.78 ml/m  RA Volume:   169.00 ml 65.96 ml/m LA Vol (A4C):   94.6 ml 36.92 ml/m LA Biplane Vol: 99.4 ml 38.79 ml/m  AORTIC VALVE LVOT Vmax:   72.97 cm/s LVOT Vmean:  52.700 cm/s LVOT VTI:    0.148 m  AORTA Ao Root diam: 4.30 cm Ao Asc diam:  4.30 cm MITRAL VALVE               TRICUSPID VALVE MV Area (PHT): 3.42 cm    TR Peak grad:   33.4 mmHg MV Decel Time:  222 msec    TR Vmax:        289.00 cm/s MV E velocity: 75.50 cm/s MV A velocity: 26.20 cm/s  SHUNTS MV E/A ratio:  2.88        Systemic VTI:  0.15 m                            Systemic Diam: 2.10 cm Kirk Ruths MD Electronically signed by Kirk Ruths MD Signature Date/Time: 04/24/2021/1:50:43 PM    Final    US BIOPSY (KIDNEY)  Result Date: 05/03/2021 CLINICAL DATA:  Renal insufficiency EXAM: ULTRASOUND GUIDED RENAL CORE BIOPSY COMPARISON:  CT 04/23/2021 TECHNIQUE: Survey ultrasound was performed and an appropriate skin entry site was localized. Site was marked, prepped with Betadine, draped in usual sterile fashion, infiltrated locally with 1% lidocaine. Intravenous Fentanyl 75mg and Versed '1mg'$  were administered as conscious sedation during continuous monitoring of the patient's level of consciousness and physiological / cardiorespiratory status by the radiology RN, with a total moderate sedation time of 10 minutes. Under real time ultrasound guidance, a 15 gauge trocar needle was advanced to the margin of the lower pole of the left kidney for 3 coaxial 16 gauge core biopsy needle passes. The core samples were submitted  to pathology. The patient tolerated procedure well. Complications: None immediate IMPRESSION: 1. Technically successful ultrasound-guided core renal biopsy , left lower pole. Electronically Signed   By: DLucrezia EuropeM.D.   On: 05/03/2021 15:33   IR Paracentesis  Result Date: 04/24/2021 INDICATION: Patient history congestive heart failure, admitted with acute renal and anasarca found to have new onset ascites. Request IR for therapeutic paracentesis EXAM: ULTRASOUND GUIDED THERAPEUTIC PARACENTESIS MEDICATIONS: 7 mL 1% lidocaine COMPLICATIONS: None immediate. PROCEDURE: Informed written consent was obtained from the patient after a discussion of the risks, benefits and alternatives to treatment. A timeout was performed prior to the initiation of the procedure. Initial ultrasound scanning demonstrates a large amount of ascites within the right lower abdominal quadrant. The right lower abdomen was prepped and draped in the usual sterile fashion. 1% lidocaine was used for local anesthesia. Following this, a 19 gauge, 7-cm, Yueh catheter was introduced. An ultrasound image was saved for documentation purposes. The paracentesis was performed. The catheter was removed and a dressing was applied. The patient tolerated the procedure well without immediate post procedural complication. FINDINGS: A total of approximately 8.3 L of clear yellow fluid was removed. IMPRESSION: Successful ultrasound-guided paracentesis yielding 8.3 liters of peritoneal fluid. Read by SCandiss Norse PA-C Electronically Signed   By: GAletta EdouardM.D.   On: 04/24/2021 07:53     Subjective: ***   Discharge Exam: Vitals:   05/10/21 0838 05/10/21 1313  BP: 121/83 108/76  Pulse: 88 93  Resp: 16 18  Temp: 98.1 F (36.7 C) 97.9 F (36.6 C)  SpO2: 98% 98%   Vitals:   05/10/21 0046 05/10/21 0555 05/10/21 0838 05/10/21 1313  BP: 113/75 108/67 121/83 108/76  Pulse: 86 88 88 93  Resp: '11 14 16 18  '$ Temp: 98.5 F (36.9 C) 97.9 F  (36.6 C) 98.1 F (36.7 C) 97.9 F (36.6 C)  TempSrc: Oral Oral Oral Oral  SpO2: 98% 98% 98% 98%  Weight:  106.4 kg    Height:        General: Pt is alert, awake, not in acute distress Cardiovascular: RRR, S1/S2 +, no rubs, no gallops Respiratory: CTA bilaterally, no wheezing, no rhonchi Abdominal: Soft,  NT, ND, bowel sounds + Extremities: no edema, no cyanosis    The results of significant diagnostics from this hospitalization (including imaging, microbiology, ancillary and laboratory) are listed below for reference.     Microbiology: No results found for this or any previous visit (from the past 240 hour(s)).   Labs: BNP (last 3 results) Recent Labs    04/23/21 1137  BNP 7,517.0*   Basic Metabolic Panel: Recent Labs  Lab 05/06/21 0203 05/06/21 1547 05/07/21 0411 05/07/21 1553 05/08/21 0103 05/08/21 1554 05/09/21 0321 05/09/21 1600 05/10/21 0216  NA 132*   < > 132*   < > 132* 132* 134* 133* 134*  K 4.2   < > 3.8   < > 3.9 3.4* 3.6 3.6 3.8  CL 98   < > 95*   < > 97* 96* 97* 97* 97*  CO2 25   < > 27   < > '24 25 29 26 30  '$ GLUCOSE 129*   < > 108*   < > 130* 116* 106* 130* 111*  BUN 34*   < > 24*   < > 33* 19 22 28* 31*  CREATININE 5.53*   < > 4.47*   < > 5.29* 3.67* 4.23* 4.85* 5.24*  CALCIUM 8.4*   < > 8.5*   < > 8.6* 8.5* 8.3* 8.3* 8.7*  MG 2.1  --  2.1  --  2.0  --  2.1  --  2.2  PHOS 2.0*   < > 2.1*   < > 2.1* 1.8* 2.2* 2.5 2.9   < > = values in this interval not displayed.   Liver Function Tests: Recent Labs  Lab 05/08/21 0103 05/08/21 1554 05/09/21 0321 05/09/21 1600 05/10/21 0216  ALBUMIN 2.3* 2.4* 2.4* 2.4* 2.3*   No results for input(s): LIPASE, AMYLASE in the last 168 hours. No results for input(s): AMMONIA in the last 168 hours. CBC: Recent Labs  Lab 05/04/21 0144 05/06/21 0203 05/08/21 0620  WBC 6.1 7.4 7.4  NEUTROABS  --  5.6  --   HGB 9.5* 9.3* 8.9*  HCT 28.0* 27.3* 26.1*  MCV 94.6 95.1 94.9  PLT 164 156 183   Cardiac  Enzymes: No results for input(s): CKTOTAL, CKMB, CKMBINDEX, TROPONINI in the last 168 hours. BNP: Invalid input(s): POCBNP CBG: Recent Labs  Lab 05/05/21 2128 05/06/21 0602 05/06/21 1428 05/09/21 2109 05/10/21 0044  GLUCAP 143* 109* 92 121* 108*   D-Dimer No results for input(s): DDIMER in the last 72 hours. Hgb A1c No results for input(s): HGBA1C in the last 72 hours. Lipid Profile No results for input(s): CHOL, HDL, LDLCALC, TRIG, CHOLHDL, LDLDIRECT in the last 72 hours. Thyroid function studies No results for input(s): TSH, T4TOTAL, T3FREE, THYROIDAB in the last 72 hours.  Invalid input(s): FREET3 Anemia work up No results for input(s): VITAMINB12, FOLATE, FERRITIN, TIBC, IRON, RETICCTPCT in the last 72 hours. Urinalysis    Component Value Date/Time   COLORURINE RED BIOCHEMICALS MAY BE AFFECTED BY COLOR (A) 02/06/2008 1846   APPEARANCEUR TURBID (A) 02/06/2008 1846   LABSPEC 1.013 02/06/2008 1846   PHURINE 5.0 02/06/2008 1846   GLUCOSEU 100 (A) 02/06/2008 1846   HGBUR LARGE (A) 02/06/2008 1846   BILIRUBINUR LARGE (A) 02/06/2008 1846   KETONESUR 40 (A) 02/06/2008 1846   PROTEINUR 100 (A) 02/06/2008 1846   UROBILINOGEN 1.0 02/06/2008 1846   NITRITE POSITIVE (A) 02/06/2008 1846   LEUKOCYTESUR LARGE (A) 02/06/2008 1846   Sepsis Labs Invalid input(s): PROCALCITONIN,  WBC,  [place] [facility] No results found for this or any previous visit (from the past 240 hour(s)).   Time coordinating discharge: Over 30 minutes  SIGNED:   Little Ishikawa, DO [facility] 05/10/2021, 3:42 PM Pager   If 7PM-7AM, please contact night-coverage [email or web address]

## 2021-05-10 NOTE — Progress Notes (Signed)
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   Patient started to have increasing bilateral lower extremities swelling about 3 months ago and was started of po Lasix 40 mg daily, swelling appears to be improving, patient however had episodes of feeling lightheadedness and "too much urination" and decided to cut down Lasix from 40 mg daily to 20 mg daily about 1 month ago. Condition remained stable since until about 1 week ago, patient started to notice swelling in his abdomen and legs, and decreased urine output.  Finally, for the past 3 days, there has been barely any urine came out. He said he feeling "bladder fullness" when having BM, but no urine came out. No abdominal pain, no back pain, no shortness of breath, no fever or chills. No Hx of CHF or stroke  2/24: Admitted to [facility] for acute on chronic renal failure, new onset a. Fib. Nephrology consulted; high dose Lasix trial initiated, large volume paracentesis of 8.3 L 2/26: PCCM consulted. Transfer to ICU for CRRT  2/27: tolerated CRRT 3/1 Heparin drip restarted 2/28 and patient has had recurrent hematuria and bleeding IV site. Heparin drip now on hold  3/6 - Renal biopsy obtained 3/7 - transition to iHD, tolerating well 3/8 -3/10 --> Awaiting transfer to CIR; continue to discuss goals of care with palliative team 3/12 Foley discontinued 3/13 -awaiting insurance approval for transfer to CIR  Assessment and Plan:  Acute Kidney Failure with Anuria requiring RRT - now transitioning to HD Anasarca, resolving - Nephro following - continues on HD - Patient with acute kidney injury of unclear origin. Differential includes ATN versus  nephrotic syndrome versus acute GN given hematuria. 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   - CHADVASC score of 4 (age, HTN, DM), initially holding anticoagulation in setting of gross hematuria.  - 3/7 Heparin drip - discontinued - 3/9 transition to Eliquis 2.5 twice daily given age and GFR   Acute anemia, acute blood loss anemia unlikely chronic anemia of chronic disease.  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 - Continue home ophthalmic meds   Hyponatremia, improving - Likely secondary to volume overload - Continue on dialysis with nephrology  Thrombocytopenia.  - Resolved   Type 2 DM with Hypoglycemia.  - Hypoglycemia resolved.  - CBGs within goal  Hyperkalemia - Resolved with HD  Metabolic acidosis  -Resolved  Gout, history of -Patient complaining of diffuse joint pain today, uric acid low; low risk for acute gout flare   Please note:  Medication Issues - Preferred narcotic agents for pain control are hydromorphone, fentanyl, and methadone. Morphine should not be used.  - Baclofen should be avoided - Avoid oral sodium phosphate and magnesium citrate based laxatives / bowel preps   DVT prophylaxis: Eliquis Code Status:  DNR Family Communication: Wife at bedside Disposition Plan: CIR - pending bed availability and insurance approval Status is: Inpatient Remains inpatient appropriate because: IV treatment, getting HD, CIR  pending  Consultants:  Pccm, nephrology , IR  Subjective: No acute issues or events overnight -patient continues to complain of generalized joint pain and stiffness worse with the left PIP joints in his hand  Objective: Vitals:   05/09/21 1205 05/09/21 2053 05/10/21 0046 05/10/21 0555  BP: 120/73 111/79 113/75 108/67  Pulse: 90 81 86 88  Resp: '18 13 11 14  '$ Temp: 98 F (36.7 C) 98.3 F (36.8 C) 98.5 F (36.9 C) 97.9 F (36.6 C)  TempSrc: Oral Oral Oral Oral  SpO2: 100% 100% 98% 98%  Weight:    106.4 kg  Height:        Intake/Output Summary (Last 24 hours) at 05/10/2021 0725 Last data filed at 05/10/2021 0557 Gross per 24 hour  Intake 370 ml  Output 130 ml  Net 240 ml    Filed Weights   05/08/21 1141 05/09/21 0340 05/10/21 0555  Weight: 108.1 kg 105.4 kg 106.4 kg    Examination: Calm, NAD Clear to auscultate without wheezes or rales Reg s1/s2 no gallop Soft benign +bs mildly distended +1 edema b/l x4 Mood and affect appropriate in current setting   Data Reviewed: I have personally reviewed following labs and imaging studies  CBC: Recent Labs  Lab 05/04/21 0144 05/06/21 0203 05/08/21 0620  WBC 6.1 7.4 7.4  NEUTROABS  --  5.6  --   HGB 9.5* 9.3* 8.9*  HCT 28.0* 27.3* 26.1*  MCV 94.6 95.1 94.9  PLT 164 156 637    Basic Metabolic Panel: Recent Labs  Lab 05/06/21 0203 05/06/21 1547 05/07/21 0411 05/07/21 1553 05/08/21 0103 05/08/21 1554 05/09/21 0321 05/09/21 1600 05/10/21 0216  NA 132*   < > 132*   < > 132* 132* 134* 133* 134*  K 4.2   < > 3.8   < > 3.9 3.4* 3.6 3.6 3.8  CL 98   < > 95*   < > 97* 96* 97* 97* 97*  CO2 25   < > 27   < > '24 25 29 26 30  '$ GLUCOSE 129*   < > 108*   < > 130* 116* 106* 130* 111*  BUN 34*   < > 24*   < > 33* 19 22 28* 31*  CREATININE 5.53*   < > 4.47*   < > 5.29* 3.67* 4.23* 4.85* 5.24*  CALCIUM 8.4*   < > 8.5*   < > 8.6* 8.5* 8.3* 8.3* 8.7*  MG 2.1  --  2.1  --  2.0  --  2.1  --  2.2  PHOS 2.0*   < > 2.1*   < > 2.1*  1.8* 2.2* 2.5 2.9   < > = values in this interval not displayed.    GFR: Estimated Creatinine Clearance: 14.5 mL/min (A) (by C-G formula based on SCr of 5.24 mg/dL (H)). Liver Function Tests: Recent Labs  Lab 05/08/21 0103 05/08/21 1554 05/09/21 0321 05/09/21 1600 05/10/21 0216  ALBUMIN 2.3* 2.4* 2.4* 2.4* 2.3*    No results for input(s): LIPASE, AMYLASE in the last 168 hours. No results for input(s): AMMONIA in the last 168 hours. Coagulation Profile: No results for input(s): INR, PROTIME in the last 168 hours.  Cardiac Enzymes: No results for input(s): CKTOTAL, CKMB, CKMBINDEX, TROPONINI in the last 168 hours.  BNP (last 3 results) No results  for input(s): PROBNP in the last 8760 hours. HbA1C: No results for input(s): HGBA1C in the last 72 hours. CBG: Recent Labs  Lab 05/05/21 2128 05/06/21 0602 05/06/21 1428 05/09/21 2109 05/10/21 0044  GLUCAP 143* 109* 92 121* 108*    Lipid Profile: No results for input(s): CHOL, HDL, LDLCALC, TRIG, CHOLHDL, LDLDIRECT in the last 72 hours. Thyroid Function Tests: No results for input(s): TSH, T4TOTAL, FREET4, T3FREE, THYROIDAB in the last 72 hours. Anemia Panel: No results for input(s): VITAMINB12, FOLATE, FERRITIN, TIBC, IRON, RETICCTPCT in the last 72 hours. Sepsis Labs: No results for input(s): PROCALCITON, LATICACIDVEN in the last 168 hours.   No results found for this or any previous visit (from the past 240 hour(s)).   Radiology Studies: No results found.      Scheduled Meds:  apixaban  2.5 mg Oral BID   aspirin EC  81 mg Oral Daily   atropine  1 drop Right Eye QHS   brimonidine  1 drop Left Eye TID   Chlorhexidine Gluconate Cloth  6 each Topical Q0600   Chlorhexidine Gluconate Cloth  6 each Topical Q0600   dorzolamide-timolol  1 drop Left Eye BID   doxazosin  8 mg Oral Daily   dutasteride  0.5 mg Oral Q M,W,F   feeding supplement  237 mL Oral TID BM   latanoprost  1 drop Left Eye QHS   mouth rinse  15  mL Mouth Rinse BID   multivitamin with minerals  1 tablet Oral Daily   pantoprazole  40 mg Oral Daily   pilocarpine  1 drop Left Eye TID   polyethylene glycol  17 g Oral BID   prednisoLONE acetate  1 drop Right Eye QHS   senna-docusate  1 tablet Oral BID   sodium chloride flush  10-40 mL Intracatheter Q12H   triamcinolone ointment  1 application. Topical Daily   Continuous Infusions:  sodium chloride Stopped (04/28/21 1032)   sodium chloride irrigation Stopped (05/08/21 1814)    Assessment & Plan:   Principal Problem:   AKI (acute kidney injury) ([place]) Active Problems:   Pressure injury of skin   Protein-calorie malnutrition, severe    LOS: 17 days   Time spent: 63mn  Kaye Luoma C Shameria Trimarco, DO [facility]  If 7PM-7AM, please contact night-coverage 05/10/2021, 7:25 AM

## 2021-05-10 NOTE — Progress Notes (Signed)
Mobility Specialist Progress Note ? ? 05/10/21 1500  ?Mobility  ?Activity Transferred from chair to bed  ?Level of Assistance +2 (takes two people)  ?Assistive Device Stedy  ?Activity Response Tolerated well  ?$Mobility charge 1 Mobility  ? ?Pt presenting w/ LUE & LLE pain requesting to get back in bed. Requiring max cues on sequencing, foot placement and hand placement d/t visual deficit. No fault during tx, left in bed w/ call bell by side and wife in the room.  ? ?Holland Falling ?Mobility Specialist ?Phone Number [phone] ? ?

## 2021-05-10 NOTE — Care Management Important Message (Signed)
Important Message ? ?Patient Details  ?Name: Austin Wheeler ?MRN: [id number] ?Date of Birth: 1940-07-06 ? ? ?Medicare Important Message Given:  Yes ? ? ? ? ?Shelda Altes ?05/10/2021, 11:53 AM ?

## 2021-05-10 NOTE — Consult Note (Addendum)
Hospital Consult    Reason for Consult:  permanent access Requesting Physician:  Edrick Oh, MD MRN #:  [id number]  History of Present Illness: This is a 81 y.o. male who presented to ER with decreased urine output, and increased swelling in abdomen and lower extremities.He was found to have worsening renal function requiring CRRT from 2/26 -3/1 and has required HD since then. Renal biopsy showing ATN now likely progressed to ESRD. He has right IJ TDC placed by IR. Vascular surgery has been consulted for placement of permanent access.  No prior central lines or access. No pacemaker. He has new onset afib on Apixaban. He has no upper extremity limitations or prior surgeries. He does report pain and limited mobility in left wrist/hand after IV placement on this admission. He is right hand dominant.   Past Medical History:  Diagnosis Date   Blind    Diabetes mellitus without complication ([place])    Hypertension     Past Surgical History:  Procedure Laterality Date   IR FLUORO GUIDE CV LINE RIGHT  05/03/2021   IR PARACENTESIS  04/23/2021   IR US GUIDE VASC ACCESS RIGHT  05/03/2021    Allergies  Allergen Reactions   Guaifenesin Swelling and Rash    Hand and feet swelling from Robitussin   Influenza Vaccines Other (See Comments)    Caused flu-like illness   Lexapro [Escitalopram] Nausea And Vomiting    Reported by [facility] - pt does not recall    Prior to Admission medications   Medication Sig Start Date End Date Taking? Authorizing Provider  acetaminophen (TYLENOL) 500 MG tablet Take 500 mg by mouth daily as needed for headache (pain).   Yes [provider]  amLODipine (NORVASC) 10 MG tablet Take 10 mg by mouth every morning. 04/11/12  Yes [provider]  aspirin EC 81 MG tablet 81 mg every morning.   Yes [provider]  atropine 1 % ophthalmic solution Place 1 drop into the right eye at bedtime. 11/11/19  Yes [provider]  brimonidine  (ALPHAGAN) 0.2 % ophthalmic solution Place 1 drop into the left eye 3 (three) times daily. 02/22/21  Yes [provider]  cholecalciferol (VITAMIN D3) 25 MCG (1000 UNIT) tablet Take 1,000 Units by mouth 2 (two) times daily.   Yes [provider]  dorzolamide-timolol (COSOPT) 22.3-6.8 MG/ML ophthalmic solution Place 1 drop into the left eye 2 (two) times daily. 04/11/12  Yes [provider]  doxazosin (CARDURA) 8 MG tablet Take 1 tablet (8 mg total) by mouth daily. Patient taking differently: Take 8 mg by mouth every morning. 11/13/20  Yes Stoioff, Ronda Fairly, MD  dutasteride (AVODART) 0.5 MG capsule Take 1 capsule (0.5 mg total) by mouth daily. Patient taking differently: Take 0.5 mg by mouth every Monday, Wednesday, and Friday. 11/13/20  Yes Stoioff, Ronda Fairly, MD  furosemide (LASIX) 40 MG tablet Take 40 mg by mouth every morning. 11/18/20  Yes [provider]  Garlic 194 MG TABS Take 500 mg by mouth every morning.   Yes [provider]  latanoprost (XALATAN) 0.005 % ophthalmic solution Place 1 drop into the left eye at bedtime. 04/11/12  Yes [provider]  metFORMIN (GLUCOPHAGE) 500 MG tablet Take 250 mg by mouth 2 (two) times daily. 03/20/13  Yes [provider]  metoprolol tartrate (LOPRESSOR) 50 MG tablet Take 50 mg by mouth 2 (two) times daily. 05/23/14  Yes [provider]  Multiple Vitamin (MULTIVITAMIN WITH MINERALS) TABS tablet  Take 1 tablet by mouth in the morning.   Yes [provider]  Multiple Vitamins-Minerals (PRESERVISION AREDS 2) CAPS Take 1 capsule by mouth 2 (two) times daily.   Yes [provider]  olmesartan-hydrochlorothiazide (BENICAR HCT) 40-12.5 MG tablet Take 1 tablet by mouth every morning. 07/16/12  Yes [provider]  OVER THE COUNTER MEDICATION Take 1 capsule by mouth See admin instructions. Go Out (OTC) - take one capsule by mouth on Tuesday and Thursday morning (for gout)   Yes  [provider]  pilocarpine (PILOCAR) 4 % ophthalmic solution Place 1 drop into the left eye 3 (three) times daily. 06/20/17  Yes [provider]  potassium chloride SA (KLOR-CON M) 20 MEQ tablet Take 10 mEq by mouth every morning.   Yes [provider]  prednisoLONE acetate (PRED FORTE) 1 % ophthalmic suspension Place 1 drop into the right eye at bedtime. 11/12/19  Yes [provider]  triamcinolone ointment (KENALOG) 0.1 % Apply 1 application topically See admin instructions. Apply topically legs nightly after compression stockings are removed 11/20/20  Yes [provider]  vitamin C (ASCORBIC ACID) 500 MG tablet Take 500 mg by mouth every Saturday.   Yes [provider]  vitamin E 180 MG (400 UNITS) capsule Take 400 Units by mouth every morning.   Yes [provider]    Social History   Socioeconomic History   Marital status: Married    Spouse name: Not on file   Number of children: Not on file   Years of education: Not on file   Highest education level: Not on file  Occupational History   Not on file  Tobacco Use   Smoking status: Never   Smokeless tobacco: Never  Vaping Use   Vaping Use: Never used  Substance and Sexual Activity   Alcohol use: Not Currently   Drug use: Never   Sexual activity: Yes  Other Topics Concern   Not on file  Social History Narrative   Not on file   Social Determinants of Health   Financial Resource Strain: Not on file  Food Insecurity: Not on file  Transportation Needs: Not on file  Physical Activity: Not on file  Stress: Not on file  Social Connections: Not on file  Intimate Partner Violence: Not on file     History reviewed. No pertinent family history.  ROS: Otherwise negative unless mentioned in HPI  Physical Examination  Vitals:   05/10/21 0555 05/10/21 0838  BP: 108/67 121/83  Pulse: 88 88  Resp: 14 16  Temp: 97.9 F (36.6 C) 98.1 F (36.7 C)  SpO2: 98% 98%    Body mass index is 27.11 kg/m.  General:  WDWN in NAD; sitting up in chair; visually impaired Gait: Not observed HENT: WNL, normocephalic Pulmonary: normal non-labored breathing Cardiac: regular; right IJ TDC Abdomen: soft Vascular Exam/Pulses: 2+ bilateral radial and brachial pulses, 3/5 left grip strength, 5/5 right. Hands warm and well perfused. There is ecchymosis and some swelling of left wrist with tenderness to palpation Musculoskeletal: no muscle wasting or atrophy  Neurologic: A&O X 3;  No focal weakness or paresthesias are detected; speech is fluent/normal Psychiatric:  The pt has Normal affect.   CBC    Component Value Date/Time   WBC 7.4 05/08/2021 0620   RBC 2.75 (L) 05/08/2021 0620   HGB 8.9 (L) 05/08/2021 0620   HCT 26.1 (L) 05/08/2021 0620   PLT 183 05/08/2021 0620   MCV 94.9 05/08/2021 2025  MCH 32.4 05/08/2021 0620   MCHC 34.1 05/08/2021 0620   RDW 14.0 05/08/2021 0620   LYMPHSABS 0.6 (L) 05/06/2021 0203   MONOABS 1.1 (H) 05/06/2021 0203   EOSABS 0.1 05/06/2021 0203   BASOSABS 0.0 05/06/2021 0203    BMET    Component Value Date/Time   NA 134 (L) 05/10/2021 0216   NA 144 05/13/2020 1051   K 3.8 05/10/2021 0216   CL 97 (L) 05/10/2021 0216   CO2 30 05/10/2021 0216   GLUCOSE 111 (H) 05/10/2021 0216   BUN 31 (H) 05/10/2021 0216   BUN 11 05/13/2020 1051   CREATININE 5.24 (H) 05/10/2021 0216   CALCIUM 8.7 (L) 05/10/2021 0216   GFRNONAA 10 (L) 05/10/2021 0216   GFRAA  02/06/2008 1915    >60        The eGFR has been calculated using the MDRD equation. This calculation has not been validated in all clinical    COAGS: Lab Results  Component Value Date   INR 1.4 (H) 04/29/2021   INR 1.5 (H) 04/27/2021   INR 1.6 (H) 04/23/2021     Non-Invasive Vascular Imaging:   Bilateral upper extremity vein map pending  Statin:  No. Beta Blocker:  Yes.   Aspirin:  Yes.   ACEI:  No. ARB:  No. CCB use:  No Other antiplatelets/anticoagulants:  Yes.    Apixaban   ASSESSMENT/PLAN: This is a 81 y.o. male with AKI on CKD now in need of permanent dialysis access. Has initiated HD via right IJ TDC on TTS.  - pt is right hand dominant -pending vein mapping, most likely for left arm AVF vs AVG - Discussed fistula vs graft including both risks and benefits with patient and his wife -He will need to be off of his Eliquis for a couple of days prior to surgery or transition to IV Heparin -On call vascular surgeon, Dr. Virl Cagey will see patient to provide further surgical management plans  Karoline Caldwell PA-C [facility] [phone] 05/10/2021  11:01 AM  VASCULAR STAFF ADDENDUM: I have independently interviewed and examined the patient. I agree with the above.  Right-handed male in need of long-term HD access. Please hold Eliquis starting tonight. Will work to schedule in the coming days once vein mapping is completed.   Cassandria Santee, MD [facility] Phone Number: [phone] 05/10/2021 5:22 PM

## 2021-05-10 NOTE — Progress Notes (Signed)
Inpatient Rehabilitation Admissions Coordinator  ? ?I met with patient and his wife at bedside. I await [employer] insurance determination for a possible CIR admit.Acute team made aware. ? ?Danne Baxter, RN, MSN ?Rehab Admissions Coordinator ?[phone] ?05/10/2021 12:19 PM ? ?

## 2021-05-11 ENCOUNTER — Encounter (HOSPITAL_COMMUNITY): Payer: Medicare PPO

## 2021-05-11 DIAGNOSIS — N179 Acute kidney failure, unspecified: Secondary | ICD-10-CM | POA: Diagnosis not present

## 2021-05-11 LAB — RENAL FUNCTION PANEL
Albumin: 2.4 g/dL — ABNORMAL LOW (ref 3.5–5.0)
Anion gap: 12 (ref 5–15)
BUN: 39 mg/dL — ABNORMAL HIGH (ref 8–23)
CO2: 26 mmol/L (ref 22–32)
Calcium: 8.7 mg/dL — ABNORMAL LOW (ref 8.9–10.3)
Chloride: 94 mmol/L — ABNORMAL LOW (ref 98–111)
Creatinine, Ser: 6.21 mg/dL — ABNORMAL HIGH (ref 0.61–1.24)
GFR, Estimated: 9 mL/min — ABNORMAL LOW (ref >60)
Glucose, Bld: 130 mg/dL — ABNORMAL HIGH (ref 70–99)
Phosphorus: 3.1 mg/dL (ref 2.5–4.6)
Potassium: 3.9 mmol/L (ref 3.5–5.1)
Sodium: 132 mmol/L — ABNORMAL LOW (ref 135–145)

## 2021-05-11 LAB — MAGNESIUM: Magnesium: 2.1 mg/dL (ref 1.7–2.4)

## 2021-05-11 NOTE — TOC Progression Note (Signed)
Transition of Care (TOC) - Progression Note  ? ? ?Patient Details  ?Name: Austin Wheeler ?MRN: [id number] ?Date of Birth: [birth year] ? ?Transition of Care (TOC) CM/SW Contact  ?Vinie Sill, LCSW ?Phone Number: ?05/11/2021, 5:10 PM ? ?Clinical Narrative:    ? ?CSW met with patient at bedside and his wife. CSW confirmed they are agreeable to move forward with short term rehab at [facility]. CSW explained limited SNF options because fof the need for dialysis. Spouse and patient states understanding. [facility] is their preferred SNF. ? ?CSW will provide bed offers once available and follow up with [facility].  ?CSW will continue to follow and assist with discharge planning.  ? ?Thurmond Butts, MSW, LCSW ?Clinical Social Worker ? ? ? ?Expected Discharge Plan: [place] ?Barriers to Discharge: Continued Medical Work up ? ?Expected Discharge Plan and Services ?Expected Discharge Plan: [place] ?  ?  ?  ?  ?                ?  ?  ?  ?  ?  ?  ?  ?  ?  ?  ? ? ?Social Determinants of Health (SDOH) Interventions ?  ? ?Readmission Risk Interventions ?No flowsheet data found. ? ?

## 2021-05-11 NOTE — [place] FL2 (Addendum)
?[place] MEDICAID FL2 LEVEL OF CARE SCREENING TOOL  ?  ? ?IDENTIFICATION  ?Patient Name: ?Austin Wheeler Birthdate: 11-27-1940 Sex: male Admission Date (Current Location): ?04/23/2021  ?[place] and [place] Number: ? [place] ?  Facility and Address:  ?[facility] [place], [place] [address] [place], [place] [address] ?     Provider Number: ?[id number]  ?Attending Physician Name and Address:  ?Little Ishikawa, MD ? Relative Name and Phone Number:  ?  ?   ?Current Level of Care: ?Hospital Recommended Level of Care: ?[place] Prior Approval Number: ?  ? ?Date Approved/Denied: ?  PASRR Number: ?[id number] A ? ?Discharge Plan: ?SNF ?  ? ?Current Diagnoses: ?Patient Active Problem List  ? Diagnosis Date Noted  ? Pressure injury of skin 04/26/2021  ? Protein-calorie malnutrition, severe 04/26/2021  ? AKI (acute kidney injury) ([place]) 04/23/2021  ? Diabetes mellitus type 2 without retinopathy ([place]) 05/26/2016  ? BPH with obstruction/lower urinary tract symptoms 05/13/2013  ? Prostate cancer ([place]) 04/11/2012  ? 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 Macular scar, left 03/10/2011  ? Status post intraocular lens implant 03/10/2011  ? Primary open angle glaucoma of both eyes, severe stage 01/17/2011  ? Senile nuclear sclerosis, right 01/17/2011  ? ? ?Orientation RESPIRATION BLADDER Height & Weight   ?  ?Self, Time, Situation, Place ? Normal Continent Weight: 232 lb 5.8 oz (105.4 kg) ?Height:  '6\' 6"'$  (198.1 cm)  ?BEHAVIORAL SYMPTOMS/MOOD NEUROLOGICAL BOWEL NUTRITION STATUS  ?    Continent Diet (please see discharge summary)  ?AMBULATORY STATUS COMMUNICATION OF NEEDS Skin   ?Limited Assist Verbally Surgical wounds (wound/incision anterior;left serous blister on left great tome) ?  ?  ?  ?    ?     ?     ? ? ?Personal Care Assistance Level of Assistance  ?Bathing, Feeding, Dressing Bathing Assistance: Limited assistance ?Feeding assistance: Independent ?Dressing Assistance: Limited assistance ?    ? ?Functional Limitations Info  ?Sight, Hearing, Speech Sight Info: Impaired (blind) ?Hearing Info: Adequate ?Speech Info: Adequate  ? ? ?SPECIAL CARE FACTORS FREQUENCY  ?PT (By licensed PT), OT (By licensed OT)   ?  ?PT Frequency: 5x per week ?OT Frequency: 5x pewr week ?  ?  ?  ?   ? ? ?Contractures Contractures Info: Not present  ? ? ?Additional Factors Info  ?Code Status, Allergies Code Status Info: DNR ?Allergies Info: Guaifenesin,Influenza Vaccines,Lexapro ?  ?  ?  ?   ? ?Current Medications (05/11/2021):  This is the current hospital active medication list ?Current Facility-Administered Medications  ?Medication Dose Route Frequency Provider Last Rate Last Admin  ? 0.9 %  sodium chloride infusion   Intravenous PRN Spero Geralds, MD   Stopped at 04/28/21 1032  ? acetaminophen (TYLENOL) tablet 500 mg  500 mg Oral Daily PRN Erick Colace, NP   500 mg at 05/11/21 1218  ? aspirin EC tablet 81 mg  81 mg Oral Daily Donnamae Jude, RPH   81 mg at 05/11/21 1310  ? atropine 1 % ophthalmic solution 1 drop  1 drop Right Eye QHS Erick Colace, NP   1 drop at 05/10/21 2214  ? bisacodyl (DULCOLAX) suppository 10 mg  10 mg Rectal Daily PRN Knox Royalty, NP   10 mg at 04/28/21 0300  ? brimonidine (ALPHAGAN) 0.2 % ophthalmic solution 1 drop  1 drop Left Eye TID Erick Colace, NP   1 drop at 05/11/21 1607  ?  Chlorhexidine Gluconate Cloth 2 % PADS 6 each  6 each Topical Q0600 Edrick Oh, MD   6 each at 05/11/21 [phone]  ? dorzolamide-timolol (COSOPT) 22.3-6.8 MG/ML ophthalmic solution 1 drop  1 drop Left Eye BID Erick Colace, NP   1 drop at 05/11/21 1313  ? doxazosin (CARDURA) tablet 8 mg  8 mg Oral Daily Little Ishikawa, MD   8 mg at 05/11/21 1310  ? dutasteride (AVODART) capsule 0.5 mg  0.5 mg Oral Q M,W,F Little Ishikawa, MD   0.5 mg at 05/10/21 9628  ? feeding supplement (ENSURE ENLIVE / ENSURE PLUS) liquid 237 mL  237 mL Oral TID BM Little Ishikawa, MD   237 mL at 05/10/21 1319  ?  HYDROcodone-acetaminophen (NORCO/VICODIN) 5-325 MG per tablet 1-2 tablet  1-2 tablet Oral Q4H PRN Arne Cleveland, MD   2 tablet at 05/10/21 1012  ? latanoprost (XALATAN) 0.005 % ophthalmic solution 1 drop  1 drop Left Eye QHS Erick Colace, NP   1 drop at 05/10/21 2216  ? MEDLINE mouth rinse  15 mL Mouth Rinse BID Chesley Mires, MD   15 mL at 05/11/21 1313  ? metoprolol tartrate (LOPRESSOR) injection 2.5 mg  2.5 mg Intravenous Q6H PRN Erick Colace, NP      ? multivitamin with minerals tablet 1 tablet  1 tablet Oral Daily Nolberto Hanlon, MD   1 tablet at 05/11/21 1310  ? oxyCODONE (Oxy IR/ROXICODONE) immediate release tablet 5 mg  5 mg Oral Q6H PRN Spero Geralds, MD   5 mg at 05/09/21 1439  ? pantoprazole (PROTONIX) EC tablet 40 mg  40 mg Oral Daily Erick Colace, NP   40 mg at 05/11/21 1310  ? pilocarpine (PILOCAR) 4 % ophthalmic solution 1 drop  1 drop Left Eye TID Erick Colace, NP   1 drop at 05/11/21 1608  ? polyethylene glycol (MIRALAX / GLYCOLAX) packet 17 g  17 g Oral BID Little Ishikawa, MD   17 g at 05/10/21 2213  ? prednisoLONE acetate (PRED FORTE) 1 % ophthalmic suspension 1 drop  1 drop Right Eye QHS Erick Colace, NP   1 drop at 05/10/21 2214  ? senna-docusate (Senokot-S) tablet 1 tablet  1 tablet Oral BID Little Ishikawa, MD   1 tablet at 05/10/21 2213  ? simethicone (MYLICON) chewable tablet 80 mg  80 mg Oral QID PRN Little Ishikawa, MD      ? sodium chloride flush (NS) 0.9 % injection 10-40 mL  10-40 mL Intracatheter Q12H Spero Geralds, MD   10 mL at 05/11/21 1322  ? sodium chloride flush (NS) 0.9 % injection 10-40 mL  10-40 mL Intracatheter PRN Spero Geralds, MD      ? sodium chloride irrigation 0.9 % 3,000 mL  3,000 mL Irrigation Continuous Little Ishikawa, MD   Held at 05/08/21 1814  ? triamcinolone ointment (KENALOG) 0.1 % 1 application  1 application. Topical Daily Erick Colace, NP   1 application. at 05/10/21 2229  ? ? ? ?Discharge  Medications: ?Please see discharge summary for a list of discharge medications. ? ?Relevant Imaging Results: ? ?Relevant Lab Results: ? ? ?Additional Information ?SSN [id number]  HD - [facility]) TTS 12:30 chair time. pt will need to arrive at 11:30 for first appt. ?Vinie Sill, LCSW ? ? ? ? ?

## 2021-05-11 NOTE — Progress Notes (Signed)
Inpatient Rehabilitation Admissions Coordinator  ? ?I met at bedside with patient , wife and conference called son, Randall Hiss. They are aware that [facility] has denied CIR admit. They wish to pursue SNF at this time. I have alerted acute team and TOC. We will sign off at this time. ? ?Danne Baxter, RN, MSN ?Rehab Admissions Coordinator ?([phone] ?05/11/2021 1:07 PM ? ?

## 2021-05-11 NOTE — Progress Notes (Signed)
?[facility] ?Progress Note  ? ?Subjective:   On HD this AM. No new issues.  Tolerating treatment fine. ? ?Objective ?Vitals:  ? 05/11/21 0819 05/11/21 0826 05/11/21 0830 05/11/21 0900  ?BP: (!) 132/97 94/62 109/70 101/63  ?Pulse: 93 78 86 80  ?Resp: 20     ?Temp:      ?TempSrc: Oral     ?SpO2: 100%     ?Weight: 107.4 kg     ?Height:      ? ?Physical Exam ?General: comfortably in bed ?Heart: RRR, no rub ?Lungs: clear ?Abdomen: soft ?Extremities: 2+ tibial edema = stable c/w yesterday ?Dialysis Access:  RIJ TDC ? ?Additional Objective ?Labs: ?Basic Metabolic Panel: ?Recent Labs  ?Lab 05/09/21 ?1600 05/10/21 ?0216 05/11/21 ?0110  ?NA 133* 134* 132*  ?K 3.6 3.8 3.9  ?CL 97* 97* 94*  ?CO2 '26 30 26  '$ ?GLUCOSE 130* 111* 130*  ?BUN 28* 31* 39*  ?CREATININE 4.85* 5.24* 6.21*  ?CALCIUM 8.3* 8.7* 8.7*  ?PHOS 2.5 2.9 3.1  ? ? ?Liver Function Tests: ?Recent Labs  ?Lab 05/09/21 ?1600 05/10/21 ?0216 05/11/21 ?0110  ?ALBUMIN 2.4* 2.3* 2.4*  ? ? ?No results for input(s): LIPASE, AMYLASE in the last 168 hours. ?CBC: ?Recent Labs  ?Lab 05/06/21 ?0203 05/08/21 ?0620  ?WBC 7.4 7.4  ?NEUTROABS 5.6  --   ?HGB 9.3* 8.9*  ?HCT 27.3* 26.1*  ?MCV 95.1 94.9  ?PLT 156 183  ? ? ?Blood Culture ?No results found for: SDES, [place], [place], REPTSTATUS ? ?Cardiac Enzymes: ?No results for input(s): CKTOTAL, CKMB, CKMBINDEX, TROPONINI in the last 168 hours. ?CBG: ?Recent Labs  ?Lab 05/05/21 ?2128 05/06/21 ?0602 05/06/21 ?1428 05/09/21 ?2109 05/10/21 ?9563  ?GLUCAP 143* 109* 92 121* 108*  ? ? ?Iron Studies: No results for input(s): IRON, TIBC, TRANSFERRIN, FERRITIN in the last 72 hours. ?'@lablastinr3'$ @ ?Studies/Results: ?No results found. ?Medications: ? sodium chloride Stopped (04/28/21 1032)  ? sodium chloride irrigation Stopped (05/08/21 1814)  ? ? apixaban  2.5 mg Oral BID  ? aspirin EC  81 mg Oral Daily  ? atropine  1 drop Right Eye QHS  ? brimonidine  1 drop Left Eye TID  ? Chlorhexidine Gluconate Cloth  6 each Topical Q0600  ?  dorzolamide-timolol  1 drop Left Eye BID  ? doxazosin  8 mg Oral Daily  ? dutasteride  0.5 mg Oral Q M,W,F  ? feeding supplement  237 mL Oral TID BM  ? latanoprost  1 drop Left Eye QHS  ? mouth rinse  15 mL Mouth Rinse BID  ? multivitamin with minerals  1 tablet Oral Daily  ? pantoprazole  40 mg Oral Daily  ? pilocarpine  1 drop Left Eye TID  ? polyethylene glycol  17 g Oral BID  ? prednisoLONE acetate  1 drop Right Eye QHS  ? senna-docusate  1 tablet Oral BID  ? sodium chloride flush  10-40 mL Intracatheter Q12H  ? triamcinolone ointment  1 application. Topical Daily  ? ? 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  Required CRRT 2/26 - 3/1 and has been subsequently requiring iHD since then.  Intradialytic rise in Cr + low UOP suggestive of no recovery.  Vein mapping + VVS consult re: perm access, appreciated - plan access in coming days.  HD TTS, next will be Thurs.   CLIP complete - has outpt arranged for [facility] TTS 12:30 when ready.  ? ?**Anemia:  had some blood  loss with hematuria but this is resolved.  Hb 8.9, appears iron replete. Clarify prostate ca prior to ESA.  Check Hb tomorrow.  ? ?**A fib:  rate controlled on BB, on eliquis ? ?**BMM:  ca and phos ok, PTH pending  ? ?**Hyponatremia:  hypervolemic, improving with HD/UF.  ? ?**DM ?**h/o gout ? ?Will follow, call with concerns.  ?CIR planned if insurance approves.  ? ?Jannifer Hick MD ?05/11/2021, 9:39 AM  ?[facility] ?Pager: [phone] ? ? ?

## 2021-05-11 NOTE — Progress Notes (Signed)
?  Daily Progress Note ? ?ASSESSMENT/PLAN:  ? ?Fistula v AVG Planned for Thursday.  ?Continue to hold Eliquis  ?Final plan pending vein mapping  ? ? ?J. Melene Muller MD MS ?[facility] ?[phone] ?05/11/2021  ?10:33 AM  ?

## 2021-05-11 NOTE — Progress Notes (Signed)
Pt off floor for treatment   

## 2021-05-11 NOTE — Progress Notes (Addendum)
Physical Therapy Treatment ?Patient Details ?Name: Austin Wheeler ?MRN: [id number] ?DOB: [birth year] ?Today's Date: 05/11/2021 ? ? ?History of Present Illness 81 y/o male who presents on 04/23/21 with SOB, weakness and swelling abdomen/LEs. Found to have new onset A-fib, AKI injury of unknown origin started on HD, s/p paracentesis 2/24. Renal biopsy planned fof 05/03/21. PMH includes DM, HTN, legally blind. ? ?  ?PT Comments  ? ? Pt received in supine, agreeable to therapy session, spouse present. Pt limited due to frequent loose stools, therefore emphasis placed on importance of frequent repositioning to protect existing skin breakdown, notifying nursing staff when need for clean-up and self-assist with bed mobility. Pt needing up to modA to roll completely on each side but initiating well, pt limited due to L wrist pain at IV site, RN notified and also that he will need a new foam pad as his was soiled. Defer EOB/OOB mobility due to pt with more bowel movements after initial clean-up and needing more time for BM. Handout printed for supine HEP for strengthening and left in room, spouse receptive to info. Plan to review LE HEP next session. Pt continues to benefit from PT services to progress toward functional mobility goals.    ?Recommendations for follow up therapy are one component of a multi-disciplinary discharge planning process, led by the attending physician.  Recommendations may be updated based on patient status, additional functional criteria and insurance authorization. ? ?Follow Up Recommendations ? Acute inpatient rehab (3hours/day) ?  ?  ?Assistance Recommended at Discharge Frequent or constant Supervision/Assistance  ?Patient can return home with the following Two people to help with walking and/or transfers;Help with stairs or ramp for entrance;Assistance with cooking/housework;Two people to help with bathing/dressing/bathroom ?  ?Equipment Recommendations ? Wheelchair (measurements PT);Wheelchair cushion  (measurements PT);BSC/3in1 (pt is 6'6)  ?  ?Recommendations for Other Services   ? ? ?  ?Precautions / Restrictions Precautions ?Precautions: Fall ?Precaution Comments: legally blind, A-fib, watch HR, bowel/bladder incontinence (helpful to have briefs for OOB) ?Restrictions ?Weight Bearing Restrictions: No  ?  ? ?Mobility ? Bed Mobility ?Overal bed mobility: Needs Assistance ?Bed Mobility: Rolling ?Rolling: Mod assist ?  ?  ?  ?  ?General bed mobility comments: Pt requiring minA to initiate rolling, with hand over hand guidance for locating railing. Once rolled over, pt needs modA to complete roll to reach full side-lying. Rolled x3 for peri-care, but continues to have BM despite clean-up x2 so defers EOB. ?  ? ?Transfers ?  ?  ?  ?  ?  ?  ?  ?  ?  ?General transfer comment: defer due to pt continued loose stools despite clean-up x2 occasions ?  ? ?Ambulation/Gait ?  ?  ?  ?  ?  ?  ?  ?General Gait Details: unable ? ? ?Stairs ?  ?  ?  ?  ?  ? ? ?Wheelchair Mobility ?  ? ?Modified Rankin (Stroke Patients Only) ?  ? ? ?  ?Balance   ?  ?  ?  ?  ?  ?  ?  ?  ?  ?  ?  ?  ?  ?  ?  ?  ?  ?  ?  ? ?  ?Cognition Arousal/Alertness: Awake/alert ?Behavior During Therapy: [facility] for tasks assessed/performed ?Overall Cognitive Status: Within Functional Limits for tasks assessed ?  ?  ?  ?  ?  ?  ?  ?  ?  ?  ?  ?  ?  ?  ?  ?  ?  General Comments: Pt limited due to frequent BMs but unaware of need for BM until he mobilized, needing frequent clean-up. RN/NT notified. ?  ?  ? ?  ?Exercises   ? ?  ?General Comments General comments (skin integrity, edema, etc.): SpO2 sensor not reading when pt using bed side rail, otherwise pt VSS on RA ?  ?  ? ?Pertinent Vitals/Pain Pain Assessment ?Pain Assessment: Faces ?Faces Pain Scale: Hurts even more ?Pain Location: L wrist (IV site), sacral pressure injury/with peri-care ?Pain Descriptors / Indicators: Grimacing, Guarding, Discomfort, Aching ?Pain Intervention(s): Limited activity within patient's  tolerance, Monitored during session, Repositioned  ? ? ?Home Living   ?  ?  ?  ?  ?  ?  ?  ?  ?  ?   ?  ?Prior Function    ?  ?  ?   ? ?PT Goals (current goals can now be found in the care plan section) Acute Rehab PT Goals ?Patient Stated Goal: decrease pain, to move more ?PT Goal Formulation: With patient ?Time For Goal Achievement: 05/14/21 ?Progress towards PT goals: Progressing toward goals (slow progress today) ? ?  ?Frequency ? ? ? Min 3X/week ? ? ? ?  ?PT Plan Current plan remains appropriate  ? ? ?Co-evaluation   ?  ?  ?  ?  ? ?  ?AM-PAC PT "6 Clicks" Mobility   ?Outcome Measure ? Help needed turning from your back to your side while in a flat bed without using bedrails?: A Lot ?Help needed moving from lying on your back to sitting on the side of a flat bed without using bedrails?: A Lot ?Help needed moving to and from a bed to a chair (including a wheelchair)?: A Lot ?Help needed standing up from a chair using your arms (e.g., wheelchair or bedside chair)?: Total ?Help needed to walk in hospital room?: Total ?Help needed climbing 3-5 steps with a railing? : Total ?6 Click Score: 9 ? ?  ?End of Session   ?Activity Tolerance: Treatment limited secondary to medical complications (Comment) (pt limited due to frequent loose stools (cleaned up then had BM again)) ?Patient left: with call bell/phone within reach;in bed;with bed alarm set;with family/visitor present ?Nurse Communication: Mobility status ?PT Visit Diagnosis: Pain;Muscle weakness (generalized) (M62.81);Difficulty in walking, not elsewhere classified (R26.2) ?Pain - Right/Left: Left ?Pain - part of body: Shoulder;Arm;Hand ?  ? ? ?Time: 2355-7322 ?PT Time Calculation (min) (ACUTE ONLY): 24 min ? ?Charges:  $Therapeutic Activity: 8-22 mins          ?          ? ?Kaycie Pegues P., PTA ?Acute Rehabilitation Services ?Pager: [phone] ?Office: [phone]  ? ? ?Mannat Benedetti M Jlyn Bracamonte ?05/11/2021, 4:17 PM ? ?

## 2021-05-12 ENCOUNTER — Inpatient Hospital Stay (HOSPITAL_COMMUNITY): Payer: Medicare PPO

## 2021-05-12 DIAGNOSIS — N186 End stage renal disease: Secondary | ICD-10-CM

## 2021-05-12 DIAGNOSIS — Z992 Dependence on renal dialysis: Secondary | ICD-10-CM

## 2021-05-12 DIAGNOSIS — N179 Acute kidney failure, unspecified: Secondary | ICD-10-CM | POA: Diagnosis not present

## 2021-05-12 LAB — RENAL FUNCTION PANEL
Albumin: 2.4 g/dL — ABNORMAL LOW (ref 3.5–5.0)
Anion gap: 8 (ref 5–15)
BUN: 25 mg/dL — ABNORMAL HIGH (ref 8–23)
CO2: 28 mmol/L (ref 22–32)
Calcium: 8.7 mg/dL — ABNORMAL LOW (ref 8.9–10.3)
Chloride: 97 mmol/L — ABNORMAL LOW (ref 98–111)
Creatinine, Ser: 4.74 mg/dL — ABNORMAL HIGH (ref 0.61–1.24)
GFR, Estimated: 12 mL/min — ABNORMAL LOW (ref >60)
Glucose, Bld: 109 mg/dL — ABNORMAL HIGH (ref 70–99)
Phosphorus: 2.5 mg/dL (ref 2.5–4.6)
Potassium: 4.1 mmol/L (ref 3.5–5.1)
Sodium: 133 mmol/L — ABNORMAL LOW (ref 135–145)

## 2021-05-12 LAB — CBC
HCT: 24.1 % — ABNORMAL LOW (ref 39.0–52.0)
Hemoglobin: 8.1 g/dL — ABNORMAL LOW (ref 13.0–17.0)
MCH: 31.9 pg (ref 26.0–34.0)
MCHC: 33.6 g/dL (ref 30.0–36.0)
MCV: 94.9 fL (ref 80.0–100.0)
Platelets: 246 10*3/uL (ref 150–400)
RBC: 2.54 MIL/uL — ABNORMAL LOW (ref 4.22–5.81)
RDW: 13.7 % (ref 11.5–15.5)
WBC: 5.7 10*3/uL (ref 4.0–10.5)
nRBC: 0 % (ref 0.0–0.2)

## 2021-05-12 LAB — PTH, INTACT AND CALCIUM
Calcium, Total (PTH): 8.8 mg/dL (ref 8.6–10.2)
PTH: 36 pg/mL (ref 15–65)

## 2021-05-12 LAB — MAGNESIUM: Magnesium: 2.3 mg/dL (ref 1.7–2.4)

## 2021-05-12 MED ORDER — HEPATITIS B VAC RECOMBINANT 10 MCG/0.5ML IJ SUSY
2.0000 mL | PREFILLED_SYRINGE | Freq: Once | INTRAMUSCULAR | Status: AC
  Administered 2021-05-12: 2 mL via INTRAMUSCULAR
  Filled 2021-05-12: qty 2

## 2021-05-12 MED ORDER — DARBEPOETIN ALFA 60 MCG/0.3ML IJ SOSY
60.0000 ug | PREFILLED_SYRINGE | INTRAMUSCULAR | Status: DC
  Administered 2021-05-13: 60 ug via INTRAVENOUS
  Filled 2021-05-12: qty 0.3

## 2021-05-12 MED ORDER — CEFAZOLIN SODIUM-DEXTROSE 2-4 GM/100ML-% IV SOLN
2.0000 g | INTRAVENOUS | Status: AC
  Administered 2021-05-13: 2 g via INTRAVENOUS
  Filled 2021-05-12: qty 100

## 2021-05-12 NOTE — Progress Notes (Signed)
Upper extremity vein mapping has been completed.  ? ?Preliminary results in CV Proc.  ? ?Chequita Mofield Drayce Tawil ?05/12/2021 8:50 AM    ?

## 2021-05-12 NOTE — TOC Progression Note (Signed)
Transition of Care (TOC) - Progression Note  ? ? ?Patient Details  ?Name: Austin Wheeler ?MRN: [id number] ?Date of Birth: 1940/10/28 ? ?Transition of Care (TOC) CM/SW Contact  ?Vinie Sill, LCSW ?Phone Number: ?05/12/2021, 4:17 PM ? ?Clinical Narrative:    ? ?CSW met with patient and spouse- CSW informed of bed offers. Spouse has selected [facility]- ? ?CSW sent message to [facility] to confirm bed offer and dialysis transportation- waiting on response. ? ?Thurmond Butts, MSW, LCSW ?Clinical Social Worker ? ? ? ?Expected Discharge Plan: [place] ?Barriers to Discharge: Continued Medical Work up ? ?Expected Discharge Plan and Services ?Expected Discharge Plan: [place] ?  ?  ?  ?  ?                ?  ?  ?  ?  ?  ?  ?  ?  ?  ?  ? ? ?Social Determinants of Health (SDOH) Interventions ?  ? ?Readmission Risk Interventions ?No flowsheet data found. ? ?

## 2021-05-12 NOTE — Progress Notes (Signed)
Occupational Therapy Treatment ?Patient Details ?Name: Austin Wheeler ?MRN: [id number] ?DOB: 1940/12/23 ?Today's Date: 05/12/2021 ? ? ?History of present illness 81 y/o male who presents on 04/23/21 with SOB, weakness and swelling abdomen/LEs. Found to have new onset A-fib, AKI injury of unknown origin started on HD, s/p paracentesis 2/24. Renal biopsy planned fof 05/03/21. PMH includes DM, HTN, legally blind. ?  ?OT comments ? Patient received in bed and agreeable to OT session. Patient was mod assist and increased time to get to EOB with education on rail use.  Patient declined getting into recliner but agreeable to perform standing from EOB.  Patient was max assist to power up from EOB to RW with min-mod assist for balance x2 stands and tolerated over a minute for each stand with increased time to recover between stands. Patient was max assist and increased time to return to supine with increased assistance due to assistance needed with BLEs. Patient discharge recommendations changed to SNF due to need for HD. Acute OT to continue to follow.   ? ?Recommendations for follow up therapy are one component of a multi-disciplinary discharge planning process, led by the attending physician.  Recommendations may be updated based on patient status, additional functional criteria and insurance authorization. ?   ?Follow Up Recommendations ? Skilled nursing-short term rehab (<3 hours/day)  ?  ?Assistance Recommended at Discharge Frequent or constant Supervision/Assistance  ?Patient can return home with the following ? Two people to help with walking and/or transfers;Two people to help with bathing/dressing/bathroom;Assistance with cooking/housework;Help with stairs or ramp for entrance;Assist for transportation;Direct supervision/assist for financial management;Direct supervision/assist for medications management ?  ?Equipment Recommendations ? Wheelchair (measurements OT);Wheelchair cushion (measurements OT);Tub/shower  seat;BSC/3in1;Hospital bed  ?  ?Recommendations for Other Services   ? ?  ?Precautions / Restrictions Precautions ?Precautions: Fall ?Precaution Comments: legally blind, A-fib, watch HR, bowel/bladder incontinence (helpful to have briefs for OOB) ?Restrictions ?Weight Bearing Restrictions: No  ? ? ?  ? ?Mobility Bed Mobility ?Overal bed mobility: Needs Assistance ?Bed Mobility: Supine to Sit, Sit to Supine ?  ?  ?Supine to sit: Mod assist ?Sit to supine: Max assist ?  ?General bed mobility comments: increased time and instructions on rail use to get to EOB and supine with increased assistance to get to supine due to assisance needed with BLEs ?  ? ?Transfers ?Overall transfer level: Needs assistance ?Equipment used: Rolling walker (2 wheels) ?Transfers: Sit to/from Stand ?Sit to Stand: Max assist ?  ?  ?  ?  ?  ?General transfer comment: Standing performed from EOB x2 with max assist to power up to RW ?  ?  ?Balance Overall balance assessment: Needs assistance ?Sitting-balance support: Feet supported, No upper extremity supported ?Sitting balance-Leahy Scale: Fair ?Sitting balance - Comments: required UE support initally but progressed to sitting with feet supported only ?  ?Standing balance support: Bilateral upper extremity supported ?Standing balance-Leahy Scale: Poor ?Standing balance comment: reliant on RW for support while standing with min-mod assist for balance ?  ?  ?  ?  ?  ?  ?  ?  ?  ?  ?  ?   ? ?ADL either performed or assessed with clinical judgement  ? ?ADL   ?  ?  ?  ?  ?  ?  ?  ?  ?  ?  ?  ?  ?  ?  ?  ?  ?  ?  ?  ?  ?  ? ?Extremity/Trunk Assessment Upper Extremity  Assessment ?RUE Deficits / Details: pain to all pivots and increased edema to elbow ?RUE Sensation: WNL ?LUE Deficits / Details: pain to all pivots and increased edema to elbow ?LUE Sensation: WNL ?  ?  ?  ?  ?  ? ?Vision   ?  ?  ?Perception   ?  ?Praxis   ?  ? ?Cognition Arousal/Alertness: Awake/alert ?Behavior During Therapy: [facility] for  tasks assessed/performed ?Overall Cognitive Status: Within Functional Limits for tasks assessed ?  ?  ?  ?  ?  ?  ?  ?  ?  ?  ?  ?  ?  ?  ?  ?  ?General Comments: Patient states he feels overwhelmed with information ?  ?  ?   ?Exercises   ? ?  ?Shoulder Instructions   ? ? ?  ?General Comments    ? ? ?Pertinent Vitals/ Pain       Pain Assessment ?Pain Assessment: Faces ?Faces Pain Scale: Hurts little more ?Pain Location: L wrist (IV site), sacral pressure ?Pain Descriptors / Indicators: Grimacing, Guarding, Discomfort, Aching ?Pain Intervention(s): Monitored during session, Limited activity within patient's tolerance, Repositioned ? ?Home Living   ?  ?  ?  ?  ?  ?  ?  ?  ?  ?  ?  ?  ?  ?  ?  ?  ?  ?  ? ?  ?Prior Functioning/Environment    ?  ?  ?  ?   ? ?Frequency ? Min 2X/week  ? ? ? ? ?  ?Progress Toward Goals ? ?OT Goals(current goals can now be found in the care plan section) ? Progress towards OT goals: Progressing toward goals ? ?Acute Rehab OT Goals ?Patient Stated Goal: get better ?OT Goal Formulation: With patient ?Time For Goal Achievement: 05/14/21 ?Potential to Achieve Goals: Fair ?ADL Goals ?Pt Will Perform Grooming: with set-up;sitting ?Pt Will Perform Upper Body Bathing: with set-up;sitting ?Pt Will Perform Upper Body Dressing: with set-up;sitting ?Pt Will Transfer to Toilet: with mod assist;stand pivot transfer;bedside commode ?Pt/caregiver will Perform Home Exercise Program: Increased strength;Both right and left upper extremity;With minimal assist;With written HEP provided  ?Plan Discharge plan remains appropriate   ? ?Co-evaluation ? ? ?   ?  ?  ?  ?  ? ?  ?AM-PAC OT "6 Clicks" Daily Activity     ?Outcome Measure ? ? Help from another person eating meals?: A Little ?Help from another person taking care of personal grooming?: A Little ?Help from another person toileting, which includes using toliet, bedpan, or urinal?: A Lot ?Help from another person bathing (including washing, rinsing, drying)?: A  Lot ?Help from another person to put on and taking off regular upper body clothing?: A Lot ?Help from another person to put on and taking off regular lower body clothing?: Total ?6 Click Score: 13 ? ?  ?End of Session Equipment Utilized During Treatment: Gait belt;Rolling walker (2 wheels) ? ?OT Visit Diagnosis: Unsteadiness on feet (R26.81);Muscle weakness (generalized) (M62.81);Pain ?Pain - Right/Left: Left ?Pain - part of body: Hand ?  ?Activity Tolerance Patient tolerated treatment well ?  ?Patient Left in bed;with call bell/phone within reach;with bed alarm set ?  ?Nurse Communication Mobility status ?  ? ?   ? ?Time: 2778-2423 ?OT Time Calculation (min): 30 min ? ?Charges: OT General Charges ?$OT Visit: 1 Visit ?OT Treatments ?$Therapeutic Activity: 23-37 mins ? ?Lodema Hong, OTA ?Acute Rehabilitation Services  ?Pager [phone] ?Office [phone] ? ? ?[place] ?  05/12/2021, 3:09 PM ?

## 2021-05-12 NOTE — Progress Notes (Signed)
?PROGRESS NOTE ? ? ? ?Austin Wheeler  YHC:[id number] DOB: 09-18-1940 DOA: 04/23/2021 ?PCP: Josetta Huddle, MD  ? ? 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 ?  ?Patient started to have increasing bilateral lower extremities swelling about 3 months ago and was started of po Lasix 40 mg daily, swelling appears to be improving, patient however had episodes of feeling lightheadedness and "too much urination" and decided to cut down Lasix from 40 mg daily to 20 mg daily about 1 month ago. Condition remained stable since until about 1 week ago, patient started to notice swelling in his abdomen and legs, and decreased urine output.  Finally, for the past 3 days, there has been barely any urine came out. He said he feeling "bladder fullness" when having BM, but no urine came out. No abdominal pain, no back pain, no shortness of breath, no fever or chills. No Hx of CHF or stroke ?  ?2/24: Admitted to [facility] for acute on chronic renal failure, new onset a. Fib. Nephrology consulted; high dose Lasix trial initiated, large volume paracentesis of 8.3 L ?2/26: PCCM consulted. Transfer to ICU for CRRT  ?2/27: tolerated CRRT ?3/1 Heparin drip restarted 2/28 and patient has had recurrent hematuria and bleeding IV site. Heparin drip now on hold  ?3/6 - Renal biopsy obtained ?3/7 - transition to iHD, tolerating well ?3/8 -3/10 --> Awaiting transfer to CIR; continue to discuss goals of care with palliative team ?3/12 Foley discontinued ?3/15 transfer to snf ? ? ? ?Consultants:  ?Nephrology ?Procedures:  ? ?Antimicrobials:  ?  ? ? ?Subjective: ?Has no complaints. Denies sob, cp, abd pain ? ?Objective: ?Vitals:  ? 05/11/21 1949 05/11/21 2245 05/12/21 0440 05/12/21 0738  ?BP: 100/64 113/69 113/71 119/69  ?Pulse: 88 87 87 95  ?Resp: '15 18 19  16  '$ ?Temp:  98.3 ?F (36.8 ?C) 98 ?F (36.7 ?C) 97.7 ?F (36.5 ?C)  ?TempSrc:  Oral Oral Oral  ?SpO2: 100% 100% 100% 100%  ?Weight:   105.4 kg   ?Height:      ? ? ?Intake/Output Summary (Last 24 hours) at 05/12/2021 1420 ?Last data filed at 05/11/2021 1919 ?Gross per 24 hour  ?Intake 300 ml  ?Output --  ?Net 300 ml  ? ?Filed Weights  ? 05/11/21 0819 05/11/21 1215 05/12/21 0440  ?Weight: 107.4 kg 105.4 kg 105.4 kg  ? ? ?Examination: ?Calm, NAD ?Cta no w/r ?Reg s1/s2 no gallop ?Soft benign +bs ?No edema ?Aaoxox3  ?Mood and affect appropriate in current setting  ? ? ? ?Data Reviewed: I have personally reviewed following labs and imaging studies ? ?CBC: ?Recent Labs  ?Lab 05/06/21 ?0203 05/08/21 ?0620 05/12/21 ?0305  ?WBC 7.4 7.4 5.7  ?NEUTROABS 5.6  --   --   ?HGB 9.3* 8.9* 8.1*  ?HCT 27.3* 26.1* 24.1*  ?MCV 95.1 94.9 94.9  ?PLT 156 183 246  ? ?Basic Metabolic Panel: ?Recent Labs  ?Lab 05/08/21 ?0103 05/08/21 ?1554 05/09/21 ?0321 05/09/21 ?1600 05/10/21 ?0216 05/11/21 ?0110 05/12/21 ?0305  ?NA 132*   < > 134* 133* 134* 132* 133*  ?K 3.9   < > 3.6 3.6 3.8 3.9 4.1  ?CL 97*   < > 97* 97* 97* 94* 97*  ?CO2 24   < > '29 26 30 26 28  '$ ?GLUCOSE 130*   < >  106* 130* 111* 130* 109*  ?BUN 33*   < > 22 28* 31* 39* 25*  ?CREATININE 5.29*   < > 4.23* 4.85* 5.24* 6.21* 4.74*  ?CALCIUM 8.6*   < > 8.3* 8.3* 8.7* 8.7* 8.7*  ?MG 2.0  --  2.1  --  2.2 2.1 2.3  ?PHOS 2.1*   < > 2.2* 2.5 2.9 3.1 2.5  ? < > = values in this interval not displayed.  ? ?GFR: ?Estimated Creatinine Clearance: 16.1 mL/min (A) (by C-G formula based on SCr of 4.74 mg/dL (H)). ?Liver Function Tests: ?Recent Labs  ?Lab 05/09/21 ?0321 05/09/21 ?1600 05/10/21 ?0216 05/11/21 ?0110 05/12/21 ?0017  ?ALBUMIN 2.4* 2.4* 2.3* 2.4* 2.4*  ? ?No results for input(s): LIPASE, AMYLASE in the last 168 hours. ?No results for input(s): AMMONIA in the last 168 hours. ?Coagulation Profile: ?No results for input(s): INR, PROTIME in the last 168 hours. ?Cardiac Enzymes: ?No results for  input(s): CKTOTAL, CKMB, CKMBINDEX, TROPONINI in the last 168 hours. ?BNP (last 3 results) ?No results for input(s): PROBNP in the last 8760 hours. ?HbA1C: ?No results for input(s): HGBA1C in the last 72 hours. ?CBG: ?Recent Labs  ?Lab 05/05/21 ?2128 05/06/21 ?0602 05/06/21 ?1428 05/09/21 ?2109 05/10/21 ?4944  ?GLUCAP 143* 109* 92 121* 108*  ? ?Lipid Profile: ?No results for input(s): CHOL, HDL, LDLCALC, TRIG, CHOLHDL, LDLDIRECT in the last 72 hours. ?Thyroid Function Tests: ?No results for input(s): TSH, T4TOTAL, FREET4, T3FREE, THYROIDAB in the last 72 hours. ?Anemia Panel: ?No results for input(s): VITAMINB12, FOLATE, FERRITIN, TIBC, IRON, RETICCTPCT in the last 72 hours. ?Sepsis Labs: ?No results for input(s): PROCALCITON, LATICACIDVEN in the last 168 hours. ? ?No results found for this or any previous visit (from the past 240 hour(s)).  ? ? ? ? ? ?Radiology Studies: ?VAS [place] UPPER EXT VEIN MAPPING (PRE-OP AVF) ? ?Result Date: 05/12/2021 ?[place] MAPPING Patient Name:  Austin Wheeler  Date of Exam:   05/12/2021 Medical Rec #: [id number]       Accession #:    [id number] Date of Birth: [birth year]       Patient Gender: M Patient Age:   81 years Exam Location:  [facility] Procedure:      VAS [place] UPPER EXT VEIN MAPPING (PRE-OP AVF) Referring Phys: Jannifer Hick --------------------------------------------------------------------------------  Indications: Pre-access. Comparison Study: no prior Performing Technologist: Archie Patten RVS  Examination Guidelines: A complete evaluation includes B-mode imaging, spectral Doppler, color Doppler, and power Doppler as needed of all accessible portions of each vessel. Bilateral testing is considered an integral part of a complete examination. Limited examinations for reoccurring indications may be performed as noted. +-----------------+-------------+----------+---------+ Right Cephalic   Diameter (cm)Depth (cm)Findings   +-----------------+-------------+----------+---------+ Shoulder             0.38        0.67             +-----------------+-------------+----------+---------+ Prox upper arm       0.33        0.48             +-----------------+-------------+----------+---------+ Mid upper arm        0.29        0.32             +-----------------+-------------+----------+---------+ Dist upper arm       0.29        0.31             +-----------------+-------------+----------+---------+ Antecubital fossa  0.47        0.24             +-----------------+-------------+----------+---------+ Prox forearm         0.31        0.47   branching +-----------------+-------------+----------+---------+ Mid forearm          0.45        0.52             +-----------------+-------------+----------+---------+ Dist forearm         0.30        0.36             +-----------------+-------------+----------+---------+ Wrist                0.20        0.43             +-----------------+-------------+----------+---------+ +-----------------+-------------+----------+---------+ Right Basilic    Diameter (cm)Depth (cm)Findings  +-----------------+-------------+----------+---------+ Prox upper arm       0.37        1.05             +-----------------+-------------+----------+---------+ Mid upper arm        0.36        0.63             +-----------------+-------------+----------+---------+ Dist upper arm       0.36        0.78             +-----------------+-------------+----------+---------+ Antecubital fossa    0.34        1.17   branching +-----------------+-------------+----------+---------+ Prox forearm         0.21        0.47             +-----------------+-------------+----------+---------+ Mid forearm          0.23        0.55             +-----------------+-------------+----------+---------+ Distal forearm       0.28        0.40              +-----------------+-------------+----------+---------+ Wrist                0.20        0.30             +-----------------+-------------+----------+---------+ +-----------------+-------------+----------+--------+ Left Cephalic    Diameter (cm)Depth (cm)Findings +-----------------+-------------+----------+--------+ Shoulder             0.22        0.40

## 2021-05-12 NOTE — TOC Progression Note (Signed)
Transition of Care (TOC) - Progression Note  ? ? ?Patient Details  ?Name: Austin Wheeler ?MRN: [id number] ?Date of Birth: [birth year] ? ?Transition of Care (TOC) CM/SW Contact  ?Vinie Sill, LCSW ?Phone Number: ?05/12/2021, 3:47 PM ? ?Clinical Narrative:    ? ?CSW called patient's spouse 12:30 pm- no answer  ? ?3:39 pm - no answer, left voice message of SNF bed offers.  ? ? ?Thurmond Butts, MSW, LCSW ?Clinical Social Worker ? ? ?Expected Discharge Plan: [place] ?Barriers to Discharge: Continued Medical Work up ? ?Expected Discharge Plan and Services ?Expected Discharge Plan: [place] ?  ?  ?  ?  ?                ?  ?  ?  ?  ?  ?  ?  ?  ?  ?  ? ? ?Social Determinants of Health (SDOH) Interventions ?  ? ?Readmission Risk Interventions ?No flowsheet data found. ? ?

## 2021-05-12 NOTE — Progress Notes (Signed)
?[facility] ?Progress Note  ? ?Subjective:   Tolerated HD with UF 3L yesterday.  No new issues.   ? ?Objective ?Vitals:  ? 05/11/21 1949 05/11/21 2245 05/12/21 0440 05/12/21 0738  ?BP: 100/64 113/69 113/71 119/69  ?Pulse: 88 87 87 95  ?Resp: '15 18 19 16  '$ ?Temp:  98.3 ?F (36.8 ?C) 98 ?F (36.7 ?C) 97.7 ?F (36.5 ?C)  ?TempSrc:  Oral Oral Oral  ?SpO2: 100% 100% 100% 100%  ?Weight:   105.4 kg   ?Height:      ? ?Physical Exam ?General: comfortably in bed ?Heart: RRR, no rub ?Lungs: clear ?Abdomen: soft ?Extremities: 1+ tibial edema = improved  ?Dialysis Access:  RIJ TDC ? ?Additional Objective ?Labs: ?Basic Metabolic Panel: ?Recent Labs  ?Lab 05/10/21 ?0216 05/11/21 ?0110 05/12/21 ?6606  ?NA 134* 132* 133*  ?K 3.8 3.9 4.1  ?CL 97* 94* 97*  ?CO2 '30 26 28  '$ ?GLUCOSE 111* 130* 109*  ?BUN 31* 39* 25*  ?CREATININE 5.24* 6.21* 4.74*  ?CALCIUM 8.7* 8.7* 8.7*  ?PHOS 2.9 3.1 2.5  ? ? ?Liver Function Tests: ?Recent Labs  ?Lab 05/10/21 ?0216 05/11/21 ?0110 05/12/21 ?0045  ?ALBUMIN 2.3* 2.4* 2.4*  ? ? ?No results for input(s): LIPASE, AMYLASE in the last 168 hours. ?CBC: ?Recent Labs  ?Lab 05/06/21 ?0203 05/08/21 ?0620 05/12/21 ?0305  ?WBC 7.4 7.4 5.7  ?NEUTROABS 5.6  --   --   ?HGB 9.3* 8.9* 8.1*  ?HCT 27.3* 26.1* 24.1*  ?MCV 95.1 94.9 94.9  ?PLT 156 183 246  ? ? ?Blood Culture ?No results found for: SDES, [place], [place], REPTSTATUS ? ?Cardiac Enzymes: ?No results for input(s): CKTOTAL, CKMB, CKMBINDEX, TROPONINI in the last 168 hours. ?CBG: ?Recent Labs  ?Lab 05/05/21 ?2128 05/06/21 ?0602 05/06/21 ?1428 05/09/21 ?2109 05/10/21 ?9977  ?GLUCAP 143* 109* 92 121* 108*  ? ? ?Iron Studies: No results for input(s): IRON, TIBC, TRANSFERRIN, FERRITIN in the last 72 hours. ?'@lablastinr3'$ @ ?Studies/Results: ?VAS [place] UPPER EXT VEIN MAPPING (PRE-OP AVF) ? ?Result Date: 05/12/2021 ?[place] MAPPING Patient Name:  Austin Wheeler  Date of Exam:   05/12/2021 Medical Rec #: [id number]       Accession #:    [id number] Date  of Birth: [birth year]       Patient Gender: M Patient Age:   81 years Exam [facility]  [facility] Procedure:      VAS [place] UPPER EXT VEIN MAPPING (PRE-OP AVF) Referring Phys: Jannifer Hick --------------------------------------------------------------------------------  Indications: Pre-access. Comparison Study: no prior Performing Technologist: Archie Patten RVS  Examination Guidelines: A complete evaluation includes B-mode imaging, spectral Doppler, color Doppler, and power Doppler as needed of all accessible portions of each vessel. Bilateral testing is considered an integral part of a complete examination. Limited examinations for reoccurring indications may be performed as noted. +-----------------+-------------+----------+---------+ Right Cephalic   Diameter (cm)Depth (cm)Findings  +-----------------+-------------+----------+---------+ Shoulder             0.38        0.67             +-----------------+-------------+----------+---------+ Prox upper arm       0.33        0.48             +-----------------+-------------+----------+---------+ Mid upper arm        0.29        0.32             +-----------------+-------------+----------+---------+ Dist upper arm       0.29  0.31             +-----------------+-------------+----------+---------+ Antecubital fossa    0.47        0.24             +-----------------+-------------+----------+---------+ Prox forearm         0.31        0.47   branching +-----------------+-------------+----------+---------+ Mid forearm          0.45        0.52             +-----------------+-------------+----------+---------+ Dist forearm         0.30        0.36             +-----------------+-------------+----------+---------+ Wrist                0.20        0.43             +-----------------+-------------+----------+---------+ +-----------------+-------------+----------+---------+ Right Basilic    Diameter (cm)Depth  (cm)Findings  +-----------------+-------------+----------+---------+ Prox upper arm       0.37        1.05             +-----------------+-------------+----------+---------+ Mid upper arm        0.36        0.63             +-----------------+-------------+----------+---------+ Dist upper arm       0.36        0.78             +-----------------+-------------+----------+---------+ Antecubital fossa    0.34        1.17   branching +-----------------+-------------+----------+---------+ Prox forearm         0.21        0.47             +-----------------+-------------+----------+---------+ Mid forearm          0.23        0.55             +-----------------+-------------+----------+---------+ Distal forearm       0.28        0.40             +-----------------+-------------+----------+---------+ Wrist                0.20        0.30             +-----------------+-------------+----------+---------+ +-----------------+-------------+----------+--------+ Left Cephalic    Diameter (cm)Depth (cm)Findings +-----------------+-------------+----------+--------+ Shoulder             0.22        0.40            +-----------------+-------------+----------+--------+ Prox upper arm       0.20        0.37            +-----------------+-------------+----------+--------+ Mid upper arm        0.16        0.56            +-----------------+-------------+----------+--------+ Dist upper arm       0.30        0.34            +-----------------+-------------+----------+--------+ Antecubital fossa    0.24        0.32            +-----------------+-------------+----------+--------+ Prox forearm         0.33  0.54            +-----------------+-------------+----------+--------+ Mid forearm          0.31        0.39            +-----------------+-------------+----------+--------+ Dist forearm         0.26        0.31             +-----------------+-------------+----------+--------+ Wrist                0.28        0.26            +-----------------+-------------+----------+--------+ +-----------------+-------------+----------+---------+ Left Basilic     Diameter (cm)Depth (cm)Findings  +-----------------+-------------+----------+---------+ Prox upper arm       0.30        0.80             +-----------------+-------------+----------+---------+ Mid upper arm        0.31        0.70             +-----------------+-------------+----------+---------+ Dist upper arm       0.44        0.56             +-----------------+-------------+----------+---------+ Antecubital fossa    0.26        0.42   branching +-----------------+-------------+----------+---------+ Prox forearm         0.22        0.33             +-----------------+-------------+----------+---------+ Mid forearm          0.20        0.27             +-----------------+-------------+----------+---------+ Distal forearm       0.16        0.28             +-----------------+-------------+----------+---------+ *See table(s) above for measurements and observations.  Diagnosing physician:    Preliminary    ?Medications: ? sodium chloride Stopped (04/28/21 1032)  ? [START ON 05/13/2021]  ceFAZolin (ANCEF) IV    ? sodium chloride irrigation Stopped (05/08/21 1814)  ? ? aspirin EC  81 mg Oral Daily  ? atropine  1 drop Right Eye QHS  ? brimonidine  1 drop Left Eye TID  ? Chlorhexidine Gluconate Cloth  6 each Topical Q0600  ? dorzolamide-timolol  1 drop Left Eye BID  ? doxazosin  8 mg Oral Daily  ? dutasteride  0.5 mg Oral Q M,W,F  ? feeding supplement  237 mL Oral TID BM  ? latanoprost  1 drop Left Eye QHS  ? mouth rinse  15 mL Mouth Rinse BID  ? multivitamin with minerals  1 tablet Oral Daily  ? pantoprazole  40 mg Oral Daily  ? pilocarpine  1 drop Left Eye TID  ? polyethylene glycol  17 g Oral BID  ? prednisoLONE acetate  1 drop Right Eye QHS  ?  senna-docusate  1 tablet Oral BID  ? sodium chloride flush  10-40 mL Intracatheter Q12H  ? triamcinolone ointment  1 application. Topical Daily  ? ? 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  Required CRRT 2/26 - 3/1 and has been subsequently requiring iHD since then.  Intradialytic rise in Cr + low UOP suggestive of no recovery.  Vein mapping + VVS cons

## 2021-05-12 NOTE — Progress Notes (Addendum)
Provided service recovery on patient. 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 Patient verbalized understanding. All questions have been answered. Attending MD and Infection Disease MD have been notified and will continue to follow patient. Initial vaccine scheduled today. Then he will need repeat testing every month at HD ? ?Dorthey Sawyer, RN  ?Dialysis Nurse Coordinator ?[phone] ?

## 2021-05-12 NOTE — Progress Notes (Addendum)
?  Daily Progress Note ? ? ?Subjective: ?Continues to complain of left hand pain from prior IV stick. Grip strength asymmetric.  ? ?Objective: ?Vitals:  ? 05/12/21 0440 05/12/21 0738  ?BP: 113/71 119/69  ?Pulse: 87 95  ?Resp: 19 16  ?Temp: 98 ?F (36.7 ?C) 97.7 ?F (36.5 ?C)  ?SpO2: 100% 100%  ?  ?Physical Examination ?Palpable radial pulses bilaterally ?Asymmetric grip strength, pain in the right hand, weak grip due to previous IV on the dorsal aspect of the hand. ?Normal rate ?Nonlabored breathing ? ?ASSESSMENT/PLAN:  ?Patient is an 81 year old male in need for long-term hemodialysis access. ?Vein mapping has been completed, and reviewed. ?Elizabeth has adequate vein for right-sided brachiocephalic fistula creation. ?I had an extensive discussion with this patient in regards to the nature of access surgery, including risk, benefits, and alternatives.   ?The patient is aware that the risks of access surgery include but are not limited to: bleeding, infection, steal syndrome, nerve damage, ischemic monomelic neuropathy, failure of access to mature, complications related to venous hypertension, and possible need for additional access procedures in the future. ?I discussed with the patient the nature of the staged access procedure, specifically the need for a second operation to transpose the first stage fistula if it matures adequately.   ? ? ?NPO midnight, fistula planned for tomorrow  ? ?J. Melene Muller MD MS ?[facility] ?[phone] ?05/12/2021  ?9:08 AM ? ?

## 2021-05-13 ENCOUNTER — Encounter (HOSPITAL_COMMUNITY)
Admission: EM | Disposition: A | Discharge status: Skilled Nursing Facility | Payer: Self-pay | Source: Home / Self Care | Attending: Internal Medicine

## 2021-05-13 ENCOUNTER — Other Ambulatory Visit: Payer: Self-pay

## 2021-05-13 ENCOUNTER — Inpatient Hospital Stay (HOSPITAL_COMMUNITY): Payer: Medicare PPO | Admitting: Certified Registered Nurse Anesthetist

## 2021-05-13 ENCOUNTER — Encounter (HOSPITAL_COMMUNITY): Payer: Self-pay | Admitting: Internal Medicine

## 2021-05-13 DIAGNOSIS — R531 Weakness: Secondary | ICD-10-CM | POA: Diagnosis not present

## 2021-05-13 DIAGNOSIS — H547 Unspecified visual loss: Secondary | ICD-10-CM | POA: Diagnosis not present

## 2021-05-13 DIAGNOSIS — N179 Acute kidney failure, unspecified: Secondary | ICD-10-CM | POA: Diagnosis not present

## 2021-05-13 DIAGNOSIS — Z992 Dependence on renal dialysis: Secondary | ICD-10-CM

## 2021-05-13 DIAGNOSIS — N186 End stage renal disease: Secondary | ICD-10-CM | POA: Diagnosis not present

## 2021-05-13 DIAGNOSIS — E1122 Type 2 diabetes mellitus with diabetic chronic kidney disease: Secondary | ICD-10-CM

## 2021-05-13 DIAGNOSIS — I120 Hypertensive chronic kidney disease with stage 5 chronic kidney disease or end stage renal disease: Secondary | ICD-10-CM

## 2021-05-13 DIAGNOSIS — D631 Anemia in chronic kidney disease: Secondary | ICD-10-CM

## 2021-05-13 HISTORY — PX: AV FISTULA PLACEMENT: SHX1204

## 2021-05-13 LAB — RESP PANEL BY RT-PCR (FLU A&B, COVID) ARPGX2
Influenza A by PCR: NEGATIVE
Influenza B by PCR: NEGATIVE
SARS Coronavirus 2 by RT PCR: NEGATIVE

## 2021-05-13 LAB — RENAL FUNCTION PANEL
Albumin: 2.5 g/dL — ABNORMAL LOW (ref 3.5–5.0)
Anion gap: 10 (ref 5–15)
BUN: 33 mg/dL — ABNORMAL HIGH (ref 8–23)
CO2: 26 mmol/L (ref 22–32)
Calcium: 8.9 mg/dL (ref 8.9–10.3)
Chloride: 97 mmol/L — ABNORMAL LOW (ref 98–111)
Creatinine, Ser: 5.69 mg/dL — ABNORMAL HIGH (ref 0.61–1.24)
GFR, Estimated: 9 mL/min — ABNORMAL LOW (ref >60)
Glucose, Bld: 101 mg/dL — ABNORMAL HIGH (ref 70–99)
Phosphorus: 2.7 mg/dL (ref 2.5–4.6)
Potassium: 4.3 mmol/L (ref 3.5–5.1)
Sodium: 133 mmol/L — ABNORMAL LOW (ref 135–145)

## 2021-05-13 LAB — CBC
HCT: 23.6 % — ABNORMAL LOW (ref 39.0–52.0)
Hemoglobin: 8 g/dL — ABNORMAL LOW (ref 13.0–17.0)
MCH: 32.4 pg (ref 26.0–34.0)
MCHC: 33.9 g/dL (ref 30.0–36.0)
MCV: 95.5 fL (ref 80.0–100.0)
Platelets: 294 10*3/uL (ref 150–400)
RBC: 2.47 MIL/uL — ABNORMAL LOW (ref 4.22–5.81)
RDW: 13.8 % (ref 11.5–15.5)
WBC: 5.2 10*3/uL (ref 4.0–10.5)
nRBC: 0 % (ref 0.0–0.2)

## 2021-05-13 LAB — GLUCOSE, CAPILLARY
Glucose-Capillary: 90 mg/dL (ref 70–99)
Glucose-Capillary: 86 mg/dL (ref 70–99)

## 2021-05-13 LAB — MAGNESIUM: Magnesium: 2.3 mg/dL (ref 1.7–2.4)

## 2021-05-13 SURGERY — INSERTION OF ARTERIOVENOUS (AV) GORE-TEX GRAFT ARM
Anesthesia: General | Site: Arm Upper | Laterality: Right

## 2021-05-13 MED ORDER — PHENYLEPHRINE HCL-NACL 20-0.9 MG/250ML-% IV SOLN
INTRAVENOUS | Status: DC | PRN
  Administered 2021-05-13: 40 ug/min via INTRAVENOUS

## 2021-05-13 MED ORDER — FENTANYL CITRATE (PF) 250 MCG/5ML IJ SOLN
INTRAMUSCULAR | Status: AC
  Filled 2021-05-13: qty 5

## 2021-05-13 MED ORDER — LIDOCAINE 2% (20 MG/ML) 5 ML SYRINGE
INTRAMUSCULAR | Status: DC | PRN
  Administered 2021-05-13: 80 mg via INTRAVENOUS

## 2021-05-13 MED ORDER — CHLORHEXIDINE GLUCONATE 0.12 % MT SOLN: 15.0000 mL | Freq: Once | OROMUCOSAL | Status: AC

## 2021-05-13 MED ORDER — HEPARIN 6000 UNIT IRRIGATION SOLUTION
Status: DC | PRN
Start: 2021-05-13 — End: 2021-05-13
  Administered 2021-05-13: 1

## 2021-05-13 MED ORDER — 0.9 % SODIUM CHLORIDE (POUR BTL) OPTIME
TOPICAL | Status: DC | PRN
Start: 2021-05-13 — End: 2021-05-13
  Administered 2021-05-13: 1000 mL

## 2021-05-13 MED ORDER — FENTANYL CITRATE (PF) 100 MCG/2ML IJ SOLN
INTRAMUSCULAR | Status: DC | PRN
Start: 2021-05-13 — End: 2021-05-13
  Administered 2021-05-13: 25 ug via INTRAVENOUS

## 2021-05-13 MED ORDER — PROPOFOL 10 MG/ML IV BOLUS
INTRAVENOUS | Status: AC
  Filled 2021-05-13: qty 20

## 2021-05-13 MED ORDER — LIDOCAINE-EPINEPHRINE (PF) 1 %-1:200000 IJ SOLN
INTRAMUSCULAR | Status: AC
  Filled 2021-05-13: qty 30

## 2021-05-13 MED ORDER — HEPARIN 6000 UNIT IRRIGATION SOLUTION
Status: AC
  Filled 2021-05-13: qty 500

## 2021-05-13 MED ORDER — AMISULPRIDE (ANTIEMETIC) 5 MG/2ML IV SOLN: 10.0000 mg | Freq: Once | INTRAVENOUS | Status: DC | PRN

## 2021-05-13 MED ORDER — DEXAMETHASONE SODIUM PHOSPHATE 10 MG/ML IJ SOLN
INTRAMUSCULAR | Status: DC | PRN
  Administered 2021-05-13: 5 mg via INTRAVENOUS

## 2021-05-13 MED ORDER — OXYCODONE HCL 5 MG/5ML PO SOLN: 5.0000 mg | Freq: Once | ORAL | Status: DC | PRN

## 2021-05-13 MED ORDER — PROPOFOL 10 MG/ML IV BOLUS
INTRAVENOUS | Status: DC | PRN
  Administered 2021-05-13: 180 mg via INTRAVENOUS

## 2021-05-13 MED ORDER — ORAL CARE MOUTH RINSE: 15.0000 mL | Freq: Once | OROMUCOSAL | Status: AC

## 2021-05-13 MED ORDER — CHLORHEXIDINE GLUCONATE 0.12 % MT SOLN
OROMUCOSAL | Status: AC
  Filled 2021-05-13: qty 15

## 2021-05-13 MED ORDER — CHLORHEXIDINE GLUCONATE 0.12 % MT SOLN
OROMUCOSAL | Status: AC
  Administered 2021-05-13: 15 mL via OROMUCOSAL
  Filled 2021-05-13: qty 15

## 2021-05-13 MED ORDER — ONDANSETRON HCL 4 MG/2ML IJ SOLN: 4.0000 mg | Freq: Once | INTRAMUSCULAR | Status: DC | PRN

## 2021-05-13 MED ORDER — ONDANSETRON HCL 4 MG/2ML IJ SOLN
INTRAMUSCULAR | Status: DC | PRN
  Administered 2021-05-13: 4 mg via INTRAVENOUS

## 2021-05-13 MED ORDER — PHENYLEPHRINE 40 MCG/ML (10ML) SYRINGE FOR IV PUSH (FOR BLOOD PRESSURE SUPPORT)
PREFILLED_SYRINGE | INTRAVENOUS | Status: DC | PRN
  Administered 2021-05-13 (×6): 80 ug via INTRAVENOUS
  Administered 2021-05-13 (×2): 120 ug via INTRAVENOUS
  Administered 2021-05-13: 80 ug via INTRAVENOUS

## 2021-05-13 MED ORDER — SODIUM CHLORIDE 0.9 % IV SOLN: INTRAVENOUS | Status: DC

## 2021-05-13 MED ORDER — OXYCODONE HCL 5 MG PO TABS: 5.0000 mg | ORAL_TABLET | Freq: Once | ORAL | Status: DC | PRN

## 2021-05-13 MED ORDER — FENTANYL CITRATE (PF) 100 MCG/2ML IJ SOLN: 25.0000 ug | INTRAMUSCULAR | Status: DC | PRN

## 2021-05-13 MED ORDER — EPHEDRINE SULFATE (PRESSORS) 50 MG/ML IJ SOLN
INTRAMUSCULAR | Status: DC | PRN
  Administered 2021-05-13: 10 mg via INTRAVENOUS
  Administered 2021-05-13 (×2): 5 mg via INTRAVENOUS

## 2021-05-13 MED ORDER — HEPARIN SODIUM (PORCINE) 1000 UNIT/ML IJ SOLN
INTRAMUSCULAR | Status: AC
  Filled 2021-05-13: qty 3

## 2021-05-13 SURGICAL SUPPLY — 37 items
ADH SKN CLS APL DERMABOND .7 (GAUZE/BANDAGES/DRESSINGS) ×1
ADH SKN CLS LQ APL DERMABOND (GAUZE/BANDAGES/DRESSINGS) ×1
ARMBAND PINK RESTRICT EXTREMIT (MISCELLANEOUS) ×6 IMPLANT
BAG COUNTER SPONGE SURGICOUNT (BAG) ×3 IMPLANT
BAG SPNG CNTER NS LX DISP (BAG) ×1
CANISTER SUCT 3000ML PPV (MISCELLANEOUS) ×3 IMPLANT
CLIP LIGATING EXTRA MED SLVR (CLIP) ×3 IMPLANT
CLIP LIGATING EXTRA SM BLUE (MISCELLANEOUS) ×3 IMPLANT
DERMABOND ADHESIVE PROPEN (GAUZE/BANDAGES/DRESSINGS) ×1
DERMABOND ADVANCED (GAUZE/BANDAGES/DRESSINGS) ×1
DERMABOND ADVANCED .7 DNX12 (GAUZE/BANDAGES/DRESSINGS) ×2 IMPLANT
DERMABOND ADVANCED .7 DNX6 (GAUZE/BANDAGES/DRESSINGS) IMPLANT
ELECT REM PT RETURN 9FT ADLT (ELECTROSURGICAL) ×2
ELECTRODE REM PT RTRN 9FT ADLT (ELECTROSURGICAL) ×2 IMPLANT
GLOVE SURG ENC MOIS LTX SZ7.5 (GLOVE) ×3 IMPLANT
GOWN STRL REUS W/ TWL LRG LVL3 (GOWN DISPOSABLE) ×4 IMPLANT
GOWN STRL REUS W/ TWL XL LVL3 (GOWN DISPOSABLE) ×2 IMPLANT
GOWN STRL REUS W/TWL LRG LVL3 (GOWN DISPOSABLE) ×4
GOWN STRL REUS W/TWL XL LVL3 (GOWN DISPOSABLE) ×2
HEMOSTAT SNOW SURGICEL 2X4 (HEMOSTASIS) IMPLANT
INSERT FOGARTY SM (MISCELLANEOUS) ×3 IMPLANT
KIT BASIN OR (CUSTOM PROCEDURE TRAY) ×3 IMPLANT
KIT TURNOVER KIT B (KITS) ×3 IMPLANT
NS IRRIG 1000ML POUR BTL (IV SOLUTION) ×3 IMPLANT
PACK CV ACCESS (CUSTOM PROCEDURE TRAY) ×3 IMPLANT
PAD ARMBOARD 7.5X6 YLW CONV (MISCELLANEOUS) ×6 IMPLANT
SLING ARM FOAM STRAP LRG (SOFTGOODS) IMPLANT
SLING ARM FOAM STRAP MED (SOFTGOODS) IMPLANT
SUT MNCRL AB 4-0 PS2 18 (SUTURE) ×1 IMPLANT
SUT PROLENE 6 0 BV (SUTURE) ×1 IMPLANT
SUT SILK 2 0 SH (SUTURE) IMPLANT
SUT VIC AB 3-0 SH 27 (SUTURE) ×2
SUT VIC AB 3-0 SH 27X BRD (SUTURE) ×4 IMPLANT
SYR TOOMEY 50ML (SYRINGE) IMPLANT
TOWEL GREEN STERILE (TOWEL DISPOSABLE) ×3 IMPLANT
UNDERPAD 30X36 HEAVY ABSORB (UNDERPADS AND DIAPERS) ×3 IMPLANT
WATER STERILE IRR 1000ML POUR (IV SOLUTION) ×3 IMPLANT

## 2021-05-13 NOTE — Progress Notes (Signed)
?PROGRESS NOTE ? ? ? ?Austin Wheeler  IOE:[id number] DOB: 1940-10-29 DOA: 04/23/2021 ?PCP: Josetta Huddle, MD  ? ? 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 ?  ?Patient started to have increasing bilateral lower extremities swelling about 3 months ago and was started of po Lasix 40 mg daily, swelling appears to be improving, patient however had episodes of feeling lightheadedness and "too much urination" and decided to cut down Lasix from 40 mg daily to 20 mg daily about 1 month ago. Condition remained stable since until about 1 week ago, patient started to notice swelling in his abdomen and legs, and decreased urine output.  Finally, for the past 3 days, there has been barely any urine came out. He said he feeling "bladder fullness" when having BM, but no urine came out. No abdominal pain, no back pain, no shortness of breath, no fever or chills. No Hx of CHF or stroke ?  ?2/24: Admitted to [facility] for acute on chronic renal failure, new onset a. Fib. Nephrology consulted; high dose Lasix trial initiated, large volume paracentesis of 8.3 L ?2/26: PCCM consulted. Transfer to ICU for CRRT  ?2/27: tolerated CRRT ?3/1 Heparin drip restarted 2/28 and patient has had recurrent hematuria and bleeding IV site. Heparin drip now on hold  ?3/6 - Renal biopsy obtained ?3/7 - transition to iHD, tolerating well ?3/8 -3/10 --> Awaiting transfer to CIR; continue to discuss goals of care with palliative team ?3/12 Foley discontinued ?3/15 transfer to snf ?3/16 status post AVF today , seen in HD today. ? ? ? ?Consultants:  ?Nephrology ?Procedures:  ? ?Antimicrobials:  ?  ? ? ?Subjective: ?No sob, cp, or any other sx. Concern he ate tomato soup which has k in it and he didn't know. Assured him he is getting HD today so excess k will be  taken off ? ?Objective: ?Vitals:  ? 05/13/21 1107 05/13/21 1122 05/13/21 1137 05/13/21 1213  ?BP: 120/83 120/83 126/79 122/86  ?Pulse: 98 98 99 98  ?Resp: '12 13 13 15  '$ ?Temp:   98 ?F (36.7 ?C) 97.8 ?F (36.6 ?C)  ?TempSrc:    Oral  ?SpO2: 95% 97% 98% 97%  ?Weight:      ?Height:      ? ? ?Intake/Output Summary (Last 24 hours) at 05/13/2021 1259 ?Last data filed at 05/13/2021 1058 ?Gross per 24 hour  ?Intake 400 ml  ?Output 20 ml  ?Net 380 ml  ? ?Filed Weights  ? 05/12/21 0440 05/13/21 0600 05/13/21 0837  ?Weight: 105.4 kg 107.9 kg 107.9 kg  ? ? ?Examination: ?Calm, NAD ?Cta no w/r ?Reg s1/s2 no gallop ?Soft benign +bs ?No edema ?Aaoxox3  ?Mood and affect appropriate in current setting  ? ? ? ?Data Reviewed: I have personally reviewed following labs and imaging studies ? ?CBC: ?Recent Labs  ?Lab 05/08/21 ?0620 05/12/21 ?0305 05/13/21 ?0230  ?WBC 7.4 5.7 5.2  ?HGB 8.9* 8.1* 8.0*  ?HCT 26.1* 24.1* 23.6*  ?MCV 94.9 94.9 95.5  ?PLT 183 246 294  ? ?Basic Metabolic Panel: ?Recent Labs  ?Lab 05/09/21 ?0321 05/09/21 ?1600 05/10/21 ?0216 05/11/21 ?0110 05/12/21 ?0305 05/13/21 ?0230  ?NA 134* 133* 134* 132* 133* 133*  ?K 3.6 3.6 3.8 3.9 4.1 4.3  ?CL 97* 97* 97* 94* 97* 97*  ?CO2 '29 26 30 26 28 26  '$ ?  GLUCOSE 106* 130* 111* 130* 109* 101*  ?BUN 22 28* 31* 39* 25* 33*  ?CREATININE 4.23* 4.85* 5.24* 6.21* 4.74* 5.69*  ?CALCIUM 8.3* 8.3* 8.7* 8.7*  8.8 8.7* 8.9  ?MG 2.1  --  2.2 2.1 2.3 2.3  ?PHOS 2.2* 2.5 2.9 3.1 2.5 2.7  ? ?GFR: ?Estimated Creatinine Clearance: 13.4 mL/min (A) (by C-G formula based on SCr of 5.69 mg/dL (H)). ?Liver Function Tests: ?Recent Labs  ?Lab 05/09/21 ?1600 05/10/21 ?0216 05/11/21 ?0110 05/12/21 ?0305 05/13/21 ?0230  ?ALBUMIN 2.4* 2.3* 2.4* 2.4* 2.5*  ? ?No results for input(s): LIPASE, AMYLASE in the last 168 hours. ?No results for input(s): AMMONIA in the last 168 hours. ?Coagulation Profile: ?No results for input(s): INR, PROTIME in the last 168 hours. ?Cardiac Enzymes: ?No results for input(s): CKTOTAL,  CKMB, CKMBINDEX, TROPONINI in the last 168 hours. ?BNP (last 3 results) ?No results for input(s): PROBNP in the last 8760 hours. ?HbA1C: ?No results for input(s): HGBA1C in the last 72 hours. ?CBG: ?Recent Labs  ?Lab 05/06/21 ?1428 05/09/21 ?2109 05/10/21 ?4967 05/13/21 ?5916 05/13/21 ?1110  ?GLUCAP 92 121* 108* 90 86  ? ?Lipid Profile: ?No results for input(s): CHOL, HDL, LDLCALC, TRIG, CHOLHDL, LDLDIRECT in the last 72 hours. ?Thyroid Function Tests: ?No results for input(s): TSH, T4TOTAL, FREET4, T3FREE, THYROIDAB in the last 72 hours. ?Anemia Panel: ?No results for input(s): VITAMINB12, FOLATE, FERRITIN, TIBC, IRON, RETICCTPCT in the last 72 hours. ?Sepsis Labs: ?No results for input(s): PROCALCITON, LATICACIDVEN in the last 168 hours. ? ?No results found for this or any previous visit (from the past 240 hour(s)).  ? ? ? ? ? ?Radiology Studies: ?VAS [place] UPPER EXT VEIN MAPPING (PRE-OP AVF) ? ?Result Date: 05/12/2021 ?[place] MAPPING Patient Name:  Austin Wheeler  Date of Exam:   05/12/2021 Medical Rec #: [id number]       Accession #:    [id number] Date of Birth: November 20, 1940       Patient Gender: M Patient Age:   23 years Exam Location:  [facility] Procedure:      VAS [place] UPPER EXT VEIN MAPPING (PRE-OP AVF) Referring Phys: Jannifer Hick --------------------------------------------------------------------------------  Indications: Pre-access. Comparison Study: no prior Performing Technologist: Archie Patten RVS  Examination Guidelines: A complete evaluation includes B-mode imaging, spectral Doppler, color Doppler, and power Doppler as needed of all accessible portions of each vessel. Bilateral testing is considered an integral part of a complete examination. Limited examinations for reoccurring indications may be performed as noted. +-----------------+-------------+----------+---------+ Right Cephalic   Diameter (cm)Depth (cm)Findings  +-----------------+-------------+----------+---------+  Shoulder             0.38        0.67             +-----------------+-------------+----------+---------+ Prox upper arm       0.33        0.48             +-----------------+-------------+----------+---------+ Mid upper arm        0.29        0.32             +-----------------+-------------+----------+---------+ Dist upper arm       0.29        0.31             +-----------------+-------------+----------+---------+ Antecubital fossa    0.47        0.24             +-----------------+-------------+----------+---------+ Prox forearm  0.31        0.47   branching +-----------------+-------------+----------+---------+ Mid forearm          0.45        0.52             +-----------------+-------------+----------+---------+ Dist forearm         0.30        0.36             +-----------------+-------------+----------+---------+ Wrist                0.20        0.43             +-----------------+-------------+----------+---------+ +-----------------+-------------+----------+---------+ Right Basilic    Diameter (cm)Depth (cm)Findings  +-----------------+-------------+----------+---------+ Prox upper arm       0.37        1.05             +-----------------+-------------+----------+---------+ Mid upper arm        0.36        0.63             +-----------------+-------------+----------+---------+ Dist upper arm       0.36        0.78             +-----------------+-------------+----------+---------+ Antecubital fossa    0.34        1.17   branching +-----------------+-------------+----------+---------+ Prox forearm         0.21        0.47             +-----------------+-------------+----------+---------+ Mid forearm          0.23        0.55             +-----------------+-------------+----------+---------+ Distal forearm       0.28        0.40             +-----------------+-------------+----------+---------+ Wrist                 0.20        0.30             +-----------------+-------------+----------+---------+ +-----------------+-------------+----------+--------+ Left Cephalic    Diameter (cm)Depth (cm)Findings +-----------------+-------------+----------+--------+ Shoulder             0.22        0.40            +--------

## 2021-05-13 NOTE — Progress Notes (Signed)
?  Progress Note ? ? ? ?05/13/2021 ?9:11 AM ?Day of Surgery ? ?Subjective:  no overnight issues ? ?Vitals:  ? 05/13/21 0319 05/13/21 0837  ?BP: 114/80 (!) 136/94  ?Pulse: 87 89  ?Resp: 18 18  ?Temp: 98.1 ?F (36.7 ?C) 98.2 ?F (36.8 ?C)  ?SpO2: 99% 98%  ? ? ?Physical Exam: ?Aaox3 ?Non labored respirations ?Right arm cephalic vein palpable, 2+ left radial pulse ? ?CBC ?   ?Component Value Date/Time  ? WBC 5.2 05/13/2021 0230  ? RBC 2.47 (L) 05/13/2021 0230  ? HGB 8.0 (L) 05/13/2021 0230  ? HCT 23.6 (L) 05/13/2021 0230  ? PLT 294 05/13/2021 0230  ? MCV 95.5 05/13/2021 0230  ? MCH 32.4 05/13/2021 0230  ? MCHC 33.9 05/13/2021 0230  ? RDW 13.8 05/13/2021 0230  ? LYMPHSABS 0.6 (L) 05/06/2021 0203  ? MONOABS 1.1 (H) 05/06/2021 0203  ? EOSABS 0.1 05/06/2021 0203  ? BASOSABS 0.0 05/06/2021 0203  ? ? ?BMET ?   ?Component Value Date/Time  ? NA 133 (L) 05/13/2021 0230  ? NA 144 05/13/2020 1051  ? K 4.3 05/13/2021 0230  ? CL 97 (L) 05/13/2021 0230  ? CO2 26 05/13/2021 0230  ? GLUCOSE 101 (H) 05/13/2021 0230  ? BUN 33 (H) 05/13/2021 0230  ? BUN 11 05/13/2020 1051  ? CREATININE 5.69 (H) 05/13/2021 0230  ? CALCIUM 8.9 05/13/2021 0230  ? CALCIUM 8.8 05/11/2021 0110  ? GFRNONAA 9 (L) 05/13/2021 0230  ? GFRAA  02/06/2008 1915  ?  >60        ?The eGFR has been calculated ?using the MDRD equation. ?This calculation has not been ?validated in all clinical  ? ? ?INR ?   ?Component Value Date/Time  ? INR 1.4 (H) 04/29/2021 0415  ? ? ? ?Intake/Output Summary (Last 24 hours) at 05/13/2021 0911 ?Last data filed at 05/12/2021 2155 ?Gross per 24 hour  ?Intake 100 ml  ?Output --  ?Net 100 ml  ? ? ? ?Assessment:  81 y.o. male is here with esrd ? ?Plan: ?OR today for right arm avf vs avg ? ? ?Jolleen Seman C. Donzetta Matters, MD ?[facility] ?Office: [phone] ?Pager: [phone] ? ?05/13/2021 ?9:11 AM ? ?

## 2021-05-13 NOTE — Progress Notes (Signed)
Patient ID: [name], male   DOB: [birth year], 81 y.o.   MRN: [id number] ? ? ? ?Progress Note from the Palliative Medicine Team at [facility] ? ? ?Patient Name: Austin Wheeler        ?Date: 05/13/2021 ?DOB: [birth year]  Age: 81 y.o. MRN#: [id number] ?Attending Physician: Nolberto Hanlon, MD ?Primary Care Physician: Josetta Huddle, MD ?Admit Date: 04/23/2021 ? ? ?Medical records reviewed  ? 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  S/p renal biopsy ?Fistula placed for ongoing OP HD.   ?Tolerating intermittent hemodialysis.  Patient is working with therapies. Plan is for SNF for short term rehabilitation. ? ?I visited and assessed [name] in the dialysis treatment room.  ? ?He is  alert and oriented.   He tells me next steps in his treatment plan; SNF/rehab and hopefully home after that.  He is adjusting to his new normal.  He expresses concern over a new environment and personal care need being met 2/2 to his vision deficit/blindness. ? ?Ongoing education on personal responsibility in his overall wellness and importance of self care and working with the staff at the facility. ?Eduction offered on the logistics of OP HD ? ?Plan of care ?-DNR/DNI ?-Symptom management ?-Patient and family are open to all offered and available medical interventions to prolong life at this time ?-SNF for  ongoing rehabilitation ?     ? ?Discussed with patient the importance of continued conversation with his family and their  medical providers regarding overall plan of care and treatment options,  ensuring decisions are within the context of the patients values and GOCs. ? ?Questions and concerns addressed    ? ?Recommend OP Palliative services at facility, likely will discharge in next day or two   ? ? ?Wadie Lessen NP  ?Palliative Medicine Team Team Phone # [phone] ?Pager [phone] ?  ?

## 2021-05-13 NOTE — Transfer of Care (Signed)
Immediate Anesthesia Transfer of Care Note ? ?Patient: Austin Wheeler ? ?Procedure(s) Performed: CREATION OF RIGHT ARM BRACHIOCEPHALIC FISTULA (Right: Arm Upper) ? ?Patient Location: PACU ? ?Anesthesia Type:General ? ?Level of Consciousness: awake and alert  ? ?Airway & Oxygen Therapy: Patient Spontanous Breathing and Patient connected to nasal cannula oxygen ? ?Post-op Assessment: Report given to RN and Post -op Vital signs reviewed and stable ? ?Post vital signs: Reviewed and stable ? ?Last Vitals:  ?Vitals Value Taken Time  ?BP 120/83 05/13/21 1107  ?Temp    ?Pulse 96 05/13/21 1109  ?Resp 16 05/13/21 1109  ?SpO2 95 % 05/13/21 1109  ?Vitals shown include unvalidated device data. ? ?Last Pain:  ?Vitals:  ? 05/13/21 0858  ?TempSrc:   ?PainSc: 3   ?   ? ?Patients Stated Pain Goal: 1 (05/13/21 5749) ? ?Complications: No notable events documented. ?

## 2021-05-13 NOTE — Anesthesia Procedure Notes (Signed)
Procedure Name: LMA Insertion ?Date/Time: 05/13/2021 10:06 AM ?Performed by: Leonor Liv, CRNA ?Pre-anesthesia Checklist: Patient identified, Emergency Drugs available, Suction available and Patient being monitored ?Patient Re-evaluated:Patient Re-evaluated prior to induction ?Oxygen Delivery Method: Circle System Utilized ?Preoxygenation: Pre-oxygenation with 100% oxygen ?Induction Type: IV induction ?Ventilation: Mask ventilation without difficulty ?LMA: LMA inserted ?LMA Size: 5.0 ?Number of attempts: 1 ?Placement Confirmation: positive ETCO2 and breath sounds checked- equal and bilateral ?Tube secured with: Tape ?Dental Injury: Teeth and Oropharynx as per pre-operative assessment  ? ? ? ? ?

## 2021-05-13 NOTE — TOC Progression Note (Signed)
Transition of Care (TOC) - Progression Note  ? ? ?Patient Details  ?Name: Austin Wheeler ?MRN: [id number] ?Date of Birth: 1941/01/04 ? ?Transition of Care (TOC) CM/SW Contact  ?Vinie Sill, LCSW ?Phone Number: ?05/13/2021, 4:20 PM ? ?Clinical Narrative:    ? ?Renal Navigator/Tracy - is confirming  if patient HD clinic can start his new treatment Saturday- discharge remains pending until confirmed ? ?Thurmond Butts, MSW, LCSW ?Clinical Social Worker ? ? ? ?Expected Discharge Plan: [place] ?Barriers to Discharge: Continued Medical Work up ? ?Expected Discharge Plan and Services ?Expected Discharge Plan: [place] ?  ?  ?  ?  ?                ?  ?  ?  ?  ?  ?  ?  ?  ?  ?  ? ? ?Social Determinants of Health (SDOH) Interventions ?  ? ?Readmission Risk Interventions ?No flowsheet data found. ? ?

## 2021-05-13 NOTE — Op Note (Signed)
? ? ?  Patient name: Austin Wheeler MRN: [id number] DOB: 1940-06-07 Sex: male ? ?05/13/2021 ?Pre-operative Diagnosis: End-stage renal disease ?Post-operative diagnosis:  Same ?Surgeon:  Eda Paschal. Donzetta Matters, MD ?Assistant: Leontine Locket, PA ?Procedure Performed:  Right brachial artery to cephalic vein AV fistula creation ? ?Indications: 81 year old male now with end-stage renal disease dialyzing via right IJ tunnel catheter.  He is now indicated for permanent access.  He is right-hand dominant but appears to have suitable cephalic vein on the right for fistula creation. ? ?Experience assistant was necessary to facilitate exposure of the cephalic vein and brachial artery specifically with suction and retraction and to ensure adequate anastomosis by following the suture line. ? ?Findings: Cephalic vein was large at the antecubital measuring approximately 4 mm.  The brachial artery was also large.  At completion there was a strong thrill in the palpable fistula throughout the upper arm confirmed with Doppler and a palpable radial artery pulse at the wrist. ?  ?Procedure:  The patient was identified in the holding area and taken to the operating was placed supine operative table and LMA anesthesia was induced.  He was sterilely prepped and draped in the right upper extremity usual fashion, antibiotics were minister timeout called.  Ultrasound was used to identify a suitable cephalic vein and brachial artery just below the antecubital.  Transverse incision was made.  Dissected out the vein through the deep fascia the brachial artery with the help of the assistant facilitate exposure using suction retraction.  The vein was then marked for orientation transected and tied off distally flushed heparinized and clamped.  The artery was clamped distally proximally opened longitudinally flushed heparinized saline both directions and reclamped.  The vein was then sewn into side with the assistant facilitating tight anastomosis by  following suture line.  Prior completion of the anastomosis we allowed flushing all directions.  At completion there was a very strong thrill in the vein we freed up some of the soft tissue with the assistant mobilizing the tissue and this was divided sharply and with cautery.  Doppler was then used to confirm flow through the cephalic vein throughout the upper arm and the palpable radial artery pulse was also confirmed with Doppler.  Satisfied with this the wounds were irrigated closed in layers with Vicryl and Monocryl.  Dermabond placed in the skin level.  He was awakened from anesthesia having tolerated procedure without any complication.  All counts were correct at completion. ? ?EBL: 20 cc ? ? ? ? ?Stepheny Canal C. Donzetta Matters, MD ?[facility] ?Office: [phone] ?Pager: [phone] ? ? ?

## 2021-05-13 NOTE — Anesthesia Preprocedure Evaluation (Signed)
Anesthesia Evaluation  ?Patient identified by MRN, date of birth, ID band ?Patient awake ? ? ? ?Reviewed: ?Allergy & Precautions, Patient's Chart, lab work & pertinent test results ? ?Airway ?Mallampati: II ? ?TM Distance: >3 FB ?Neck ROM: Full ? ? ? Dental ? ?(+) Poor Dentition, Missing ?  ?Pulmonary ?neg pulmonary ROS,  ?  ?Pulmonary exam normal ?breath sounds clear to auscultation ? ? ? ? ? ? Cardiovascular ?hypertension,  ?Rhythm:Irregular Rate:Normal ? ?ECHO 2023 ??1. Left ventricular ejection fraction, by estimation, is 55 to 60%. The  ?left ventricle has normal function. The left ventricle has no regional  ?wall motion abnormalities. The left ventricular internal cavity size was  ?moderately dilated. Left ventricular  ?diastolic parameters are indeterminate. There is the interventricular  ?septum is flattened in systole and diastole, consistent with right  ?ventricular pressure and volume overload.  ??2. Right ventricular systolic function is moderately reduced. The right  ?ventricular size is severely enlarged. There is moderately elevated  ?pulmonary artery systolic pressure.  ??3. Left atrial size was mildly dilated.  ??4. Right atrial size was severely dilated.  ??5. Moderate pleural effusion in the left lateral region.  ??6. The mitral valve is normal in structure. Mild mitral valve  ?regurgitation. No evidence of mitral stenosis.  ??7. Tricuspid valve regurgitation is moderate to severe.  ??8. The aortic valve is tricuspid. Aortic valve regurgitation is trivial.  ?Aortic valve sclerosis is present, with no evidence of aortic valve  ?stenosis.  ??9. Aortic dilatation noted. There is mild dilatation of the aortic root,  ?measuring 43 mm. There is mild dilatation of the ascending aorta,  ?measuring 43 mm. There is mild dilatation of the aortic arch, measuring 43  ?mm.  ?10. The inferior vena cava is dilated in size with <50% respiratory  ?variability, suggesting right  atrial pressure of 15 mmHg. ?  ?Neuro/Psych ?blind ?negative psych ROS  ? GI/Hepatic ?negative GI ROS, Neg liver ROS,   ?Endo/Other  ?negative endocrine ROSdiabetes, Poorly Controlled, Type 2 ? Renal/GU ?  ?negative genitourinary ?  ?Musculoskeletal ?negative musculoskeletal ROS ?(+)  ? Abdominal ?  ?Peds ?negative pediatric ROS ?(+)  Hematology ? ?(+) Blood dyscrasia, anemia ,   ?Anesthesia Other Findings ? ? Reproductive/Obstetrics ?negative OB ROS ? ?  ? ? ? ? ? ? ? ? ? ? ? ? ? ?  ?  ? ? ? ? ? ? ? ? ?Anesthesia Physical ?Anesthesia Plan ? ?ASA: 3 ? ?Anesthesia Plan: General  ? ?Post-op Pain Management: Minimal or no pain anticipated  ? ?Induction: Intravenous ? ?PONV Risk Score and Plan: 2 and Treatment may vary due to age or medical condition, Ondansetron and Dexamethasone ? ?Airway Management Planned: LMA ? ?Additional Equipment: None ? ?Intra-op Plan:  ? ?Post-operative Plan: Extubation in OR ? ?Informed Consent: I have reviewed the patients History and Physical, chart, labs and discussed the procedure including the risks, benefits and alternatives for the proposed anesthesia with the patient or authorized representative who has indicated his/her understanding and acceptance.  ? ?Patient has DNR.  ?Discussed DNR with patient and Suspend DNR. ?  ?Dental advisory given ? ?Plan Discussed with: CRNA, Surgeon and Anesthesiologist ? ?Anesthesia Plan Comments:   ? ? ? ? ? ? ?Anesthesia Quick Evaluation ? ?

## 2021-05-13 NOTE — Progress Notes (Signed)
?[facility] ?Progress Note  ? ?Subjective:   s/p AVF with Dr. Donzetta Matters this AM.    No new issues.  For HD today. Wife bedside ? ?Objective ?Vitals:  ? 05/13/21 1107 05/13/21 1122 05/13/21 1137 05/13/21 1213  ?BP: 120/83 120/83 126/79 122/86  ?Pulse: 98 98 99 98  ?Resp: '12 13 13 15  '$ ?Temp:   98 ?F (36.7 ?C) 97.8 ?F (36.6 ?C)  ?TempSrc:    Oral  ?SpO2: 95% 97% 98% 97%  ?Weight:      ?Height:      ? ?Physical Exam ?General: comfortably in bed ?Heart: RRR, no rub ?Lungs: clear ?Abdomen: soft ?Extremities: 1+ tibial edema = improved  ?Dialysis Access:  RIJ TDC, RUE AVF incision c/d/I, 2+ radial pulse and hand warm ? ?Additional Objective ?Labs: ?Basic Metabolic Panel: ?Recent Labs  ?Lab 05/11/21 ?0110 05/12/21 ?0305 05/13/21 ?0230  ?NA 132* 133* 133*  ?K 3.9 4.1 4.3  ?CL 94* 97* 97*  ?CO2 '26 28 26  '$ ?GLUCOSE 130* 109* 101*  ?BUN 39* 25* 33*  ?CREATININE 6.21* 4.74* 5.69*  ?CALCIUM 8.7*  8.8 8.7* 8.9  ?PHOS 3.1 2.5 2.7  ? ? ?Liver Function Tests: ?Recent Labs  ?Lab 05/11/21 ?0110 05/12/21 ?0305 05/13/21 ?0230  ?ALBUMIN 2.4* 2.4* 2.5*  ? ? ?No results for input(s): LIPASE, AMYLASE in the last 168 hours. ?CBC: ?Recent Labs  ?Lab 05/08/21 ?0620 05/12/21 ?0305 05/13/21 ?0230  ?WBC 7.4 5.7 5.2  ?HGB 8.9* 8.1* 8.0*  ?HCT 26.1* 24.1* 23.6*  ?MCV 94.9 94.9 95.5  ?PLT 183 246 294  ? ? ?Blood Culture ?No results found for: SDES, [place], [place], REPTSTATUS ? ?Cardiac Enzymes: ?No results for input(s): CKTOTAL, CKMB, CKMBINDEX, TROPONINI in the last 168 hours. ?CBG: ?Recent Labs  ?Lab 05/06/21 ?1428 05/09/21 ?2109 05/10/21 ?5993 05/13/21 ?5701 05/13/21 ?1110  ?GLUCAP 92 121* 108* 90 86  ? ? ?Iron Studies: No results for input(s): IRON, TIBC, TRANSFERRIN, FERRITIN in the last 72 hours. ?'@lablastinr3'$ @ ?Studies/Results: ?VAS [place] UPPER EXT VEIN MAPPING (PRE-OP AVF) ? ?Result Date: 05/12/2021 ?[place] MAPPING Patient Name:  Austin Wheeler  Date of Exam:   05/12/2021 Medical Rec #: [id number]       Accession #:     [id number] Date of Birth: 05/02/1940       Patient Gender: M Patient Age:   81 years Exam Location:  [facility] Procedure:      VAS [place] UPPER EXT VEIN MAPPING (PRE-OP AVF) Referring Phys: Jannifer Hick --------------------------------------------------------------------------------  Indications: Pre-access. Comparison Study: no prior Performing Technologist: Archie Patten RVS  Examination Guidelines: A complete evaluation includes B-mode imaging, spectral Doppler, color Doppler, and power Doppler as needed of all accessible portions of each vessel. Bilateral testing is considered an integral part of a complete examination. Limited examinations for reoccurring indications may be performed as noted. +-----------------+-------------+----------+---------+ Right Cephalic   Diameter (cm)Depth (cm)Findings  +-----------------+-------------+----------+---------+ Shoulder             0.38        0.67             +-----------------+-------------+----------+---------+ Prox upper arm       0.33        0.48             +-----------------+-------------+----------+---------+ Mid upper arm        0.29        0.32             +-----------------+-------------+----------+---------+ Dist upper arm  0.29        0.31             +-----------------+-------------+----------+---------+ Antecubital fossa    0.47        0.24             +-----------------+-------------+----------+---------+ Prox forearm         0.31        0.47   branching +-----------------+-------------+----------+---------+ Mid forearm          0.45        0.52             +-----------------+-------------+----------+---------+ Dist forearm         0.30        0.36             +-----------------+-------------+----------+---------+ Wrist                0.20        0.43             +-----------------+-------------+----------+---------+ +-----------------+-------------+----------+---------+ Right Basilic     Diameter (cm)Depth (cm)Findings  +-----------------+-------------+----------+---------+ Prox upper arm       0.37        1.05             +-----------------+-------------+----------+---------+ Mid upper arm        0.36        0.63             +-----------------+-------------+----------+---------+ Dist upper arm       0.36        0.78             +-----------------+-------------+----------+---------+ Antecubital fossa    0.34        1.17   branching +-----------------+-------------+----------+---------+ Prox forearm         0.21        0.47             +-----------------+-------------+----------+---------+ Mid forearm          0.23        0.55             +-----------------+-------------+----------+---------+ Distal forearm       0.28        0.40             +-----------------+-------------+----------+---------+ Wrist                0.20        0.30             +-----------------+-------------+----------+---------+ +-----------------+-------------+----------+--------+ Left Cephalic    Diameter (cm)Depth (cm)Findings +-----------------+-------------+----------+--------+ Shoulder             0.22        0.40            +-----------------+-------------+----------+--------+ Prox upper arm       0.20        0.37            +-----------------+-------------+----------+--------+ Mid upper arm        0.16        0.56            +-----------------+-------------+----------+--------+ Dist upper arm       0.30        0.34            +-----------------+-------------+----------+--------+ Antecubital fossa    0.24        0.32            +-----------------+-------------+----------+--------+ Prox forearm  0.33        0.54            +-----------------+-------------+----------+--------+ Mid forearm          0.31        0.39            +-----------------+-------------+----------+--------+ Dist forearm         0.26        0.31             +-----------------+-------------+----------+--------+ Wrist                0.28        0.26            +-----------------+-------------+----------+--------+ +-----------------+-------------+----------+---------+ Left Basilic     Diameter (cm)Depth (cm)Findings  +-----------------+-------------+----------+---------+ Prox upper arm       0.30        0.80             +-----------------+-------------+----------+---------+ Mid upper arm        0.31        0.70             +-----------------+-------------+----------+---------+ Dist upper arm       0.44        0.56             +-----------------+-------------+----------+---------+ Antecubital fossa    0.26        0.42   branching +-----------------+-------------+----------+---------+ Prox forearm         0.22        0.33             +-----------------+-------------+----------+---------+ Mid forearm          0.20        0.27             +-----------------+-------------+----------+---------+ Distal forearm       0.16        0.28             +-----------------+-------------+----------+---------+ *See table(s) above for measurements and observations.  Diagnosing physician: Orlie Pollen Electronically signed by Orlie Pollen on 05/12/2021 at 8:53:56 PM.    Final    ?Medications: ? sodium chloride Stopped (04/28/21 1032)  ? sodium chloride irrigation Stopped (05/08/21 1814)  ? ? aspirin EC  81 mg Oral Daily  ? atropine  1 drop Right Eye QHS  ? brimonidine  1 drop Left Eye TID  ? chlorhexidine      ? Chlorhexidine Gluconate Cloth  6 each Topical Q0600  ? darbepoetin (ARANESP) injection - DIALYSIS  60 mcg Intravenous Q Thu-HD  ? dorzolamide-timolol  1 drop Left Eye BID  ? doxazosin  8 mg Oral Daily  ? dutasteride  0.5 mg Oral Q M,W,F  ? feeding supplement  237 mL Oral TID BM  ? latanoprost  1 drop Left Eye QHS  ? mouth rinse  15 mL Mouth Rinse BID  ? multivitamin with minerals  1 tablet Oral Daily  ? pantoprazole  40 mg Oral Daily  ?  pilocarpine  1 drop Left Eye TID  ? polyethylene glycol  17 g Oral BID  ? prednisoLONE acetate  1 drop Right Eye QHS  ? senna-docusate  1 tablet Oral BID  ? sodium chloride flush  10-40 mL Intracatheter Q12H  ? triamcinolone ointment  1 application. Topical Daily  ? ? 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  Required CRRT 2/26 - 3/1

## 2021-05-13 NOTE — TOC Progression Note (Signed)
Transition of Care (TOC) - Progression Note  ? ? ?Patient Details  ?Name: Austin Wheeler ?MRN: [id number] ?Date of Birth: 05-Sep-1940 ? ?Transition of Care (TOC) CM/SW Contact  ?Vinie Sill, LCSW ?Phone Number: ?05/13/2021, 12:49 PM ? ?Clinical Narrative:    ? ?Received confirmation patient can discharge to [facility] tomorrow-  ?Received insurance auth referral # [id number] 3/16-3/20.  ? ?Covid test requested  ?MD & RN updated  ? ? ?Thurmond Butts, MSW, LCSW ?Clinical Social Worker ? ? ? ? ?Expected Discharge Plan: [place] ?Barriers to Discharge: Continued Medical Work up ? ?Expected Discharge Plan and Services ?Expected Discharge Plan: [place] ?  ?  ?  ?  ?                ?  ?  ?  ?  ?  ?  ?  ?  ?  ?  ? ? ?Social Determinants of Health (SDOH) Interventions ?  ? ?Readmission Risk Interventions ?No flowsheet data found. ? ?

## 2021-05-13 NOTE — Anesthesia Postprocedure Evaluation (Signed)
Anesthesia Post Note ? ?Patient: KAYD LAUNER ? ?Procedure(s) Performed: CREATION OF RIGHT ARM BRACHIOCEPHALIC FISTULA (Right: Arm Upper) ? ?  ? ?Patient location during evaluation: PACU ?Anesthesia Type: General ?Level of consciousness: awake ?Pain management: pain level controlled ?Vital Signs Assessment: post-procedure vital signs reviewed and stable ?Respiratory status: spontaneous breathing and respiratory function stable ?Cardiovascular status: stable ?Postop Assessment: no apparent nausea or vomiting ?Anesthetic complications: no ? ? ?No notable events documented. ? ?Last Vitals:  ?Vitals:  ? 05/13/21 1122 05/13/21 1137  ?BP: 120/83 126/79  ?Pulse: 98 99  ?Resp: 13 13  ?Temp:  36.7 ?C  ?SpO2: 97% 98%  ?  ?Last Pain:  ?Vitals:  ? 05/13/21 1137  ?TempSrc:   ?PainSc: 0-No pain  ? ? ?  ?  ?  ?  ?  ?  ? ?Merlinda Frederick ? ? ? ? ?

## 2021-05-14 ENCOUNTER — Encounter (HOSPITAL_COMMUNITY): Payer: Self-pay | Admitting: Vascular Surgery

## 2021-05-14 DIAGNOSIS — E1110 Type 2 diabetes mellitus with ketoacidosis without coma: Secondary | ICD-10-CM | POA: Diagnosis not present

## 2021-05-14 DIAGNOSIS — H409 Unspecified glaucoma: Secondary | ICD-10-CM | POA: Diagnosis not present

## 2021-05-14 DIAGNOSIS — E875 Hyperkalemia: Secondary | ICD-10-CM | POA: Diagnosis not present

## 2021-05-14 DIAGNOSIS — R2681 Unsteadiness on feet: Secondary | ICD-10-CM | POA: Diagnosis not present

## 2021-05-14 DIAGNOSIS — R52 Pain, unspecified: Secondary | ICD-10-CM | POA: Diagnosis not present

## 2021-05-14 DIAGNOSIS — R2689 Other abnormalities of gait and mobility: Secondary | ICD-10-CM | POA: Diagnosis not present

## 2021-05-14 DIAGNOSIS — M79675 Pain in left toe(s): Secondary | ICD-10-CM | POA: Diagnosis not present

## 2021-05-14 DIAGNOSIS — Z79899 Other long term (current) drug therapy: Secondary | ICD-10-CM | POA: Diagnosis not present

## 2021-05-14 DIAGNOSIS — N170 Acute kidney failure with tubular necrosis: Secondary | ICD-10-CM | POA: Diagnosis not present

## 2021-05-14 DIAGNOSIS — F5102 Adjustment insomnia: Secondary | ICD-10-CM | POA: Diagnosis not present

## 2021-05-14 DIAGNOSIS — I509 Heart failure, unspecified: Secondary | ICD-10-CM | POA: Diagnosis not present

## 2021-05-14 DIAGNOSIS — R531 Weakness: Secondary | ICD-10-CM | POA: Diagnosis not present

## 2021-05-14 DIAGNOSIS — M6259 Muscle wasting and atrophy, not elsewhere classified, multiple sites: Secondary | ICD-10-CM | POA: Diagnosis not present

## 2021-05-14 DIAGNOSIS — N186 End stage renal disease: Secondary | ICD-10-CM | POA: Diagnosis not present

## 2021-05-14 DIAGNOSIS — B351 Tinea unguium: Secondary | ICD-10-CM | POA: Diagnosis not present

## 2021-05-14 DIAGNOSIS — F4321 Adjustment disorder with depressed mood: Secondary | ICD-10-CM | POA: Diagnosis not present

## 2021-05-14 DIAGNOSIS — R41841 Cognitive communication deficit: Secondary | ICD-10-CM | POA: Diagnosis not present

## 2021-05-14 DIAGNOSIS — F418 Other specified anxiety disorders: Secondary | ICD-10-CM | POA: Diagnosis not present

## 2021-05-14 DIAGNOSIS — R601 Generalized edema: Secondary | ICD-10-CM | POA: Diagnosis not present

## 2021-05-14 DIAGNOSIS — H353190 Nonexudative age-related macular degeneration, unspecified eye, stage unspecified: Secondary | ICD-10-CM | POA: Diagnosis not present

## 2021-05-14 DIAGNOSIS — K5900 Constipation, unspecified: Secondary | ICD-10-CM | POA: Diagnosis not present

## 2021-05-14 DIAGNOSIS — Z992 Dependence on renal dialysis: Secondary | ICD-10-CM | POA: Diagnosis not present

## 2021-05-14 DIAGNOSIS — D649 Anemia, unspecified: Secondary | ICD-10-CM | POA: Diagnosis not present

## 2021-05-14 DIAGNOSIS — Z23 Encounter for immunization: Secondary | ICD-10-CM | POA: Insufficient documentation

## 2021-05-14 DIAGNOSIS — R1312 Dysphagia, oropharyngeal phase: Secondary | ICD-10-CM | POA: Diagnosis not present

## 2021-05-14 DIAGNOSIS — H543 Unqualified visual loss, both eyes: Secondary | ICD-10-CM | POA: Diagnosis not present

## 2021-05-14 DIAGNOSIS — D689 Coagulation defect, unspecified: Secondary | ICD-10-CM | POA: Insufficient documentation

## 2021-05-14 DIAGNOSIS — Z7401 Bed confinement status: Secondary | ICD-10-CM | POA: Diagnosis not present

## 2021-05-14 DIAGNOSIS — R0602 Shortness of breath: Secondary | ICD-10-CM | POA: Diagnosis not present

## 2021-05-14 DIAGNOSIS — N179 Acute kidney failure, unspecified: Secondary | ICD-10-CM | POA: Diagnosis present

## 2021-05-14 DIAGNOSIS — M79605 Pain in left leg: Secondary | ICD-10-CM | POA: Diagnosis not present

## 2021-05-14 DIAGNOSIS — M109 Gout, unspecified: Secondary | ICD-10-CM | POA: Diagnosis not present

## 2021-05-14 DIAGNOSIS — E118 Type 2 diabetes mellitus with unspecified complications: Secondary | ICD-10-CM | POA: Diagnosis not present

## 2021-05-14 DIAGNOSIS — M79674 Pain in right toe(s): Secondary | ICD-10-CM | POA: Diagnosis not present

## 2021-05-14 DIAGNOSIS — D75838 Other thrombocytosis: Secondary | ICD-10-CM | POA: Diagnosis not present

## 2021-05-14 DIAGNOSIS — H353110 Nonexudative age-related macular degeneration, right eye, stage unspecified: Secondary | ICD-10-CM | POA: Diagnosis not present

## 2021-05-14 DIAGNOSIS — E119 Type 2 diabetes mellitus without complications: Secondary | ICD-10-CM | POA: Diagnosis not present

## 2021-05-14 DIAGNOSIS — G8190 Hemiplegia, unspecified affecting unspecified side: Secondary | ICD-10-CM | POA: Diagnosis not present

## 2021-05-14 DIAGNOSIS — R198 Other specified symptoms and signs involving the digestive system and abdomen: Secondary | ICD-10-CM | POA: Diagnosis not present

## 2021-05-14 DIAGNOSIS — I10 Essential (primary) hypertension: Secondary | ICD-10-CM | POA: Diagnosis not present

## 2021-05-14 DIAGNOSIS — M6281 Muscle weakness (generalized): Secondary | ICD-10-CM | POA: Diagnosis not present

## 2021-05-14 DIAGNOSIS — E1169 Type 2 diabetes mellitus with other specified complication: Secondary | ICD-10-CM | POA: Diagnosis not present

## 2021-05-14 DIAGNOSIS — M24562 Contracture, left knee: Secondary | ICD-10-CM | POA: Diagnosis not present

## 2021-05-14 DIAGNOSIS — K219 Gastro-esophageal reflux disease without esophagitis: Secondary | ICD-10-CM | POA: Diagnosis not present

## 2021-05-14 DIAGNOSIS — I4891 Unspecified atrial fibrillation: Secondary | ICD-10-CM | POA: Diagnosis not present

## 2021-05-14 LAB — RENAL FUNCTION PANEL
Albumin: 2.5 g/dL — ABNORMAL LOW (ref 3.5–5.0)
Anion gap: 8 (ref 5–15)
BUN: 22 mg/dL (ref 8–23)
CO2: 28 mmol/L (ref 22–32)
Calcium: 8.8 mg/dL — ABNORMAL LOW (ref 8.9–10.3)
Chloride: 97 mmol/L — ABNORMAL LOW (ref 98–111)
Creatinine, Ser: 4.35 mg/dL — ABNORMAL HIGH (ref 0.61–1.24)
GFR, Estimated: 13 mL/min — ABNORMAL LOW (ref >60)
Glucose, Bld: 154 mg/dL — ABNORMAL HIGH (ref 70–99)
Phosphorus: 2.8 mg/dL (ref 2.5–4.6)
Potassium: 4.2 mmol/L (ref 3.5–5.1)
Sodium: 133 mmol/L — ABNORMAL LOW (ref 135–145)

## 2021-05-14 LAB — MAGNESIUM: Magnesium: 2.2 mg/dL (ref 1.7–2.4)

## 2021-05-14 MED ORDER — PANTOPRAZOLE SODIUM 40 MG PO TBEC: 40.0000 mg | DELAYED_RELEASE_TABLET | Freq: Every day | ORAL | 0 refills | Status: DC

## 2021-05-14 MED ORDER — SENNOSIDES-DOCUSATE SODIUM 8.6-50 MG PO TABS: 1.0000 | ORAL_TABLET | Freq: Two times a day (BID) | ORAL | Status: DC

## 2021-05-14 MED ORDER — ENSURE ENLIVE PO LIQD: 237.0000 mL | Freq: Three times a day (TID) | ORAL | 12 refills | Status: DC

## 2021-05-14 MED ORDER — ADULT MULTIVITAMIN W/MINERALS CH: 1.0000 | ORAL_TABLET | Freq: Every day | ORAL | 0 refills | Status: DC

## 2021-05-14 MED ORDER — DUTASTERIDE 0.5 MG PO CAPS: 0.5000 mg | ORAL_CAPSULE | ORAL | 0 refills | Status: DC

## 2021-05-14 MED ORDER — APIXABAN 2.5 MG PO TABS: 2.5000 mg | ORAL_TABLET | Freq: Two times a day (BID) | ORAL | Status: AC

## 2021-05-14 MED ORDER — POLYETHYLENE GLYCOL 3350 17 G PO PACK: 17.0000 g | PACK | Freq: Every day | ORAL | 0 refills | Status: DC

## 2021-05-14 MED ORDER — APIXABAN 2.5 MG PO TABS
2.5000 mg | ORAL_TABLET | Freq: Two times a day (BID) | ORAL | Status: DC
  Administered 2021-05-14: 2.5 mg via ORAL
  Filled 2021-05-14: qty 1

## 2021-05-14 NOTE — Progress Notes (Signed)
?[facility] ?Progress Note  ? ?Subjective:   Patient feels well today with no complaints. ? ?Objective ?Vitals:  ? 05/13/21 2047 05/13/21 2349 05/14/21 0437 05/14/21 0826  ?BP: 117/80 114/77 115/80 111/83  ?Pulse: (!) 102 87 87 90  ?Resp: '20 18 17 16  '$ ?Temp: 97.9 ?F (36.6 ?C) 97.9 ?F (36.6 ?C) 97.9 ?F (36.6 ?C) 97.8 ?F (36.6 ?C)  ?TempSrc: Oral Oral Oral Oral  ?SpO2: 100% 100% 98% 98%  ?Weight:   108.4 kg   ?Height:      ? ?Physical Exam ?General: comfortably in bed in no distress ?Heart: RRR, no rub ?Lungs: clear bilateral chest rise with no increased work of breathing ?Abdomen: soft, nondistended ?Extremities: Trace edema in the bilateral lower extremities = improved  ?Dialysis Access:  RIJ TDC, RUE AVF incision c/d/I, 2+ radial pulse and hand warm ? ?Additional Objective ?Labs: ?Basic Metabolic Panel: ?Recent Labs  ?Lab 05/12/21 ?0305 05/13/21 ?0230 05/14/21 ?0133  ?NA 133* 133* 133*  ?K 4.1 4.3 4.2  ?CL 97* 97* 97*  ?CO2 '28 26 28  '$ ?GLUCOSE 109* 101* 154*  ?BUN 25* 33* 22  ?CREATININE 4.74* 5.69* 4.35*  ?CALCIUM 8.7* 8.9 8.8*  ?PHOS 2.5 2.7 2.8  ? ?Liver Function Tests: ?Recent Labs  ?Lab 05/12/21 ?0305 05/13/21 ?0230 05/14/21 ?0133  ?ALBUMIN 2.4* 2.5* 2.5*  ? ?No results for input(s): LIPASE, AMYLASE in the last 168 hours. ?CBC: ?Recent Labs  ?Lab 05/08/21 ?0620 05/12/21 ?0305 05/13/21 ?0230  ?WBC 7.4 5.7 5.2  ?HGB 8.9* 8.1* 8.0*  ?HCT 26.1* 24.1* 23.6*  ?MCV 94.9 94.9 95.5  ?PLT 183 246 294  ? ?Blood Culture ?No results found for: SDES, [place], [place], REPTSTATUS ? ?Cardiac Enzymes: ?No results for input(s): CKTOTAL, CKMB, CKMBINDEX, TROPONINI in the last 168 hours. ?CBG: ?Recent Labs  ?Lab 05/09/21 ?2109 05/10/21 ?6834 05/13/21 ?1962 05/13/21 ?1110  ?GLUCAP 121* 108* 90 86  ? ?Iron Studies: No results for input(s): IRON, TIBC, TRANSFERRIN, FERRITIN in the last 72 hours. ?'@lablastinr3'$ @ ?Studies/Results: ?No results found. ?Medications: ? sodium chloride Stopped (04/28/21 1032)  ? sodium  chloride irrigation Stopped (05/08/21 1814)  ? ? apixaban  2.5 mg Oral BID  ? aspirin EC  81 mg Oral Daily  ? atropine  1 drop Right Eye QHS  ? brimonidine  1 drop Left Eye TID  ? Chlorhexidine Gluconate Cloth  6 each Topical Q0600  ? darbepoetin (ARANESP) injection - DIALYSIS  60 mcg Intravenous Q Thu-HD  ? dorzolamide-timolol  1 drop Left Eye BID  ? doxazosin  8 mg Oral Daily  ? dutasteride  0.5 mg Oral Q M,W,F  ? feeding supplement  237 mL Oral TID BM  ? latanoprost  1 drop Left Eye QHS  ? mouth rinse  15 mL Mouth Rinse BID  ? multivitamin with minerals  1 tablet Oral Daily  ? pantoprazole  40 mg Oral Daily  ? pilocarpine  1 drop Left Eye TID  ? polyethylene glycol  17 g Oral BID  ? prednisoLONE acetate  1 drop Right Eye QHS  ? senna-docusate  1 tablet Oral BID  ? sodium chloride flush  10-40 mL Intracatheter Q12H  ? triamcinolone ointment  1 application. Topical Daily  ? ? 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  Required CRRT 2/26 - 3/1 and has been subsequently requiring iHD since then.  Intradialytic rise in Cr + low UOP suggestive of no recovery.  Vein mapping +  VVS consult re: perm access, appreciated - BC AVF done 3/16.  HD TTS.   CLIP complete - has outpt arranged for [facility] TTS 12:30 when ready.  ? ?**Anemia:  had some blood loss with hematuria but this is resolved.  Hb 8s, appears iron replete.  Cont  ESA.  ? ?**A fib:  rate controlled on BB, on eliquis (on hold pending AVF surgery) ? ?**BMM:  ca and phos ok, PTH pending  ? ?**Hyponatremia:  hypervolemic, improving with HD/UF.  ? ?**DM ?**h/o gout ? ?Will follow, call with concerns.  ?Planning for discharge today.  Start dialysis outpatient tomorrow ? ? ? ? ?

## 2021-05-14 NOTE — Progress Notes (Signed)
Pt to d/c to snf today. Met with pt's wife to discuss/confirm pt's out-pt HD schedule and that pt will start at clinic tomorrow. Wife aware that pt needs to arrive at [facility] at 11:40 tomorrow to complete paperwork prior to 12:30 chair time. Wife also aware that pt will need to arrive at [facility] on Tuesday at 12:10. Wife aware and agreeable. Wife requests that snf be made aware of this info. Confirmed with CSW that snf is aware of above info. Spoke to [place] at [facility] who is aware pt will start tomorrow. Spoke to renal NP regarding clinic's need for orders today for treatment tomorrow. Pt's out-pt HD schedule placed on AVS as well.  ? ?Melven Sartorius ?Renal Navigator ?[phone] ?

## 2021-05-14 NOTE — Progress Notes (Signed)
Physical Therapy Treatment ?Patient Details ?Name: Austin Wheeler ?MRN: [id number] ?DOB: 1940-04-01 ?Today's Date: 05/14/2021 ? ? ?History of Present Illness 81 y/o male who presents on 04/23/21 with SOB, weakness and swelling abdomen/LEs. Found to have new onset A-fib, AKI injury of unknown origin started on HD, s/p paracentesis 2/24. Renal biopsy planned fof 05/03/21. PMH includes DM, HTN, legally blind. ? ?  ?PT Comments  ? ? Pt received in supine, agreeable to therapy session with encouragement, with good participation and tolerance for bed mobility and transfer training. Pt able to perform bed mobility with modA via log roll to L EOB and seated lateral scooting toward HOB with modA. Pt attempted sit>stand from elevated bed height to RW but unable to achieve upright with +1 maxA. Session time limited due to arrival of PTAR to transport pt to SNF. Pt continues to benefit from PT services to progress toward functional mobility goals.   ?Recommendations for follow up therapy are one component of a multi-disciplinary discharge planning process, led by the attending physician.  Recommendations may be updated based on patient status, additional functional criteria and insurance authorization. ? ?Follow Up Recommendations ? Acute inpatient rehab (3hours/day) ?  ?  ?Assistance Recommended at Discharge Frequent or constant Supervision/Assistance  ?Patient can return home with the following Two people to help with walking and/or transfers;Help with stairs or ramp for entrance;Assistance with cooking/housework;Two people to help with bathing/dressing/bathroom ?  ?Equipment Recommendations ? Wheelchair (measurements PT);Wheelchair cushion (measurements PT);BSC/3in1 (pt 58f 6")  ?  ?Recommendations for Other Services   ? ? ?  ?Precautions / Restrictions Precautions ?Precautions: Fall ?Precaution Comments: legally blind, A-fib, watch HR, bowel/bladder incontinence (helpful to have briefs for OOB) ?Restrictions ?Weight Bearing  Restrictions: No  ?  ? ?Mobility ? Bed Mobility ?Overal bed mobility: Needs Assistance ?Bed Mobility: Rolling, Sidelying to Sit, Sit to Sidelying ?Rolling: Min assist ?Sidelying to sit: Mod assist ?  ?  ?Sit to sidelying: Mod assist ?General bed mobility comments: increased time and instructions on sequencing; log roll to L EOB ?  ? ?Transfers ?Overall transfer level: Needs assistance ?Equipment used: Rolling walker (2 wheels) ?Transfers: Sit to/from Stand ?Sit to Stand: Total assist, From elevated surface ?  ?  ?  ?  ? Lateral/Scoot Transfers: Mod assist, From elevated surface ?General transfer comment: pt unable to deweight hips off EOB with +1 max to totalA (anxiety), but able to perform multiple small lateral scoots toward L HOB with modA and transfer pad assist ?  ?  ? ? ?  ?Balance Overall balance assessment: Needs assistance ?Sitting-balance support: Feet supported, No upper extremity supported ?Sitting balance-Leahy Scale: Fair ?Sitting balance - Comments: required UE support initally but progressed to sitting with feet supported only, intermittent cues for CoG due to pt blindness ?  ?  ?  ?  ?  ?  ?  ? ?  ?Cognition Arousal/Alertness: Awake/alert ?[facility]/performed ?Overall Cognitive Status: Within Functional Limits for tasks assessed ?  ?  ?  ?  ?  ?  ?  ?General Comments: Patient anxious and easily overwhelmed with info, needs quiet environment and increased time to perform mobility tasks. ?  ?  ? ?  ?Exercises Other Exercises ?Other Exercises: supine BLE AROM: ankle pumps, heel slides x10 reps ea ?Other Exercises: seated BLE AROM: hip flexion, LAQ x10 reps ea ?Other Exercises: lateral leans to L/R with elbow taps x3 reps ea ? ?  ?General Comments General comments (skin integrity,  edema, etc.): VSS on RA SpO2 98% sitting EOB ?  ?  ? ?Pertinent Vitals/Pain Pain Assessment ?Pain Assessment: Faces ?Faces Pain Scale: Hurts little more ?Pain Location: L wrist (IV site),  sacral pressure, feet/hips intermittently ?Pain Descriptors / Indicators: Grimacing, Guarding, Discomfort, Aching, Tender ?Pain Intervention(s): Monitored during session, Repositioned  ? ? ? ?PT Goals (current goals can now be found in the care plan section) Acute Rehab PT Goals ?PT Goal Formulation: With patient ?Time For Goal Achievement: 05/14/21 ?Progress towards PT goals: Progressing toward goals ? ?  ?Frequency ? ? ? Min 3X/week ? ? ? ?  ?PT Plan Current plan remains appropriate  ? ? ?   ?AM-PAC PT "6 Clicks" Mobility   ?Outcome Measure ? Help needed turning from your back to your side while in a flat bed without using bedrails?: A Little ?Help needed moving from lying on your back to sitting on the side of a flat bed without using bedrails?: A Lot ?Help needed moving to and from a bed to a chair (including a wheelchair)?: Total ?Help needed standing up from a chair using your arms (e.g., wheelchair or bedside chair)?: Total ?Help needed to walk in hospital room?: Total ?Help needed climbing 3-5 steps with a railing? : Total ?6 Click Score: 9 ? ?  ?End of Session Equipment Utilized During Treatment: Gait belt ?Activity Tolerance: Patient tolerated treatment well;Other (comment) (time limited due to arrival of transport team to take pt to SNF) ?Patient left: in bed;with call bell/phone within reach;with nursing/sitter in room ?Nurse Communication: Mobility status ?PT Visit Diagnosis: Pain;Muscle weakness (generalized) (M62.81);Difficulty in walking, not elsewhere classified (R26.2) ?Pain - Right/Left: Left (L wrist, R foot) ?Pain - part of body: Ankle and joints of foot (wrist at IV site) ?  ? ? ?Time: 2025-4270 ?PT Time Calculation (min) (ACUTE ONLY): 29 min ? ?Charges:  $Therapeutic Exercise: 8-22 mins ?$Therapeutic Activity: 8-22 mins          ?          ? ?Jeryn Cerney P., PTA ?Acute Rehabilitation Services ?Pager: [phone] ?Office: [phone]  ? ? ?Baylei Siebels M Navayah Sok ?05/14/2021, 12:54 PM ? ?

## 2021-05-14 NOTE — TOC Transition Note (Signed)
Transition of Care (TOC) - CM/SW Discharge Note ? ? ?Patient Details  ?Name: Austin Wheeler [place]ale ?MRN: [id number] ?Date of Birth: [birth year] ? ?Transition of Care (TOC) CM/SW Contact:  ?Vinie Sill, LCSW ?Phone Number: ?05/14/2021, 11:38 AM ? ? ?Clinical Narrative:    ? ?Patient will Discharge to: [facility]  ?Discharge Date: [place] ?Family Notified: Spouse ?Transport By: Corey Harold ? ?Per MD patient is ready for discharge. RN, patient, and facility notified of discharge. Discharge Summary sent to facility. RN given number for [phone]. Ambulance transport requested for patient.  ? ?Clinical Social Worker signing off. ? ?Thurmond Butts, MSW, LCSW ?Clinical Social Worker ? ? ? ? ?Final next level of care: [place] ?Barriers to Discharge: Barriers Resolved ? ? ?Patient Goals and CMS Choice ?  ?  ?  ? ?Discharge Placement ?  ?           ?Patient chooses bed at: [facility] ?Patient to be transferred to facility by: PTAR ?Name of family member notified: spouse ?Patient and family notified of of transfer: 05/14/21 ? ?Discharge Plan and Services ?  ?  ?           ?  ?  ?  ?  ?  ?  ?  ?  ?  ?  ? ?Social Determinants of Health (SDOH) Interventions ?  ? ? ?Readmission Risk Interventions ?No flowsheet data found. ? ? ? ? ?

## 2021-05-14 NOTE — Care Management Important Message (Signed)
Important Message ? ?Patient Details  ?Name: Austin Wheeler ?MRN: [id number] ?Date of Birth: 02-08-1941 ? ? ?Medicare Important Message Given:  Yes ? ? ? ? ?Shelda Altes ?05/14/2021, 8:39 AM ?

## 2021-05-14 NOTE — Progress Notes (Addendum)
[facility] ? ?Subjective  - Left arm hurts since they drew blood. ? ? ?Objective ?115/80 ?87 ?97.9 ?F (36.6 ?C) (Oral) ?17 ?98% ? ?Intake/Output Summary (Last 24 hours) at 05/14/2021 0815 ?Last data filed at 05/13/2021 2047 ?Gross per 24 hour  ?Intake 540 ml  ?Output 20 ml  ?Net 520 ml  ? ? ?Right UE incision healing well ?Palpable thrill in fistula, radial pulse weakly palpable, no pain and sensation intact with good grip strength. ?Lungs non labored breathing ? ? ?Assessment/Planning: ?POD # 1 right BC AV fistula creation ? ?Good thrill in fistula, no symptoms of steal ?Heat pack to left AC blood draw site for comfort. ?Stable post op disposition from VVS point of view.  Will sign off.  ?F/U with VVS 4-6 weeks with duplex of fistula arranged ? ?Roxy Horseman ?05/14/2021 ?8:15 AM ?-- ? ?Laboratory ?Lab Results: ?Recent Labs  ?  05/12/21 ?0305 05/13/21 ?0230  ?WBC 5.7 5.2  ?HGB 8.1* 8.0*  ?HCT 24.1* 23.6*  ?PLT 246 294  ? ?BMET ?Recent Labs  ?  05/13/21 ?0230 05/14/21 ?0133  ?NA 133* 133*  ?K 4.3 4.2  ?CL 97* 97*  ?CO2 26 28  ?GLUCOSE 101* 154*  ?BUN 33* 22  ?CREATININE 5.69* 4.35*  ?CALCIUM 8.9 8.8*  ? ? ?COAG ?Lab Results  ?Component Value Date  ? INR 1.4 (H) 04/29/2021  ? INR 1.5 (H) 04/27/2021  ? INR 1.6 (H) 04/23/2021  ? ?No results found for: PTT ? ? ?I have independently interviewed and examined patient and agree with PA assessment and plan above.  ? ?Dailey Buccheri C. Donzetta Matters, MD ?[facility] ?Office: [phone] ?Pager: [phone] ? ?

## 2021-05-14 NOTE — Discharge Summary (Signed)
Austin Wheeler VZD:[id number] DOB: 1940-06-02 DOA: 04/23/2021 ? ?PCP: Josetta Huddle, MD ? ?Admit date: 04/23/2021 ?Discharge date: 05/14/2021 ? ?Admitted From: home ?Disposition:  SNF ? ?Recommendations for Outpatient Follow-up:  ?Follow up with PCP in 1 weeks ?Please obtain BMP/CBC in one week ?Follow up with Vascular surgery in 4-6 weeks with duplex of fistular arranged ?Follow up with dialysis tomorrow ?  ?Discharge Condition: Stable ?CODE STATUS: DNR ?Diet recommendation: Renal diet, dysphagia 3 ?  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 ?  ?Patient started to have increasing bilateral lower extremities swelling about 3 months ago and was started of po Lasix 40 mg daily, swelling appears to be improving, patient however had episodes of feeling lightheadedness and "too much urination" and decided to cut down Lasix from 40 mg daily to 20 mg daily about 1 month ago. Condition remained stable since until about 1 week ago, patient started to notice swelling in his abdomen and legs, and decreased urine output.  Finally, for the past 3 days, there has been barely any urine came out. He said he feeling "bladder fullness" when having BM, but no urine came out. No abdominal pain, no back pain, no shortness of breath, no fever or chills. No Hx of CHF or stroke ?  ?2/24: Admitted to [facility] for acute on chronic renal failure, new onset a. Fib. Nephrology consulted; high dose Lasix trial initiated, large volume paracentesis of 8.3 L ?2/26: PCCM consulted. Transfer to ICU for CRRT  ?2/27: tolerated CRRT ?3/1 Heparin drip restarted 2/28 and patient has had recurrent hematuria and bleeding IV site. Heparin drip now on hold  ?3/6 - Renal biopsy obtained ?3/7 - transition to iHD, tolerating well ?3/8 -3/10 --> Awaiting transfer  to CIR; continue to discuss goals of care with palliative team ?3/12 -Foley discontinued ?3/13-14 awaiting [employer] to approve CIR **update, [employer] declined CIR -now awaiting SNF approval ?  ?3/17 addendume: No complaints this am. Stable for discharged. Cleared it with nephrology. S/p AVF with Dr. Donzetta Matters on 3/16. Had HD on 3/16 afterwards. ?  ?Discharge Diagnoses:  ?  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  Required CRRT 2/26 - 3/1 and has been subsequently requiring iHD since then.  Intradialytic rise in Cr + low UOP suggestive of no recovery.  Vein mapping + VVS consult re: perm access, appreciated - BC AVF done 3/16.  HD TTS, - has outpt arranged for [facility] TTS 12:30 when ready.  ?  ?  ?New onset atrial fibrillation ?- Rate currently controlled with metoprolol.   ?- CHADVASC score of 4 (age, HTN, DM), initially holding anticoagulation in setting of gross hematuria.  ?- Continue Eliquis 2.5 twice daily given age and GFR ?  ?Acute anemia, acute blood loss anemia unlikely chronic anemia of chronic disease.  POA  ?-Hemoglobin stabilizing, expect baseline chronic disease given ESRD and dialysis ?-Hematuria  minimal, appears to have resolved ?  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 ?- Continue home ophthalmic meds  ?  ?Hyponatremia, improving ?- Likely secondary to volume overload ?- Controlled on dialysis with nephrology ?  ?Thrombocytopenia.  ?- Resolved ?  ?Type 2 DM uncontrolled with Hypoglycemia.  ?- Hypoglycemia resolved with supportive care and increase p.o. intake.  ?- CBGs  within goal ?- A1c 4.8 ?  ?Hyperkalemia ?- Resolved with HD ?  ?Metabolic acidosis  ?-Resolved ?  ?L great toe skin breakdown, POA ?Gout, history of -not in acute exacerbation ?-Patient complaining of diffuse joint pain daily, uric acid low; low risk for acute gout flare ?-Left hand and left elbow pain secondary to contractures, continue range of motion exercises as explained at bedside ?  ? ? ?Discharge Diagnoses:  ?Principal Problem: ?  AKI (acute kidney injury) ([place]) ?Active Problems: ?  Pressure injury of skin ?  Protein-calorie malnutrition, severe ?Lt great toe skin breakdown ?Hyperkalemia ?Metabolic Acidosis ? ? ?Discharge Instructions ? ?Discharge Instructions   ? ? Diet - low sodium heart healthy   Complete by: As directed ?  ? Discharge instructions   Complete by: As directed ?  ? Follo wup with dialysis tomorrow  ? Discharge patient   Complete by: As directed ?  ? Discharge disposition: [facility] Facility  ? Discharge patient date: 05/10/2021  ? Increase activity slowly   Complete by: As directed ?  ? No wound care   Complete by: As directed ?  ? ?  ? ?Allergies as of 05/14/2021   ? ?   Reactions  ? Guaifenesin Swelling, Rash  ? Hand and feet swelling from Robitussin  ? Influenza Vaccines Other (See Comments)  ? Caused flu-like illness  ? Lexapro [escitalopram] Nausea And Vomiting  ? Reported by [facility] - pt does not recall  ? ?  ? ?  ?Medication List  ?  ? ?STOP taking these medications   ? ?amLODipine 10 MG tablet ?Commonly known as: NORVASC ?  ?cholecalciferol 25 MCG (1000 UNIT) tablet ?Commonly known as: VITAMIN D3 ?  ?furosemide 40 MG tablet ?Commonly known as: LASIX ?  ?Garlic 856 MG Tabs ?  ?metFORMIN 500 MG tablet ?Commonly known as: GLUCOPHAGE ?  ?metoprolol tartrate 50 MG tablet ?Commonly known as: LOPRESSOR ?  ?olmesartan-hydrochlorothiazide 40-12.5 MG tablet ?Commonly known as: BENICAR HCT ?  ?OVER THE COUNTER MEDICATION ?  ?potassium chloride SA 20 MEQ tablet ?Commonly known as:  KLOR-CON M ?  ?PreserVision AREDS 2 Caps ?  ?vitamin C 500 MG tablet ?Commonly known as: ASCORBIC ACID ?  ?vitamin E 180 MG (400 UNITS) capsule ?  ? ?  ? ?TAKE these medications   ? ?acetaminophen 500 MG tablet ?Commonly known as: TYLENOL ?Take 500 mg by mouth daily as needed for headache (pain). ?  ?apixaban 2.5 MG Tabs tablet ?Commonly known as: ELIQUIS ?Take 1 tablet (2.5 mg total) by mouth 2 (two) times daily. ?  ?aspirin EC 81 MG tablet ?81 mg every morning. ?  ?atropine 1 % ophthalmic solution ?Place 1 drop into the right eye at bedtime. ?  ?brimonidine 0.2 % ophthalmic solution ?Commonly known as: ALPHAGAN ?Place 1 drop into the left eye 3 (three) times daily. ?  ?dorzolamide-timolol 22.3-6.8 MG/ML ophthalmic solution ?Commonly known as: COSOPT ?Place 1 drop into the left eye 2 (two) times daily. ?  ?doxazosin 8 MG tablet ?Commonly known as: CARDURA ?Take 1 tablet (8 mg total) by mouth daily. ?What changed: when to take this ?  ?dutasteride 0.5 MG capsule ?Commonly  known as: AVODART ?Take 1 capsule (0.5 mg total) by mouth every Monday, Wednesday, and Friday. ?  ?feeding supplement Liqd ?Take 237 mLs by mouth 3 (three) times daily between meals. ?  ?latanoprost 0.005 % ophthalmic solution ?Commonly known as: XALATAN ?Place 1 drop into the left eye at bedtime. ?  ?multivitamin with minerals Tabs tablet ?Take 1 tablet by mouth daily. ?What changed: when to take this ?  ?pantoprazole 40 MG tablet ?Commonly known as: PROTONIX ?Take 1 tablet (40 mg total) by mouth daily. ?  ?pilocarpine 4 % ophthalmic solution ?Commonly known as: PILOCAR ?Place 1 drop into the left eye 3 (three) times daily. ?  ?polyethylene glycol 17 g packet ?Commonly known as: MIRALAX / GLYCOLAX ?Take 17 g by mouth daily. ?  ?prednisoLONE acetate 1 % ophthalmic suspension ?Commonly known as: PRED FORTE ?Place 1 drop into the right eye at bedtime. ?  ?senna-docusate 8.6-50 MG tablet ?Commonly known as: Senokot-S ?Take 1 tablet by mouth 2 (two)  times daily. ?  ?triamcinolone ointment 0.1 % ?Commonly known as: KENALOG ?Apply 1 application topically See admin instructions. Apply topically legs nightly after compression stockings are removed ?  ? ?  ? ?

## 2021-05-14 NOTE — Progress Notes (Signed)
Patient Austin Wheeler      DOB: 1941/02/07      DJM:[id number] ? ? ? ?  ?Palliative Medicine Team ? ? ? ? ?Subjective: Bedside symptom check prior to discharge planned today to [facility] with outpatient HD dialysis center T/Th/Saturday. ? ? ?Physical exam: Initially spoke with patient's wife in waiting room while patient was receiving nursing care. Wife was very excited and nervous for discharge plan. She mentioned to this RN her desire for him to have strong rehab outcomes and to have his teeth looked at, as that pain has been affecting his eating. She also voiced some concerns with his recent loss of taste and new strict renal diet. Though she felt that the dietitian had given them a good list as reference to begin new habits for diet. While walking to the room, the renal navigator spoke to the wife and provided additional information for approved HD treatments to resume tomorrow outpatient and provided paperwork with appointment details. Wife was very appreciative of this follow up. We then went to the patient's bedside and this RN reintroduced self to the patient, explained palliative's role throughout his admission. He was very thankful for our participation and continued support. He voices his only concern relates to his blindness and how he will be treated in the new rehab facility. This RN provided active listening of his concerns and encouraged him to be very straight forward when meeting new staff and advocating for his needs. These include clear communication of care being provided, plans for the day, and his desire to operate as independently as safely as possible. He was agreeable to these things and had no other concerns or needs.  ? ? ?Assessment and plan: Plan to discharge today pending transport to [facility] and resume HD treatments with [facility] outpatient tomorrow and T/Th/Saturday schedule.  ? ? ?Thank you for allowing the Palliative Medicine Team to assist in the care of this patient. ?   ?  ?Damian Leavell, MSN, RN ?Palliative Medicine Team ?Team Phone: [phone]  ?This phone is monitored 7a-7p, please reach out to attending physician outside of these hours for urgent needs.   ?

## 2021-05-15 DIAGNOSIS — N186 End stage renal disease: Secondary | ICD-10-CM | POA: Diagnosis not present

## 2021-05-15 DIAGNOSIS — Z992 Dependence on renal dialysis: Secondary | ICD-10-CM | POA: Diagnosis not present

## 2021-05-18 DIAGNOSIS — N179 Acute kidney failure, unspecified: Secondary | ICD-10-CM | POA: Diagnosis not present

## 2021-05-18 DIAGNOSIS — Z992 Dependence on renal dialysis: Secondary | ICD-10-CM | POA: Diagnosis not present

## 2021-05-18 DIAGNOSIS — D649 Anemia, unspecified: Secondary | ICD-10-CM | POA: Diagnosis not present

## 2021-05-18 DIAGNOSIS — I4891 Unspecified atrial fibrillation: Secondary | ICD-10-CM | POA: Diagnosis not present

## 2021-05-18 DIAGNOSIS — N170 Acute kidney failure with tubular necrosis: Secondary | ICD-10-CM | POA: Diagnosis not present

## 2021-05-18 DIAGNOSIS — E118 Type 2 diabetes mellitus with unspecified complications: Secondary | ICD-10-CM | POA: Diagnosis not present

## 2021-05-18 DIAGNOSIS — M109 Gout, unspecified: Secondary | ICD-10-CM | POA: Diagnosis not present

## 2021-05-18 DIAGNOSIS — H409 Unspecified glaucoma: Secondary | ICD-10-CM | POA: Diagnosis not present

## 2021-05-18 DIAGNOSIS — N186 End stage renal disease: Secondary | ICD-10-CM | POA: Diagnosis not present

## 2021-05-19 DIAGNOSIS — H543 Unqualified visual loss, both eyes: Secondary | ICD-10-CM | POA: Diagnosis not present

## 2021-05-19 DIAGNOSIS — Z992 Dependence on renal dialysis: Secondary | ICD-10-CM | POA: Diagnosis not present

## 2021-05-19 DIAGNOSIS — M6259 Muscle wasting and atrophy, not elsewhere classified, multiple sites: Secondary | ICD-10-CM | POA: Diagnosis not present

## 2021-05-19 DIAGNOSIS — R2681 Unsteadiness on feet: Secondary | ICD-10-CM | POA: Diagnosis not present

## 2021-05-19 DIAGNOSIS — H353110 Nonexudative age-related macular degeneration, right eye, stage unspecified: Secondary | ICD-10-CM | POA: Diagnosis not present

## 2021-05-19 DIAGNOSIS — K219 Gastro-esophageal reflux disease without esophagitis: Secondary | ICD-10-CM | POA: Diagnosis not present

## 2021-05-19 DIAGNOSIS — R52 Pain, unspecified: Secondary | ICD-10-CM | POA: Diagnosis not present

## 2021-05-19 DIAGNOSIS — N186 End stage renal disease: Secondary | ICD-10-CM | POA: Diagnosis not present

## 2021-05-20 DIAGNOSIS — N186 End stage renal disease: Secondary | ICD-10-CM | POA: Diagnosis not present

## 2021-05-20 DIAGNOSIS — Z992 Dependence on renal dialysis: Secondary | ICD-10-CM | POA: Diagnosis not present

## 2021-05-21 NOTE — Progress Notes (Signed)
Late entry  ? ?  ?Provided service recovery on patient. 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 Patient verbalized understanding. All questions have been answered. Attending MD and Infection Disease MD have been notified and will continue to follow patient.  ?  ?

## 2021-05-22 DIAGNOSIS — N186 End stage renal disease: Secondary | ICD-10-CM | POA: Diagnosis not present

## 2021-05-22 DIAGNOSIS — Z992 Dependence on renal dialysis: Secondary | ICD-10-CM | POA: Diagnosis not present

## 2021-05-24 DIAGNOSIS — K219 Gastro-esophageal reflux disease without esophagitis: Secondary | ICD-10-CM | POA: Diagnosis not present

## 2021-05-24 DIAGNOSIS — H353110 Nonexudative age-related macular degeneration, right eye, stage unspecified: Secondary | ICD-10-CM | POA: Diagnosis not present

## 2021-05-24 DIAGNOSIS — N186 End stage renal disease: Secondary | ICD-10-CM | POA: Diagnosis not present

## 2021-05-24 DIAGNOSIS — R2681 Unsteadiness on feet: Secondary | ICD-10-CM | POA: Diagnosis not present

## 2021-05-24 DIAGNOSIS — R52 Pain, unspecified: Secondary | ICD-10-CM | POA: Diagnosis not present

## 2021-05-24 DIAGNOSIS — M6259 Muscle wasting and atrophy, not elsewhere classified, multiple sites: Secondary | ICD-10-CM | POA: Diagnosis not present

## 2021-05-24 DIAGNOSIS — H543 Unqualified visual loss, both eyes: Secondary | ICD-10-CM | POA: Diagnosis not present

## 2021-05-25 DIAGNOSIS — N186 End stage renal disease: Secondary | ICD-10-CM | POA: Diagnosis not present

## 2021-05-25 DIAGNOSIS — Z992 Dependence on renal dialysis: Secondary | ICD-10-CM | POA: Diagnosis not present

## 2021-05-26 DIAGNOSIS — R52 Pain, unspecified: Secondary | ICD-10-CM | POA: Diagnosis not present

## 2021-05-26 DIAGNOSIS — H543 Unqualified visual loss, both eyes: Secondary | ICD-10-CM | POA: Diagnosis not present

## 2021-05-26 DIAGNOSIS — K219 Gastro-esophageal reflux disease without esophagitis: Secondary | ICD-10-CM | POA: Diagnosis not present

## 2021-05-26 DIAGNOSIS — M6259 Muscle wasting and atrophy, not elsewhere classified, multiple sites: Secondary | ICD-10-CM | POA: Diagnosis not present

## 2021-05-26 DIAGNOSIS — D649 Anemia, unspecified: Secondary | ICD-10-CM | POA: Diagnosis not present

## 2021-05-26 DIAGNOSIS — M109 Gout, unspecified: Secondary | ICD-10-CM | POA: Diagnosis not present

## 2021-05-26 DIAGNOSIS — R2681 Unsteadiness on feet: Secondary | ICD-10-CM | POA: Diagnosis not present

## 2021-05-26 DIAGNOSIS — N186 End stage renal disease: Secondary | ICD-10-CM | POA: Diagnosis not present

## 2021-05-26 DIAGNOSIS — D75838 Other thrombocytosis: Secondary | ICD-10-CM | POA: Diagnosis not present

## 2021-05-26 DIAGNOSIS — H353190 Nonexudative age-related macular degeneration, unspecified eye, stage unspecified: Secondary | ICD-10-CM | POA: Diagnosis not present

## 2021-05-26 DIAGNOSIS — M79605 Pain in left leg: Secondary | ICD-10-CM | POA: Diagnosis not present

## 2021-05-26 DIAGNOSIS — M24562 Contracture, left knee: Secondary | ICD-10-CM | POA: Diagnosis not present

## 2021-05-26 DIAGNOSIS — I4891 Unspecified atrial fibrillation: Secondary | ICD-10-CM | POA: Diagnosis not present

## 2021-05-27 DIAGNOSIS — Z992 Dependence on renal dialysis: Secondary | ICD-10-CM | POA: Diagnosis not present

## 2021-05-27 DIAGNOSIS — N186 End stage renal disease: Secondary | ICD-10-CM | POA: Diagnosis not present

## 2021-05-28 DIAGNOSIS — N186 End stage renal disease: Secondary | ICD-10-CM | POA: Diagnosis not present

## 2021-05-28 DIAGNOSIS — N170 Acute kidney failure with tubular necrosis: Secondary | ICD-10-CM | POA: Diagnosis not present

## 2021-05-28 DIAGNOSIS — Z992 Dependence on renal dialysis: Secondary | ICD-10-CM | POA: Diagnosis not present

## 2021-05-29 DIAGNOSIS — N186 End stage renal disease: Secondary | ICD-10-CM | POA: Diagnosis not present

## 2021-05-29 DIAGNOSIS — Z992 Dependence on renal dialysis: Secondary | ICD-10-CM | POA: Diagnosis not present

## 2021-06-01 DIAGNOSIS — N186 End stage renal disease: Secondary | ICD-10-CM | POA: Diagnosis not present

## 2021-06-01 DIAGNOSIS — Z992 Dependence on renal dialysis: Secondary | ICD-10-CM | POA: Diagnosis not present

## 2021-06-03 DIAGNOSIS — R198 Other specified symptoms and signs involving the digestive system and abdomen: Secondary | ICD-10-CM | POA: Diagnosis not present

## 2021-06-03 DIAGNOSIS — N186 End stage renal disease: Secondary | ICD-10-CM | POA: Diagnosis not present

## 2021-06-03 DIAGNOSIS — Z992 Dependence on renal dialysis: Secondary | ICD-10-CM | POA: Diagnosis not present

## 2021-06-03 DIAGNOSIS — Z79899 Other long term (current) drug therapy: Secondary | ICD-10-CM | POA: Diagnosis not present

## 2021-06-03 DIAGNOSIS — E118 Type 2 diabetes mellitus with unspecified complications: Secondary | ICD-10-CM | POA: Diagnosis not present

## 2021-06-05 DIAGNOSIS — N186 End stage renal disease: Secondary | ICD-10-CM | POA: Diagnosis not present

## 2021-06-05 DIAGNOSIS — Z992 Dependence on renal dialysis: Secondary | ICD-10-CM | POA: Diagnosis not present

## 2021-06-07 ENCOUNTER — Ambulatory Visit: Payer: Medicare PPO | Admitting: Podiatry

## 2021-06-07 ENCOUNTER — Encounter: Payer: Self-pay | Admitting: Podiatry

## 2021-06-07 DIAGNOSIS — R2681 Unsteadiness on feet: Secondary | ICD-10-CM | POA: Diagnosis not present

## 2021-06-07 DIAGNOSIS — N179 Acute kidney failure, unspecified: Secondary | ICD-10-CM

## 2021-06-07 DIAGNOSIS — M79675 Pain in left toe(s): Secondary | ICD-10-CM | POA: Diagnosis not present

## 2021-06-07 DIAGNOSIS — M79674 Pain in right toe(s): Secondary | ICD-10-CM

## 2021-06-07 DIAGNOSIS — I5082 Biventricular heart failure: Secondary | ICD-10-CM | POA: Insufficient documentation

## 2021-06-07 DIAGNOSIS — B351 Tinea unguium: Secondary | ICD-10-CM | POA: Diagnosis not present

## 2021-06-07 DIAGNOSIS — E1165 Type 2 diabetes mellitus with hyperglycemia: Secondary | ICD-10-CM | POA: Insufficient documentation

## 2021-06-07 DIAGNOSIS — K219 Gastro-esophageal reflux disease without esophagitis: Secondary | ICD-10-CM | POA: Insufficient documentation

## 2021-06-07 DIAGNOSIS — N186 End stage renal disease: Secondary | ICD-10-CM | POA: Diagnosis not present

## 2021-06-07 DIAGNOSIS — J309 Allergic rhinitis, unspecified: Secondary | ICD-10-CM | POA: Insufficient documentation

## 2021-06-07 DIAGNOSIS — M109 Gout, unspecified: Secondary | ICD-10-CM | POA: Insufficient documentation

## 2021-06-07 DIAGNOSIS — E119 Type 2 diabetes mellitus without complications: Secondary | ICD-10-CM

## 2021-06-07 DIAGNOSIS — N489 Disorder of penis, unspecified: Secondary | ICD-10-CM | POA: Insufficient documentation

## 2021-06-07 DIAGNOSIS — R269 Unspecified abnormalities of gait and mobility: Secondary | ICD-10-CM | POA: Insufficient documentation

## 2021-06-07 DIAGNOSIS — H353190 Nonexudative age-related macular degeneration, unspecified eye, stage unspecified: Secondary | ICD-10-CM | POA: Diagnosis not present

## 2021-06-07 DIAGNOSIS — H4050X0 Glaucoma secondary to other eye disorders, unspecified eye, stage unspecified: Secondary | ICD-10-CM | POA: Insufficient documentation

## 2021-06-07 DIAGNOSIS — M6259 Muscle wasting and atrophy, not elsewhere classified, multiple sites: Secondary | ICD-10-CM | POA: Diagnosis not present

## 2021-06-07 DIAGNOSIS — I509 Heart failure, unspecified: Secondary | ICD-10-CM | POA: Insufficient documentation

## 2021-06-07 DIAGNOSIS — I10 Essential (primary) hypertension: Secondary | ICD-10-CM | POA: Insufficient documentation

## 2021-06-07 DIAGNOSIS — H5410 Blindness, one eye, low vision other eye, unspecified eyes: Secondary | ICD-10-CM | POA: Insufficient documentation

## 2021-06-07 DIAGNOSIS — R972 Elevated prostate specific antigen [PSA]: Secondary | ICD-10-CM | POA: Insufficient documentation

## 2021-06-07 DIAGNOSIS — K5900 Constipation, unspecified: Secondary | ICD-10-CM | POA: Insufficient documentation

## 2021-06-07 DIAGNOSIS — N400 Enlarged prostate without lower urinary tract symptoms: Secondary | ICD-10-CM | POA: Insufficient documentation

## 2021-06-07 DIAGNOSIS — E1169 Type 2 diabetes mellitus with other specified complication: Secondary | ICD-10-CM | POA: Insufficient documentation

## 2021-06-07 DIAGNOSIS — H543 Unqualified visual loss, both eyes: Secondary | ICD-10-CM | POA: Diagnosis not present

## 2021-06-07 DIAGNOSIS — R52 Pain, unspecified: Secondary | ICD-10-CM | POA: Diagnosis not present

## 2021-06-07 DIAGNOSIS — E785 Hyperlipidemia, unspecified: Secondary | ICD-10-CM | POA: Insufficient documentation

## 2021-06-07 NOTE — Progress Notes (Signed)
This patient presents  to my office for at risk foot care.  This patient requires this care by a professional since this patient will be at risk due to having CKD and type 2 diabetes and blindness.  This patient is unable to cut nails himself since the patient cannot reach his nails.These nails are painful walking and wearing shoes. He presents to the office in a wheelchair. This patient presents for at risk foot care today. ? ?General Appearance  Alert, conversant and in no acute stress. ? ?Vascular  Dorsalis pedis and posterior tibial  pulses are  not palpable due to swelling  bilaterally.  Capillary return is within normal limits  bilaterally. Temperature is within normal limits  bilaterally. ? ?Neurologic  Senn-Weinstein monofilament wire test diminished  bilaterally. Muscle power within normal limits bilaterally. ? ?Nails Thick disfigured discolored nails with subungual debris  from hallux to fifth toes bilaterally. No evidence of bacterial infection or drainage bilaterally. ? ?Orthopedic  No limitations of motion  feet .  No crepitus or effusions noted.  No bony pathology or digital deformities noted. ? ?Skin  normotropic skin with no porokeratosis noted bilaterally.  No signs of infections or ulcers noted.    ? ?Onychomycosis  Pain in right toes  Pain in left toes ? ?Consent was obtained for treatment procedures.   Mechanical debridement of nails 1-5  bilaterally performed with a nail nipper.  Filed with dremel without incident.  ? ? ?Return office visit   3 months                  Told patient to return for periodic foot care and evaluation due to potential at risk complications. ? ? ?Gardiner Barefoot DPM   ?

## 2021-06-08 DIAGNOSIS — Z992 Dependence on renal dialysis: Secondary | ICD-10-CM | POA: Diagnosis not present

## 2021-06-08 DIAGNOSIS — N186 End stage renal disease: Secondary | ICD-10-CM | POA: Diagnosis not present

## 2021-06-09 DIAGNOSIS — R2681 Unsteadiness on feet: Secondary | ICD-10-CM | POA: Diagnosis not present

## 2021-06-09 DIAGNOSIS — K219 Gastro-esophageal reflux disease without esophagitis: Secondary | ICD-10-CM | POA: Diagnosis not present

## 2021-06-09 DIAGNOSIS — N186 End stage renal disease: Secondary | ICD-10-CM | POA: Diagnosis not present

## 2021-06-09 DIAGNOSIS — R52 Pain, unspecified: Secondary | ICD-10-CM | POA: Diagnosis not present

## 2021-06-09 DIAGNOSIS — M6259 Muscle wasting and atrophy, not elsewhere classified, multiple sites: Secondary | ICD-10-CM | POA: Diagnosis not present

## 2021-06-09 DIAGNOSIS — H353190 Nonexudative age-related macular degeneration, unspecified eye, stage unspecified: Secondary | ICD-10-CM | POA: Diagnosis not present

## 2021-06-09 DIAGNOSIS — H543 Unqualified visual loss, both eyes: Secondary | ICD-10-CM | POA: Diagnosis not present

## 2021-06-10 DIAGNOSIS — N186 End stage renal disease: Secondary | ICD-10-CM | POA: Diagnosis not present

## 2021-06-10 DIAGNOSIS — R52 Pain, unspecified: Secondary | ICD-10-CM | POA: Diagnosis not present

## 2021-06-10 DIAGNOSIS — Z992 Dependence on renal dialysis: Secondary | ICD-10-CM | POA: Diagnosis not present

## 2021-06-10 DIAGNOSIS — H353190 Nonexudative age-related macular degeneration, unspecified eye, stage unspecified: Secondary | ICD-10-CM | POA: Diagnosis not present

## 2021-06-10 DIAGNOSIS — K219 Gastro-esophageal reflux disease without esophagitis: Secondary | ICD-10-CM | POA: Diagnosis not present

## 2021-06-10 DIAGNOSIS — M6259 Muscle wasting and atrophy, not elsewhere classified, multiple sites: Secondary | ICD-10-CM | POA: Diagnosis not present

## 2021-06-10 DIAGNOSIS — H543 Unqualified visual loss, both eyes: Secondary | ICD-10-CM | POA: Diagnosis not present

## 2021-06-10 DIAGNOSIS — R2681 Unsteadiness on feet: Secondary | ICD-10-CM | POA: Diagnosis not present

## 2021-06-12 DIAGNOSIS — N186 End stage renal disease: Secondary | ICD-10-CM | POA: Diagnosis not present

## 2021-06-12 DIAGNOSIS — Z992 Dependence on renal dialysis: Secondary | ICD-10-CM | POA: Diagnosis not present

## 2021-06-14 DIAGNOSIS — N186 End stage renal disease: Secondary | ICD-10-CM | POA: Diagnosis not present

## 2021-06-14 DIAGNOSIS — H543 Unqualified visual loss, both eyes: Secondary | ICD-10-CM | POA: Diagnosis not present

## 2021-06-14 DIAGNOSIS — R52 Pain, unspecified: Secondary | ICD-10-CM | POA: Diagnosis not present

## 2021-06-14 DIAGNOSIS — H353190 Nonexudative age-related macular degeneration, unspecified eye, stage unspecified: Secondary | ICD-10-CM | POA: Diagnosis not present

## 2021-06-14 DIAGNOSIS — I4891 Unspecified atrial fibrillation: Secondary | ICD-10-CM | POA: Diagnosis not present

## 2021-06-14 DIAGNOSIS — K219 Gastro-esophageal reflux disease without esophagitis: Secondary | ICD-10-CM | POA: Diagnosis not present

## 2021-06-14 DIAGNOSIS — K5900 Constipation, unspecified: Secondary | ICD-10-CM | POA: Diagnosis not present

## 2021-06-14 DIAGNOSIS — R2681 Unsteadiness on feet: Secondary | ICD-10-CM | POA: Diagnosis not present

## 2021-06-14 DIAGNOSIS — E118 Type 2 diabetes mellitus with unspecified complications: Secondary | ICD-10-CM | POA: Diagnosis not present

## 2021-06-14 DIAGNOSIS — M6259 Muscle wasting and atrophy, not elsewhere classified, multiple sites: Secondary | ICD-10-CM | POA: Diagnosis not present

## 2021-06-14 DIAGNOSIS — Z992 Dependence on renal dialysis: Secondary | ICD-10-CM | POA: Diagnosis not present

## 2021-06-15 DIAGNOSIS — Z992 Dependence on renal dialysis: Secondary | ICD-10-CM | POA: Diagnosis not present

## 2021-06-15 DIAGNOSIS — N186 End stage renal disease: Secondary | ICD-10-CM | POA: Diagnosis not present

## 2021-06-16 DIAGNOSIS — N186 End stage renal disease: Secondary | ICD-10-CM | POA: Diagnosis not present

## 2021-06-16 DIAGNOSIS — R52 Pain, unspecified: Secondary | ICD-10-CM | POA: Diagnosis not present

## 2021-06-16 DIAGNOSIS — M6259 Muscle wasting and atrophy, not elsewhere classified, multiple sites: Secondary | ICD-10-CM | POA: Diagnosis not present

## 2021-06-16 DIAGNOSIS — K5900 Constipation, unspecified: Secondary | ICD-10-CM | POA: Diagnosis not present

## 2021-06-16 DIAGNOSIS — H353190 Nonexudative age-related macular degeneration, unspecified eye, stage unspecified: Secondary | ICD-10-CM | POA: Diagnosis not present

## 2021-06-16 DIAGNOSIS — H543 Unqualified visual loss, both eyes: Secondary | ICD-10-CM | POA: Diagnosis not present

## 2021-06-16 DIAGNOSIS — R2681 Unsteadiness on feet: Secondary | ICD-10-CM | POA: Diagnosis not present

## 2021-06-16 DIAGNOSIS — K219 Gastro-esophageal reflux disease without esophagitis: Secondary | ICD-10-CM | POA: Diagnosis not present

## 2021-06-17 ENCOUNTER — Other Ambulatory Visit: Payer: Self-pay

## 2021-06-17 DIAGNOSIS — N186 End stage renal disease: Secondary | ICD-10-CM | POA: Diagnosis not present

## 2021-06-17 DIAGNOSIS — H543 Unqualified visual loss, both eyes: Secondary | ICD-10-CM | POA: Diagnosis not present

## 2021-06-17 DIAGNOSIS — N179 Acute kidney failure, unspecified: Secondary | ICD-10-CM

## 2021-06-17 DIAGNOSIS — M6259 Muscle wasting and atrophy, not elsewhere classified, multiple sites: Secondary | ICD-10-CM | POA: Diagnosis not present

## 2021-06-17 DIAGNOSIS — R2681 Unsteadiness on feet: Secondary | ICD-10-CM | POA: Diagnosis not present

## 2021-06-17 DIAGNOSIS — R52 Pain, unspecified: Secondary | ICD-10-CM | POA: Diagnosis not present

## 2021-06-17 DIAGNOSIS — K5900 Constipation, unspecified: Secondary | ICD-10-CM | POA: Diagnosis not present

## 2021-06-17 DIAGNOSIS — K219 Gastro-esophageal reflux disease without esophagitis: Secondary | ICD-10-CM | POA: Diagnosis not present

## 2021-06-17 DIAGNOSIS — Z992 Dependence on renal dialysis: Secondary | ICD-10-CM | POA: Diagnosis not present

## 2021-06-17 DIAGNOSIS — H353190 Nonexudative age-related macular degeneration, unspecified eye, stage unspecified: Secondary | ICD-10-CM | POA: Diagnosis not present

## 2021-06-19 DIAGNOSIS — Z992 Dependence on renal dialysis: Secondary | ICD-10-CM | POA: Diagnosis not present

## 2021-06-19 DIAGNOSIS — N186 End stage renal disease: Secondary | ICD-10-CM | POA: Diagnosis not present

## 2021-06-21 DIAGNOSIS — K219 Gastro-esophageal reflux disease without esophagitis: Secondary | ICD-10-CM | POA: Diagnosis not present

## 2021-06-21 DIAGNOSIS — H353110 Nonexudative age-related macular degeneration, right eye, stage unspecified: Secondary | ICD-10-CM | POA: Diagnosis not present

## 2021-06-21 DIAGNOSIS — M6259 Muscle wasting and atrophy, not elsewhere classified, multiple sites: Secondary | ICD-10-CM | POA: Diagnosis not present

## 2021-06-21 DIAGNOSIS — K5900 Constipation, unspecified: Secondary | ICD-10-CM | POA: Diagnosis not present

## 2021-06-21 DIAGNOSIS — N186 End stage renal disease: Secondary | ICD-10-CM | POA: Diagnosis not present

## 2021-06-21 DIAGNOSIS — H543 Unqualified visual loss, both eyes: Secondary | ICD-10-CM | POA: Diagnosis not present

## 2021-06-21 DIAGNOSIS — R2681 Unsteadiness on feet: Secondary | ICD-10-CM | POA: Diagnosis not present

## 2021-06-21 DIAGNOSIS — R52 Pain, unspecified: Secondary | ICD-10-CM | POA: Diagnosis not present

## 2021-06-22 DIAGNOSIS — Z992 Dependence on renal dialysis: Secondary | ICD-10-CM | POA: Diagnosis not present

## 2021-06-22 DIAGNOSIS — N186 End stage renal disease: Secondary | ICD-10-CM | POA: Diagnosis not present

## 2021-06-23 ENCOUNTER — Ambulatory Visit (HOSPITAL_COMMUNITY)
Admission: RE | Admit: 2021-06-23 | Discharge: 2021-06-23 | Disposition: A | Discharge status: Home / Self Care | Payer: Medicare PPO | Source: Ambulatory Visit | Attending: Vascular Surgery | Admitting: Vascular Surgery

## 2021-06-23 ENCOUNTER — Ambulatory Visit (INDEPENDENT_AMBULATORY_CARE_PROVIDER_SITE_OTHER): Payer: Medicare PPO | Admitting: Physician Assistant

## 2021-06-23 ENCOUNTER — Other Ambulatory Visit: Payer: Self-pay

## 2021-06-23 VITALS — BP 127/91 | HR 96 | Temp 97.6°F | Resp 20 | Ht 78.0 in

## 2021-06-23 DIAGNOSIS — K219 Gastro-esophageal reflux disease without esophagitis: Secondary | ICD-10-CM | POA: Diagnosis not present

## 2021-06-23 DIAGNOSIS — M6259 Muscle wasting and atrophy, not elsewhere classified, multiple sites: Secondary | ICD-10-CM | POA: Diagnosis not present

## 2021-06-23 DIAGNOSIS — R52 Pain, unspecified: Secondary | ICD-10-CM | POA: Diagnosis not present

## 2021-06-23 DIAGNOSIS — N179 Acute kidney failure, unspecified: Secondary | ICD-10-CM | POA: Diagnosis not present

## 2021-06-23 DIAGNOSIS — N186 End stage renal disease: Secondary | ICD-10-CM | POA: Diagnosis not present

## 2021-06-23 DIAGNOSIS — H353110 Nonexudative age-related macular degeneration, right eye, stage unspecified: Secondary | ICD-10-CM | POA: Diagnosis not present

## 2021-06-23 DIAGNOSIS — K5900 Constipation, unspecified: Secondary | ICD-10-CM | POA: Diagnosis not present

## 2021-06-23 DIAGNOSIS — Z992 Dependence on renal dialysis: Secondary | ICD-10-CM

## 2021-06-23 DIAGNOSIS — H543 Unqualified visual loss, both eyes: Secondary | ICD-10-CM | POA: Diagnosis not present

## 2021-06-23 DIAGNOSIS — R2681 Unsteadiness on feet: Secondary | ICD-10-CM | POA: Diagnosis not present

## 2021-06-23 NOTE — Progress Notes (Signed)
?POST OPERATIVE OFFICE NOTE ? ? ? ?CC:  F/u for surgery ? ?HPI:  This is a 81 y.o. male who is s/p right BC AV fistula on 05/04/21 by Dr. Donzetta Matters.   ? ?Pt returns today for follow up.  Pt states he has no right UE pain. Weakness or loss of sensation.   H is on HD TTS via right TDC.   ? ? ?Allergies  ?Allergen Reactions  ? Guaifenesin Swelling and Rash  ?  Hand and feet swelling from Robitussin  ? Influenza Vaccines Other (See Comments)  ?  Caused flu-like illness  ? Lexapro [Escitalopram] Nausea And Vomiting  ?  Reported by [facility] - pt does not recall  ? ? ?Current Outpatient Medications  ?Medication Sig Dispense Refill  ? acetaminophen (TYLENOL) 500 MG tablet Take 500 mg by mouth daily as needed for headache (pain).    ? apixaban (ELIQUIS) 2.5 MG TABS tablet Take 1 tablet (2.5 mg total) by mouth 2 (two) times daily. 60 tablet   ? aspirin EC 81 MG tablet 81 mg every morning.    ? atropine 1 % ophthalmic solution Place 1 drop into the right eye at bedtime.    ? brimonidine (ALPHAGAN) 0.2 % ophthalmic solution Place 1 drop into the left eye 3 (three) times daily.    ? dorzolamide-timolol (COSOPT) 22.3-6.8 MG/ML ophthalmic solution Place 1 drop into the left eye 2 (two) times daily.    ? doxazosin (CARDURA) 8 MG tablet Take 1 tablet (8 mg total) by mouth daily. (Patient taking differently: Take 8 mg by mouth every morning.) 90 tablet 3  ? dutasteride (AVODART) 0.5 MG capsule Take 1 capsule (0.5 mg total) by mouth every Monday, Wednesday, and Friday. 12 capsule 0  ? feeding supplement (ENSURE ENLIVE / ENSURE PLUS) LIQD Take 237 mLs by mouth 3 (three) times daily between meals. 237 mL 12  ? furosemide (LASIX) 40 MG tablet Take 40 mg by mouth every morning.    ? latanoprost (XALATAN) 0.005 % ophthalmic solution Place 1 drop into the left eye at bedtime.    ? Multiple Vitamin (MULTIVITAMIN WITH MINERALS) TABS tablet Take 1 tablet by mouth daily. 30 tablet 0  ? olmesartan-hydrochlorothiazide (BENICAR HCT) 40-12.5 MG  tablet Take by mouth.    ? pantoprazole (PROTONIX) 40 MG tablet Take 1 tablet (40 mg total) by mouth daily. 30 tablet 0  ? pilocarpine (PILOCAR) 4 % ophthalmic solution Place 1 drop into the left eye 3 (three) times daily.  11  ? polyethylene glycol (MIRALAX / GLYCOLAX) 17 g packet Take 17 g by mouth daily. 14 each 0  ? prednisoLONE acetate (PRED FORTE) 1 % ophthalmic suspension Place 1 drop into the right eye at bedtime.    ? senna-docusate (SENOKOT-S) 8.6-50 MG tablet Take 1 tablet by mouth 2 (two) times daily.    ? triamcinolone ointment (KENALOG) 0.1 % Apply 1 application topically See admin instructions. Apply topically legs nightly after compression stockings are removed    ? VITAMIN D-1000 MAX ST 25 MCG (1000 UT) tablet Take 1,000 Units by mouth 2 (two) times daily.    ? ?No current facility-administered medications for this visit.  ? ? ? ROS:  See HPI ? ?Physical Exam: ? ?Findings:  ?+--------------------+----------+-----------------+--------+  ?AVF                 PSV (cm/s)Flow Vol (mL/min)Comments  ?+--------------------+----------+-----------------+--------+  ?Native artery inflow   121  839                 ?+--------------------+----------+-----------------+--------+  ?AVF Anastomosis        423                               ?+--------------------+----------+-----------------+--------+  ? ?   ?+------------+----------+-------------+----------+-------------------------  ?----+  ?OUTFLOW VEINPSV (cm/s)Diameter (cm)Depth (cm)          Describe        ?      ?+------------+----------+-------------+----------+-------------------------  ?----+  ?Shoulder       275        0.49        0.43                             ?      ?+------------+----------+-------------+----------+-------------------------  ?----+  ?Prox UA        154        0.53        0.34                             ?      ?+------------+----------+-------------+----------+-------------------------  ?----+   ?Mid UA         201        0.44        0.31         competing branch    ?      ?+------------+----------+-------------+----------+-------------------------  ?----+  ?Dist UA        194        0.62        0.44    mildly tortuous  ?mid-distal    ?                                                       upper arm        ?      ?+------------+----------+-------------+----------+-------------------------  ?----+  ?AC Fossa        72        1.08        0.22                             ?      ?+------------+----------+-------------+----------+-------------------------  ?----+  ?   ?Summary:  ?Patent arteriovenous fistula with one competing branch and mild tortuosity  ?in  ?the mid to distal upper arm observed. ? ?Incision:  well healed ?Extremities:  fistula has good thrill, radial pulse palpable ?Neuro: sensation intact ?Lungs non labored breathing ? ? ? ?Assessment/Plan:  This is a 81 y.o. male who is s/p: right BC AV fistula the duplex demonstrates a patent fistula, but the diameter is small < 0.6 mid to proximal fistula with tortuosity and competing branches.   ? I will schedule him for fistula superficialization and branch ligation with DR. Donzetta Matters.   ? ? ?Roxy Horseman ? PA-C ?[facility] ?[phone] ? ? ?Clinic MD:  Donzetta Matters ?

## 2021-06-23 NOTE — H&P (View-Only) (Signed)
POST OPERATIVE OFFICE NOTE    CC:  F/u for surgery  HPI:  This is a 81 y.o. male who is s/p right BC AV fistula on 05/04/21 by Dr. Donzetta Matters.    Pt returns today for follow up.  Pt states he has no right UE pain. Weakness or loss of sensation.   H is on HD TTS via right TDC.     Allergies  Allergen Reactions   Guaifenesin Swelling and Rash    Hand and feet swelling from Robitussin   Influenza Vaccines Other (See Comments)    Caused flu-like illness   Lexapro [Escitalopram] Nausea And Vomiting    Reported by [facility] - pt does not recall    Current Outpatient Medications  Medication Sig Dispense Refill   acetaminophen (TYLENOL) 500 MG tablet Take 500 mg by mouth daily as needed for headache (pain).     apixaban (ELIQUIS) 2.5 MG TABS tablet Take 1 tablet (2.5 mg total) by mouth 2 (two) times daily. 60 tablet    aspirin EC 81 MG tablet 81 mg every morning.     atropine 1 % ophthalmic solution Place 1 drop into the right eye at bedtime.     brimonidine (ALPHAGAN) 0.2 % ophthalmic solution Place 1 drop into the left eye 3 (three) times daily.     dorzolamide-timolol (COSOPT) 22.3-6.8 MG/ML ophthalmic solution Place 1 drop into the left eye 2 (two) times daily.     doxazosin (CARDURA) 8 MG tablet Take 1 tablet (8 mg total) by mouth daily. (Patient taking differently: Take 8 mg by mouth every morning.) 90 tablet 3   dutasteride (AVODART) 0.5 MG capsule Take 1 capsule (0.5 mg total) by mouth every Monday, Wednesday, and Friday. 12 capsule 0   feeding supplement (ENSURE ENLIVE / ENSURE PLUS) LIQD Take 237 mLs by mouth 3 (three) times daily between meals. 237 mL 12   furosemide (LASIX) 40 MG tablet Take 40 mg by mouth every morning.     latanoprost (XALATAN) 0.005 % ophthalmic solution Place 1 drop into the left eye at bedtime.     Multiple Vitamin (MULTIVITAMIN WITH MINERALS) TABS tablet Take 1 tablet by mouth daily. 30 tablet 0   olmesartan-hydrochlorothiazide (BENICAR HCT) 40-12.5 MG  tablet Take by mouth.     pantoprazole (PROTONIX) 40 MG tablet Take 1 tablet (40 mg total) by mouth daily. 30 tablet 0   pilocarpine (PILOCAR) 4 % ophthalmic solution Place 1 drop into the left eye 3 (three) times daily.  11   polyethylene glycol (MIRALAX / GLYCOLAX) 17 g packet Take 17 g by mouth daily. 14 each 0   prednisoLONE acetate (PRED FORTE) 1 % ophthalmic suspension Place 1 drop into the right eye at bedtime.     senna-docusate (SENOKOT-S) 8.6-50 MG tablet Take 1 tablet by mouth 2 (two) times daily.     triamcinolone ointment (KENALOG) 0.1 % Apply 1 application topically See admin instructions. Apply topically legs nightly after compression stockings are removed     VITAMIN D-1000 MAX ST 25 MCG (1000 UT) tablet Take 1,000 Units by mouth 2 (two) times daily.     No current facility-administered medications for this visit.     ROS:  See HPI  Physical Exam:  Findings:  +--------------------+----------+-----------------+--------+  AVF                 PSV (cm/s)Flow Vol (mL/min)Comments  +--------------------+----------+-----------------+--------+  Native artery inflow   121  839                 +--------------------+----------+-----------------+--------+  AVF Anastomosis        423                               +--------------------+----------+-----------------+--------+      +------------+----------+-------------+----------+-------------------------  ----+  OUTFLOW VEINPSV (cm/s)Diameter (cm)Depth (cm)          Describe              +------------+----------+-------------+----------+-------------------------  ----+  Shoulder       275        0.49        0.43                                   +------------+----------+-------------+----------+-------------------------  ----+  Prox UA        154        0.53        0.34                                   +------------+----------+-------------+----------+-------------------------  ----+   Mid UA         201        0.44        0.31         competing branch          +------------+----------+-------------+----------+-------------------------  ----+  Dist UA        194        0.62        0.44    mildly tortuous  mid-distal                                                           upper arm              +------------+----------+-------------+----------+-------------------------  ----+  AC Fossa        72        1.08        0.22                                   +------------+----------+-------------+----------+-------------------------  ----+     Summary:  Patent arteriovenous fistula with one competing branch and mild tortuosity  in  the mid to distal upper arm observed.  Incision:  well healed Extremities:  fistula has good thrill, radial pulse palpable Neuro: sensation intact Lungs non labored breathing    Assessment/Plan:  This is a 81 y.o. male who is s/p: right BC AV fistula the duplex demonstrates a patent fistula, but the diameter is small < 0.6 mid to proximal fistula with tortuosity and competing branches.    I will schedule him for fistula superficialization and branch ligation with DR. Donzetta Matters.     Roxy Horseman  PA-C [facility] [phone]   Clinic MD:  Donzetta Matters

## 2021-06-24 ENCOUNTER — Telehealth: Payer: Self-pay | Admitting: Licensed Clinical Social Worker

## 2021-06-24 DIAGNOSIS — R52 Pain, unspecified: Secondary | ICD-10-CM | POA: Diagnosis not present

## 2021-06-24 DIAGNOSIS — K5900 Constipation, unspecified: Secondary | ICD-10-CM | POA: Diagnosis not present

## 2021-06-24 DIAGNOSIS — N186 End stage renal disease: Secondary | ICD-10-CM | POA: Diagnosis not present

## 2021-06-24 DIAGNOSIS — Z992 Dependence on renal dialysis: Secondary | ICD-10-CM | POA: Diagnosis not present

## 2021-06-24 DIAGNOSIS — R2681 Unsteadiness on feet: Secondary | ICD-10-CM | POA: Diagnosis not present

## 2021-06-24 DIAGNOSIS — H353110 Nonexudative age-related macular degeneration, right eye, stage unspecified: Secondary | ICD-10-CM | POA: Diagnosis not present

## 2021-06-24 DIAGNOSIS — K219 Gastro-esophageal reflux disease without esophagitis: Secondary | ICD-10-CM | POA: Diagnosis not present

## 2021-06-24 DIAGNOSIS — M6259 Muscle wasting and atrophy, not elsewhere classified, multiple sites: Secondary | ICD-10-CM | POA: Diagnosis not present

## 2021-06-24 DIAGNOSIS — H543 Unqualified visual loss, both eyes: Secondary | ICD-10-CM | POA: Diagnosis not present

## 2021-06-24 NOTE — Progress Notes (Signed)
?Heart and Vascular Care Navigation ? ?06/24/2021 ? ?[place] ?[birth year] ?[id number] ? ?Reason for Referral:  ?Transportation to procedure on 5/19 ?Engaged with patient by telephone for initial visit for Heart and Vascular Care Coordination. ?                                                                                                  ?Assessment:                                     ?LCSW reached out to pt at [phone]. Reached pt wife Evangeline via that number. Pt wife shared that pt currently at a SNF [facility] They have been coordinating transportation- he may need assistance when he gets home as he is new dialysis and will have this procedure. She is hopeful that he will be discharged from SNF soon.  ? ?I shared pt should have ride benefits through his [facility]. He also is able to utilize [facility] as needed to get to appointments. I have mailed pt wife this information and shared I will try and reach out to her sooner to appt and get things sorted if she still needs help at that time.  ?HRT/VAS Care Coordination   ? ? Patients Home Cardiology Office [facility]  ? Outpatient Care Team Social Worker  ? Social Worker Name: Georges Lynch [facility] [phone]  ? Living arrangements for the past 2 months Single Family Home  ? Lives with: Facility Resident; Spouse  currently at SNF  ? Patient Current Insurance Coverage Managed Medicare  ? Patient Has Concern With Paying Medical Bills No  ? Does Patient Have Prescription Coverage? Yes  ? Home Assistive Devices/Equipment Walker (specify type)  ? ?  ? ? ?Social History:                                                                             ?SDOH Screenings  ? ?Alcohol Screen: Not on file  ?Depression (PHQ2-9): Not on file  ?Financial Resource Strain: Not on file  ?Food Insecurity: Not on file  ?Housing: Not on file  ?Physical Activity: Not on file  ?Social Connections: Not on file  ?Stress: Not on file  ?Tobacco Use: Low Risk   ?  Smoking Tobacco Use: Never  ? Smokeless Tobacco Use: Never  ? Passive Exposure: Never  ?Transportation Needs: No Transportation Needs  ? Lack of Transportation (Medical): No  ? Lack of Transportation (Non-Medical): No  ? ? ?SDOH Interventions: ?Transportation:   Transportation Interventions: Payor Benefit  ? ? ?Follow-up plan:   ?LCSW has mailed pt spouse information about transportation benefits through [facility], also mailed her [employer] [place] applications. Contacted Damita Dunnings [place] with scheduling at [facility] to let  him know the above. I will f/u closer to procedure to assess status.  ? ? ? ?

## 2021-06-25 DIAGNOSIS — N186 End stage renal disease: Secondary | ICD-10-CM | POA: Diagnosis not present

## 2021-06-25 DIAGNOSIS — D649 Anemia, unspecified: Secondary | ICD-10-CM | POA: Diagnosis not present

## 2021-06-25 DIAGNOSIS — I4891 Unspecified atrial fibrillation: Secondary | ICD-10-CM | POA: Diagnosis not present

## 2021-06-25 DIAGNOSIS — M109 Gout, unspecified: Secondary | ICD-10-CM | POA: Diagnosis not present

## 2021-06-25 DIAGNOSIS — G8190 Hemiplegia, unspecified affecting unspecified side: Secondary | ICD-10-CM | POA: Diagnosis not present

## 2021-06-25 DIAGNOSIS — Z992 Dependence on renal dialysis: Secondary | ICD-10-CM | POA: Diagnosis not present

## 2021-06-25 DIAGNOSIS — E118 Type 2 diabetes mellitus with unspecified complications: Secondary | ICD-10-CM | POA: Diagnosis not present

## 2021-06-25 DIAGNOSIS — N170 Acute kidney failure with tubular necrosis: Secondary | ICD-10-CM | POA: Diagnosis not present

## 2021-06-26 DIAGNOSIS — Z992 Dependence on renal dialysis: Secondary | ICD-10-CM | POA: Diagnosis not present

## 2021-06-26 DIAGNOSIS — N186 End stage renal disease: Secondary | ICD-10-CM | POA: Diagnosis not present

## 2021-06-27 DIAGNOSIS — N170 Acute kidney failure with tubular necrosis: Secondary | ICD-10-CM | POA: Diagnosis not present

## 2021-06-27 DIAGNOSIS — Z992 Dependence on renal dialysis: Secondary | ICD-10-CM | POA: Diagnosis not present

## 2021-06-27 DIAGNOSIS — N186 End stage renal disease: Secondary | ICD-10-CM | POA: Diagnosis not present

## 2021-06-29 DIAGNOSIS — N186 End stage renal disease: Secondary | ICD-10-CM | POA: Diagnosis not present

## 2021-06-29 DIAGNOSIS — Z992 Dependence on renal dialysis: Secondary | ICD-10-CM | POA: Diagnosis not present

## 2021-07-01 DIAGNOSIS — Z992 Dependence on renal dialysis: Secondary | ICD-10-CM | POA: Diagnosis not present

## 2021-07-01 DIAGNOSIS — N186 End stage renal disease: Secondary | ICD-10-CM | POA: Diagnosis not present

## 2021-07-03 DIAGNOSIS — N186 End stage renal disease: Secondary | ICD-10-CM | POA: Diagnosis not present

## 2021-07-03 DIAGNOSIS — Z992 Dependence on renal dialysis: Secondary | ICD-10-CM | POA: Diagnosis not present

## 2021-07-06 DIAGNOSIS — Z992 Dependence on renal dialysis: Secondary | ICD-10-CM | POA: Diagnosis not present

## 2021-07-06 DIAGNOSIS — N186 End stage renal disease: Secondary | ICD-10-CM | POA: Diagnosis not present

## 2021-07-07 DIAGNOSIS — R0602 Shortness of breath: Secondary | ICD-10-CM | POA: Insufficient documentation

## 2021-07-07 DIAGNOSIS — T7840XA Allergy, unspecified, initial encounter: Secondary | ICD-10-CM | POA: Insufficient documentation

## 2021-07-07 DIAGNOSIS — N2581 Secondary hyperparathyroidism of renal origin: Secondary | ICD-10-CM | POA: Insufficient documentation

## 2021-07-07 DIAGNOSIS — T782XXA Anaphylactic shock, unspecified, initial encounter: Secondary | ICD-10-CM | POA: Insufficient documentation

## 2021-07-08 DIAGNOSIS — N186 End stage renal disease: Secondary | ICD-10-CM | POA: Diagnosis not present

## 2021-07-08 DIAGNOSIS — N2581 Secondary hyperparathyroidism of renal origin: Secondary | ICD-10-CM | POA: Diagnosis not present

## 2021-07-08 DIAGNOSIS — Z992 Dependence on renal dialysis: Secondary | ICD-10-CM | POA: Diagnosis not present

## 2021-07-09 ENCOUNTER — Telehealth: Payer: Self-pay | Admitting: Licensed Clinical Social Worker

## 2021-07-09 NOTE — Telephone Encounter (Signed)
Called pt wife regarding pt ride for procedure on 5/19 w/ [facility] team. Was able to reach her at home phone [phone]. Re-introduced self, role, reason for call. She confirms pt has returned home, has transportation through [employer] to get to procedure. She has questions about the duration, I provided her with [facility] number and encouraged her to speak with RN at office since procedure length may vary based on pt needs/OR schedule. Pt wife will do so, has my number for any additional questions/concerns.  ? ?Westley Hummer, MSW, LCSW ?Clinical Social Worker II ?[facility] [facility] Care Navigation  ?[phone]- work cell phone (preferred) ?[phone]- desk phone ? ?

## 2021-07-10 DIAGNOSIS — Z992 Dependence on renal dialysis: Secondary | ICD-10-CM | POA: Diagnosis not present

## 2021-07-10 DIAGNOSIS — N2581 Secondary hyperparathyroidism of renal origin: Secondary | ICD-10-CM | POA: Diagnosis not present

## 2021-07-10 DIAGNOSIS — N186 End stage renal disease: Secondary | ICD-10-CM | POA: Diagnosis not present

## 2021-07-12 DIAGNOSIS — I5082 Biventricular heart failure: Secondary | ICD-10-CM | POA: Diagnosis not present

## 2021-07-12 DIAGNOSIS — E119 Type 2 diabetes mellitus without complications: Secondary | ICD-10-CM | POA: Diagnosis not present

## 2021-07-12 DIAGNOSIS — L989 Disorder of the skin and subcutaneous tissue, unspecified: Secondary | ICD-10-CM | POA: Diagnosis not present

## 2021-07-12 DIAGNOSIS — H543 Unqualified visual loss, both eyes: Secondary | ICD-10-CM | POA: Diagnosis not present

## 2021-07-12 DIAGNOSIS — N186 End stage renal disease: Secondary | ICD-10-CM | POA: Diagnosis not present

## 2021-07-13 DIAGNOSIS — N2581 Secondary hyperparathyroidism of renal origin: Secondary | ICD-10-CM | POA: Diagnosis not present

## 2021-07-13 DIAGNOSIS — N186 End stage renal disease: Secondary | ICD-10-CM | POA: Diagnosis not present

## 2021-07-13 DIAGNOSIS — Z992 Dependence on renal dialysis: Secondary | ICD-10-CM | POA: Diagnosis not present

## 2021-07-14 ENCOUNTER — Other Ambulatory Visit: Payer: Self-pay

## 2021-07-14 ENCOUNTER — Encounter (HOSPITAL_COMMUNITY): Payer: Self-pay | Admitting: Vascular Surgery

## 2021-07-14 NOTE — Progress Notes (Signed)
PCP - Dr. America Brown ? ?Cardiologist - Denies ? ?EP- Denies ? ?Endocrine- Denies ? ?Pulm- Denies ? ?Chest x-ray - 04/25/21 (E) ? ?EKG - 04/23/21 (E) ? ?Stress Test - Denies ? ?ECHO - 04/22/21 (E) ? ?Cardiac Cath - Denies ? ?AICD-na ?PM-na ?LOOP-na ? ?Nerve Stimulator- Denies ? ?Dialysis- [facility], had a full session on 5/17 ? ?Sleep Study - Denies ?CPAP - Denies ? ?LABS- 07/16/21: I-Stat 8 ? ?ASA- Cont. ? ?ERAS- No ? ?HA1C- 04/23/21(E): 4.8 ?Fasting Blood Sugar - N/A ?Checks Blood Sugar __0___ times a day- The pt is no longer on medicine, and he states he as never had to check levels at home. ? ?Anesthesia- No ? ?Pt denies having chest pain, sob, or fever during the pre-op phone call. All instructions explained to the pt, with a verbal understanding of the material including: as of today,  stop taking all Aspirin (unless instructed by your doctor) and Other Aspirin containing products, Vitamins, Fish oils, and Herbal medications. Also stop all NSAIDS i.e. Advil, Ibuprofen, Motrin, Aleve, Anaprox, Naproxen, BC, Goody Powders, and all Supplements.  Pt also instructed to wear a mask and social distance if he goes out. The opportunity to ask questions was provided.  ?

## 2021-07-15 DIAGNOSIS — Z992 Dependence on renal dialysis: Secondary | ICD-10-CM | POA: Diagnosis not present

## 2021-07-15 DIAGNOSIS — N186 End stage renal disease: Secondary | ICD-10-CM | POA: Diagnosis not present

## 2021-07-15 DIAGNOSIS — N2581 Secondary hyperparathyroidism of renal origin: Secondary | ICD-10-CM | POA: Diagnosis not present

## 2021-07-16 ENCOUNTER — Ambulatory Visit (HOSPITAL_COMMUNITY): Payer: Medicare PPO | Admitting: Anesthesiology

## 2021-07-16 ENCOUNTER — Encounter (HOSPITAL_COMMUNITY)
Admission: RE | Disposition: A | Discharge status: Home / Self Care | Payer: Self-pay | Source: Home / Self Care | Attending: Vascular Surgery

## 2021-07-16 ENCOUNTER — Ambulatory Visit (HOSPITAL_COMMUNITY)
Admission: RE | Admit: 2021-07-16 | Discharge: 2021-07-16 | Disposition: A | Discharge status: Home / Self Care | Payer: Medicare PPO | Attending: Vascular Surgery | Admitting: Vascular Surgery

## 2021-07-16 ENCOUNTER — Other Ambulatory Visit: Payer: Self-pay

## 2021-07-16 ENCOUNTER — Ambulatory Visit (HOSPITAL_BASED_OUTPATIENT_CLINIC_OR_DEPARTMENT_OTHER): Payer: Medicare PPO | Admitting: Anesthesiology

## 2021-07-16 DIAGNOSIS — T82590A Other mechanical complication of surgically created arteriovenous fistula, initial encounter: Secondary | ICD-10-CM | POA: Insufficient documentation

## 2021-07-16 DIAGNOSIS — Y832 Surgical operation with anastomosis, bypass or graft as the cause of abnormal reaction of the patient, or of later complication, without mention of misadventure at the time of the procedure: Secondary | ICD-10-CM | POA: Insufficient documentation

## 2021-07-16 DIAGNOSIS — I4891 Unspecified atrial fibrillation: Secondary | ICD-10-CM | POA: Insufficient documentation

## 2021-07-16 DIAGNOSIS — Z992 Dependence on renal dialysis: Secondary | ICD-10-CM | POA: Diagnosis not present

## 2021-07-16 DIAGNOSIS — H409 Unspecified glaucoma: Secondary | ICD-10-CM | POA: Diagnosis not present

## 2021-07-16 DIAGNOSIS — E1122 Type 2 diabetes mellitus with diabetic chronic kidney disease: Secondary | ICD-10-CM | POA: Diagnosis not present

## 2021-07-16 DIAGNOSIS — H547 Unspecified visual loss: Secondary | ICD-10-CM | POA: Diagnosis not present

## 2021-07-16 DIAGNOSIS — I509 Heart failure, unspecified: Secondary | ICD-10-CM | POA: Diagnosis not present

## 2021-07-16 DIAGNOSIS — N186 End stage renal disease: Secondary | ICD-10-CM | POA: Diagnosis not present

## 2021-07-16 DIAGNOSIS — I132 Hypertensive heart and chronic kidney disease with heart failure and with stage 5 chronic kidney disease, or end stage renal disease: Secondary | ICD-10-CM | POA: Diagnosis not present

## 2021-07-16 DIAGNOSIS — K219 Gastro-esophageal reflux disease without esophagitis: Secondary | ICD-10-CM | POA: Insufficient documentation

## 2021-07-16 DIAGNOSIS — Z79899 Other long term (current) drug therapy: Secondary | ICD-10-CM | POA: Insufficient documentation

## 2021-07-16 DIAGNOSIS — N185 Chronic kidney disease, stage 5: Secondary | ICD-10-CM | POA: Diagnosis not present

## 2021-07-16 DIAGNOSIS — I272 Pulmonary hypertension, unspecified: Secondary | ICD-10-CM | POA: Diagnosis not present

## 2021-07-16 HISTORY — DX: Unspecified macular degeneration: H35.30

## 2021-07-16 HISTORY — PX: FISTULA SUPERFICIALIZATION: SHX6341

## 2021-07-16 HISTORY — DX: Unspecified glaucoma: H40.9

## 2021-07-16 LAB — POCT I-STAT, CHEM 8
BUN: 15 mg/dL (ref 8–23)
Calcium, Ion: 0.94 mmol/L — ABNORMAL LOW (ref 1.15–1.40)
Chloride: 96 mmol/L — ABNORMAL LOW (ref 98–111)
Creatinine, Ser: 4.7 mg/dL — ABNORMAL HIGH (ref 0.61–1.24)
Glucose, Bld: 89 mg/dL (ref 70–99)
HCT: 34 % — ABNORMAL LOW (ref 39.0–52.0)
Hemoglobin: 11.6 g/dL — ABNORMAL LOW (ref 13.0–17.0)
Potassium: 2.8 mmol/L — ABNORMAL LOW (ref 3.5–5.1)
Sodium: 134 mmol/L — ABNORMAL LOW (ref 135–145)
TCO2: 28 mmol/L (ref 22–32)

## 2021-07-16 LAB — GLUCOSE, CAPILLARY
Glucose-Capillary: 73 mg/dL (ref 70–99)
Glucose-Capillary: 102 mg/dL — ABNORMAL HIGH (ref 70–99)

## 2021-07-16 SURGERY — FISTULA SUPERFICIALIZATION
Anesthesia: Regional | Site: Arm Upper | Laterality: Right

## 2021-07-16 MED ORDER — MIDAZOLAM HCL 2 MG/2ML IJ SOLN: 0.5000 mg | Freq: Once | INTRAMUSCULAR | Status: DC | PRN

## 2021-07-16 MED ORDER — FENTANYL CITRATE (PF) 100 MCG/2ML IJ SOLN
100.0000 ug | Freq: Once | INTRAMUSCULAR | Status: AC
  Filled 2021-07-16: qty 2

## 2021-07-16 MED ORDER — HEPARIN 6000 UNIT IRRIGATION SOLUTION
Status: DC | PRN
  Administered 2021-07-16: 1

## 2021-07-16 MED ORDER — MEPERIDINE HCL 25 MG/ML IJ SOLN: 6.2500 mg | INTRAMUSCULAR | Status: DC | PRN

## 2021-07-16 MED ORDER — PROPOFOL 10 MG/ML IV BOLUS
INTRAVENOUS | Status: DC | PRN
  Administered 2021-07-16: 50 mg via INTRAVENOUS

## 2021-07-16 MED ORDER — SODIUM CHLORIDE 0.9 % IV SOLN
INTRAVENOUS | Status: DC
  Administered 2021-07-16: 10 mL via INTRAVENOUS

## 2021-07-16 MED ORDER — PROPOFOL 500 MG/50ML IV EMUL
INTRAVENOUS | Status: DC | PRN
Start: 2021-07-16 — End: 2021-07-16
  Administered 2021-07-16: 75 ug/kg/min via INTRAVENOUS

## 2021-07-16 MED ORDER — ORAL CARE MOUTH RINSE: 15.0000 mL | Freq: Once | OROMUCOSAL | Status: AC

## 2021-07-16 MED ORDER — FENTANYL CITRATE (PF) 100 MCG/2ML IJ SOLN: 25.0000 ug | INTRAMUSCULAR | Status: DC | PRN

## 2021-07-16 MED ORDER — INSULIN ASPART 100 UNIT/ML IJ SOLN: 0.0000 [IU] | INTRAMUSCULAR | Status: DC | PRN

## 2021-07-16 MED ORDER — FENTANYL CITRATE (PF) 100 MCG/2ML IJ SOLN
INTRAMUSCULAR | Status: AC
  Administered 2021-07-16: 100 ug via INTRAVENOUS
  Filled 2021-07-16: qty 2

## 2021-07-16 MED ORDER — CHLORHEXIDINE GLUCONATE 4 % EX LIQD: 60.0000 mL | Freq: Once | CUTANEOUS | Status: DC

## 2021-07-16 MED ORDER — PHENYLEPHRINE HCL-NACL 20-0.9 MG/250ML-% IV SOLN
INTRAVENOUS | Status: DC | PRN
  Administered 2021-07-16: 20 ug/min via INTRAVENOUS

## 2021-07-16 MED ORDER — 0.9 % SODIUM CHLORIDE (POUR BTL) OPTIME
TOPICAL | Status: DC | PRN
  Administered 2021-07-16: 1000 mL

## 2021-07-16 MED ORDER — OXYCODONE HCL 5 MG PO TABS: 5.0000 mg | ORAL_TABLET | Freq: Once | ORAL | Status: DC | PRN

## 2021-07-16 MED ORDER — CEFAZOLIN SODIUM-DEXTROSE 2-4 GM/100ML-% IV SOLN
2.0000 g | INTRAVENOUS | Status: AC
  Administered 2021-07-16: 2 g via INTRAVENOUS
  Filled 2021-07-16: qty 100

## 2021-07-16 MED ORDER — LIDOCAINE HCL (PF) 1 % IJ SOLN
INTRAMUSCULAR | Status: AC
  Filled 2021-07-16: qty 30

## 2021-07-16 MED ORDER — LIDOCAINE-EPINEPHRINE (PF) 2 %-1:200000 IJ SOLN
INTRAMUSCULAR | Status: DC | PRN
  Administered 2021-07-16: 20 mL via PERINEURAL

## 2021-07-16 MED ORDER — HEPARIN 6000 UNIT IRRIGATION SOLUTION
Status: AC
  Filled 2021-07-16: qty 500

## 2021-07-16 MED ORDER — OXYCODONE HCL 5 MG/5ML PO SOLN: 5.0000 mg | Freq: Once | ORAL | Status: DC | PRN

## 2021-07-16 MED ORDER — ACETAMINOPHEN 500 MG PO TABS
1000.0000 mg | ORAL_TABLET | Freq: Once | ORAL | Status: AC
  Administered 2021-07-16: 1000 mg via ORAL
  Filled 2021-07-16: qty 2

## 2021-07-16 MED ORDER — CHLORHEXIDINE GLUCONATE 0.12 % MT SOLN
15.0000 mL | Freq: Once | OROMUCOSAL | Status: AC
  Administered 2021-07-16: 15 mL via OROMUCOSAL
  Filled 2021-07-16: qty 15

## 2021-07-16 MED ORDER — CHLORHEXIDINE GLUCONATE 4 % EX LIQD
60.0000 mL | Freq: Once | CUTANEOUS | Status: DC
Start: 2021-07-17 — End: 2021-07-16

## 2021-07-16 MED ORDER — TRAMADOL HCL 50 MG PO TABS: 50.0000 mg | ORAL_TABLET | Freq: Four times a day (QID) | ORAL | 0 refills | Status: DC | PRN

## 2021-07-16 SURGICAL SUPPLY — 37 items
ADH SKN CLS APL DERMABOND .7 (GAUZE/BANDAGES/DRESSINGS) ×1
ARMBAND PINK RESTRICT EXTREMIT (MISCELLANEOUS) ×3 IMPLANT
BAG COUNTER SPONGE SURGICOUNT (BAG) ×3 IMPLANT
BAG SPNG CNTER NS LX DISP (BAG) ×1
CANISTER SUCT 3000ML PPV (MISCELLANEOUS) ×3 IMPLANT
CLIP LIGATING EXTRA MED SLVR (CLIP) ×3 IMPLANT
CLIP LIGATING EXTRA SM BLUE (MISCELLANEOUS) ×3 IMPLANT
COVER PROBE W GEL 5X96 (DRAPES) ×4 IMPLANT
DERMABOND ADVANCED (GAUZE/BANDAGES/DRESSINGS) ×1
DERMABOND ADVANCED .7 DNX12 (GAUZE/BANDAGES/DRESSINGS) ×2 IMPLANT
ELECT REM PT RETURN 9FT ADLT (ELECTROSURGICAL) ×2
ELECTRODE REM PT RTRN 9FT ADLT (ELECTROSURGICAL) ×2 IMPLANT
GLOVE BIO SURGEON STRL SZ 6.5 (GLOVE) ×1 IMPLANT
GLOVE BIO SURGEON STRL SZ7.5 (GLOVE) ×4 IMPLANT
GLOVE BIOGEL PI IND STRL 7.0 (GLOVE) IMPLANT
GLOVE BIOGEL PI IND STRL 9 (GLOVE) IMPLANT
GLOVE BIOGEL PI INDICATOR 7.0 (GLOVE) ×1
GLOVE BIOGEL PI INDICATOR 9 (GLOVE) ×1
GOWN STRL REUS W/ TWL LRG LVL3 (GOWN DISPOSABLE) ×4 IMPLANT
GOWN STRL REUS W/ TWL XL LVL3 (GOWN DISPOSABLE) ×2 IMPLANT
GOWN STRL REUS W/TWL LRG LVL3 (GOWN DISPOSABLE) ×4
GOWN STRL REUS W/TWL XL LVL3 (GOWN DISPOSABLE) ×2
KIT BASIN OR (CUSTOM PROCEDURE TRAY) ×3 IMPLANT
KIT TURNOVER KIT B (KITS) ×3 IMPLANT
NS IRRIG 1000ML POUR BTL (IV SOLUTION) ×3 IMPLANT
PACK CV ACCESS (CUSTOM PROCEDURE TRAY) ×3 IMPLANT
PAD ARMBOARD 7.5X6 YLW CONV (MISCELLANEOUS) ×6 IMPLANT
SLING ARM FOAM STRAP LRG (SOFTGOODS) IMPLANT
SLING ARM FOAM STRAP MED (SOFTGOODS) IMPLANT
SUT MNCRL AB 4-0 PS2 18 (SUTURE) ×3 IMPLANT
SUT PROLENE 6 0 BV (SUTURE) ×4 IMPLANT
SUT SILK 2 0 SH (SUTURE) ×1 IMPLANT
SUT VIC AB 3-0 SH 27 (SUTURE) ×2
SUT VIC AB 3-0 SH 27X BRD (SUTURE) ×2 IMPLANT
TOWEL GREEN STERILE (TOWEL DISPOSABLE) ×3 IMPLANT
UNDERPAD 30X36 HEAVY ABSORB (UNDERPADS AND DIAPERS) ×3 IMPLANT
WATER STERILE IRR 1000ML POUR (IV SOLUTION) ×3 IMPLANT

## 2021-07-16 NOTE — Interval H&P Note (Signed)
History and Physical Interval Note:  07/16/2021 9:33 AM  Austin Wheeler  has presented today for surgery, with the diagnosis of ESRD.  The various methods of treatment have been discussed with the patient and family. After consideration of risks, benefits and other options for treatment, the patient has consented to  Procedure(s): RIGHT UPPER EXTREMITY FISTULA SUPERFICIALIZATION WITH BRANCH LIGATION (Right) as a surgical intervention.  The patient's history has been reviewed, patient examined, no change in status, stable for surgery.  I have reviewed the patient's chart and labs.  Questions were answered to the patient's satisfaction.     [name]

## 2021-07-16 NOTE — Anesthesia Preprocedure Evaluation (Addendum)
Anesthesia Evaluation  Patient identified by MRN, date of birth, ID band Patient awake    Reviewed: Allergy & Precautions, NPO status , Patient's Chart, lab work & pertinent test results  History of Anesthesia Complications (+) history of anesthetic complications (urinary retention)  Airway Mallampati: I  TM Distance: >3 FB Neck ROM: Full    Dental  (+) Poor Dentition, Dental Advisory Given   Pulmonary neg pulmonary ROS,    breath sounds clear to auscultation       Cardiovascular hypertension, Pt. on medications pulmonary hypertension(-) angina+CHF  + dysrhythmias Atrial Fibrillation  Rhythm:Irregular Rate:Normal  03/2021 ECHO:  1. EF 55 to 60%. The LV has normal function, no regional  wall motion abnormalities. The left ventricular internal cavity size was moderately dilated. There is the interventricular septum is flattened in systole and diastole, consistent with right ventricular pressure and volume overload.  2. Right ventricular systolic function is moderately reduced. The RV size is severely enlarged. There is moderately elevated pulmonary artery systolic pressure. 3. Left atrial size was mildly dilated.  4. Right atrial size was severely dilated.  5. Moderate pleural effusion  6. Mild MR 7. Mod-severe TR   Neuro/Psych Glaucoma: blind    GI/Hepatic Neg liver ROS, GERD  Medicated and Controlled,  Endo/Other  diabetes (glu 73)  Renal/GU ESRF and DialysisRenal disease (TuThSa, K+ 2.8)     Musculoskeletal   Abdominal   Peds  Hematology eliquis   Anesthesia Other Findings   Reproductive/Obstetrics                            Anesthesia Physical Anesthesia Plan  ASA: 3  Anesthesia Plan: Regional   Post-op Pain Management: Tylenol PO (pre-op)*   Induction:   PONV Risk Score and Plan: 1 and Treatment may vary due to age or medical condition  Airway Management Planned: Natural Airway  and Simple Face Mask  Additional Equipment: None  Intra-op Plan:   Post-operative Plan:   Informed Consent: I have reviewed the patients History and Physical, chart, labs and discussed the procedure including the risks, benefits and alternatives for the proposed anesthesia with the patient or authorized representative who has indicated his/her understanding and acceptance.     Dental advisory given  Plan Discussed with: CRNA and Surgeon  Anesthesia Plan Comments: (Plan routine monitors, MAC with supraclavicular block)       Anesthesia Quick Evaluation

## 2021-07-16 NOTE — Discharge Instructions (Signed)
[facility]  Discharge Instructions  AV Fistula or Graft Surgery for Dialysis Access  Please refer to the following instructions for your post-procedure care. Your surgeon or physician assistant will discuss any changes with you.  Activity  You may drive the day following your surgery, if you are comfortable and no longer taking prescription pain medication. Resume full activity as the soreness in your incision resolves.  Bathing/Showering  You may shower after you go home. Keep your incision dry for 48 hours. Do not soak in a bathtub, hot tub, or swim until the incision heals completely. You may not shower if you have a hemodialysis catheter.  Incision Care  Clean your incision with mild soap and water after 48 hours. Pat the area dry with a clean towel. You do not need a bandage unless otherwise instructed. Do not apply any ointments or creams to your incision. You may have skin glue on your incision. Do not peel it off. It will come off on its own in about one week. Your arm may swell a bit after surgery. To reduce swelling use pillows to elevate your arm so it is above your heart. Your doctor will tell you if you need to lightly wrap your arm with an ACE bandage.  Diet  Resume your normal diet. There are not special food restrictions following this procedure. In order to heal from your surgery, it is CRITICAL to get adequate nutrition. Your body requires vitamins, minerals, and protein. Vegetables are the best source of vitamins and minerals. Vegetables also provide the perfect balance of protein. Processed food has little nutritional value, so try to avoid this.  Medications  Resume taking all of your medications. If your incision is causing pain, you may take over-the counter pain relievers such as acetaminophen (Tylenol). If you were prescribed a stronger pain medication, please be aware these medications can cause nausea and constipation. Prevent  nausea by taking the medication with a snack or meal. Avoid constipation by drinking plenty of fluids and eating foods with high amount of fiber, such as fruits, vegetables, and grains.  Do not take Tylenol if you are taking prescription pain medications.  Follow up Your surgeon may want to see you in the office following your access surgery. If so, this will be arranged at the time of your surgery.  Please call us immediately for any of the following conditions:  Increased pain, redness, drainage (pus) from your incision site Fever of 101 degrees or higher Severe or worsening pain at your incision site Hand pain or numbness.  Reduce your risk of vascular disease:  Stop smoking. If you would like help, call [facility] at 1-800-QUIT-NOW 236-107-4560) or [place] at [place] your cholesterol Maintain a desired weight Control your diabetes Keep your blood pressure down  Dialysis  It will take several weeks to several months for your new dialysis access to be ready for use. Your surgeon will determine when it is okay to use it. Your nephrologist will continue to direct your dialysis. You can continue to use your Permcath until your new access is ready for use.   07/16/2021 Austin Wheeler [id number] [birth year]  Surgeon(s): Waynetta Sandy, MD  Procedure(s): RIGHT UPPER EXTREMITY FISTULA SUPERFICIALIZATION WITH BRANCH LIGATION   May stick graft immediately   May stick graft on designated area only:   X Do not stick Right AVF for 6 weeks    If you have any questions, please call the  office at [phone].

## 2021-07-16 NOTE — Anesthesia Postprocedure Evaluation (Signed)
Anesthesia Post Note  Patient: Austin Wheeler  Procedure(s) Performed: RIGHT UPPER EXTREMITY FISTULA SUPERFICIALIZATION WITH BRANCH LIGATION (Right: Arm Upper)     Patient location during evaluation: PACU Anesthesia Type: Regional Level of consciousness: awake and alert, patient cooperative and oriented Pain management: pain level controlled Vital Signs Assessment: post-procedure vital signs reviewed and stable Respiratory status: spontaneous breathing, nonlabored ventilation and respiratory function stable Cardiovascular status: blood pressure returned to baseline and stable Postop Assessment: no apparent nausea or vomiting and able to ambulate Anesthetic complications: no   No notable events documented.  Last Vitals:  Vitals:   07/16/21 1155 07/16/21 1210  BP: 104/73 107/82  Pulse: 64 75  Resp: 19 (!) 21  Temp:  (!) 36.3 C  SpO2: 98% 97%    Last Pain:  Vitals:   07/16/21 1210  TempSrc:   PainSc: 0-No pain                 Cephas Revard,E. Kendyl Bissonnette

## 2021-07-16 NOTE — Transfer of Care (Signed)
Immediate Anesthesia Transfer of Care Note  Patient: Austin Wheeler  Procedure(s) Performed: RIGHT UPPER EXTREMITY FISTULA SUPERFICIALIZATION WITH BRANCH LIGATION (Right: Arm Upper)  Patient Location: PACU  Anesthesia Type:MAC combined with regional for post-op pain  Level of Consciousness: sedated  Airway & Oxygen Therapy: Patient Spontanous Breathing and Patient connected to nasal cannula oxygen  Post-op Assessment: Report given to RN and Post -op Vital signs reviewed and stable  Post vital signs: Reviewed and stable  Last Vitals:  Vitals Value Taken Time  BP    Temp    Pulse 71 07/16/21 1141  Resp 18 07/16/21 1141  SpO2 100 % 07/16/21 1141  Vitals shown include unvalidated device data.  Last Pain:  Vitals:   07/16/21 0923  TempSrc:   PainSc: 0-No pain      Patients Stated Pain Goal: 2 (34/96/11 6435)  Complications: No notable events documented.

## 2021-07-16 NOTE — Anesthesia Procedure Notes (Signed)
Anesthesia Regional Block: Supraclavicular block   Pre-Anesthetic Checklist: , timeout performed,  Correct Patient, Correct Site, Correct Laterality,  Correct Procedure, Correct Position, site marked,  Risks and benefits discussed,  Surgical consent,  Pre-op evaluation,  At surgeon's request and post-op pain management  Laterality: Right and Upper  Prep: chloraprep       Needles:  Injection technique: Single-shot  Needle Type: Echogenic Needle     Needle Length: 9cm  Needle Gauge: 21     Additional Needles:   Procedures:,,,, ultrasound used (permanent image in chart),,    Narrative:  Start time: 07/16/2021 9:37 AM End time: 07/16/2021 9:43 AM Injection made incrementally with aspirations every 5 mL.  Performed by: Personally  Anesthesiologist: Annye Asa, MD  Additional Notes: Pt identified in Holding room.  Monitors applied. Working IV access confirmed. Sterile prep R clavicle and neck.  #21ga ECHOgenic Arrow block needle to supraclav brachial plexus with US guidance.  20cc 2% Lidocaine 1:200k epi injected incrementally after negative test dose.  Patient asymptomatic, VSS, no heme aspirated, tolerated well.   Jenita Seashore, MD

## 2021-07-16 NOTE — Op Note (Signed)
    Patient name: Austin Wheeler MRN: [id number] DOB: 11/27/1940 Sex: male  07/16/2021 Pre-operative Diagnosis: esrd Post-operative diagnosis:  Same Surgeon:  Erlene Quan C. Donzetta Matters, MD Assistant: Paulo Fruit, PA Procedure Performed:  Revision right arm cephalic vein AV fistula with transposition  Indications: 81 year old male on dialysis via catheter has a right arm cephalic vein fistula which is failed to mature and has multiple branches serpiginous by physical exam.  He is now indicated for revision with superficialization versus transposition.  Findings: Fistula itself did have multiple branches these were identified on the table with ultrasound.  2 incisions were used to mobilize the fistula throughout the upper arm divided the branches between ties I then transposed it laterally in an effort to straighten out at completion there was very strong flow through the fistula and a palpable radial artery pulse at the wrist.   Procedure:  The patient was identified in the holding area and taken to the operating room where is placed supine on the operative table and MAC anesthesia was induced.  A preoperative block of been placed.  He was sterilely prepped draped in the right upper extremity, antibiotics were minister timeout was called.  The block was checked was noted to be intact.  The fistula was traced throughout his upper arm with ultrasound and branches were marked.  2 longitudinal incisions were then made 1 above the antecubital 1 towards the axilla.  The entire to the fistula was dissected free and branches were divided between ties.  The patient was marked for orientation.  I clamped it near the antecubitum and transected and tunneled laterally maintaining orientation.  Was again flushed with heparinized saline and reclamped.  Both ends were spatulated and sewn in and with 6-0 Prolene suture.  Upon completion there was a very strong thrill in the fistula.  We did free up some of the soft tissue around  this and then closed some soft tissue deep to the fistula at the cephalad arm.  Incisions were then closed with 4-0 Monocryl.  Dermabond is placed to level the skin.  There was a palpable radial artery pulse at completion.  He was then awakened from anesthesia having tolerated procedure without any complication.  All counts were correct at completion.  Given the complexity of the case,  the assistant was necessary in order to expedite the procedure and safely perform the technical aspects of the operation.  The assistant was specifically required to help with tension and counter tension but dividing venous branches and creating anastomosis.  These skills, especially following the Prolene suture for the anastomosis, could not have been adequately performed by a scrub tech assistant.   EBL: 50cc   Thersia Petraglia C. Donzetta Matters, MD [facility] Office: [phone] Pager: [phone]

## 2021-07-17 ENCOUNTER — Encounter (HOSPITAL_COMMUNITY): Payer: Self-pay | Admitting: Vascular Surgery

## 2021-07-17 DIAGNOSIS — N186 End stage renal disease: Secondary | ICD-10-CM | POA: Diagnosis not present

## 2021-07-17 DIAGNOSIS — Z992 Dependence on renal dialysis: Secondary | ICD-10-CM | POA: Diagnosis not present

## 2021-07-17 DIAGNOSIS — N2581 Secondary hyperparathyroidism of renal origin: Secondary | ICD-10-CM | POA: Diagnosis not present

## 2021-07-19 ENCOUNTER — Telehealth: Payer: Self-pay

## 2021-07-19 NOTE — Telephone Encounter (Signed)
Patient's wife called in stating patient had some bleeding from incision site overnight and also has developed swelling.Pt denies pains, loss of motion, excessive coldness and/or dark discoloration of hand/arm. I advised that due to the pt being on blood thinners (Eliquis and ASA) a small amount of bloody drainage is to be expected, I advised to place a light dressing on the incision, also for the swelling to elevate the arm for short periods of time or as much as pt can tolerate. I advised if the bleeding get worse to proceed to the ED. Wife voiced her understanding.

## 2021-07-20 DIAGNOSIS — Z992 Dependence on renal dialysis: Secondary | ICD-10-CM | POA: Diagnosis not present

## 2021-07-20 DIAGNOSIS — N186 End stage renal disease: Secondary | ICD-10-CM | POA: Diagnosis not present

## 2021-07-20 DIAGNOSIS — N2581 Secondary hyperparathyroidism of renal origin: Secondary | ICD-10-CM | POA: Diagnosis not present

## 2021-07-22 DIAGNOSIS — Z992 Dependence on renal dialysis: Secondary | ICD-10-CM | POA: Diagnosis not present

## 2021-07-22 DIAGNOSIS — N186 End stage renal disease: Secondary | ICD-10-CM | POA: Diagnosis not present

## 2021-07-22 DIAGNOSIS — N2581 Secondary hyperparathyroidism of renal origin: Secondary | ICD-10-CM | POA: Diagnosis not present

## 2021-07-24 DIAGNOSIS — Z992 Dependence on renal dialysis: Secondary | ICD-10-CM | POA: Diagnosis not present

## 2021-07-24 DIAGNOSIS — N2581 Secondary hyperparathyroidism of renal origin: Secondary | ICD-10-CM | POA: Diagnosis not present

## 2021-07-24 DIAGNOSIS — N186 End stage renal disease: Secondary | ICD-10-CM | POA: Diagnosis not present

## 2021-07-27 DIAGNOSIS — N186 End stage renal disease: Secondary | ICD-10-CM | POA: Diagnosis not present

## 2021-07-27 DIAGNOSIS — N2581 Secondary hyperparathyroidism of renal origin: Secondary | ICD-10-CM | POA: Diagnosis not present

## 2021-07-27 DIAGNOSIS — Z992 Dependence on renal dialysis: Secondary | ICD-10-CM | POA: Diagnosis not present

## 2021-07-28 DIAGNOSIS — N186 End stage renal disease: Secondary | ICD-10-CM | POA: Diagnosis not present

## 2021-07-28 DIAGNOSIS — Z992 Dependence on renal dialysis: Secondary | ICD-10-CM | POA: Diagnosis not present

## 2021-07-28 DIAGNOSIS — N170 Acute kidney failure with tubular necrosis: Secondary | ICD-10-CM | POA: Diagnosis not present

## 2021-07-29 DIAGNOSIS — Z992 Dependence on renal dialysis: Secondary | ICD-10-CM | POA: Diagnosis not present

## 2021-07-29 DIAGNOSIS — N2581 Secondary hyperparathyroidism of renal origin: Secondary | ICD-10-CM | POA: Diagnosis not present

## 2021-07-29 DIAGNOSIS — N186 End stage renal disease: Secondary | ICD-10-CM | POA: Diagnosis not present

## 2021-07-31 DIAGNOSIS — N186 End stage renal disease: Secondary | ICD-10-CM | POA: Diagnosis not present

## 2021-07-31 DIAGNOSIS — Z992 Dependence on renal dialysis: Secondary | ICD-10-CM | POA: Diagnosis not present

## 2021-07-31 DIAGNOSIS — N2581 Secondary hyperparathyroidism of renal origin: Secondary | ICD-10-CM | POA: Diagnosis not present

## 2021-08-03 DIAGNOSIS — Z992 Dependence on renal dialysis: Secondary | ICD-10-CM | POA: Diagnosis not present

## 2021-08-03 DIAGNOSIS — N186 End stage renal disease: Secondary | ICD-10-CM | POA: Diagnosis not present

## 2021-08-03 DIAGNOSIS — N2581 Secondary hyperparathyroidism of renal origin: Secondary | ICD-10-CM | POA: Diagnosis not present

## 2021-08-04 NOTE — Progress Notes (Deleted)
Duplicate note- error

## 2021-08-05 DIAGNOSIS — N2581 Secondary hyperparathyroidism of renal origin: Secondary | ICD-10-CM | POA: Diagnosis not present

## 2021-08-05 DIAGNOSIS — Z992 Dependence on renal dialysis: Secondary | ICD-10-CM | POA: Diagnosis not present

## 2021-08-05 DIAGNOSIS — N186 End stage renal disease: Secondary | ICD-10-CM | POA: Diagnosis not present

## 2021-08-07 DIAGNOSIS — N2581 Secondary hyperparathyroidism of renal origin: Secondary | ICD-10-CM | POA: Diagnosis not present

## 2021-08-07 DIAGNOSIS — N186 End stage renal disease: Secondary | ICD-10-CM | POA: Diagnosis not present

## 2021-08-07 DIAGNOSIS — Z992 Dependence on renal dialysis: Secondary | ICD-10-CM | POA: Diagnosis not present

## 2021-08-10 DIAGNOSIS — Z992 Dependence on renal dialysis: Secondary | ICD-10-CM | POA: Diagnosis not present

## 2021-08-10 DIAGNOSIS — N186 End stage renal disease: Secondary | ICD-10-CM | POA: Diagnosis not present

## 2021-08-10 DIAGNOSIS — N2581 Secondary hyperparathyroidism of renal origin: Secondary | ICD-10-CM | POA: Diagnosis not present

## 2021-08-11 ENCOUNTER — Ambulatory Visit (INDEPENDENT_AMBULATORY_CARE_PROVIDER_SITE_OTHER): Payer: Medicare PPO | Admitting: Physician Assistant

## 2021-08-11 VITALS — BP 156/111 | HR 104 | Temp 98.0°F | Resp 14 | Ht 77.0 in | Wt 210.4 lb

## 2021-08-11 DIAGNOSIS — N186 End stage renal disease: Secondary | ICD-10-CM

## 2021-08-11 DIAGNOSIS — Z992 Dependence on renal dialysis: Secondary | ICD-10-CM

## 2021-08-11 NOTE — Progress Notes (Signed)
    Postoperative Access Visit   History of Present Illness   Austin Wheeler is a 81 y.o. year old male who presents for postoperative follow-up for: Right upper extremity basilic vein transposition and branch ligation by Dr. Donzetta Matters on 07/16/21.The patient's wounds are not fully healed. The distal arm incision has some slight skin separation and occasional spotty bleeding, so he keeps it covered with a gauze bandage. There has been no pus-like discharge. The patient denies any fevers at home. He denies cleaning his incisions at all since the surgery. The patient notes no steal symptoms.  The patient is able to complete their activities of daily living.  The patient's current symptoms are: some right arm pain at night, rated a 5/10. He reports taking some Tylenol for pain relief.  He currently dialyzes via right IJ TDC on TTS at [place]:   08/11/21 1144  BP: (!) 156/111  Pulse: (!) 104  Resp: 14  Temp: 98 F (36.7 C)  TempSrc: Temporal  SpO2: 96%  Height: '6\' 5"'$  (1.956 m)   Body mass index is 28.46 kg/m.  right arm Proximal incision is healed. Distal incision with some skin separation and bleeding. Evidence of a resolving hematoma. Palpable radial pulse, hand grip is 5/5, sensation in digits is intact, palpable thrill, bruit can be auscultated     Medical Decision Making   Austin Wheeler is a 81 y.o. year old male who presents s/p Right upper extremity basilic vein transposition and branch ligation by Dr. Donzetta Matters on 07/16/21.  Patent tunneled fistula without signs or symptoms of steal syndrome Evidence of resolving hematoma at distal incision site. Some skin separation and bleeding of distal incision. No evidence of infection. Patient has denied fevers or discharge. Educated the patient that he can gently clean the incision sites with warm water and mild soap. The patient's access cannot be used until resolution of hematoma and full incision  healing. The patient's tunneled dialysis catheter can be removed when Nephrology is comfortable with the performance of the right BV AV fistula The patient will be scheduled for a wound check on 08/23/2021   Vicente Serene, PA-C [facility] [place] Office: [phone]  Clinic MD: [place]/ Donzetta Matters

## 2021-08-12 DIAGNOSIS — N186 End stage renal disease: Secondary | ICD-10-CM | POA: Diagnosis not present

## 2021-08-12 DIAGNOSIS — Z992 Dependence on renal dialysis: Secondary | ICD-10-CM | POA: Diagnosis not present

## 2021-08-12 DIAGNOSIS — N2581 Secondary hyperparathyroidism of renal origin: Secondary | ICD-10-CM | POA: Diagnosis not present

## 2021-08-14 DIAGNOSIS — N2581 Secondary hyperparathyroidism of renal origin: Secondary | ICD-10-CM | POA: Diagnosis not present

## 2021-08-14 DIAGNOSIS — Z992 Dependence on renal dialysis: Secondary | ICD-10-CM | POA: Diagnosis not present

## 2021-08-14 DIAGNOSIS — N186 End stage renal disease: Secondary | ICD-10-CM | POA: Diagnosis not present

## 2021-08-16 NOTE — Progress Notes (Unsigned)
POST OPERATIVE OFFICE NOTE    CC:  F/u for surgery  HPI:  This is a 81 y.o. male who is s/p revision of right arm BC AVF with transposition on 07/16/2021 by Dr. Donzetta Matters.  His original fistula creation was 05/13/2021 also by Dr. Donzetta Matters.    He was last seen on 08/11/2021 to check on his incisions that were not completely healed.  He was not having any fevers.  There was no purulent drainage.  His hematoma appeared to be resolving and there was no evidence of infection.    Pt states ***he does *** have pain/numbness in *** hand.    The pt is on dialysis T/T/S at [place] location.   Allergies  Allergen Reactions   Guaifenesin Swelling and Rash    Hand and feet swelling from Robitussin   Influenza Vaccines Other (See Comments)    Caused flu-like illness   Lexapro [Escitalopram] Nausea And Vomiting    Reported by [facility] - pt does not recall    Current Outpatient Medications  Medication Sig Dispense Refill   acetaminophen (TYLENOL) 500 MG tablet Take 500 mg by mouth daily as needed for headache (pain).     apixaban (ELIQUIS) 2.5 MG TABS tablet Take 1 tablet (2.5 mg total) by mouth 2 (two) times daily. 60 tablet    aspirin EC 81 MG tablet 81 mg every morning.     atropine 1 % ophthalmic solution Place 1 drop into the right eye at bedtime.     brimonidine (ALPHAGAN) 0.2 % ophthalmic solution Place 1 drop into the left eye 3 (three) times daily.     dorzolamide-timolol (COSOPT) 22.3-6.8 MG/ML ophthalmic solution Place 1 drop into the left eye 2 (two) times daily.     doxazosin (CARDURA) 8 MG tablet Take 1 tablet (8 mg total) by mouth daily. (Patient taking differently: Take 8 mg by mouth at bedtime.) 90 tablet 3   dutasteride (AVODART) 0.5 MG capsule Take 1 capsule (0.5 mg total) by mouth every Monday, Wednesday, and Friday. 12 capsule 0   feeding supplement (ENSURE ENLIVE / ENSURE PLUS) LIQD Take 237 mLs by mouth 3 (three) times daily between meals. 237 mL 12   latanoprost  (XALATAN) 0.005 % ophthalmic solution Place 1 drop into the left eye at bedtime.     Multiple Vitamin (MULTIVITAMIN WITH MINERALS) TABS tablet Take 1 tablet by mouth daily. 30 tablet 0   multivitamin (RENA-VIT) TABS tablet Take 1 tablet by mouth daily.     Nutritional Supplements (FEEDING SUPPLEMENT, NEPRO CARB STEADY,) LIQD 237 mLs 3 (three) times daily with meals.     olmesartan-hydrochlorothiazide (BENICAR HCT) 40-12.5 MG tablet Take 1 tablet by mouth at bedtime.     pantoprazole (PROTONIX) 40 MG tablet Take 1 tablet (40 mg total) by mouth daily. 30 tablet 0   pilocarpine (PILOCAR) 4 % ophthalmic solution Place 1 drop into the left eye 3 (three) times daily.  11   polyethylene glycol (MIRALAX / GLYCOLAX) 17 g packet Take 17 g by mouth daily. (Patient taking differently: Take 17 g by mouth daily as needed for mild constipation or moderate constipation.) 14 each 0   prednisoLONE acetate (PRED FORTE) 1 % ophthalmic suspension Place 1 drop into the right eye at bedtime.     senna-docusate (SENOKOT-S) 8.6-50 MG tablet Take 1 tablet by mouth 2 (two) times daily.     traMADol (ULTRAM) 50 MG tablet Take 1 tablet (50 mg total) by mouth every 6 (six) hours as  needed. 16 tablet 0   No current facility-administered medications for this visit.     ROS:  See HPI  Physical Exam:  ***  Incision:  *** Extremities:   There *** a palpable *** pulse.   Motor and sensory *** in tact.   There *** a thrill/bruit present.  The fistula/graft *** easily palpable   Dialysis Duplex on 08/16/2021: Diameter:  *** Depth:  ***   Assessment/Plan:  This is a 81 y.o. male who is s/p: evision of right arm BC AVF with transposition on 07/16/2021 by Dr. Donzetta Matters.  His original fistula creation was 05/13/2021 also by Dr. Donzetta Matters.    -the pt does *** have evidence of steal. -the fistula/graft can be used ***. -if pt has tunneled catheter, this can be removed at the discretion of the dialysis center once the fistula/graft  has been accessed to their satisfaction.  -discussed with pt that access does not last forever and will need intervention or even new access at some point.  -the pt will follow up ***   Leontine Locket, [facility] [phone]  Clinic MD:  Trula Slade

## 2021-08-17 DIAGNOSIS — Z992 Dependence on renal dialysis: Secondary | ICD-10-CM | POA: Diagnosis not present

## 2021-08-17 DIAGNOSIS — N2581 Secondary hyperparathyroidism of renal origin: Secondary | ICD-10-CM | POA: Diagnosis not present

## 2021-08-17 DIAGNOSIS — N186 End stage renal disease: Secondary | ICD-10-CM | POA: Diagnosis not present

## 2021-08-19 DIAGNOSIS — N186 End stage renal disease: Secondary | ICD-10-CM | POA: Diagnosis not present

## 2021-08-19 DIAGNOSIS — Z992 Dependence on renal dialysis: Secondary | ICD-10-CM | POA: Diagnosis not present

## 2021-08-19 DIAGNOSIS — N2581 Secondary hyperparathyroidism of renal origin: Secondary | ICD-10-CM | POA: Diagnosis not present

## 2021-08-21 DIAGNOSIS — N186 End stage renal disease: Secondary | ICD-10-CM | POA: Diagnosis not present

## 2021-08-21 DIAGNOSIS — Z992 Dependence on renal dialysis: Secondary | ICD-10-CM | POA: Diagnosis not present

## 2021-08-21 DIAGNOSIS — N2581 Secondary hyperparathyroidism of renal origin: Secondary | ICD-10-CM | POA: Diagnosis not present

## 2021-08-23 ENCOUNTER — Ambulatory Visit (INDEPENDENT_AMBULATORY_CARE_PROVIDER_SITE_OTHER): Payer: Medicare PPO | Admitting: Physician Assistant

## 2021-08-23 VITALS — BP 146/101 | HR 101 | Temp 96.1°F | Resp 16 | Ht 77.0 in | Wt 225.0 lb

## 2021-08-23 DIAGNOSIS — Z992 Dependence on renal dialysis: Secondary | ICD-10-CM

## 2021-08-23 DIAGNOSIS — N186 End stage renal disease: Secondary | ICD-10-CM

## 2021-08-24 DIAGNOSIS — N2581 Secondary hyperparathyroidism of renal origin: Secondary | ICD-10-CM | POA: Diagnosis not present

## 2021-08-24 DIAGNOSIS — N186 End stage renal disease: Secondary | ICD-10-CM | POA: Diagnosis not present

## 2021-08-24 DIAGNOSIS — Z992 Dependence on renal dialysis: Secondary | ICD-10-CM | POA: Diagnosis not present

## 2021-08-26 DIAGNOSIS — Z992 Dependence on renal dialysis: Secondary | ICD-10-CM | POA: Diagnosis not present

## 2021-08-26 DIAGNOSIS — N2581 Secondary hyperparathyroidism of renal origin: Secondary | ICD-10-CM | POA: Diagnosis not present

## 2021-08-26 DIAGNOSIS — N186 End stage renal disease: Secondary | ICD-10-CM | POA: Diagnosis not present

## 2021-08-27 ENCOUNTER — Other Ambulatory Visit: Payer: Self-pay | Admitting: *Deleted

## 2021-08-27 ENCOUNTER — Other Ambulatory Visit: Payer: Self-pay

## 2021-08-27 DIAGNOSIS — N170 Acute kidney failure with tubular necrosis: Secondary | ICD-10-CM | POA: Diagnosis not present

## 2021-08-27 DIAGNOSIS — N186 End stage renal disease: Secondary | ICD-10-CM

## 2021-08-27 DIAGNOSIS — Z992 Dependence on renal dialysis: Secondary | ICD-10-CM | POA: Diagnosis not present

## 2021-08-28 DIAGNOSIS — Z992 Dependence on renal dialysis: Secondary | ICD-10-CM | POA: Diagnosis not present

## 2021-08-28 DIAGNOSIS — N186 End stage renal disease: Secondary | ICD-10-CM | POA: Diagnosis not present

## 2021-08-28 DIAGNOSIS — N2581 Secondary hyperparathyroidism of renal origin: Secondary | ICD-10-CM | POA: Diagnosis not present

## 2021-08-31 DIAGNOSIS — N2581 Secondary hyperparathyroidism of renal origin: Secondary | ICD-10-CM | POA: Diagnosis not present

## 2021-08-31 DIAGNOSIS — N186 End stage renal disease: Secondary | ICD-10-CM | POA: Diagnosis not present

## 2021-08-31 DIAGNOSIS — Z992 Dependence on renal dialysis: Secondary | ICD-10-CM | POA: Diagnosis not present

## 2021-09-02 DIAGNOSIS — H31012 Macula scars of posterior pole (postinflammatory) (post-traumatic), left eye: Secondary | ICD-10-CM | POA: Diagnosis not present

## 2021-09-02 DIAGNOSIS — N2581 Secondary hyperparathyroidism of renal origin: Secondary | ICD-10-CM | POA: Diagnosis not present

## 2021-09-02 DIAGNOSIS — Z961 Presence of intraocular lens: Secondary | ICD-10-CM | POA: Diagnosis not present

## 2021-09-02 DIAGNOSIS — H2511 Age-related nuclear cataract, right eye: Secondary | ICD-10-CM | POA: Diagnosis not present

## 2021-09-02 DIAGNOSIS — Z992 Dependence on renal dialysis: Secondary | ICD-10-CM | POA: Diagnosis not present

## 2021-09-02 DIAGNOSIS — H4051X3 Glaucoma secondary to other eye disorders, right eye, severe stage: Secondary | ICD-10-CM | POA: Diagnosis not present

## 2021-09-02 DIAGNOSIS — N186 End stage renal disease: Secondary | ICD-10-CM | POA: Diagnosis not present

## 2021-09-02 DIAGNOSIS — H401133 Primary open-angle glaucoma, bilateral, severe stage: Secondary | ICD-10-CM | POA: Diagnosis not present

## 2021-09-04 DIAGNOSIS — N2581 Secondary hyperparathyroidism of renal origin: Secondary | ICD-10-CM | POA: Diagnosis not present

## 2021-09-04 DIAGNOSIS — Z992 Dependence on renal dialysis: Secondary | ICD-10-CM | POA: Diagnosis not present

## 2021-09-04 DIAGNOSIS — N186 End stage renal disease: Secondary | ICD-10-CM | POA: Diagnosis not present

## 2021-09-06 DIAGNOSIS — H4051X3 Glaucoma secondary to other eye disorders, right eye, severe stage: Secondary | ICD-10-CM | POA: Diagnosis not present

## 2021-09-06 DIAGNOSIS — H401133 Primary open-angle glaucoma, bilateral, severe stage: Secondary | ICD-10-CM | POA: Diagnosis not present

## 2021-09-07 DIAGNOSIS — Z992 Dependence on renal dialysis: Secondary | ICD-10-CM | POA: Diagnosis not present

## 2021-09-07 DIAGNOSIS — N186 End stage renal disease: Secondary | ICD-10-CM | POA: Diagnosis not present

## 2021-09-07 DIAGNOSIS — N2581 Secondary hyperparathyroidism of renal origin: Secondary | ICD-10-CM | POA: Diagnosis not present

## 2021-09-09 DIAGNOSIS — Z992 Dependence on renal dialysis: Secondary | ICD-10-CM | POA: Diagnosis not present

## 2021-09-09 DIAGNOSIS — N2581 Secondary hyperparathyroidism of renal origin: Secondary | ICD-10-CM | POA: Diagnosis not present

## 2021-09-09 DIAGNOSIS — N186 End stage renal disease: Secondary | ICD-10-CM | POA: Diagnosis not present

## 2021-09-10 ENCOUNTER — Encounter: Payer: Self-pay | Admitting: Podiatry

## 2021-09-10 ENCOUNTER — Ambulatory Visit: Payer: Medicare PPO | Admitting: Podiatry

## 2021-09-10 DIAGNOSIS — N179 Acute kidney failure, unspecified: Secondary | ICD-10-CM | POA: Diagnosis not present

## 2021-09-10 DIAGNOSIS — B351 Tinea unguium: Secondary | ICD-10-CM | POA: Diagnosis not present

## 2021-09-10 DIAGNOSIS — M79675 Pain in left toe(s): Secondary | ICD-10-CM | POA: Diagnosis not present

## 2021-09-10 DIAGNOSIS — M79674 Pain in right toe(s): Secondary | ICD-10-CM

## 2021-09-10 DIAGNOSIS — E1169 Type 2 diabetes mellitus with other specified complication: Secondary | ICD-10-CM

## 2021-09-10 NOTE — Progress Notes (Signed)
This patient presents  to my office for at risk foot care.  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  This patient is taking eliquis.This patient is unable to cut nails himself since the patient cannot reach his nails.These nails are painful walking and wearing shoes. He presents to the office in a wheelchair. This patient presents for at risk foot care today.  General Appearance  Alert, conversant and in no acute stress.  Vascular  Dorsalis pedis and posterior tibial  pulses are  not palpable due to swelling  bilaterally.  Capillary return is within normal limits  bilaterally. Temperature is within normal limits  bilaterally.  Neurologic  Senn-Weinstein monofilament wire test diminished  bilaterally. Muscle power within normal limits bilaterally.  Nails Thick disfigured discolored nails with subungual debris  from hallux to fifth toes bilaterally. No evidence of bacterial infection or drainage bilaterally.  Orthopedic  No limitations of motion  feet .  No crepitus or effusions noted.  No bony pathology or digital deformities noted.  Skin  normotropic skin with no porokeratosis noted bilaterally.  No signs of infections or ulcers noted.     Onychomycosis  Pain in right toes  Pain in left toes  Consent was obtained for treatment procedures.   Mechanical debridement of nails 1-5  bilaterally performed with a nail nipper.  Filed with dremel without incident.    Return office visit   3  months                  Told patient to return for periodic foot care and evaluation due to potential at risk complications.   Carlette Palmatier DPM   

## 2021-09-11 DIAGNOSIS — N186 End stage renal disease: Secondary | ICD-10-CM | POA: Diagnosis not present

## 2021-09-11 DIAGNOSIS — Z992 Dependence on renal dialysis: Secondary | ICD-10-CM | POA: Diagnosis not present

## 2021-09-11 DIAGNOSIS — N2581 Secondary hyperparathyroidism of renal origin: Secondary | ICD-10-CM | POA: Diagnosis not present

## 2021-09-14 DIAGNOSIS — N2581 Secondary hyperparathyroidism of renal origin: Secondary | ICD-10-CM | POA: Diagnosis not present

## 2021-09-14 DIAGNOSIS — Z992 Dependence on renal dialysis: Secondary | ICD-10-CM | POA: Diagnosis not present

## 2021-09-14 DIAGNOSIS — N186 End stage renal disease: Secondary | ICD-10-CM | POA: Diagnosis not present

## 2021-09-16 DIAGNOSIS — N2581 Secondary hyperparathyroidism of renal origin: Secondary | ICD-10-CM | POA: Diagnosis not present

## 2021-09-16 DIAGNOSIS — Z992 Dependence on renal dialysis: Secondary | ICD-10-CM | POA: Diagnosis not present

## 2021-09-16 DIAGNOSIS — N186 End stage renal disease: Secondary | ICD-10-CM | POA: Diagnosis not present

## 2021-09-17 ENCOUNTER — Ambulatory Visit: Payer: Medicare PPO

## 2021-09-17 ENCOUNTER — Encounter (HOSPITAL_COMMUNITY): Payer: Medicare PPO

## 2021-09-18 DIAGNOSIS — N2581 Secondary hyperparathyroidism of renal origin: Secondary | ICD-10-CM | POA: Diagnosis not present

## 2021-09-18 DIAGNOSIS — N186 End stage renal disease: Secondary | ICD-10-CM | POA: Diagnosis not present

## 2021-09-18 DIAGNOSIS — Z992 Dependence on renal dialysis: Secondary | ICD-10-CM | POA: Diagnosis not present

## 2021-09-20 NOTE — Progress Notes (Unsigned)
POST OPERATIVE OFFICE NOTE    CC:  F/u for surgery  HPI:  This is a 81 y.o. male who is s/p revision of right arm BC AVF with transposition on 07/16/2021 by Dr. Donzetta Matters.  His original fistula creation was 05/13/2021 also by Dr. Donzetta Matters.    He was last seen on 08/23/2021 and at that time, he was not having any steal sx.  He did have a scab on the distal incision.  He was scheduled for a 3 week f/u with duplex since it was difficult to feel the thrill throughout the fistula.    The pt is on dialysis T/T/S at [place] location.   Allergies  Allergen Reactions   Guaifenesin Swelling and Rash    Hand and feet swelling from Robitussin   Influenza Vaccines Other (See Comments)    Caused flu-like illness   Lexapro [Escitalopram] Nausea And Vomiting    Reported by [facility] - pt does not recall    Current Outpatient Medications  Medication Sig Dispense Refill   acetaminophen (TYLENOL) 500 MG tablet Take 500 mg by mouth daily as needed for headache (pain).     apixaban (ELIQUIS) 2.5 MG TABS tablet Take 1 tablet (2.5 mg total) by mouth 2 (two) times daily. 60 tablet    aspirin EC 81 MG tablet 81 mg every morning.     atropine 1 % ophthalmic solution Place 1 drop into the right eye at bedtime.     brimonidine (ALPHAGAN) 0.2 % ophthalmic solution Place 1 drop into the left eye 3 (three) times daily.     dorzolamide-timolol (COSOPT) 22.3-6.8 MG/ML ophthalmic solution Place 1 drop into the left eye 2 (two) times daily.     doxazosin (CARDURA) 8 MG tablet Take 1 tablet (8 mg total) by mouth daily. (Patient taking differently: Take 8 mg by mouth at bedtime.) 90 tablet 3   dutasteride (AVODART) 0.5 MG capsule Take 1 capsule (0.5 mg total) by mouth every Monday, Wednesday, and Friday. 12 capsule 0   feeding supplement (ENSURE ENLIVE / ENSURE PLUS) LIQD Take 237 mLs by mouth 3 (three) times daily between meals. 237 mL 12   latanoprost (XALATAN) 0.005 % ophthalmic solution Place 1 drop into the  left eye at bedtime.     Multiple Vitamin (MULTIVITAMIN WITH MINERALS) TABS tablet Take 1 tablet by mouth daily. 30 tablet 0   multivitamin (RENA-VIT) TABS tablet Take 1 tablet by mouth daily.     Nutritional Supplements (FEEDING SUPPLEMENT, NEPRO CARB STEADY,) LIQD 237 mLs 3 (three) times daily with meals.     olmesartan-hydrochlorothiazide (BENICAR HCT) 40-12.5 MG tablet Take 1 tablet by mouth at bedtime.     pantoprazole (PROTONIX) 40 MG tablet Take 1 tablet (40 mg total) by mouth daily. 30 tablet 0   pilocarpine (PILOCAR) 4 % ophthalmic solution Place 1 drop into the left eye 3 (three) times daily.  11   polyethylene glycol (MIRALAX / GLYCOLAX) 17 g packet Take 17 g by mouth daily. (Patient taking differently: Take 17 g by mouth daily as needed for mild constipation or moderate constipation.) 14 each 0   prednisoLONE acetate (PRED FORTE) 1 % ophthalmic suspension Place 1 drop into the right eye at bedtime.     senna-docusate (SENOKOT-S) 8.6-50 MG tablet Take 1 tablet by mouth 2 (two) times daily.     traMADol (ULTRAM) 50 MG tablet Take 1 tablet (50 mg total) by mouth every 6 (six) hours as needed. 16 tablet 0   No  current facility-administered medications for this visit.     ROS:  See HPI  Physical Exam:  ***  Incision:  *** Extremities:   There *** a palpable *** pulse.   Motor and sensory *** in tact.   There *** a thrill/bruit present.  The fistula/graft *** easily palpable   Dialysis Duplex on 09/21/2021: Diameter:  *** Depth:  ***   Assessment/Plan:  This is a 81 y.o. male who is s/p: revision of right arm BC AVF with transposition on 07/16/2021 by Dr. Donzetta Matters.  His original fistula creation was 05/13/2021 also by Dr. Donzetta Matters.    -the pt does *** have evidence of steal. -the fistula/graft can be used ***. -if pt has tunneled catheter, this can be removed at the discretion of the dialysis center once the fistula/graft has been accessed to their satisfaction.  -discussed with pt  that access does not last forever and will need intervention or even new access at some point.  -the pt will follow up ***   Leontine Locket, [facility] [phone]  Clinic MD:  Scot Dock

## 2021-09-21 DIAGNOSIS — N186 End stage renal disease: Secondary | ICD-10-CM | POA: Diagnosis not present

## 2021-09-21 DIAGNOSIS — Z992 Dependence on renal dialysis: Secondary | ICD-10-CM | POA: Diagnosis not present

## 2021-09-21 DIAGNOSIS — N2581 Secondary hyperparathyroidism of renal origin: Secondary | ICD-10-CM | POA: Diagnosis not present

## 2021-09-22 ENCOUNTER — Ambulatory Visit (INDEPENDENT_AMBULATORY_CARE_PROVIDER_SITE_OTHER): Payer: Medicare PPO | Admitting: Physician Assistant

## 2021-09-22 ENCOUNTER — Encounter: Payer: Self-pay | Admitting: Physician Assistant

## 2021-09-22 ENCOUNTER — Ambulatory Visit (HOSPITAL_COMMUNITY)
Admission: RE | Admit: 2021-09-22 | Discharge: 2021-09-22 | Disposition: A | Discharge status: Home / Self Care | Payer: Medicare PPO | Source: Ambulatory Visit | Attending: Vascular Surgery | Admitting: Vascular Surgery

## 2021-09-22 VITALS — BP 152/107 | HR 97 | Temp 97.7°F | Resp 20 | Ht 77.0 in

## 2021-09-22 DIAGNOSIS — Z992 Dependence on renal dialysis: Secondary | ICD-10-CM | POA: Insufficient documentation

## 2021-09-22 DIAGNOSIS — N186 End stage renal disease: Secondary | ICD-10-CM

## 2021-09-23 DIAGNOSIS — Z992 Dependence on renal dialysis: Secondary | ICD-10-CM | POA: Diagnosis not present

## 2021-09-23 DIAGNOSIS — N186 End stage renal disease: Secondary | ICD-10-CM | POA: Diagnosis not present

## 2021-09-23 DIAGNOSIS — N2581 Secondary hyperparathyroidism of renal origin: Secondary | ICD-10-CM | POA: Diagnosis not present

## 2021-09-25 DIAGNOSIS — Z992 Dependence on renal dialysis: Secondary | ICD-10-CM | POA: Diagnosis not present

## 2021-09-25 DIAGNOSIS — N186 End stage renal disease: Secondary | ICD-10-CM | POA: Diagnosis not present

## 2021-09-25 DIAGNOSIS — N2581 Secondary hyperparathyroidism of renal origin: Secondary | ICD-10-CM | POA: Diagnosis not present

## 2021-09-27 DIAGNOSIS — Z992 Dependence on renal dialysis: Secondary | ICD-10-CM | POA: Diagnosis not present

## 2021-09-27 DIAGNOSIS — N170 Acute kidney failure with tubular necrosis: Secondary | ICD-10-CM | POA: Diagnosis not present

## 2021-09-27 DIAGNOSIS — N186 End stage renal disease: Secondary | ICD-10-CM | POA: Diagnosis not present

## 2021-09-28 DIAGNOSIS — N2581 Secondary hyperparathyroidism of renal origin: Secondary | ICD-10-CM | POA: Diagnosis not present

## 2021-09-28 DIAGNOSIS — N186 End stage renal disease: Secondary | ICD-10-CM | POA: Diagnosis not present

## 2021-09-28 DIAGNOSIS — Z992 Dependence on renal dialysis: Secondary | ICD-10-CM | POA: Diagnosis not present

## 2021-09-29 ENCOUNTER — Other Ambulatory Visit: Payer: Self-pay | Admitting: *Deleted

## 2021-09-29 DIAGNOSIS — Z992 Dependence on renal dialysis: Secondary | ICD-10-CM

## 2021-09-30 DIAGNOSIS — N2581 Secondary hyperparathyroidism of renal origin: Secondary | ICD-10-CM | POA: Diagnosis not present

## 2021-09-30 DIAGNOSIS — N186 End stage renal disease: Secondary | ICD-10-CM | POA: Diagnosis not present

## 2021-09-30 DIAGNOSIS — Z992 Dependence on renal dialysis: Secondary | ICD-10-CM | POA: Diagnosis not present

## 2021-10-02 DIAGNOSIS — N186 End stage renal disease: Secondary | ICD-10-CM | POA: Diagnosis not present

## 2021-10-02 DIAGNOSIS — Z992 Dependence on renal dialysis: Secondary | ICD-10-CM | POA: Diagnosis not present

## 2021-10-02 DIAGNOSIS — N2581 Secondary hyperparathyroidism of renal origin: Secondary | ICD-10-CM | POA: Diagnosis not present

## 2021-10-05 DIAGNOSIS — Z992 Dependence on renal dialysis: Secondary | ICD-10-CM | POA: Diagnosis not present

## 2021-10-05 DIAGNOSIS — N186 End stage renal disease: Secondary | ICD-10-CM | POA: Diagnosis not present

## 2021-10-05 DIAGNOSIS — N2581 Secondary hyperparathyroidism of renal origin: Secondary | ICD-10-CM | POA: Diagnosis not present

## 2021-10-07 DIAGNOSIS — N186 End stage renal disease: Secondary | ICD-10-CM | POA: Diagnosis not present

## 2021-10-07 DIAGNOSIS — Z992 Dependence on renal dialysis: Secondary | ICD-10-CM | POA: Diagnosis not present

## 2021-10-07 DIAGNOSIS — N2581 Secondary hyperparathyroidism of renal origin: Secondary | ICD-10-CM | POA: Diagnosis not present

## 2021-10-09 DIAGNOSIS — Z992 Dependence on renal dialysis: Secondary | ICD-10-CM | POA: Diagnosis not present

## 2021-10-09 DIAGNOSIS — N2581 Secondary hyperparathyroidism of renal origin: Secondary | ICD-10-CM | POA: Diagnosis not present

## 2021-10-09 DIAGNOSIS — N186 End stage renal disease: Secondary | ICD-10-CM | POA: Diagnosis not present

## 2021-10-12 ENCOUNTER — Telehealth: Payer: Self-pay

## 2021-10-12 DIAGNOSIS — Z992 Dependence on renal dialysis: Secondary | ICD-10-CM | POA: Diagnosis not present

## 2021-10-12 DIAGNOSIS — N186 End stage renal disease: Secondary | ICD-10-CM | POA: Diagnosis not present

## 2021-10-12 DIAGNOSIS — N2581 Secondary hyperparathyroidism of renal origin: Secondary | ICD-10-CM | POA: Diagnosis not present

## 2021-10-12 NOTE — Telephone Encounter (Signed)
Pt's wife (Evangeline) lvm stating pt was recently prescribed Amoxicillin to take for dental concerns and would like to know if pt is safe to proceed with his procedure scheduled with our office on 10/15/21.  I reviewed pt chart with Dr. Carlis Abbott and he stated he felt the amoxicillin will be ok to continue and should have any type of interaction with the type of procedure pt is having.   I LVM advising pt and his wife of the message from Dr. Carlis Abbott, Advised that if they have any additional questions and/or concerns to contact our office.

## 2021-10-14 ENCOUNTER — Other Ambulatory Visit: Payer: Self-pay

## 2021-10-14 ENCOUNTER — Encounter (HOSPITAL_COMMUNITY): Payer: Self-pay | Admitting: Vascular Surgery

## 2021-10-14 DIAGNOSIS — N2581 Secondary hyperparathyroidism of renal origin: Secondary | ICD-10-CM | POA: Diagnosis not present

## 2021-10-14 DIAGNOSIS — N186 End stage renal disease: Secondary | ICD-10-CM | POA: Diagnosis not present

## 2021-10-14 DIAGNOSIS — Z992 Dependence on renal dialysis: Secondary | ICD-10-CM | POA: Diagnosis not present

## 2021-10-14 NOTE — Progress Notes (Addendum)
COVID Vaccine Completed:  Yes  Date of COVID positive in last 90 days:  No  PCP - Josetta Huddle, MD Cardiologist - N/A  Chest x-ray - 04-25-21 Epic EKG - 04-26-21 Epic Stress Test -  ECHO - 04-24-21 Epic Cardiac Cath -  Pacemaker/ICD device last checked: Spinal Cord Stimulator:  Bowel Prep - N/A  Sleep Study - N/A CPAP -   Fasting Blood Sugar -  Checks Blood Sugar - does not check   Blood Thinner Instructions:  Eliquis,  To hold x3 days per vascular.  Last dose taken Monday 10-11-21 Aspirin Instructions:  ASA 81 to remain on per vascular Last Dose:  Activity level:   Can go up a flight of stairs and perform activities of daily living without stopping and without symptoms of chest pain or shortness of breath.  Patient uses a cane for ambulation and wife assists with bathing and dressing as he is blind.  Anesthesia review:  Afib (new diagnosis 2023), renal failure, DM, HTN.  Last dialysis 10-14-21  Patient denies shortness of breath, fever, cough and chest pain at PAT appointment (completed over the phone)  Patient verbalized understanding of instructions that were given to them at the PAT appointment. Patient was also instructed that they will need to review over the PAT instructions again at home before surgery.

## 2021-10-14 NOTE — Anesthesia Preprocedure Evaluation (Addendum)
Anesthesia Evaluation  Patient identified by MRN, date of birth, ID band Patient awake    Reviewed: Allergy & Precautions, H&P , NPO status , Patient's Chart, lab work & pertinent test results, reviewed documented beta blocker date and time   History of Anesthesia Complications (+) history of anesthetic complications  Airway Mallampati: II  TM Distance: >3 FB Neck ROM: full    Dental no notable dental hx.    Pulmonary neg pulmonary ROS, former smoker,    Pulmonary exam normal breath sounds clear to auscultation       Cardiovascular Exercise Tolerance: Good hypertension, Pt. on medications and Pt. on home beta blockers +CHF  Normal cardiovascular exam Rhythm:regular Rate:Normal     Neuro/Psych negative neurological ROS  negative psych ROS   GI/Hepatic Neg liver ROS, GERD  ,  Endo/Other  negative endocrine ROSdiabetes, Type 2  Renal/GU Dialysis and ESRFRenal disease  negative genitourinary   Musculoskeletal   Abdominal   Peds  Hematology  (+) Blood dyscrasia, anemia ,   Anesthesia Other Findings   Reproductive/Obstetrics negative OB ROS                            Anesthesia Physical Anesthesia Plan  ASA: 4  Anesthesia Plan: Regional and MAC   Post-op Pain Management: Regional block* and Minimal or no pain anticipated   Induction: Intravenous  PONV Risk Score and Plan: 2 and Treatment may vary due to age or medical condition and Propofol infusion  Airway Management Planned: Natural Airway and Nasal Cannula  Additional Equipment: None  Intra-op Plan:   Post-operative Plan: Extubation in OR  Informed Consent: I have reviewed the patients History and Physical, chart, labs and discussed the procedure including the risks, benefits and alternatives for the proposed anesthesia with the patient or authorized representative who has indicated his/her understanding and acceptance.      Dental advisory given  Plan Discussed with: Anesthesiologist and CRNA  Anesthesia Plan Comments: (PAT note written 10/14/2021 by Myra Gianotti, PA-C. DISCUSSION: Patient is an 81 year old male scheduled for the above procedure. He had right brachiocephalic AVF creation 8/84/16 with superficialization on 07/16/21. At 09/22/21 follow-up, the AVF was occluded, so new permanent access is needed. He is currently using a right IJ TDC (placed 05/03/32 by IR) for HD TTS.   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 S/p large volume paracentesis 8.3 L on 04/23/21. 04/24/21 echo in setting of volume overload showed: LVEF 55-60%, no regional wall motion abnormalities, RVSF moderately reducted with RV pressure and volume overload, moderately elevated PASP, mildly elevated LA, severely dilated RA, moderate left lateral region pleural effusion, mild MR, moderate to severe TR, trivial AI, 43 mm aortic root/ascending aorta. He failed diuresis and was transferred to the ICU for CRRT 04/25/21. 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 HE transitioned to hemodialysis on 05/04/21. S/p right brachiocephalic AVF creation 1/60/10. Out-patient HD TTS at [facility] arranged. Afib rate controlled on metoprolol CHADVASC score 4. Initially had hematuria with heparin but was able to eventually transition to Eliquis.  Patient underwent 2 HD access procedures in the OR within the past ~ 3-6 months.  He is a same-day work-up.  Anesthesia team to evaluate on the day  of surgery.  Reported instructions to hold Eliquis after 10/01/2021 dose.  He remains on aspirin.  Last hemodialysis 10/14/2021.)      Anesthesia Quick  Evaluation

## 2021-10-14 NOTE — Progress Notes (Signed)
Anesthesia Chart Review: SAME DAY WORK-UP  Case: [id number] Date/Time: 10/15/21 1001   Procedure: RIGHT ARTERIOVENOUS (AV) FISTULA CREATION VERSUS GRAFT (Right)   Anesthesia type: Choice   Pre-op diagnosis: End Stage Renal Disease   Location: [facility] OR ROOM 12 / [place] [facility]   Surgeons: [name], MD       DISCUSSION: Patient is an 81 year old male scheduled for the above procedure. He had right brachiocephalic AVF creation 5/80/99 with superficialization on 07/16/21. At 09/22/21 follow-up, the AVF was occluded, so new permanent access is needed. He is currently using a right IJ TDC (placed 05/03/32 by IR) for HD TTS.    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 S/p large volume paracentesis 8.3 L on 04/23/21. 04/24/21 echo in setting of volume overload showed: LVEF 55-60%, no regional wall motion abnormalities, RVSF moderately reducted with RV pressure and volume overload, moderately elevated PASP, mildly elevated LA, severely dilated RA, moderate left lateral region pleural effusion, mild MR, moderate to severe TR, trivial AI, 43 mm aortic root/ascending aorta. He failed diuresis and was transferred to the ICU for CRRT 04/25/21. 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 HE transitioned to hemodialysis on 05/04/21. S/p right brachiocephalic AVF creation 07/02/37. Out-patient HD TTS at [facility] arranged. Afib rate controlled on metoprolol CHADVASC score 4. Initially had hematuria with heparin but was able to eventually transition to Eliquis.   Patient underwent 2 HD access procedures in the OR within the past ~ 3-6 months.  He is a same-day work-up.  Anesthesia team to evaluate on the day of  surgery.  Reported instructions to hold Eliquis after 10/01/2021 dose.  He remains on aspirin.  Last hemodialysis 10/14/2021.   VS: Ht '6\' 6"'$  (1.981 m)   Wt 99.8 kg   BMI 25.42 kg/m  BP Readings from Last 3 Encounters:  09/22/21 (!) 152/107  08/23/21 (!) 146/101  08/11/21 (!) 156/111   Pulse Readings from Last 3 Encounters:  09/22/21 97  08/23/21 (!) 101  08/11/21 (!) 104     PROVIDERS: Josetta Huddle, MD is PCP  Raynelle Fanning, MD is ophthalmologist [facility] is nephrology providers   LABS: For ISTAT on arrival. Last results in [facility] include: Lab Results  Component Value Date   WBC 5.2 05/13/2021   HGB 11.6 (L) 07/16/2021   HCT 34.0 (L) 07/16/2021   PLT 294 05/13/2021   GLUCOSE 89 07/16/2021   ALT 9 04/26/2021   AST 19 04/26/2021   NA 134 (L) 07/16/2021   K 2.8 (L) 07/16/2021   CL 96 (L) 07/16/2021   CREATININE 4.70 (H) 07/16/2021   BUN 15 07/16/2021   CO2 28 05/14/2021   INR 1.4 (H) 04/29/2021   HGBA1C 4.8 04/23/2021    IMAGES: 1V PCXR (post CVL, in setting of renal failure/volume overload): IMPRESSION: Cardiomegaly. Central pulmonary vessels are more prominent suggesting CHF. Tip of right IJ central venous catheter is seen in the superior vena cava. There is no pneumothorax.   EKG: 04/23/21: Afib at 85 bpm. Left posterior fascicular block.    CV: Echo 04/24/21 (in setting of acute renal failure/volume overload/ascites-->dialysis initiated that admission): IMPRESSIONS   1. Left ventricular ejection fraction, by estimation, is 55 to 60%. The  left ventricle has normal function.  The left ventricle has no regional  wall motion abnormalities. The left ventricular internal cavity size was  moderately dilated. Left ventricular  diastolic parameters are indeterminate. There is the interventricular  septum is flattened in systole and diastole, consistent with right  ventricular pressure and volume overload.   2. Right ventricular systolic function is  moderately reduced. The right  ventricular size is severely enlarged. There is moderately elevated  pulmonary artery systolic pressure.   3. Left atrial size was mildly dilated.   4. Right atrial size was severely dilated.   5. Moderate pleural effusion in the left lateral region.   6. The mitral valve is normal in structure. Mild mitral valve  regurgitation. No evidence of mitral stenosis.   7. Tricuspid valve regurgitation is moderate to severe.   8. The aortic valve is tricuspid. Aortic valve regurgitation is trivial.  Aortic valve sclerosis is present, with no evidence of aortic valve  stenosis.   9. Aortic dilatation noted. There is mild dilatation of the aortic root,  measuring 43 mm. There is mild dilatation of the ascending aorta,  measuring 43 mm. There is mild dilatation of the aortic arch, measuring 43  mm.  10. The inferior vena cava is dilated in size with <50% respiratory  variability, suggesting right atrial pressure of 15 mmHg.  - Comparison(s): No prior Echocardiogram.    Past Medical History:  Diagnosis Date   Anemia    Atrial fibrillation (HCC)    Blind    Chronic kidney disease    Complication of anesthesia    Retention after eye surgery   Diabetes mellitus without complication ([place])    Type II, does not check levels at home.   Glaucoma    Bilateral   Hypertension    Macular degeneration    Bilateral   Prostate cancer [facility])    Renal failure     Past Surgical History:  Procedure Laterality Date   AV FISTULA PLACEMENT Right 05/13/2021   Procedure: CREATION OF RIGHT ARM BRACHIOCEPHALIC FISTULA;  Surgeon: Waynetta Sandy, MD;  Location: [place];  Service: Vascular;  Laterality: Right;   EYE SURGERY     Bilateral glaucoma and macular degeneration   FISTULA SUPERFICIALIZATION Right 07/16/2021   Procedure: RIGHT UPPER EXTREMITY FISTULA SUPERFICIALIZATION WITH BRANCH LIGATION;  Surgeon: Waynetta Sandy, MD;  Location: [place];  Service:  Vascular;  Laterality: Right;  Block   IR FLUORO GUIDE CV LINE RIGHT  05/03/2021   IR PARACENTESIS  04/23/2021   IR US GUIDE VASC ACCESS RIGHT  05/03/2021    MEDICATIONS: No current facility-administered medications for this encounter.    acetaminophen (TYLENOL) 500 MG tablet   apixaban (ELIQUIS) 2.5 MG TABS tablet   aspirin EC 81 MG tablet   atropine 1 % ophthalmic solution   brimonidine (ALPHAGAN) 0.2 % ophthalmic solution   dorzolamide-timolol (COSOPT) 22.3-6.8 MG/ML ophthalmic solution   doxazosin (CARDURA) 8 MG tablet   dutasteride (AVODART) 0.5 MG capsule   iron sucrose in sodium chloride 0.9 % 100 mL   latanoprost (XALATAN) 0.005 % ophthalmic solution   multivitamin (RENA-VIT) TABS tablet   Nutritional Supplements (FEEDING SUPPLEMENT, NEPRO CARB STEADY,) LIQD   olmesartan-hydrochlorothiazide (BENICAR HCT) 40-12.5 MG tablet   pilocarpine (PILOCAR) 4 % ophthalmic solution   polyethylene glycol (MIRALAX / GLYCOLAX) 17 g packet   prednisoLONE acetate (PRED FORTE) 1 % ophthalmic suspension   feeding supplement (ENSURE ENLIVE / ENSURE PLUS) LIQD   Multiple Vitamin (MULTIVITAMIN WITH MINERALS) TABS tablet  pantoprazole (PROTONIX) 40 MG tablet   senna-docusate (SENOKOT-S) 8.6-50 MG tablet   traMADol (ULTRAM) 50 MG tablet    Myra Gianotti, PA-C Surgical Short Stay/Anesthesiology [facility] Phone [phone] [facility] Phone [phone] 10/14/2021 2:05 PM

## 2021-10-15 ENCOUNTER — Encounter (HOSPITAL_COMMUNITY)
Admission: RE | Disposition: A | Discharge status: Home / Self Care | Payer: Self-pay | Source: Ambulatory Visit | Attending: Vascular Surgery

## 2021-10-15 ENCOUNTER — Ambulatory Visit (HOSPITAL_COMMUNITY)
Admission: RE | Admit: 2021-10-15 | Discharge: 2021-10-15 | Disposition: A | Discharge status: Home / Self Care | Payer: Medicare PPO | Source: Ambulatory Visit | Attending: Vascular Surgery | Admitting: Vascular Surgery

## 2021-10-15 ENCOUNTER — Ambulatory Visit (HOSPITAL_COMMUNITY): Payer: Medicare PPO | Admitting: Vascular Surgery

## 2021-10-15 ENCOUNTER — Telehealth: Payer: Self-pay | Admitting: Physician Assistant

## 2021-10-15 ENCOUNTER — Encounter (HOSPITAL_COMMUNITY): Payer: Self-pay | Admitting: Vascular Surgery

## 2021-10-15 ENCOUNTER — Ambulatory Visit (HOSPITAL_BASED_OUTPATIENT_CLINIC_OR_DEPARTMENT_OTHER): Payer: Medicare PPO | Admitting: Vascular Surgery

## 2021-10-15 ENCOUNTER — Other Ambulatory Visit: Payer: Self-pay

## 2021-10-15 DIAGNOSIS — I509 Heart failure, unspecified: Secondary | ICD-10-CM

## 2021-10-15 DIAGNOSIS — Z992 Dependence on renal dialysis: Secondary | ICD-10-CM

## 2021-10-15 DIAGNOSIS — Z7901 Long term (current) use of anticoagulants: Secondary | ICD-10-CM | POA: Diagnosis not present

## 2021-10-15 DIAGNOSIS — Z87891 Personal history of nicotine dependence: Secondary | ICD-10-CM

## 2021-10-15 DIAGNOSIS — E1122 Type 2 diabetes mellitus with diabetic chronic kidney disease: Secondary | ICD-10-CM | POA: Diagnosis not present

## 2021-10-15 DIAGNOSIS — N185 Chronic kidney disease, stage 5: Secondary | ICD-10-CM | POA: Diagnosis not present

## 2021-10-15 DIAGNOSIS — D631 Anemia in chronic kidney disease: Secondary | ICD-10-CM

## 2021-10-15 DIAGNOSIS — I110 Hypertensive heart disease with heart failure: Secondary | ICD-10-CM | POA: Insufficient documentation

## 2021-10-15 DIAGNOSIS — N186 End stage renal disease: Secondary | ICD-10-CM

## 2021-10-15 DIAGNOSIS — I132 Hypertensive heart and chronic kidney disease with heart failure and with stage 5 chronic kidney disease, or end stage renal disease: Secondary | ICD-10-CM

## 2021-10-15 DIAGNOSIS — I120 Hypertensive chronic kidney disease with stage 5 chronic kidney disease or end stage renal disease: Secondary | ICD-10-CM | POA: Diagnosis not present

## 2021-10-15 HISTORY — DX: Unspecified atrial fibrillation: I48.91

## 2021-10-15 HISTORY — DX: Other complications of anesthesia, initial encounter: T88.59XA

## 2021-10-15 HISTORY — DX: Anemia, unspecified: D64.9

## 2021-10-15 HISTORY — DX: Chronic kidney disease, unspecified: N18.9

## 2021-10-15 HISTORY — PX: AV FISTULA PLACEMENT: SHX1204

## 2021-10-15 HISTORY — DX: Malignant neoplasm of prostate: C61

## 2021-10-15 HISTORY — DX: Unspecified kidney failure: N19

## 2021-10-15 LAB — GLUCOSE, CAPILLARY
Glucose-Capillary: 92 mg/dL (ref 70–99)
Glucose-Capillary: 97 mg/dL (ref 70–99)

## 2021-10-15 SURGERY — ARTERIOVENOUS (AV) FISTULA CREATION
Anesthesia: Monitor Anesthesia Care | Laterality: Right

## 2021-10-15 MED ORDER — OXYCODONE HCL 5 MG PO TABS: 5.0000 mg | ORAL_TABLET | Freq: Once | ORAL | Status: DC | PRN

## 2021-10-15 MED ORDER — HEMOSTATIC AGENTS (NO CHARGE) OPTIME
TOPICAL | Status: DC | PRN
  Administered 2021-10-15: 1 via TOPICAL

## 2021-10-15 MED ORDER — FENTANYL CITRATE (PF) 100 MCG/2ML IJ SOLN: 50.0000 ug | Freq: Once | INTRAMUSCULAR | Status: AC

## 2021-10-15 MED ORDER — ACETAMINOPHEN 325 MG PO TABS: 325.0000 mg | ORAL_TABLET | ORAL | Status: DC | PRN

## 2021-10-15 MED ORDER — 0.9 % SODIUM CHLORIDE (POUR BTL) OPTIME
TOPICAL | Status: DC | PRN
  Administered 2021-10-15: 1000 mL

## 2021-10-15 MED ORDER — PROPOFOL 500 MG/50ML IV EMUL
INTRAVENOUS | Status: DC | PRN
  Administered 2021-10-15: 75 ug/kg/min via INTRAVENOUS

## 2021-10-15 MED ORDER — OXYCODONE HCL 5 MG/5ML PO SOLN: 5.0000 mg | Freq: Once | ORAL | Status: DC | PRN

## 2021-10-15 MED ORDER — PROPOFOL 10 MG/ML IV BOLUS
INTRAVENOUS | Status: AC
  Filled 2021-10-15: qty 20

## 2021-10-15 MED ORDER — MIDAZOLAM HCL 2 MG/2ML IJ SOLN: 2.0000 mg | Freq: Once | INTRAMUSCULAR | Status: AC

## 2021-10-15 MED ORDER — CEFAZOLIN SODIUM-DEXTROSE 2-4 GM/100ML-% IV SOLN
INTRAVENOUS | Status: AC
  Filled 2021-10-15: qty 100

## 2021-10-15 MED ORDER — DEXAMETHASONE SODIUM PHOSPHATE 10 MG/ML IJ SOLN
INTRAMUSCULAR | Status: DC | PRN
  Administered 2021-10-15: 10 mg

## 2021-10-15 MED ORDER — FENTANYL CITRATE (PF) 250 MCG/5ML IJ SOLN
INTRAMUSCULAR | Status: AC
  Filled 2021-10-15: qty 5

## 2021-10-15 MED ORDER — MIDAZOLAM HCL 2 MG/2ML IJ SOLN
INTRAMUSCULAR | Status: AC
  Administered 2021-10-15: 2 mg via INTRAVENOUS
  Filled 2021-10-15: qty 2

## 2021-10-15 MED ORDER — SODIUM CHLORIDE 0.9 % IV SOLN: INTRAVENOUS | Status: DC

## 2021-10-15 MED ORDER — HEPARIN SODIUM (PORCINE) 1000 UNIT/ML IJ SOLN
INTRAMUSCULAR | Status: DC | PRN
  Administered 2021-10-15: 3000 [IU] via INTRAVENOUS

## 2021-10-15 MED ORDER — LIDOCAINE 2% (20 MG/ML) 5 ML SYRINGE
INTRAMUSCULAR | Status: DC | PRN
  Administered 2021-10-15: 40 mg via INTRAVENOUS

## 2021-10-15 MED ORDER — CHLORHEXIDINE GLUCONATE 4 % EX LIQD: 60.0000 mL | Freq: Once | CUTANEOUS | Status: DC

## 2021-10-15 MED ORDER — MEPERIDINE HCL 25 MG/ML IJ SOLN: 6.2500 mg | INTRAMUSCULAR | Status: DC | PRN

## 2021-10-15 MED ORDER — HEPARIN SODIUM (PORCINE) 1000 UNIT/ML IJ SOLN
INTRAMUSCULAR | Status: AC
  Filled 2021-10-15: qty 10

## 2021-10-15 MED ORDER — PROPOFOL 1000 MG/100ML IV EMUL
INTRAVENOUS | Status: AC
  Filled 2021-10-15: qty 100

## 2021-10-15 MED ORDER — MIDAZOLAM HCL 2 MG/2ML IJ SOLN
INTRAMUSCULAR | Status: AC
  Filled 2021-10-15: qty 2

## 2021-10-15 MED ORDER — ACETAMINOPHEN 160 MG/5ML PO SOLN: 325.0000 mg | ORAL | Status: DC | PRN

## 2021-10-15 MED ORDER — HEPARIN 6000 UNIT IRRIGATION SOLUTION
Status: DC | PRN
  Administered 2021-10-15: 1

## 2021-10-15 MED ORDER — ONDANSETRON HCL 4 MG/2ML IJ SOLN: 4.0000 mg | Freq: Once | INTRAMUSCULAR | Status: DC | PRN

## 2021-10-15 MED ORDER — CEFAZOLIN SODIUM-DEXTROSE 2-4 GM/100ML-% IV SOLN
2.0000 g | INTRAVENOUS | Status: AC
  Administered 2021-10-15: 2 g via INTRAVENOUS

## 2021-10-15 MED ORDER — LIDOCAINE HCL (PF) 1 % IJ SOLN
INTRAMUSCULAR | Status: AC
  Filled 2021-10-15: qty 30

## 2021-10-15 MED ORDER — OXYCODONE-ACETAMINOPHEN 5-325 MG PO TABS: 1.0000 | ORAL_TABLET | ORAL | 0 refills | Status: DC | PRN

## 2021-10-15 MED ORDER — ROPIVACAINE HCL 7.5 MG/ML IJ SOLN
INTRAMUSCULAR | Status: DC | PRN
  Administered 2021-10-15: 20 mL via PERINEURAL

## 2021-10-15 MED ORDER — FENTANYL CITRATE (PF) 100 MCG/2ML IJ SOLN: 25.0000 ug | INTRAMUSCULAR | Status: DC | PRN

## 2021-10-15 MED ORDER — LIDOCAINE 2% (20 MG/ML) 5 ML SYRINGE
INTRAMUSCULAR | Status: AC
  Filled 2021-10-15: qty 5

## 2021-10-15 MED ORDER — HEPARIN 6000 UNIT IRRIGATION SOLUTION
Status: AC
  Filled 2021-10-15: qty 500

## 2021-10-15 MED ORDER — CHLORHEXIDINE GLUCONATE 0.12 % MT SOLN
OROMUCOSAL | Status: AC
  Filled 2021-10-15: qty 15

## 2021-10-15 MED ORDER — FENTANYL CITRATE (PF) 100 MCG/2ML IJ SOLN
INTRAMUSCULAR | Status: AC
  Administered 2021-10-15: 50 ug via INTRAVENOUS
  Filled 2021-10-15: qty 2

## 2021-10-15 SURGICAL SUPPLY — 53 items
ADH SKN CLS APL DERMABOND .7 (GAUZE/BANDAGES/DRESSINGS) ×1
ADH SKN CLS LQ APL DERMABOND (GAUZE/BANDAGES/DRESSINGS) ×1
AGENT HMST SPONGE THK3/8 (HEMOSTASIS)
ARMBAND PINK RESTRICT EXTREMIT (MISCELLANEOUS) ×4 IMPLANT
BAG COUNTER SPONGE SURGICOUNT (BAG) ×2 IMPLANT
BAG SPNG CNTER NS LX DISP (BAG) ×1
BANDAGE ACE 4X5 VEL STRL LF (GAUZE/BANDAGES/DRESSINGS) IMPLANT
BLADE CLIPPER SURG (BLADE) ×2 IMPLANT
CANISTER SUCT 3000ML PPV (MISCELLANEOUS) ×2 IMPLANT
CLIP LIGATING EXTRA MED SLVR (CLIP) IMPLANT
CLIP LIGATING EXTRA SM BLUE (MISCELLANEOUS) IMPLANT
CLIP VESOCCLUDE MED 6/CT (CLIP) ×2 IMPLANT
CLIP VESOCCLUDE SM WIDE 6/CT (CLIP) ×2 IMPLANT
COVER PROBE W GEL 5X96 (DRAPES) ×2 IMPLANT
DERMABOND ADHESIVE PROPEN (GAUZE/BANDAGES/DRESSINGS) ×1
DERMABOND ADVANCED (GAUZE/BANDAGES/DRESSINGS) ×1
DERMABOND ADVANCED .7 DNX12 (GAUZE/BANDAGES/DRESSINGS) ×2 IMPLANT
DERMABOND ADVANCED .7 DNX6 (GAUZE/BANDAGES/DRESSINGS) IMPLANT
ELECT REM PT RETURN 9FT ADLT (ELECTROSURGICAL) ×1
ELECTRODE REM PT RTRN 9FT ADLT (ELECTROSURGICAL) ×2 IMPLANT
GLOVE BIO SURGEON STRL SZ7 (GLOVE) IMPLANT
GLOVE BIO SURGEON STRL SZ7.5 (GLOVE) ×2 IMPLANT
GLOVE BIOGEL PI IND STRL 6.5 (GLOVE) IMPLANT
GLOVE BIOGEL PI IND STRL 7.0 (GLOVE) IMPLANT
GLOVE BIOGEL PI IND STRL 7.5 (GLOVE) IMPLANT
GLOVE BIOGEL PI IND STRL 8 (GLOVE) ×2 IMPLANT
GLOVE BIOGEL PI INDICATOR 6.5 (GLOVE) ×1
GLOVE BIOGEL PI INDICATOR 7.0 (GLOVE) ×2
GLOVE BIOGEL PI INDICATOR 7.5 (GLOVE) ×2
GLOVE BIOGEL PI INDICATOR 8 (GLOVE) ×1
GOWN STRL REUS W/ TWL LRG LVL3 (GOWN DISPOSABLE) ×4 IMPLANT
GOWN STRL REUS W/ TWL XL LVL3 (GOWN DISPOSABLE) ×4 IMPLANT
GOWN STRL REUS W/TWL LRG LVL3 (GOWN DISPOSABLE) ×2
GOWN STRL REUS W/TWL XL LVL3 (GOWN DISPOSABLE) ×1
HEMOSTAT SNOW SURGICEL 2X4 (HEMOSTASIS) IMPLANT
HEMOSTAT SPONGE AVITENE ULTRA (HEMOSTASIS) IMPLANT
KIT BASIN OR (CUSTOM PROCEDURE TRAY) ×2 IMPLANT
KIT TURNOVER KIT B (KITS) ×2 IMPLANT
NS IRRIG 1000ML POUR BTL (IV SOLUTION) ×2 IMPLANT
PACK CV ACCESS (CUSTOM PROCEDURE TRAY) ×2 IMPLANT
PAD ARMBOARD 7.5X6 YLW CONV (MISCELLANEOUS) ×4 IMPLANT
SHEATH PROBE COVER 6X72 (BAG) IMPLANT
SLING ARM FOAM STRAP LRG (SOFTGOODS) IMPLANT
SLING ARM FOAM STRAP MED (SOFTGOODS) IMPLANT
SPIKE FLUID TRANSFER (MISCELLANEOUS) ×2 IMPLANT
SUT MNCRL AB 4-0 PS2 18 (SUTURE) ×2 IMPLANT
SUT PROLENE 6 0 BV (SUTURE) ×2 IMPLANT
SUT PROLENE 7 0 BV 1 (SUTURE) IMPLANT
SUT VIC AB 3-0 SH 27 (SUTURE) ×1
SUT VIC AB 3-0 SH 27X BRD (SUTURE) ×2 IMPLANT
TOWEL GREEN STERILE (TOWEL DISPOSABLE) ×2 IMPLANT
UNDERPAD 30X36 HEAVY ABSORB (UNDERPADS AND DIAPERS) ×2 IMPLANT
WATER STERILE IRR 1000ML POUR (IV SOLUTION) ×2 IMPLANT

## 2021-10-15 NOTE — Discharge Instructions (Signed)
[facility]  Discharge Instructions  AV Fistula or Graft Surgery for Dialysis Access  Please refer to the following instructions for your post-procedure care. Your surgeon or physician assistant will discuss any changes with you.  Activity  You may drive the day following your surgery, if you are comfortable and no longer taking prescription pain medication. Resume full activity as the soreness in your incision resolves.  Bathing/Showering  You may shower after you go home. Keep your incision dry for 48 hours. Do not soak in a bathtub, hot tub, or swim until the incision heals completely. You may not shower if you have a hemodialysis catheter.  Incision Care  Clean your incision with mild soap and water after 48 hours. Pat the area dry with a clean towel. You do not need a bandage unless otherwise instructed. Do not apply any ointments or creams to your incision. You may have skin glue on your incision. Do not peel it off. It will come off on its own in about one week. Your arm may swell a bit after surgery. To reduce swelling use pillows to elevate your arm so it is above your heart. Your doctor will tell you if you need to lightly wrap your arm with an ACE bandage.  Diet  Resume your normal diet. There are not special food restrictions following this procedure. In order to heal from your surgery, it is CRITICAL to get adequate nutrition. Your body requires vitamins, minerals, and protein. Vegetables are the best source of vitamins and minerals. Vegetables also provide the perfect balance of protein. Processed food has little nutritional value, so try to avoid this.  Medications  Resume taking all of your medications. If your incision is causing pain, you may take over-the counter pain relievers such as acetaminophen (Tylenol). If you were prescribed a stronger pain medication, please be aware these medications can cause nausea and constipation. Prevent  nausea by taking the medication with a snack or meal. Avoid constipation by drinking plenty of fluids and eating foods with high amount of fiber, such as fruits, vegetables, and grains.  Do not take Tylenol if you are taking prescription pain medications.  Follow up Your surgeon may want to see you in the office following your access surgery. If so, this will be arranged at the time of your surgery.  Please call us immediately for any of the following conditions:  Increased pain, redness, drainage (pus) from your incision site Fever of 101 degrees or higher Severe or worsening pain at your incision site Hand pain or numbness.  Reduce your risk of vascular disease:  Stop smoking. If you would like help, call [facility] at 1-800-QUIT-NOW 770-725-0060) or [place] at [place] your cholesterol Maintain a desired weight Control your diabetes Keep your blood pressure down  Dialysis  It will take several weeks to several months for your new dialysis access to be ready for use. Your surgeon will determine when it is okay to use it. Your nephrologist will continue to direct your dialysis. You can continue to use your Permcath until your new access is ready for use.   10/15/2021 Austin Wheeler [id number] 07/07/1940  Surgeon(s): [name], MD  Procedure(s): RIGHT ARTERIOVENOUS (AV) FISTULA CREATION VERSUS GRAFT   May stick graft immediately   May stick graft on designated area only:   x Do not stick fistula for 12 weeks    If you have any questions, please call the office at  [phone].  

## 2021-10-15 NOTE — Anesthesia Procedure Notes (Signed)
Anesthesia Regional Block: Supraclavicular block   Pre-Anesthetic Checklist: , timeout performed,  Correct Patient, Correct Site, Correct Laterality,  Correct Procedure, Correct Position, site marked,  Risks and benefits discussed,  Surgical consent,  Pre-op evaluation,  At surgeon's request and post-op pain management  Laterality: Right  Prep: chloraprep       Needles:  Injection technique: Single-shot  Needle Type: Echogenic Stimulator Needle     Needle Length: 5cm  Needle Gauge: 22     Additional Needles:   Procedures:, nerve stimulator,,, ultrasound used (permanent image in chart),,     Nerve Stimulator or Paresthesia:  Response: hand, 0.45 mA  Additional Responses:   Narrative:  Start time: 10/15/2021 9:10 AM End time: 10/15/2021 9:15 AM Injection made incrementally with aspirations every 5 mL.  Performed by: Personally  Anesthesiologist: Janeece Riggers, MD  Additional Notes: Functioning IV was confirmed and monitors were applied.  A 55m 22ga Arrow echogenic stimulator needle was used. Sterile prep and drape,hand hygiene and sterile gloves were used. Ultrasound guidance: relevant anatomy identified, needle position confirmed, local anesthetic spread visualized around nerve(s)., vascular puncture avoided.  Image printed for medical record. Negative aspiration and negative test dose prior to incremental administration of local anesthetic. The patient tolerated the procedure well.

## 2021-10-15 NOTE — H&P (Signed)
History and Physical Interval Note:  10/15/2021 10:29 AM  Austin Wheeler  has presented today for surgery, with the diagnosis of End Stage Renal Disease.  The various methods of treatment have been discussed with the patient and family. After consideration of risks, benefits and other options for treatment, the patient has consented to  Procedure(s): RIGHT ARTERIOVENOUS (AV) FISTULA CREATION VERSUS GRAFT (Right) as a surgical intervention.  The patient's history has been reviewed, patient examined, no change in status, stable for surgery.  I have reviewed the patient's chart and labs.  Questions were answered to the patient's satisfaction.    New right arm AVF vs graft.  Had basilic vein on previous vein mapping.  Cephalic vein fistula thrombosed.  [name]  POST OPERATIVE OFFICE NOTE       CC:  F/u for surgery   HPI:  This is a 81 y.o. male who is s/p revision of right arm BC AVF with transposition on 07/16/2021 by Dr. Donzetta Matters.  His original fistula creation was 05/13/2021 also by Dr. Donzetta Matters.     He was last seen on 08/23/2021 and at that time, he was not having any steal sx.  He did have a scab on the distal incision.  He was scheduled for a 3 week f/u with duplex since it was difficult to feel the thrill throughout the fistula.     He comes in today for recheck and duplex accompanied by his wife of 54 years.  He states his arm is feeling fine and no pain in his right hand.  He is right hand dominant.     The pt is on dialysis T/T/S at [place] location via right [facility] that was placed 05/03/2021 by IR.          Allergies  Allergen Reactions   Guaifenesin Swelling and Rash      Hand and feet swelling from Robitussin   Influenza Vaccines Other (See Comments)      Caused flu-like illness   Lexapro [Escitalopram] Nausea And Vomiting      Reported by [facility] - pt does not recall            Current Outpatient Medications  Medication Sig Dispense Refill   acetaminophen  (TYLENOL) 500 MG tablet Take 500 mg by mouth daily as needed for headache (pain).       apixaban (ELIQUIS) 2.5 MG TABS tablet Take 1 tablet (2.5 mg total) by mouth 2 (two) times daily. 60 tablet     aspirin EC 81 MG tablet 81 mg every morning.       atropine 1 % ophthalmic solution Place 1 drop into the right eye at bedtime.       brimonidine (ALPHAGAN) 0.2 % ophthalmic solution Place 1 drop into the left eye 3 (three) times daily.       dorzolamide-timolol (COSOPT) 22.3-6.8 MG/ML ophthalmic solution Place 1 drop into the left eye 2 (two) times daily.       doxazosin (CARDURA) 8 MG tablet Take 1 tablet (8 mg total) by mouth daily. (Patient taking differently: Take 8 mg by mouth at bedtime.) 90 tablet 3   dutasteride (AVODART) 0.5 MG capsule Take 1 capsule (0.5 mg total) by mouth every Monday, Wednesday, and Friday. 12 capsule 0   feeding supplement (ENSURE ENLIVE / ENSURE PLUS) LIQD Take 237 mLs by mouth 3 (three) times daily between meals. 237 mL 12   latanoprost (XALATAN) 0.005 % ophthalmic solution Place 1 drop into the  left eye at bedtime.       Multiple Vitamin (MULTIVITAMIN WITH MINERALS) TABS tablet Take 1 tablet by mouth daily. 30 tablet 0   multivitamin (RENA-VIT) TABS tablet Take 1 tablet by mouth daily.       Nutritional Supplements (FEEDING SUPPLEMENT, NEPRO CARB STEADY,) LIQD 237 mLs 3 (three) times daily with meals.       olmesartan-hydrochlorothiazide (BENICAR HCT) 40-12.5 MG tablet Take 1 tablet by mouth at bedtime.       pantoprazole (PROTONIX) 40 MG tablet Take 1 tablet (40 mg total) by mouth daily. 30 tablet 0   pilocarpine (PILOCAR) 4 % ophthalmic solution Place 1 drop into the left eye 3 (three) times daily.   11   polyethylene glycol (MIRALAX / GLYCOLAX) 17 g packet Take 17 g by mouth daily. (Patient taking differently: Take 17 g by mouth daily as needed for mild constipation or moderate constipation.) 14 each 0   prednisoLONE acetate (PRED FORTE) 1 % ophthalmic suspension  Place 1 drop into the right eye at bedtime.       senna-docusate (SENOKOT-S) 8.6-50 MG tablet Take 1 tablet by mouth 2 (two) times daily.       traMADol (ULTRAM) 50 MG tablet Take 1 tablet (50 mg total) by mouth every 6 (six) hours as needed. 16 tablet 0    No current facility-administered medications for this visit.       ROS:  See HPI   Physical Exam:      Today's Vitals    09/22/21 0824  BP: (!) 152/107  Pulse: 97  Resp: 20  Temp: 97.7 F (36.5 C)  TempSrc: Temporal  SpO2: 95%  Height: '6\' 5"'$  (1.956 m)    Body mass index is 26.68 kg/m.     Incision:  all incisions have healed.   Extremities:   There is a palpable right radial pulse.   Motor and sensory are in tact.   There is not a thrill/bruit present.  The fistula is occluded     Dialysis Duplex on 09/21/2021: Occluded fistula.   Vein mapping from March 2023: +-----------------+-------------+----------+---------+  Right Cephalic   Diameter (cm)Depth (cm)Findings   +-----------------+-------------+----------+---------+  Shoulder             0.38        0.67              +-----------------+-------------+----------+---------+  Prox upper arm       0.33        0.48              +-----------------+-------------+----------+---------+  Mid upper arm        0.29        0.32              +-----------------+-------------+----------+---------+  Dist upper arm       0.29        0.31              +-----------------+-------------+----------+---------+  Antecubital fossa    0.47        0.24              +-----------------+-------------+----------+---------+  Prox forearm         0.31        0.47   branching  +-----------------+-------------+----------+---------+  Mid forearm          0.45        0.52              +-----------------+-------------+----------+---------+  Dist forearm         0.30        0.36              +-----------------+-------------+----------+---------+   Wrist                0.20        0.43              +-----------------+-------------+----------+---------+   +-----------------+-------------+----------+---------+  Right Basilic    Diameter (cm)Depth (cm)Findings   +-----------------+-------------+----------+---------+  Prox upper arm       0.37        1.05              +-----------------+-------------+----------+---------+  Mid upper arm        0.36        0.63              +-----------------+-------------+----------+---------+  Dist upper arm       0.36        0.78              +-----------------+-------------+----------+---------+  Antecubital fossa    0.34        1.17   branching  +-----------------+-------------+----------+---------+  Prox forearm         0.21        0.47              +-----------------+-------------+----------+---------+  Mid forearm          0.23        0.55              +-----------------+-------------+----------+---------+  Distal forearm       0.28        0.40              +-----------------+-------------+----------+---------+  Wrist                0.20        0.30              +-----------------+-------------+----------+---------+   +-----------------+-------------+----------+--------+  Left Cephalic    Diameter (cm)Depth (cm)Findings  +-----------------+-------------+----------+--------+  Shoulder             0.22        0.40             +-----------------+-------------+----------+--------+  Prox upper arm       0.20        0.37             +-----------------+-------------+----------+--------+  Mid upper arm        0.16        0.56             +-----------------+-------------+----------+--------+  Dist upper arm       0.30        0.34             +-----------------+-------------+----------+--------+  Antecubital fossa    0.24        0.32             +-----------------+-------------+----------+--------+  Prox forearm          0.33        0.54             +-----------------+-------------+----------+--------+  Mid forearm          0.31        0.39             +-----------------+-------------+----------+--------+  Dist forearm  0.26        0.31             +-----------------+-------------+----------+--------+  Wrist                0.28        0.26             +-----------------+-------------+----------+--------+   +-----------------+-------------+----------+---------+  Left Basilic     Diameter (cm)Depth (cm)Findings   +-----------------+-------------+----------+---------+  Prox upper arm       0.30        0.80              +-----------------+-------------+----------+---------+  Mid upper arm        0.31        0.70              +-----------------+-------------+----------+---------+  Dist upper arm       0.44        0.56              +-----------------+-------------+----------+---------+  Antecubital fossa    0.26        0.42   branching  +-----------------+-------------+----------+---------+  Prox forearm         0.22        0.33              +-----------------+-------------+----------+---------+  Mid forearm          0.20        0.27              +-----------------+-------------+----------+---------+  Distal forearm       0.16        0.28              +-----------------+-------------+----------+---------+      Assessment/Plan:  This is a 81 y.o. male who is s/p: revision of right arm BC AVF with transposition on 07/16/2021 by Dr. Donzetta Matters.  His original fistula creation was 05/13/2021 also by Dr. Donzetta Matters.     -the pt does not have evidence of steal.  His incisions have healed but unfortunately, his right [facility] AVF has occluded.  Upon reviewing his vein mapping from March, he appears to have a right basilic vein suitable for fistula.  I discussed with pt and wife that fistula is occluded and he will need new access.  We discussed that if the vein is  not suitable in the OR, we will place a graft.  He is upset about this, but understands and wants to proceed.  He does have a dentist appt on 8/2 and would like to work around this.   -he has HD on T/T/S and is on Eliquis so will plan for this in the next week.  He understands that Dr. Donzetta Matters is not here and it will be one of his partners proceeding with surgery.   -he currently has a tunneled dialysis catheter that is working well.      Leontine Locket, [facility] [phone]   Clinic MD:  Scot Dock

## 2021-10-15 NOTE — Anesthesia Postprocedure Evaluation (Signed)
Anesthesia Post Note  Patient: Austin Wheeler  Procedure(s) Performed: RIGHT ARTERIOVENOUS (AV) FISTULA CREATION VERSUS GRAFT (Right)     Patient location during evaluation: PACU Anesthesia Type: Regional and MAC Level of consciousness: awake and alert Pain management: pain level controlled Vital Signs Assessment: post-procedure vital signs reviewed and stable Respiratory status: spontaneous breathing, nonlabored ventilation, respiratory function stable and patient connected to nasal cannula oxygen Cardiovascular status: stable and blood pressure returned to baseline Postop Assessment: no apparent nausea or vomiting Anesthetic complications: no   No notable events documented.  Last Vitals:  Vitals:   10/15/21 1330 10/15/21 1345  BP: (!) 126/91 (!) 131/103  Pulse: (!) 102 99  Resp: 15 17  Temp:    SpO2: 95% 93%    Last Pain:  Vitals:   10/15/21 1345  TempSrc:   PainSc: 0-No pain                 Jamielyn Petrucci,[place]

## 2021-10-15 NOTE — Telephone Encounter (Signed)
-----   Message from [facility], [place] sent at 10/15/2021  1:28 PM EDT ----- S/p right first stage basilic vein fistula creation, 8/18  Follow up in 4-6 weeks with duplex study

## 2021-10-15 NOTE — Anesthesia Procedure Notes (Signed)
Procedure Name: MAC Date/Time: 10/15/2021 11:15 AM  Performed by: Moshe Salisbury, CRNAPre-anesthesia Checklist: Patient identified, Emergency Drugs available, Suction available and Patient being monitored Patient Re-evaluated:Patient Re-evaluated prior to induction Oxygen Delivery Method: Nasal cannula Placement Confirmation: positive ETCO2 Dental Injury: Teeth and Oropharynx as per pre-operative assessment

## 2021-10-15 NOTE — Op Note (Signed)
OPERATIVE NOTE   PROCEDURE: right first stage basilic vein transposition (brachiobasilic arteriovenous fistula) placement  PRE-OPERATIVE DIAGNOSIS: End stage renal disease  POST-OPERATIVE DIAGNOSIS: same  SURGEON: [name], MD  ASSISTANT(S): Vicente Serene, PA  ANESTHESIA: MAC  ESTIMATED BLOOD LOSS: Minimal  FINDING(S): 1.  Basilic vein: 3.5 mm, acceptable 2.  Brachial artery: 4 mm, atherosclerotic disease evident 3.  Venous outflow: palpable thrill  4.  Radial flow: dopplerable radial signal  SPECIMEN(S):  none  INDICATIONS:   Austin Wheeler is a 81 y.o. male who presents with end stage renal disease.  The patient is scheduled for right arm AVF.  He has a thrombosed right arm cephalic vein fistula.  The patient is aware the risks include but are not limited to: bleeding, infection, steal syndrome, nerve damage, ischemic monomelic neuropathy, failure to mature, and need for additional procedures.  The patient is aware of the risks of the procedure and elects to proceed forward.   DESCRIPTION: After full informed written consent was obtained from the patient, the patient was brought back to the operating room and placed supine upon the operating table.  Prior to induction, the patient received IV antibiotics.   After obtaining adequate anesthesia, the patient was then prepped and draped in the standard fashion for a right arm access procedure.  I turned my attention first to identifying the patient's basilic vein and brachial artery.   The basilic vein looked to be of good caliber at the elbow.  Using SonoSite guidance, the location of these vessels were marked out on the skin.  I made a transverse incision at the level of the antecubitum and dissected through the subcutaneous tissue and fascia to gain exposure of the brachial artery.  This was noted to be 4 mm in diameter externally.  This was dissected out proximally and distally and controlled with vessel loops .  I  then dissected out the basilic vein.  This was noted to be 3.5 mm in diameter externally.  The distal segment of the vein was ligated with a  2-0 silk, and the vein was transected.  The proximal segment was interrogated with serial dilators.  The vein accepted up to a 4 mm dilator without any difficulty.  I then instilled the heparinized saline into the vein and clamped it.  At this point, I reset my exposure of the brachial artery.  The patient was given 3,000 units IV heparin.  I then placed the artery under tension proximally and distally.  I made an arteriotomy with a #11 blade, and then I extended the arteriotomy with a Potts scissor.  I injected heparinized saline proximal and distal to this arteriotomy.  The vein was then sewn to the artery in an end-to-side configuration with a running stitch of 6-0 Prolene.  Prior to completing this anastomosis, I allowed the vein and artery to backbleed.  There was no evidence of clot from any vessels.  I completed the anastomosis in the usual fashion and then released all vessel loops and clamps.    There was a palpable thrill in the venous outflow, and there was a dopplerable radial signal.  At this point, I irrigated out the surgical wound.  There was no further active bleeding.  The subcutaneous tissue was reapproximated with a running stitch of 3-0 Vicryl.  The skin was then reapproximated with a running subcuticular stitch of 4-0 Monocryl.  The skin was then cleaned, dried, and reinforced with Dermabond.  The patient tolerated this  procedure well.   COMPLICATIONS: None  CONDITION: Stable  [name] MD [facility] Office: [place]   10/15/2021, 12:39 PM

## 2021-10-15 NOTE — Transfer of Care (Signed)
Immediate Anesthesia Transfer of Care Note  Patient: Austin Wheeler  Procedure(s) Performed: RIGHT ARTERIOVENOUS (AV) FISTULA CREATION VERSUS GRAFT (Right)  Patient Location: PACU  Anesthesia Type:MAC combined with regional for post-op pain  Level of Consciousness: drowsy and patient cooperative  Airway & Oxygen Therapy: Patient Spontanous Breathing and Patient connected to nasal cannula oxygen  Post-op Assessment: Report given to RN and Post -op Vital signs reviewed and stable  Post vital signs: Reviewed and stable  Last Vitals:  Vitals Value Taken Time  BP 109/80 10/15/21 1300  Temp    Pulse 100 10/15/21 1300  Resp 11 10/15/21 1300  SpO2 94 % 10/15/21 1300  Vitals shown include unvalidated device data.  Last Pain:  Vitals:   10/15/21 0848  TempSrc:   PainSc: 0-No pain         Complications: No notable events documented.

## 2021-10-16 ENCOUNTER — Encounter (HOSPITAL_COMMUNITY): Payer: Self-pay | Admitting: Vascular Surgery

## 2021-10-16 DIAGNOSIS — N186 End stage renal disease: Secondary | ICD-10-CM | POA: Diagnosis not present

## 2021-10-16 DIAGNOSIS — Z992 Dependence on renal dialysis: Secondary | ICD-10-CM | POA: Diagnosis not present

## 2021-10-16 DIAGNOSIS — N2581 Secondary hyperparathyroidism of renal origin: Secondary | ICD-10-CM | POA: Diagnosis not present

## 2021-10-18 LAB — POCT I-STAT, CHEM 8
BUN: 17 mg/dL (ref 8–23)
Calcium, Ion: 1.05 mmol/L — ABNORMAL LOW (ref 1.15–1.40)
Chloride: 95 mmol/L — ABNORMAL LOW (ref 98–111)
Creatinine, Ser: 4.7 mg/dL — ABNORMAL HIGH (ref 0.61–1.24)
Glucose, Bld: 85 mg/dL (ref 70–99)
HCT: 35 % — ABNORMAL LOW (ref 39.0–52.0)
Hemoglobin: 11.9 g/dL — ABNORMAL LOW (ref 13.0–17.0)
Potassium: 3.9 mmol/L (ref 3.5–5.1)
Sodium: 134 mmol/L — ABNORMAL LOW (ref 135–145)
TCO2: 27 mmol/L (ref 22–32)

## 2021-10-19 DIAGNOSIS — Z992 Dependence on renal dialysis: Secondary | ICD-10-CM | POA: Diagnosis not present

## 2021-10-19 DIAGNOSIS — N2581 Secondary hyperparathyroidism of renal origin: Secondary | ICD-10-CM | POA: Diagnosis not present

## 2021-10-19 DIAGNOSIS — N186 End stage renal disease: Secondary | ICD-10-CM | POA: Diagnosis not present

## 2021-10-19 NOTE — Telephone Encounter (Signed)
Appts have been scheduled.  

## 2021-10-21 DIAGNOSIS — N186 End stage renal disease: Secondary | ICD-10-CM | POA: Diagnosis not present

## 2021-10-21 DIAGNOSIS — Z992 Dependence on renal dialysis: Secondary | ICD-10-CM | POA: Diagnosis not present

## 2021-10-21 DIAGNOSIS — N2581 Secondary hyperparathyroidism of renal origin: Secondary | ICD-10-CM | POA: Diagnosis not present

## 2021-10-23 DIAGNOSIS — N186 End stage renal disease: Secondary | ICD-10-CM | POA: Diagnosis not present

## 2021-10-23 DIAGNOSIS — N2581 Secondary hyperparathyroidism of renal origin: Secondary | ICD-10-CM | POA: Diagnosis not present

## 2021-10-23 DIAGNOSIS — Z992 Dependence on renal dialysis: Secondary | ICD-10-CM | POA: Diagnosis not present

## 2021-10-26 DIAGNOSIS — Z992 Dependence on renal dialysis: Secondary | ICD-10-CM | POA: Diagnosis not present

## 2021-10-26 DIAGNOSIS — N186 End stage renal disease: Secondary | ICD-10-CM | POA: Diagnosis not present

## 2021-10-26 DIAGNOSIS — N2581 Secondary hyperparathyroidism of renal origin: Secondary | ICD-10-CM | POA: Diagnosis not present

## 2021-10-28 DIAGNOSIS — N186 End stage renal disease: Secondary | ICD-10-CM | POA: Diagnosis not present

## 2021-10-28 DIAGNOSIS — N2581 Secondary hyperparathyroidism of renal origin: Secondary | ICD-10-CM | POA: Diagnosis not present

## 2021-10-28 DIAGNOSIS — Z992 Dependence on renal dialysis: Secondary | ICD-10-CM | POA: Diagnosis not present

## 2021-10-28 DIAGNOSIS — N170 Acute kidney failure with tubular necrosis: Secondary | ICD-10-CM | POA: Diagnosis not present

## 2021-10-30 DIAGNOSIS — Z992 Dependence on renal dialysis: Secondary | ICD-10-CM | POA: Diagnosis not present

## 2021-10-30 DIAGNOSIS — N186 End stage renal disease: Secondary | ICD-10-CM | POA: Diagnosis not present

## 2021-10-30 DIAGNOSIS — N2581 Secondary hyperparathyroidism of renal origin: Secondary | ICD-10-CM | POA: Diagnosis not present

## 2021-11-02 DIAGNOSIS — N186 End stage renal disease: Secondary | ICD-10-CM | POA: Diagnosis not present

## 2021-11-02 DIAGNOSIS — Z992 Dependence on renal dialysis: Secondary | ICD-10-CM | POA: Diagnosis not present

## 2021-11-02 DIAGNOSIS — N2581 Secondary hyperparathyroidism of renal origin: Secondary | ICD-10-CM | POA: Diagnosis not present

## 2021-11-04 DIAGNOSIS — N2581 Secondary hyperparathyroidism of renal origin: Secondary | ICD-10-CM | POA: Diagnosis not present

## 2021-11-04 DIAGNOSIS — Z992 Dependence on renal dialysis: Secondary | ICD-10-CM | POA: Diagnosis not present

## 2021-11-04 DIAGNOSIS — N186 End stage renal disease: Secondary | ICD-10-CM | POA: Diagnosis not present

## 2021-11-06 DIAGNOSIS — N2581 Secondary hyperparathyroidism of renal origin: Secondary | ICD-10-CM | POA: Diagnosis not present

## 2021-11-06 DIAGNOSIS — N186 End stage renal disease: Secondary | ICD-10-CM | POA: Diagnosis not present

## 2021-11-06 DIAGNOSIS — Z992 Dependence on renal dialysis: Secondary | ICD-10-CM | POA: Diagnosis not present

## 2021-11-09 DIAGNOSIS — Z992 Dependence on renal dialysis: Secondary | ICD-10-CM | POA: Diagnosis not present

## 2021-11-09 DIAGNOSIS — N2581 Secondary hyperparathyroidism of renal origin: Secondary | ICD-10-CM | POA: Diagnosis not present

## 2021-11-09 DIAGNOSIS — N186 End stage renal disease: Secondary | ICD-10-CM | POA: Diagnosis not present

## 2021-11-11 ENCOUNTER — Other Ambulatory Visit: Payer: Self-pay | Admitting: *Deleted

## 2021-11-11 DIAGNOSIS — N186 End stage renal disease: Secondary | ICD-10-CM

## 2021-11-11 DIAGNOSIS — Z992 Dependence on renal dialysis: Secondary | ICD-10-CM | POA: Diagnosis not present

## 2021-11-11 DIAGNOSIS — N2581 Secondary hyperparathyroidism of renal origin: Secondary | ICD-10-CM | POA: Diagnosis not present

## 2021-11-13 DIAGNOSIS — N2581 Secondary hyperparathyroidism of renal origin: Secondary | ICD-10-CM | POA: Diagnosis not present

## 2021-11-13 DIAGNOSIS — N186 End stage renal disease: Secondary | ICD-10-CM | POA: Diagnosis not present

## 2021-11-13 DIAGNOSIS — Z992 Dependence on renal dialysis: Secondary | ICD-10-CM | POA: Diagnosis not present

## 2021-11-15 ENCOUNTER — Ambulatory Visit: Payer: Medicare PPO | Admitting: Urology

## 2021-11-15 ENCOUNTER — Encounter: Payer: Self-pay | Admitting: Urology

## 2021-11-15 VITALS — BP 130/84 | HR 91 | Ht 78.0 in | Wt 218.0 lb

## 2021-11-15 DIAGNOSIS — C61 Malignant neoplasm of prostate: Secondary | ICD-10-CM | POA: Diagnosis not present

## 2021-11-15 DIAGNOSIS — N401 Enlarged prostate with lower urinary tract symptoms: Secondary | ICD-10-CM | POA: Diagnosis not present

## 2021-11-15 NOTE — Progress Notes (Signed)
11/15/2021 10:31 AM   Austin Wheeler Mar 31, 1940 [id number]  Referring provider: Josetta Huddle, MD [address] [place],  [place] [address]  Chief Complaint  Patient presents with   Prostate Cancer    Urologic problem list: 1. Prostate cancer, moderate risk on active surveillance Prostate biopsy was performed at [facility] in [place] in April 2006 for a PSA of 6.5.        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                         Prostate volume 150 cc; could not decide on treatment options and elected active surveillance.                     Repeat prostate biopsy November 2010 showed a volume of 198 cc and no cancer.    prostate MRI performed September 2017 showed a 1.4 cm PIRADS 4 lesion in the transition zone in the right mid gland.  No adenopathy or evidence of extracapsular extension was identified.        elected to continue active surveillance.   2. BPH with lower urinary tract symptoms doxazosin and dutasteride  HPI: 81 y.o. male presents for follow-up.  Since last visit admitted [facility] for acute on chronic renal failure and new onset A-fib.  No recovery of renal function and currently on dialysis 3 times weekly He is oliguric but remains on dutasteride 3 times weekly and doxazosin Last PSA 11/13/2020 was 3.0 (uncorrected)  PMH: Past Medical History:  Diagnosis Date   Anemia    Atrial fibrillation (HCC)    Blind    Chronic kidney disease    Complication of anesthesia    Retention after eye surgery   Diabetes mellitus without complication (HCC)    Type II, does not check levels at home.   Glaucoma    Bilateral   Hypertension    Macular degeneration    Bilateral   Prostate cancer [facility])    Renal failure     Surgical History: Past Surgical History:  Procedure Laterality Date   AV FISTULA PLACEMENT Right 05/13/2021   Procedure: CREATION OF RIGHT ARM BRACHIOCEPHALIC FISTULA;   Surgeon: Waynetta Sandy, MD;  Location: [place];  Service: Vascular;  Laterality: Right;   AV FISTULA PLACEMENT Right 10/15/2021   Procedure: RIGHT ARTERIOVENOUS (AV) FISTULA CREATION VERSUS GRAFT;  Surgeon: [name], MD;  Location: [place];  Service: Vascular;  Laterality: Right;   EYE SURGERY     Bilateral glaucoma and macular degeneration   FISTULA SUPERFICIALIZATION Right 07/16/2021   Procedure: RIGHT UPPER EXTREMITY FISTULA SUPERFICIALIZATION WITH BRANCH LIGATION;  Surgeon: Waynetta Sandy, MD;  Location: [place];  Service: Vascular;  Laterality: Right;  Block   IR FLUORO GUIDE CV LINE RIGHT  05/03/2021   IR PARACENTESIS  04/23/2021   IR US GUIDE VASC ACCESS RIGHT  05/03/2021    Home Medications:  Allergies as of 11/15/2021       Reactions   Guaifenesin Swelling, Rash   Hand and feet swelling from Robitussin 50 years ago   Robitussin Dm Max Day-night Other (See Comments)   Other reaction(s): feet and leg swelling 50 years ago   Influenza Vaccines Other (See Comments)   Caused flu-like illness Other reaction(s): Unknown   Lexapro [escitalopram] Nausea And Vomiting   Reported by [facility] - pt does  not recall        Medication List        Accurate as of November 15, 2021 10:31 AM. If you have any questions, ask your nurse or doctor.          STOP taking these medications    multivitamin with minerals Tabs tablet Stopped by: [name], MD   oxyCODONE-acetaminophen 5-325 MG tablet Commonly known as: Percocet Stopped by: [name], MD   pantoprazole 40 MG tablet Commonly known as: PROTONIX Stopped by: [name], MD   polyethylene glycol 17 g packet Commonly known as: MIRALAX / GLYCOLAX Stopped by: [name], MD   senna-docusate 8.6-50 MG tablet Commonly known as: Senokot-S Stopped by: [name], MD   traMADol 50 MG tablet Commonly known as: Ultram Stopped by: [name], MD        TAKE these medications    acetaminophen 500 MG tablet Commonly known as: TYLENOL Take 500 mg by mouth 2 (two) times daily.   apixaban 2.5 MG Tabs tablet Commonly known as: ELIQUIS Take 1 tablet (2.5 mg total) by mouth 2 (two) times daily.   aspirin EC 81 MG tablet 81 mg every morning.   atropine 1 % ophthalmic solution Place 1 drop into the right eye at bedtime.   brimonidine 0.2 % ophthalmic solution Commonly known as: ALPHAGAN Place 1 drop into the left eye 3 (three) times daily.   dorzolamide-timolol 22.3-6.8 MG/ML ophthalmic solution Commonly known as: COSOPT Place 1 drop into the left eye 2 (two) times daily.   doxazosin 8 MG tablet Commonly known as: CARDURA Take 1 tablet (8 mg total) by mouth daily. What changed:  when to take this additional instructions   dutasteride 0.5 MG capsule Commonly known as: AVODART Take 1 capsule (0.5 mg total) by mouth every Monday, Wednesday, and Friday.   feeding supplement (NEPRO CARB STEADY) Liqd Take 237 mLs by mouth daily as needed (Supplement). What changed: Another medication with the same name was removed. Continue taking this medication, and follow the directions you see here. Changed by: [name], MD   latanoprost 0.005 % ophthalmic solution Commonly known as: XALATAN Place 1 drop into the left eye at bedtime.   multivitamin Tabs tablet Take 1 tablet by mouth daily.   olmesartan-hydrochlorothiazide 40-12.5 MG tablet Commonly known as: BENICAR HCT Take 1 tablet by mouth at bedtime. Take if blood pressure is above 120 take with Doxazosin 8 mg   pilocarpine 4 % ophthalmic solution Commonly known as: PILOCAR Place 1 drop into the left eye 3 (three) times daily.   prednisoLONE acetate 1 % ophthalmic suspension Commonly known as: PRED FORTE Place 1 drop into the right eye at bedtime.        Allergies:  Allergies  Allergen Reactions   Guaifenesin Swelling and Rash    Hand and feet swelling from  Robitussin 50 years ago   Robitussin Dm Max Day-Night Other (See Comments)    Other reaction(s): feet and leg swelling 50 years ago   Influenza Vaccines Other (See Comments)    Caused flu-like illness Other reaction(s): Unknown   Lexapro [Escitalopram] Nausea And Vomiting    Reported by [facility] - pt does not recall    Family History: No family history on file.  Social History:  reports that he has quit smoking. His smoking use included cigarettes. He smoked an average of .25 packs per day. He has never been exposed to tobacco smoke.  He has never used smokeless tobacco. He reports that he does not currently use alcohol. He reports that he does not use drugs.   Physical Exam: BP 130/84   Pulse 91   Ht '6\' 6"'$  (1.981 m)   Wt 218 lb (98.9 kg)   BMI 25.19 kg/m   Constitutional:  Alert, no acute distress. HEENT: [place] AT, moist mucus membranes.  Trachea midline, no masses. Cardiovascular: No clubbing, cyanosis, or edema. Respiratory: Normal respiratory effort, no increased work of breathing. GU: Declined Psychiatric: Normal mood and affect.   Assessment & Plan:    1.  T1c intermediate risk prostate cancer PSA ordered-fax order sent to hemodialysis center Continue annual follow-up   2.  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, [place], [place] [address] [phone]

## 2021-11-16 DIAGNOSIS — Z992 Dependence on renal dialysis: Secondary | ICD-10-CM | POA: Diagnosis not present

## 2021-11-16 DIAGNOSIS — N186 End stage renal disease: Secondary | ICD-10-CM | POA: Diagnosis not present

## 2021-11-16 DIAGNOSIS — N2581 Secondary hyperparathyroidism of renal origin: Secondary | ICD-10-CM | POA: Diagnosis not present

## 2021-11-18 DIAGNOSIS — N2581 Secondary hyperparathyroidism of renal origin: Secondary | ICD-10-CM | POA: Diagnosis not present

## 2021-11-18 DIAGNOSIS — Z992 Dependence on renal dialysis: Secondary | ICD-10-CM | POA: Diagnosis not present

## 2021-11-18 DIAGNOSIS — N186 End stage renal disease: Secondary | ICD-10-CM | POA: Diagnosis not present

## 2021-11-19 DIAGNOSIS — L720 Epidermal cyst: Secondary | ICD-10-CM | POA: Diagnosis not present

## 2021-11-20 DIAGNOSIS — N2581 Secondary hyperparathyroidism of renal origin: Secondary | ICD-10-CM | POA: Diagnosis not present

## 2021-11-20 DIAGNOSIS — Z992 Dependence on renal dialysis: Secondary | ICD-10-CM | POA: Diagnosis not present

## 2021-11-20 DIAGNOSIS — N186 End stage renal disease: Secondary | ICD-10-CM | POA: Diagnosis not present

## 2021-11-22 NOTE — Progress Notes (Unsigned)
POST OPERATIVE OFFICE NOTE    CC:  F/u for surgery  HPI:  This is a 81 y.o. male who is s/p right 1st stage BVT on 10/15/2021 by Dr. Carlis Abbott.  He has [place] that was placed by IR on 05/03/2021.   Pt states he does *** have pain/numbness in the *** hand.    The pt *** on dialysis *** at *** location.  Dialysis access hx: Right BC AVF 05/13/2021 Dr. Donzetta Matters Revision right arm fistula with transposition 07/16/2021 Dr. Donzetta Matters Right 1st stage BVT 10/15/2021 Dr. Carlis Abbott   Allergies  Allergen Reactions   Guaifenesin Swelling and Rash    Hand and feet swelling from Robitussin 50 years ago   Robitussin Dm Max Day-Night Other (See Comments)    Other reaction(s): feet and leg swelling 50 years ago   Influenza Vaccines Other (See Comments)    Caused flu-like illness Other reaction(s): Unknown   Lexapro [Escitalopram] Nausea And Vomiting    Reported by [facility] - pt does not recall    Current Outpatient Medications  Medication Sig Dispense Refill   acetaminophen (TYLENOL) 500 MG tablet Take 500 mg by mouth 2 (two) times daily.     apixaban (ELIQUIS) 2.5 MG TABS tablet Take 1 tablet (2.5 mg total) by mouth 2 (two) times daily. 60 tablet    aspirin EC 81 MG tablet 81 mg every morning.     atropine 1 % ophthalmic solution Place 1 drop into the right eye at bedtime.     brimonidine (ALPHAGAN) 0.2 % ophthalmic solution Place 1 drop into the left eye 3 (three) times daily.     dorzolamide-timolol (COSOPT) 22.3-6.8 MG/ML ophthalmic solution Place 1 drop into the left eye 2 (two) times daily.     doxazosin (CARDURA) 8 MG tablet Take 1 tablet (8 mg total) by mouth daily. (Patient taking differently: Take 8 mg by mouth at bedtime. DO NO TAKE :If blood pressure is below 120) 90 tablet 3   dutasteride (AVODART) 0.5 MG capsule Take 1 capsule (0.5 mg total) by mouth every Monday, Wednesday, and Friday. 12 capsule 0   latanoprost (XALATAN) 0.005 % ophthalmic solution Place 1 drop into the left eye at bedtime.      multivitamin (RENA-VIT) TABS tablet Take 1 tablet by mouth daily.     Nutritional Supplements (FEEDING SUPPLEMENT, NEPRO CARB STEADY,) LIQD Take 237 mLs by mouth daily as needed (Supplement).     olmesartan-hydrochlorothiazide (BENICAR HCT) 40-12.5 MG tablet Take 1 tablet by mouth at bedtime. Take if blood pressure is above 120 take with Doxazosin 8 mg     pilocarpine (PILOCAR) 4 % ophthalmic solution Place 1 drop into the left eye 3 (three) times daily.  11   prednisoLONE acetate (PRED FORTE) 1 % ophthalmic suspension Place 1 drop into the right eye at bedtime.     No current facility-administered medications for this visit.     ROS:  See HPI  Physical Exam:  ***  Incision:  *** Extremities:   There *** a palpable *** pulse.   Motor and sensory *** in tact.   There *** a thrill/bruit present.  Access is *** easily palpable   Dialysis Duplex on 11/23/2021: ***   Assessment/Plan:  This is a 81 y.o. male who is s/p: right 1st stage BVT on 10/15/2021 by Dr. Carlis Abbott  -the pt does *** have evidence of steal. -pt's access can be used ***. -if pt has tunneled catheter, this can be removed at the discretion of  the dialysis center once the pt's access has been successfully cannulated to their satisfaction.  -discussed with pt that access does not last forever and will need intervention or even new access at some point.  -the pt will follow up ***   Leontine Locket, [facility] [phone]  Clinic MD:  Carlis Abbott

## 2021-11-23 ENCOUNTER — Encounter: Payer: Self-pay | Admitting: Physician Assistant

## 2021-11-23 ENCOUNTER — Other Ambulatory Visit: Payer: Self-pay

## 2021-11-23 ENCOUNTER — Ambulatory Visit (INDEPENDENT_AMBULATORY_CARE_PROVIDER_SITE_OTHER): Payer: Medicare PPO | Admitting: Physician Assistant

## 2021-11-23 ENCOUNTER — Ambulatory Visit (HOSPITAL_COMMUNITY)
Admission: RE | Admit: 2021-11-23 | Discharge: 2021-11-23 | Disposition: A | Discharge status: Home / Self Care | Payer: Medicare PPO | Source: Ambulatory Visit | Attending: Vascular Surgery | Admitting: Vascular Surgery

## 2021-11-23 VITALS — BP 138/95 | HR 91 | Temp 98.0°F | Ht 78.0 in | Wt 218.3 lb

## 2021-11-23 DIAGNOSIS — Z992 Dependence on renal dialysis: Secondary | ICD-10-CM | POA: Diagnosis not present

## 2021-11-23 DIAGNOSIS — N186 End stage renal disease: Secondary | ICD-10-CM | POA: Insufficient documentation

## 2021-11-23 DIAGNOSIS — N2581 Secondary hyperparathyroidism of renal origin: Secondary | ICD-10-CM | POA: Diagnosis not present

## 2021-11-24 DIAGNOSIS — Z23 Encounter for immunization: Secondary | ICD-10-CM | POA: Diagnosis not present

## 2021-11-24 DIAGNOSIS — L720 Epidermal cyst: Secondary | ICD-10-CM | POA: Diagnosis not present

## 2021-11-25 DIAGNOSIS — N2581 Secondary hyperparathyroidism of renal origin: Secondary | ICD-10-CM | POA: Diagnosis not present

## 2021-11-25 DIAGNOSIS — N186 End stage renal disease: Secondary | ICD-10-CM | POA: Diagnosis not present

## 2021-11-25 DIAGNOSIS — Z992 Dependence on renal dialysis: Secondary | ICD-10-CM | POA: Diagnosis not present

## 2021-11-27 DIAGNOSIS — N170 Acute kidney failure with tubular necrosis: Secondary | ICD-10-CM | POA: Diagnosis not present

## 2021-11-27 DIAGNOSIS — Z992 Dependence on renal dialysis: Secondary | ICD-10-CM | POA: Diagnosis not present

## 2021-11-27 DIAGNOSIS — N186 End stage renal disease: Secondary | ICD-10-CM | POA: Diagnosis not present

## 2021-11-27 DIAGNOSIS — N2581 Secondary hyperparathyroidism of renal origin: Secondary | ICD-10-CM | POA: Diagnosis not present

## 2021-11-30 DIAGNOSIS — N186 End stage renal disease: Secondary | ICD-10-CM | POA: Diagnosis not present

## 2021-11-30 DIAGNOSIS — N2581 Secondary hyperparathyroidism of renal origin: Secondary | ICD-10-CM | POA: Diagnosis not present

## 2021-11-30 DIAGNOSIS — Z992 Dependence on renal dialysis: Secondary | ICD-10-CM | POA: Diagnosis not present

## 2021-12-02 DIAGNOSIS — N186 End stage renal disease: Secondary | ICD-10-CM | POA: Diagnosis not present

## 2021-12-02 DIAGNOSIS — Z992 Dependence on renal dialysis: Secondary | ICD-10-CM | POA: Diagnosis not present

## 2021-12-02 DIAGNOSIS — N2581 Secondary hyperparathyroidism of renal origin: Secondary | ICD-10-CM | POA: Diagnosis not present

## 2021-12-04 DIAGNOSIS — Z992 Dependence on renal dialysis: Secondary | ICD-10-CM | POA: Diagnosis not present

## 2021-12-04 DIAGNOSIS — N2581 Secondary hyperparathyroidism of renal origin: Secondary | ICD-10-CM | POA: Diagnosis not present

## 2021-12-04 DIAGNOSIS — N186 End stage renal disease: Secondary | ICD-10-CM | POA: Diagnosis not present

## 2021-12-07 DIAGNOSIS — Z992 Dependence on renal dialysis: Secondary | ICD-10-CM | POA: Diagnosis not present

## 2021-12-07 DIAGNOSIS — N2581 Secondary hyperparathyroidism of renal origin: Secondary | ICD-10-CM | POA: Diagnosis not present

## 2021-12-07 DIAGNOSIS — N186 End stage renal disease: Secondary | ICD-10-CM | POA: Diagnosis not present

## 2021-12-08 ENCOUNTER — Ambulatory Visit (LOCAL_COMMUNITY_HEALTH_CENTER): Payer: Medicare PPO

## 2021-12-08 DIAGNOSIS — Z23 Encounter for immunization: Secondary | ICD-10-CM

## 2021-12-08 DIAGNOSIS — Z719 Counseling, unspecified: Secondary | ICD-10-CM

## 2021-12-08 NOTE — Progress Notes (Signed)
COVID-19 Screening:  Are you feeling sick today? No   Have you ever received a dose of COVID-19 Vaccine? [employer], [place], [place], [place], Other) Yes  If yes, which vaccine and how many doses?     [place]   Did you bring the vaccination record card or other documentation?  Yes   Do you have a health condition or are undergoing treatment that makes you moderately or severely immunocompromised? This would include, but not be limited to: cancer, HIV, organ transplant, immunosuppressive therapy/high-dose corticosteroids, or moderate/severe primary immunodeficiency.  No  Have you received COVID-19 vaccine before or during hematopoietic cell transplant (HCT) or CAR-T-cell therapies? No  Have you ever had an allergic reaction to: (This would include a severe allergic reaction or a reaction that caused hives, swelling, or respiratory distress, including wheezing.) A component of a COVID-19 vaccine or a previous dose of COVID-19 vaccine? No   Have you ever had an allergic reaction to another vaccine (other thanCOVID-19 vaccine) or an injectable medication? (This would include a severe allergic reaction or a reaction that caused hives, swelling, or respiratory distress, including wheezing.)   No    Do you have a history of any of the following:  Myocarditis or Pericarditis No  Dermal fillers:  No  Multisystem Inflammatory Syndrome (MIS-C or MIS-A)? No  COVID-19 disease within the past 3 months? No  Vaccinated with monkeypox vaccine in the last 4 weeks? No  Vaccine administered and tolerated well. Patient information sheet provided to patient.  Copy of NCIR provided. After vaccine care reviewed.  Tashai Catino Shelda Pal, RN

## 2021-12-09 DIAGNOSIS — N186 End stage renal disease: Secondary | ICD-10-CM | POA: Diagnosis not present

## 2021-12-09 DIAGNOSIS — N2581 Secondary hyperparathyroidism of renal origin: Secondary | ICD-10-CM | POA: Diagnosis not present

## 2021-12-09 DIAGNOSIS — Z992 Dependence on renal dialysis: Secondary | ICD-10-CM | POA: Diagnosis not present

## 2021-12-10 ENCOUNTER — Other Ambulatory Visit: Payer: Self-pay

## 2021-12-10 ENCOUNTER — Encounter (HOSPITAL_COMMUNITY): Payer: Self-pay | Admitting: Vascular Surgery

## 2021-12-10 NOTE — Progress Notes (Signed)
Anesthesia Chart Review: Austin Wheeler   Case: [id number] Date/Time: 12/13/21 0715   Procedure: RIGHT SECOND STAGE BASILIC VEIN TRANSPOSITION (Right)   Anesthesia type: Choice   Pre-op diagnosis: End Stage Renal Disease   Location: [facility] OR ROOM 12 / [place] OR   Surgeons: [name], MD       DISCUSSION:  Patient is an 81 year old male scheduled for the above procedure. He had right brachiocephalic AVF creation 8/91/69 with superficialization on 07/16/21 which occluded. S/p first stage right basilic vein transposition on 10/15/21. As of 11/23/21, a right IJ TDC was being used for HD (placed 05/03/21 by IR).     History includes former smoker, HTN, DM2, BPH, glaucoma (legally blind), ESRD (HD catheter 04/25/21; HD TTS at [facility]), afib (04/23/21), anemia, prostate cancer   Otho admission 04/23/21-05/14/21 for acute on chronic renal failure, new onset afib, ascites. S/p large volume paracentesis 8.3 L on 04/23/21. 04/24/21 echo in setting of volume overload showed: LVEF 55-60%, no regional wall motion abnormalities, RVSF moderately reducted with RV pressure and volume overload, moderately elevated PASP, mildly elevated LA, severely dilated RA, moderate left lateral region pleural effusion, mild MR, moderate to severe TR, trivial AI, 43 mm aortic root/ascending aorta. He failed diuresis and was transferred to the ICU for CRRT 04/25/21. 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 HE transitioned to hemodialysis on 05/04/21. S/p right brachiocephalic AVF creation 4/50/38. Out-patient HD TTS at [facility] arranged. Afib rate controlled on metoprolol CHADVASC score 4. Initially had hematuria with heparin but was able to eventually transition to Eliquis.    Patient underwent 3 HD access procedures in the OR within the past ~ 8 months.  He is a same-day work-up.  Anesthesia team to evaluate on the day of surgery.  Reported instructions to  hold Eliquis after 12/10/2021 dose.  He remains on aspirin.     VS: Ht '6\' 6"'$  (1.981 m)   Wt 99.3 kg   BMI 25.31 kg/m  BP Readings from Last 3 Encounters:  11/23/21 (!) 138/95  11/15/21 130/84  10/15/21 (!) 131/103   Pulse Readings from Last 3 Encounters:  11/23/21 91  11/15/21 91  10/15/21 99     PROVIDERS: Josetta Huddle, MD is PCP  Raynelle Fanning, MD is ophthalmologist [facility] is nephrology providers   LABS: For day of surgery.    IMAGES: 1V PCXR 04/25/21 (post CVL, in setting of renal failure/volume overload): IMPRESSION: Cardiomegaly. Central pulmonary vessels are more prominent suggesting CHF. Tip of right IJ central venous catheter is seen in the superior vena cava. There is no pneumothorax.     EKG: 04/23/21: Afib at 85 bpm. Left posterior fascicular block.      CV: Echo 04/24/21 (in setting of acute renal failure/volume overload/ascites-->dialysis initiated that admission): IMPRESSIONS   1. Left ventricular ejection fraction, by estimation, is 55 to 60%. The  left ventricle has normal function. The left ventricle has no regional  wall motion abnormalities. The left ventricular internal cavity size was  moderately dilated. Left ventricular  diastolic parameters are indeterminate. There is the interventricular  septum is flattened in systole and diastole, consistent with right  ventricular pressure and volume overload.   2. Right ventricular systolic function is moderately reduced. The right  ventricular size is severely enlarged. There is moderately elevated  pulmonary artery systolic pressure.   3. Left atrial size was mildly dilated.   4. Right atrial size  was severely dilated.   5. Moderate pleural effusion in the left lateral region.   6. The mitral valve is normal in structure. Mild mitral valve  regurgitation. No evidence of mitral stenosis.   7. Tricuspid valve regurgitation is moderate to severe.   8. The aortic valve is tricuspid.  Aortic valve regurgitation is trivial.  Aortic valve sclerosis is present, with no evidence of aortic valve  stenosis.   9. Aortic dilatation noted. There is mild dilatation of the aortic root,  measuring 43 mm. There is mild dilatation of the ascending aorta,  measuring 43 mm. There is mild dilatation of the aortic arch, measuring 43  mm.  10. The inferior vena cava is dilated in size with <50% respiratory  variability, suggesting right atrial pressure of 15 mmHg.  - Comparison(s): No prior Echocardiogram.    Past Medical History:  Diagnosis Date   Anemia    Atrial fibrillation ([place])    Blind    walker and wheelchair   BPH (benign prostatic hyperplasia)    Chronic kidney disease    Complication of anesthesia    Retention after eye surgery.  Patient's wife denies this dx as of 12/10/21.   Diabetes mellitus without complication (HCC)    Type II, does not check levels at home.   Glaucoma    Bilateral   Hypertension    Macular degeneration    Bilateral   NVG (neovascular glaucoma) 2023   bilateral - followed by Dr Edilia Bo   Prostate cancer [facility])    Renal failure    dialysis T-TH-S   Gilford Rile as ambulation aid    also uses wheelchair    Past Surgical History:  Procedure Laterality Date   AV FISTULA PLACEMENT Right 05/13/2021   Procedure: CREATION OF RIGHT ARM BRACHIOCEPHALIC FISTULA;  Surgeon: Waynetta Sandy, MD;  Location: [place];  Service: Vascular;  Laterality: Right;   AV FISTULA PLACEMENT Right 10/15/2021   Procedure: RIGHT ARTERIOVENOUS (AV) FISTULA CREATION VERSUS GRAFT;  Surgeon: [name], MD;  Location: [place];  Service: Vascular;  Laterality: Right;   EYE SURGERY     Bilateral glaucoma and macular degeneration   FISTULA SUPERFICIALIZATION Right 07/16/2021   Procedure: RIGHT UPPER EXTREMITY FISTULA SUPERFICIALIZATION WITH BRANCH LIGATION;  Surgeon: Waynetta Sandy, MD;  Location: [place];  Service: Vascular;  Laterality: Right;  Block   IR  FLUORO GUIDE CV LINE RIGHT  05/03/2021   IR PARACENTESIS  04/23/2021   IR US GUIDE VASC ACCESS RIGHT  05/03/2021    MEDICATIONS: No current facility-administered medications for this encounter.    acetaminophen (TYLENOL) 500 MG tablet   apixaban (ELIQUIS) 2.5 MG TABS tablet   aspirin EC 81 MG tablet   atropine 1 % ophthalmic solution   brimonidine (ALPHAGAN) 0.2 % ophthalmic solution   dorzolamide-timolol (COSOPT) 22.3-6.8 MG/ML ophthalmic solution   doxazosin (CARDURA) 8 MG tablet   dutasteride (AVODART) 0.5 MG capsule   latanoprost (XALATAN) 0.005 % ophthalmic solution   multivitamin (RENA-VIT) TABS tablet   Nutritional Supplements (FEEDING SUPPLEMENT, NEPRO CARB STEADY,) LIQD   olmesartan-hydrochlorothiazide (BENICAR HCT) 40-12.5 MG tablet   pilocarpine (PILOCAR) 4 % ophthalmic solution   prednisoLONE acetate (PRED FORTE) 1 % ophthalmic suspension   triamcinolone ointment (KENALOG) 0.1 %    Myra Gianotti, PA-C Surgical Short Stay/Anesthesiology [facility] Phone [phone] [facility] Phone [phone] 12/10/2021 1:10 PM

## 2021-12-10 NOTE — Anesthesia Preprocedure Evaluation (Signed)
Anesthesia Evaluation  Patient identified by MRN, date of birth, ID band Patient awake    Reviewed: Allergy & Precautions, NPO status , Patient's Chart, lab work & pertinent test results  Airway Mallampati: II  TM Distance: >3 FB Neck ROM: Full    Dental  (+) Teeth Intact, Dental Advisory Given   Pulmonary former smoker,    Pulmonary exam normal breath sounds clear to auscultation       Cardiovascular hypertension, Pt. on medications +CHF  Normal cardiovascular exam+ dysrhythmias Atrial Fibrillation  Rhythm:Regular Rate:Normal  Echo 04/24/21: 1. Left ventricular ejection fraction, by estimation, is 55 to 60%. The  left ventricle has normal function. The left ventricle has no regional  wall motion abnormalities. The left ventricular internal cavity size was  moderately dilated. Left ventricular  diastolic parameters are indeterminate. There is the interventricular  septum is flattened in systole and diastole, consistent with right  ventricular pressure and volume overload.  2. Right ventricular systolic function is moderately reduced. The right  ventricular size is severely enlarged. There is moderately elevated  pulmonary artery systolic pressure.  3. Left atrial size was mildly dilated.  4. Right atrial size was severely dilated.  5. Moderate pleural effusion in the left lateral region.  6. The mitral valve is normal in structure. Mild mitral valve  regurgitation. No evidence of mitral stenosis.  7. Tricuspid valve regurgitation is moderate to severe.  8. The aortic valve is tricuspid. Aortic valve regurgitation is trivial.  Aortic valve sclerosis is present, with no evidence of aortic valve  stenosis.  9. Aortic dilatation noted. There is mild dilatation of the aortic root,  measuring 43 mm. There is mild dilatation of the ascending aorta,  measuring 43 mm. There is mild dilatation of the aortic arch, measuring 43   mm.  10. The inferior vena cava is dilated in size with <50% respiratory  variability, suggesting right atrial pressure of 15 mmHg.    Neuro/Psych negative neurological ROS  negative psych ROS   GI/Hepatic Neg liver ROS, GERD  ,  Endo/Other  diabetes, Type 2  Renal/GU ESRF and DialysisRenal disease (TTHSAT)   Prostate cancer     Musculoskeletal negative musculoskeletal ROS (+)   Abdominal   Peds  Hematology  (+) Blood dyscrasia (Eliquis), ,   Anesthesia Other Findings   Reproductive/Obstetrics                           Anesthesia Physical Anesthesia Plan  ASA: 3  Anesthesia Plan: General   Post-op Pain Management: Tylenol PO (pre-op)*   Induction: Intravenous  PONV Risk Score and Plan: 2 and Dexamethasone and Ondansetron  Airway Management Planned: LMA  Additional Equipment:   Intra-op Plan:   Post-operative Plan: Extubation in OR  Informed Consent: I have reviewed the patients History and Physical, chart, labs and discussed the procedure including the risks, benefits and alternatives for the proposed anesthesia with the patient or authorized representative who has indicated his/her understanding and acceptance.     Dental advisory given  Plan Discussed with: CRNA  Anesthesia Plan Comments: (PAT note written 12/10/2021 by Myra Gianotti, PA-C. )      Anesthesia Quick Evaluation

## 2021-12-10 NOTE — Progress Notes (Signed)
PCP - Dr Koleen Nimrod  Cardiologist - n/a Urology - Dr John Giovanni  Chest x-ray - 04/25/21 EKG - 04/23/21 Stress Test - n/a ECHO - 04/24/21 Cardiac Cath - n/a  ICD Pacemaker/Loop - n/a  Dialysis - Tuesday, Thursday and Saturday  Sleep Study -  n/a CPAP - none  Diabetes Type 2 - No meds, diet controlled.  Does not check blood sugar.  Eliquis - Last dose was on 12/10/21.  Aspirin Instructions: Continue ASA  Anesthesia review: Yes  STOP now taking any Aspirin (unless otherwise instructed by your surgeon), Aleve, Naproxen, Ibuprofen, Motrin, Advil, Goody's, BC's, all herbal medications, fish oil, and all vitamins.   Coronavirus Screening Do you have any of the following symptoms:  Cough yes/no: No Fever (>100.67F)  yes/no: No Runny nose yes/no: No Sore throat yes/no: No Difficulty breathing/shortness of breath  yes/no: No  Have you traveled in the last 14 days and where? yes/no: No  Patient's Wife Austin Wheeler verbalized understanding of instructions that were given via phone.

## 2021-12-11 DIAGNOSIS — Z992 Dependence on renal dialysis: Secondary | ICD-10-CM | POA: Diagnosis not present

## 2021-12-11 DIAGNOSIS — N2581 Secondary hyperparathyroidism of renal origin: Secondary | ICD-10-CM | POA: Diagnosis not present

## 2021-12-11 DIAGNOSIS — N186 End stage renal disease: Secondary | ICD-10-CM | POA: Diagnosis not present

## 2021-12-13 ENCOUNTER — Encounter (HOSPITAL_COMMUNITY): Payer: Self-pay | Admitting: Vascular Surgery

## 2021-12-13 ENCOUNTER — Other Ambulatory Visit: Payer: Self-pay

## 2021-12-13 ENCOUNTER — Ambulatory Visit (HOSPITAL_COMMUNITY): Payer: Medicare PPO | Admitting: Vascular Surgery

## 2021-12-13 ENCOUNTER — Ambulatory Visit (HOSPITAL_COMMUNITY)
Admission: RE | Admit: 2021-12-13 | Discharge: 2021-12-13 | Disposition: A | Discharge status: Home / Self Care | Payer: Medicare PPO | Attending: Vascular Surgery | Admitting: Vascular Surgery

## 2021-12-13 ENCOUNTER — Encounter (HOSPITAL_COMMUNITY)
Admission: RE | Disposition: A | Discharge status: Home / Self Care | Payer: Self-pay | Source: Home / Self Care | Attending: Vascular Surgery

## 2021-12-13 ENCOUNTER — Ambulatory Visit (HOSPITAL_BASED_OUTPATIENT_CLINIC_OR_DEPARTMENT_OTHER): Payer: Medicare PPO | Admitting: Vascular Surgery

## 2021-12-13 DIAGNOSIS — E1122 Type 2 diabetes mellitus with diabetic chronic kidney disease: Secondary | ICD-10-CM | POA: Insufficient documentation

## 2021-12-13 DIAGNOSIS — I509 Heart failure, unspecified: Secondary | ICD-10-CM | POA: Diagnosis not present

## 2021-12-13 DIAGNOSIS — Z992 Dependence on renal dialysis: Secondary | ICD-10-CM

## 2021-12-13 DIAGNOSIS — I4891 Unspecified atrial fibrillation: Secondary | ICD-10-CM | POA: Insufficient documentation

## 2021-12-13 DIAGNOSIS — I132 Hypertensive heart and chronic kidney disease with heart failure and with stage 5 chronic kidney disease, or end stage renal disease: Secondary | ICD-10-CM

## 2021-12-13 DIAGNOSIS — Z8546 Personal history of malignant neoplasm of prostate: Secondary | ICD-10-CM | POA: Insufficient documentation

## 2021-12-13 DIAGNOSIS — N186 End stage renal disease: Secondary | ICD-10-CM | POA: Diagnosis not present

## 2021-12-13 DIAGNOSIS — N185 Chronic kidney disease, stage 5: Secondary | ICD-10-CM | POA: Diagnosis not present

## 2021-12-13 DIAGNOSIS — Z7901 Long term (current) use of anticoagulants: Secondary | ICD-10-CM | POA: Diagnosis not present

## 2021-12-13 DIAGNOSIS — I120 Hypertensive chronic kidney disease with stage 5 chronic kidney disease or end stage renal disease: Secondary | ICD-10-CM | POA: Diagnosis not present

## 2021-12-13 DIAGNOSIS — Z87891 Personal history of nicotine dependence: Secondary | ICD-10-CM | POA: Insufficient documentation

## 2021-12-13 HISTORY — PX: BASCILIC VEIN TRANSPOSITION: SHX5742

## 2021-12-13 HISTORY — DX: Benign prostatic hyperplasia without lower urinary tract symptoms: N40.0

## 2021-12-13 HISTORY — DX: Dependence on other enabling machines and devices: Z99.89

## 2021-12-13 LAB — POCT I-STAT, CHEM 8
BUN: 28 mg/dL — ABNORMAL HIGH (ref 8–23)
Calcium, Ion: 1.18 mmol/L (ref 1.15–1.40)
Chloride: 100 mmol/L (ref 98–111)
Creatinine, Ser: 6.1 mg/dL — ABNORMAL HIGH (ref 0.61–1.24)
Glucose, Bld: 86 mg/dL (ref 70–99)
HCT: 42 % (ref 39.0–52.0)
Hemoglobin: 14.3 g/dL (ref 13.0–17.0)
Potassium: 3.7 mmol/L (ref 3.5–5.1)
Sodium: 140 mmol/L (ref 135–145)
TCO2: 28 mmol/L (ref 22–32)

## 2021-12-13 LAB — GLUCOSE, CAPILLARY
Glucose-Capillary: 88 mg/dL (ref 70–99)
Glucose-Capillary: 80 mg/dL (ref 70–99)

## 2021-12-13 SURGERY — TRANSPOSITION, VEIN, BASILIC
Anesthesia: General | Site: Arm Upper | Laterality: Right

## 2021-12-13 MED ORDER — PHENYLEPHRINE 80 MCG/ML (10ML) SYRINGE FOR IV PUSH (FOR BLOOD PRESSURE SUPPORT)
PREFILLED_SYRINGE | INTRAVENOUS | Status: DC | PRN
  Administered 2021-12-13 (×2): 80 ug via INTRAVENOUS

## 2021-12-13 MED ORDER — HEPARIN 6000 UNIT IRRIGATION SOLUTION
Status: DC | PRN
  Administered 2021-12-13: 2

## 2021-12-13 MED ORDER — TRAMADOL HCL 50 MG PO TABS: 50.0000 mg | ORAL_TABLET | Freq: Four times a day (QID) | ORAL | 0 refills | Status: AC | PRN

## 2021-12-13 MED ORDER — CEFAZOLIN SODIUM-DEXTROSE 2-4 GM/100ML-% IV SOLN
2.0000 g | INTRAVENOUS | Status: AC
  Administered 2021-12-13: 2 g via INTRAVENOUS
  Filled 2021-12-13: qty 100

## 2021-12-13 MED ORDER — FENTANYL CITRATE (PF) 250 MCG/5ML IJ SOLN
INTRAMUSCULAR | Status: AC
  Filled 2021-12-13: qty 5

## 2021-12-13 MED ORDER — SODIUM CHLORIDE 0.9 % IV SOLN: INTRAVENOUS | Status: DC

## 2021-12-13 MED ORDER — CHLORHEXIDINE GLUCONATE 4 % EX LIQD: 60.0000 mL | Freq: Once | CUTANEOUS | Status: DC

## 2021-12-13 MED ORDER — ORAL CARE MOUTH RINSE: 15.0000 mL | Freq: Once | OROMUCOSAL | Status: AC

## 2021-12-13 MED ORDER — FENTANYL CITRATE (PF) 100 MCG/2ML IJ SOLN
25.0000 ug | INTRAMUSCULAR | Status: DC | PRN
  Administered 2021-12-13: 25 ug via INTRAVENOUS

## 2021-12-13 MED ORDER — PROPOFOL 10 MG/ML IV BOLUS
INTRAVENOUS | Status: AC
  Filled 2021-12-13: qty 20

## 2021-12-13 MED ORDER — ONDANSETRON HCL 4 MG/2ML IJ SOLN
INTRAMUSCULAR | Status: DC | PRN
  Administered 2021-12-13: 4 mg via INTRAVENOUS

## 2021-12-13 MED ORDER — ONDANSETRON HCL 4 MG/2ML IJ SOLN
INTRAMUSCULAR | Status: AC
  Filled 2021-12-13: qty 2

## 2021-12-13 MED ORDER — PHENYLEPHRINE HCL-NACL 20-0.9 MG/250ML-% IV SOLN
INTRAVENOUS | Status: DC | PRN
  Administered 2021-12-13: 25 ug/min via INTRAVENOUS

## 2021-12-13 MED ORDER — LIDOCAINE 2% (20 MG/ML) 5 ML SYRINGE
INTRAMUSCULAR | Status: DC | PRN
  Administered 2021-12-13: 80 mg via INTRAVENOUS

## 2021-12-13 MED ORDER — ACETAMINOPHEN 500 MG PO TABS
1000.0000 mg | ORAL_TABLET | Freq: Once | ORAL | Status: AC
  Administered 2021-12-13: 1000 mg via ORAL
  Filled 2021-12-13: qty 2

## 2021-12-13 MED ORDER — HEPARIN 6000 UNIT IRRIGATION SOLUTION
Status: AC
  Filled 2021-12-13: qty 500

## 2021-12-13 MED ORDER — FENTANYL CITRATE (PF) 100 MCG/2ML IJ SOLN
INTRAMUSCULAR | Status: AC
  Filled 2021-12-13: qty 2

## 2021-12-13 MED ORDER — LIDOCAINE HCL (PF) 1 % IJ SOLN
INTRAMUSCULAR | Status: AC
  Filled 2021-12-13: qty 30

## 2021-12-13 MED ORDER — PROTAMINE SULFATE 10 MG/ML IV SOLN
INTRAVENOUS | Status: AC
  Filled 2021-12-13: qty 5

## 2021-12-13 MED ORDER — HEPARIN SODIUM (PORCINE) 1000 UNIT/ML IJ SOLN
INTRAMUSCULAR | Status: AC
  Filled 2021-12-13: qty 10

## 2021-12-13 MED ORDER — FENTANYL CITRATE (PF) 250 MCG/5ML IJ SOLN
INTRAMUSCULAR | Status: DC | PRN
  Administered 2021-12-13 (×5): 25 ug via INTRAVENOUS

## 2021-12-13 MED ORDER — HEMOSTATIC AGENTS (NO CHARGE) OPTIME
TOPICAL | Status: DC | PRN
  Administered 2021-12-13: 1 via TOPICAL

## 2021-12-13 MED ORDER — PROPOFOL 10 MG/ML IV BOLUS
INTRAVENOUS | Status: DC | PRN
  Administered 2021-12-13: 140 mg via INTRAVENOUS

## 2021-12-13 MED ORDER — CHLORHEXIDINE GLUCONATE 0.12 % MT SOLN
15.0000 mL | Freq: Once | OROMUCOSAL | Status: AC
  Administered 2021-12-13: 15 mL via OROMUCOSAL
  Filled 2021-12-13: qty 15

## 2021-12-13 MED ORDER — PROTAMINE SULFATE 10 MG/ML IV SOLN
INTRAVENOUS | Status: DC | PRN
  Administered 2021-12-13: 20 mg via INTRAVENOUS
  Administered 2021-12-13: 10 mg via INTRAVENOUS

## 2021-12-13 MED ORDER — 0.9 % SODIUM CHLORIDE (POUR BTL) OPTIME
TOPICAL | Status: DC | PRN
  Administered 2021-12-13: 1000 mL

## 2021-12-13 MED ORDER — DEXAMETHASONE SODIUM PHOSPHATE 10 MG/ML IJ SOLN
INTRAMUSCULAR | Status: DC | PRN
  Administered 2021-12-13: 4 mg via INTRAVENOUS

## 2021-12-13 MED ORDER — HEPARIN SODIUM (PORCINE) 1000 UNIT/ML IJ SOLN
INTRAMUSCULAR | Status: DC | PRN
  Administered 2021-12-13: 5000 [IU] via INTRAVENOUS

## 2021-12-13 SURGICAL SUPPLY — 49 items
ADH SKN CLS APL DERMABOND .7 (GAUZE/BANDAGES/DRESSINGS) ×2
AGENT HMST 10 BLLW SHRT CANN (HEMOSTASIS) ×2
AGENT HMST SPONGE THK3/8 (HEMOSTASIS)
ARMBAND PINK RESTRICT EXTREMIT (MISCELLANEOUS) ×2 IMPLANT
BNDG ELASTIC 4X5.8 VLCR STR LF (GAUZE/BANDAGES/DRESSINGS) IMPLANT
BNDG GAUZE DERMACEA FLUFF 4 (GAUZE/BANDAGES/DRESSINGS) IMPLANT
BNDG GZE DERMACEA 4 6PLY (GAUZE/BANDAGES/DRESSINGS) ×1
CANISTER SUCT 3000ML PPV (MISCELLANEOUS) ×2 IMPLANT
CLIP VESOCCLUDE MED 24/CT (CLIP) ×2 IMPLANT
CLIP VESOCCLUDE SM WIDE 24/CT (CLIP) ×2 IMPLANT
COVER PROBE W GEL 5X96 (DRAPES) ×2 IMPLANT
DERMABOND ADVANCED .7 DNX12 (GAUZE/BANDAGES/DRESSINGS) ×2 IMPLANT
ELECT REM PT RETURN 9FT ADLT (ELECTROSURGICAL) ×1
ELECTRODE REM PT RTRN 9FT ADLT (ELECTROSURGICAL) ×2 IMPLANT
GAUZE 4X4 16PLY ~~LOC~~+RFID DBL (SPONGE) IMPLANT
GLOVE BIO SURGEON STRL SZ 6 (GLOVE) IMPLANT
GLOVE BIO SURGEON STRL SZ7.5 (GLOVE) ×2 IMPLANT
GLOVE BIOGEL PI IND STRL 6 (GLOVE) IMPLANT
GLOVE BIOGEL PI IND STRL 8 (GLOVE) ×2 IMPLANT
GOWN STRL REUS W/ TWL LRG LVL3 (GOWN DISPOSABLE) ×4 IMPLANT
GOWN STRL REUS W/ TWL XL LVL3 (GOWN DISPOSABLE) ×4 IMPLANT
GOWN STRL REUS W/TWL LRG LVL3 (GOWN DISPOSABLE) ×2
GOWN STRL REUS W/TWL XL LVL3 (GOWN DISPOSABLE) ×2
HEMOSTAT HEMOBLAST BELLOWS (HEMOSTASIS) IMPLANT
HEMOSTAT SPONGE AVITENE ULTRA (HEMOSTASIS) IMPLANT
KIT BASIN OR (CUSTOM PROCEDURE TRAY) ×2 IMPLANT
KIT TURNOVER KIT B (KITS) ×2 IMPLANT
NDL HYPO 25GX1X1/2 BEV (NEEDLE) ×2 IMPLANT
NEEDLE HYPO 25GX1X1/2 BEV (NEEDLE) IMPLANT
NS IRRIG 1000ML POUR BTL (IV SOLUTION) ×2 IMPLANT
PACK CV ACCESS (CUSTOM PROCEDURE TRAY) ×2 IMPLANT
PAD ARMBOARD 7.5X6 YLW CONV (MISCELLANEOUS) ×4 IMPLANT
SLING ARM FOAM STRAP LRG (SOFTGOODS) IMPLANT
SLING ARM FOAM STRAP MED (SOFTGOODS) IMPLANT
SPIKE FLUID TRANSFER (MISCELLANEOUS) ×2 IMPLANT
SUT MNCRL AB 4-0 PS2 18 (SUTURE) ×2 IMPLANT
SUT PROLENE 5 0 C 1 24 (SUTURE) IMPLANT
SUT PROLENE 6 0 BV (SUTURE) ×2 IMPLANT
SUT PROLENE 7 0 BV 1 (SUTURE) IMPLANT
SUT SILK 2 0 SH (SUTURE) IMPLANT
SUT SILK 3 0 (SUTURE) ×1
SUT SILK 3-0 18XBRD TIE 12 (SUTURE) IMPLANT
SUT VIC AB 2-0 CT1 27 (SUTURE) ×1
SUT VIC AB 2-0 CT1 TAPERPNT 27 (SUTURE) ×2 IMPLANT
SUT VIC AB 3-0 SH 27 (SUTURE) ×3
SUT VIC AB 3-0 SH 27X BRD (SUTURE) ×4 IMPLANT
TOWEL GREEN STERILE (TOWEL DISPOSABLE) ×2 IMPLANT
UNDERPAD 30X36 HEAVY ABSORB (UNDERPADS AND DIAPERS) ×2 IMPLANT
WATER STERILE IRR 1000ML POUR (IV SOLUTION) ×2 IMPLANT

## 2021-12-13 NOTE — Op Note (Signed)
    OPERATIVE NOTE  PROCEDURE: right second stage basilic vein transposition (brachiobasilic arteriovenous fistula) placement  PRE-OPERATIVE DIAGNOSIS: end stage renal disease  POST-OPERATIVE DIAGNOSIS: same  SURGEON: [name], MD  ASSISTANT(S): Paulo Fruit, PA  ANESTHESIA:  LMA  ESTIMATED BLOOD LOSS: 150 mL  FINDING(S): The right arm basilic vein was transposed through 3 separate incisions in the upper arm.  There was a sclerotic segment in the vein adjacent to the arterial anastomosis.  This was dilated up to a #5 dilator and I had excellent inflow.  The vein was transected near the antecubital fossa and transposed through a new skin tunnel and a new end-to-end anastomosis was performed with good thrill.  Patient was oozy even though he did state that he had held his blood thinners.  SPECIMEN(S):  None  INDICATIONS:   Austin Wheeler is a 81 y.o. male who presents with end-stage renal disease and the patient is scheduled for right second stage basilic vein transposition.  The patient is aware the risks include but are not limited to: bleeding, infection, steal syndrome, nerve damage, ischemic monomelic neuropathy, failure to mature, and need for additional procedures.  The patient is aware of the risks of the procedure and elects to proceed forward.   DESCRIPTION: After full informed written consent was obtained from the patient, the patient was brought back to the operating room and placed supine upon the operating table.  Prior to induction, the patient received IV antibiotics.   After obtaining adequate anesthesia, the patient was then prepped and draped in the standard fashion for a right arm access procedure.  I turned my attention first to identifying the patient's brachiobasilic arteriovenous fistula.  Using SonoSite guidance, the location of this fistula was marked out on the skin.    This was an good caliber vein except for a sclerotic segment adjacent to the  arterial anastomosis but it still had an excellent thrill.  I made three longitudinal incisions on the medial aspect of the right upper arm.  Through these incisions I dissected out circumferentially the basilic vein, taking care to protect the nerve.  Once the vein was fully mobilized, all side branches were ligated between 2-0 silk ties and divided.  The vein was marked for orientation.  I then used a curved tunneler to create a subcutaneous tunnel.  The patient was given 5,000 units IV heparin.  The vein was then transected near the antecubital crease.  It was then brought to the previously created tunnel making sure to maintain proper orientation.  A primary anastomosis was then performed between the two cut ends of the vein with a running 6-0 Prolene after it was spatulated.  Once this was done the clamps were released.  There was excellent flow through the fistula.  Hemostasis was then achieved with hemoblast and electrocautery and the patient was very oozy.  The wound was irrigated.  The incision was closed with a deep layer of 3-0 Vicryl followed by a subcutaneous 4-0 Monocryl and Dermabond.  The arm was wrapped with a gentle ace and kerlix for hemostasis.  There were no immediate complications.  COMPLICATIONS: None  CONDITION: Stable  [name], MD [facility] Office: [place]   12/13/2021, 10:30 AM

## 2021-12-13 NOTE — Transfer of Care (Signed)
Immediate Anesthesia Transfer of Care Note  Patient: Austin Wheeler  Procedure(s) Performed: RIGHT SECOND STAGE BASILIC VEIN TRANSPOSITION (Right: Arm Upper)  Patient Location: PACU  Anesthesia Type:General  Level of Consciousness: drowsy  Airway & Oxygen Therapy: Patient Spontanous Breathing and Patient connected to face mask oxygen  Post-op Assessment: Report given to RN and Post -op Vital signs reviewed and stable  Post vital signs: Reviewed and stable  Last Vitals:  Vitals Value Taken Time  BP 126/84 12/13/21 1046  Temp    Pulse 95 12/13/21 1054  Resp 12 12/13/21 1054  SpO2 100 % 12/13/21 1054  Vitals shown include unvalidated device data.  Last Pain:  Vitals:   12/13/21 0648  TempSrc:   PainSc: 0-No pain         Complications: No notable events documented.

## 2021-12-13 NOTE — H&P (Signed)
History and Physical Interval Note:  12/13/2021 7:42 AM  Austin Wheeler  has presented today for surgery, with the diagnosis of End Stage Renal Disease.  The various methods of treatment have been discussed with the patient and family. After consideration of risks, benefits and other options for treatment, the patient has consented to  Procedure(s): RIGHT SECOND STAGE BASILIC VEIN TRANSPOSITION (Right) as a surgical intervention.  The patient's history has been reviewed, patient examined, no change in status, stable for surgery.  I have reviewed the patient's chart and labs.  Questions were answered to the patient's satisfaction.    Right second stage basilic vein transposition.    Austin Wheeler   POST OPERATIVE OFFICE NOTE       CC:  F/u for surgery   HPI:  This is a 81 y.o. male who is s/p right 1st stage BVT on 10/15/2021 by Dr. Carlis Wheeler.  He has [place] that was placed by IR on 05/03/2021.    Pt comes in today for follow up accompanied by his wife.  He states he does not have pain/numbness in the right hand.  He and his wife do not recall talking about a 2nd stage operation if the first one matured nicely.     The pt is on dialysis T/T/S at [place] location.   Dialysis access hx: Right BC AVF 05/13/2021 Dr. Donzetta Matters Revision right arm fistula with transposition 07/16/2021 Dr. Donzetta Matters Right 1st stage BVT 10/15/2021 Dr. Carlis Wheeler          Allergies  Allergen Reactions   Guaifenesin Swelling and Rash      Hand and feet swelling from Robitussin 50 years ago   Robitussin Dm Max Day-Night Other (See Comments)      Other reaction(s): feet and leg swelling 50 years ago   Influenza Vaccines Other (See Comments)      Caused flu-like illness Other reaction(s): Unknown   Lexapro [Escitalopram] Nausea And Vomiting      Reported by [facility] - pt does not recall            Current Outpatient Medications  Medication Sig Dispense Refill   acetaminophen (TYLENOL) 500 MG tablet Take  500 mg by mouth 2 (two) times daily.       apixaban (ELIQUIS) 2.5 MG TABS tablet Take 1 tablet (2.5 mg total) by mouth 2 (two) times daily. 60 tablet     aspirin EC 81 MG tablet 81 mg every morning.       atropine 1 % ophthalmic solution Place 1 drop into the right eye at bedtime.       brimonidine (ALPHAGAN) 0.2 % ophthalmic solution Place 1 drop into the left eye 3 (three) times daily.       dorzolamide-timolol (COSOPT) 22.3-6.8 MG/ML ophthalmic solution Place 1 drop into the left eye 2 (two) times daily.       doxazosin (CARDURA) 8 MG tablet Take 1 tablet (8 mg total) by mouth daily. (Patient taking differently: Take 8 mg by mouth at bedtime. DO NO TAKE :If blood pressure is below 120) 90 tablet 3   dutasteride (AVODART) 0.5 MG capsule Take 1 capsule (0.5 mg total) by mouth every Monday, Wednesday, and Friday. 12 capsule 0   latanoprost (XALATAN) 0.005 % ophthalmic solution Place 1 drop into the left eye at bedtime.       multivitamin (RENA-VIT) TABS tablet Take 1 tablet by mouth daily.       Nutritional Supplements (FEEDING SUPPLEMENT, NEPRO CARB  STEADY,) LIQD Take 237 mLs by mouth daily as needed (Supplement).       olmesartan-hydrochlorothiazide (BENICAR HCT) 40-12.5 MG tablet Take 1 tablet by mouth at bedtime. Take if blood pressure is above 120 take with Doxazosin 8 mg       pilocarpine (PILOCAR) 4 % ophthalmic solution Place 1 drop into the left eye 3 (three) times daily.   11   prednisoLONE acetate (PRED FORTE) 1 % ophthalmic suspension Place 1 drop into the right eye at bedtime.        No current facility-administered medications for this visit.       ROS:  See HPI   Physical Exam:      Today's Vitals    11/23/21 0928  BP: (!) 138/95  Pulse: 91  Temp: 98 F (36.7 C)  TempSrc: Temporal  SpO2: 100%  Weight: 218 lb 4.1 oz (99 kg)  Height: '6\' 6"'$  (1.981 m)    Body mass index is 25.22 kg/m.     Incision:  well healed Extremities:   There is a palpable right radial pulse.    Motor and sensory are in tact.   There is an excellent thrill present throughout the upper arm Access is  easily palpable     Dialysis Duplex on 11/23/2021: Diameter:  0.31cm at [facility] fossa to 0.66cm - patent fistula     Assessment/Plan:  This is a 81 y.o. male who is s/p: right 1st stage BVT on 10/15/2021 by Dr. Carlis Wheeler     -the pt does not have evidence of steal. -pt seen with Dr. Carlis Wheeler.  He has an excellent thrill in the fistula and is maturing.  Discussed with wife and pt that he would require 2nd stage BVT.   Discussed with them that the fistula would still need a total of 12 weeks to mature and they would need to use the fistula several times before removing TDC.  They expressed understanding.    Our scheduler will call pt to schedule as they need to get to the HD center.  They do prefer Friday for surgery day.   -if pt has tunneled catheter, this can be removed at the discretion of the dialysis center once the pt's access has been successfully cannulated to their satisfaction.  -discussed with pt that access does not last forever and will need intervention or even new access at some point.  -pt is on Eliquis-this will need to be stopped a couple days prior to surgery.       Leontine Locket, [facility] [phone]   Clinic MD:  Austin Wheeler

## 2021-12-13 NOTE — Anesthesia Postprocedure Evaluation (Signed)
Anesthesia Post Note  Patient: Austin Wheeler  Procedure(s) Performed: RIGHT SECOND STAGE BASILIC VEIN TRANSPOSITION (Right: Arm Upper)     Patient location during evaluation: PACU Anesthesia Type: General Level of consciousness: awake and alert Pain management: pain level controlled Vital Signs Assessment: post-procedure vital signs reviewed and stable Respiratory status: spontaneous breathing, nonlabored ventilation, respiratory function stable and patient connected to nasal cannula oxygen Cardiovascular status: blood pressure returned to baseline and stable Postop Assessment: no apparent nausea or vomiting Anesthetic complications: no   No notable events documented.  Last Vitals:  Vitals:   12/13/21 1130 12/13/21 1145  BP: (!) 137/96 (!) 140/96  Pulse: 97 97  Resp: 15 13  Temp:  36.7 C  SpO2: 93% 96%    Last Pain:  Vitals:   12/13/21 1145  TempSrc:   PainSc: [place]

## 2021-12-13 NOTE — Anesthesia Procedure Notes (Signed)
Procedure Name: LMA Insertion Date/Time: 12/13/2021 8:02 AM  Performed by: Bryson Corona, CRNAPre-anesthesia Checklist: Patient identified, Emergency Drugs available, Suction available and Patient being monitored Patient Re-evaluated:Patient Re-evaluated prior to induction Oxygen Delivery Method: Circle System Utilized Preoxygenation: Pre-oxygenation with 100% oxygen Induction Type: IV induction Ventilation: Mask ventilation without difficulty LMA: LMA inserted LMA Size: 5.0 Number of attempts: 1 Airway Equipment and Method: Bite block Placement Confirmation: positive ETCO2 Tube secured with: Tape Dental Injury: Teeth and Oropharynx as per pre-operative assessment

## 2021-12-13 NOTE — Discharge Instructions (Signed)
[facility]  Discharge Instructions  AV Fistula or Graft Surgery for Dialysis Access  Please refer to the following instructions for your post-procedure care. Your surgeon or physician assistant will discuss any changes with you.  Activity  You may drive the day following your surgery, if you are comfortable and no longer taking prescription pain medication. Resume full activity as the soreness in your incision resolves.  Bathing/Showering  You may shower after you go home. Keep your incision dry for 48 hours. Do not soak in a bathtub, hot tub, or swim until the incision heals completely. You may not shower if you have a hemodialysis catheter.  Incision Care  Clean your incision with mild soap and water after 48 hours. Pat the area dry with a clean towel. You do not need a bandage unless otherwise instructed. Do not apply any ointments or creams to your incision. You may have skin glue on your incision. Do not peel it off. It will come off on its own in about one week. Your arm may swell a bit after surgery. To reduce swelling use pillows to elevate your arm so it is above your heart. Your doctor will tell you if you need to lightly wrap your arm with an ACE bandage.  Diet  Resume your normal diet. There are not special food restrictions following this procedure. In order to heal from your surgery, it is CRITICAL to get adequate nutrition. Your body requires vitamins, minerals, and protein. Vegetables are the best source of vitamins and minerals. Vegetables also provide the perfect balance of protein. Processed food has little nutritional value, so try to avoid this.  Medications  Resume taking all of your medications. If your incision is causing pain, you may take over-the counter pain relievers such as acetaminophen (Tylenol). If you were prescribed a stronger pain medication, please be aware these medications can cause nausea and constipation. Prevent  nausea by taking the medication with a snack or meal. Avoid constipation by drinking plenty of fluids and eating foods with high amount of fiber, such as fruits, vegetables, and grains.  Do not take Tylenol if you are taking prescription pain medications.  Follow up Your surgeon may want to see you in the office following your access surgery. If so, this will be arranged at the time of your surgery.  Please call us immediately for any of the following conditions:  Increased pain, redness, drainage (pus) from your incision site Fever of 101 degrees or higher Severe or worsening pain at your incision site Hand pain or numbness.  Reduce your risk of vascular disease:  Stop smoking. If you would like help, call [facility] at 1-800-QUIT-NOW 415-554-9612) or [place] at [place] your cholesterol Maintain a desired weight Control your diabetes Keep your blood pressure down  Dialysis  It will take several weeks to several months for your new dialysis access to be ready for use. Your surgeon will determine when it is okay to use it. Your nephrologist will continue to direct your dialysis. You can continue to use your Permcath until your new access is ready for use.   12/13/2021 Austin Wheeler [id number] August 04, 1940  Surgeon(s): [name], MD  Procedure(s): RIGHT SECOND STAGE BASILIC VEIN TRANSPOSITION   May stick graft immediately   May stick graft on designated area only:   X Do not stick Right AV fistula for 4 weeks    If you have any questions, please call the office  at [phone].

## 2021-12-14 ENCOUNTER — Encounter (HOSPITAL_COMMUNITY): Payer: Self-pay | Admitting: Vascular Surgery

## 2021-12-14 DIAGNOSIS — N186 End stage renal disease: Secondary | ICD-10-CM | POA: Diagnosis not present

## 2021-12-14 DIAGNOSIS — Z992 Dependence on renal dialysis: Secondary | ICD-10-CM | POA: Diagnosis not present

## 2021-12-14 DIAGNOSIS — N2581 Secondary hyperparathyroidism of renal origin: Secondary | ICD-10-CM | POA: Diagnosis not present

## 2021-12-16 DIAGNOSIS — N186 End stage renal disease: Secondary | ICD-10-CM | POA: Diagnosis not present

## 2021-12-16 DIAGNOSIS — Z992 Dependence on renal dialysis: Secondary | ICD-10-CM | POA: Diagnosis not present

## 2021-12-16 DIAGNOSIS — N2581 Secondary hyperparathyroidism of renal origin: Secondary | ICD-10-CM | POA: Diagnosis not present

## 2021-12-17 ENCOUNTER — Ambulatory Visit (INDEPENDENT_AMBULATORY_CARE_PROVIDER_SITE_OTHER): Payer: Medicare PPO | Admitting: Podiatry

## 2021-12-17 ENCOUNTER — Encounter: Payer: Self-pay | Admitting: Podiatry

## 2021-12-17 ENCOUNTER — Telehealth: Payer: Self-pay

## 2021-12-17 DIAGNOSIS — B351 Tinea unguium: Secondary | ICD-10-CM | POA: Diagnosis not present

## 2021-12-17 DIAGNOSIS — M79674 Pain in right toe(s): Secondary | ICD-10-CM | POA: Diagnosis not present

## 2021-12-17 DIAGNOSIS — M79675 Pain in left toe(s): Secondary | ICD-10-CM | POA: Diagnosis not present

## 2021-12-17 DIAGNOSIS — E1169 Type 2 diabetes mellitus with other specified complication: Secondary | ICD-10-CM

## 2021-12-17 DIAGNOSIS — N179 Acute kidney failure, unspecified: Secondary | ICD-10-CM

## 2021-12-17 NOTE — Progress Notes (Signed)
This patient presents  to my office for at risk foot care.  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  This patient is taking eliquis.This patient is unable to cut nails himself since the patient cannot reach his nails.These nails are painful walking and wearing shoes. He presents to the office in a wheelchair. This patient presents for at risk foot care today.  General Appearance  Alert, conversant and in no acute stress.  Vascular  Dorsalis pedis and posterior tibial  pulses are  not palpable due to swelling  bilaterally.  Capillary return is within normal limits  bilaterally. Temperature is within normal limits  bilaterally.  Neurologic  Senn-Weinstein monofilament wire test diminished  bilaterally. Muscle power within normal limits bilaterally.  Nails Thick disfigured discolored nails with subungual debris  from hallux to fifth toes bilaterally. No evidence of bacterial infection or drainage bilaterally.  Orthopedic  No limitations of motion  feet .  No crepitus or effusions noted.  No bony pathology or digital deformities noted.  Skin  normotropic skin with no porokeratosis noted bilaterally.  No signs of infections or ulcers noted.     Onychomycosis  Pain in right toes  Pain in left toes  Consent was obtained for treatment procedures.   Mechanical debridement of nails 1-5  bilaterally performed with a nail nipper.  Filed with dremel without incident.    Return office visit   4  months                  Told patient to return for periodic foot care and evaluation due to potential at risk complications.   Gardiner Barefoot DPM

## 2021-12-17 NOTE — Telephone Encounter (Signed)
Pt's wife, Judi Cong, called stating that the pt had stopped his Eliquis on Fri, 10/13, for his surgery. He was supposed to restart Eliquis on 10/18 per d/c instructions. He began having some bleeding from his incision on Thurs and some slight bleeding today.  Reviewed pt's chart, returned call for clarification, two identifiers used. Pt stated that he restarted his Eliquis on 10/18 and took both AM/PM doses. At dialysis on Thurs, the staff removed his ACE wrap and the dressing was saturated with blood. They removed the dressing, reapplied a bandage, and told him to stop taking the Eliquis. Pt states that he hasn't taken any more doses and wanted to know when to restart.  Spoke with Sam, PA who advised for him to restart the Eliquis in a day or two. Instructed pt to keep incision CDI, cover with gauze, use ACE wrap for light compression, and make sure not to pull off any scab or clot when changing the dressing. Pt's wife asked about using a wound cleanser. Instructed her to just use mild soap and water, with no additives. Confirmed understanding.

## 2021-12-18 DIAGNOSIS — Z992 Dependence on renal dialysis: Secondary | ICD-10-CM | POA: Diagnosis not present

## 2021-12-18 DIAGNOSIS — N186 End stage renal disease: Secondary | ICD-10-CM | POA: Diagnosis not present

## 2021-12-18 DIAGNOSIS — N2581 Secondary hyperparathyroidism of renal origin: Secondary | ICD-10-CM | POA: Diagnosis not present

## 2021-12-21 DIAGNOSIS — Z992 Dependence on renal dialysis: Secondary | ICD-10-CM | POA: Diagnosis not present

## 2021-12-21 DIAGNOSIS — N2581 Secondary hyperparathyroidism of renal origin: Secondary | ICD-10-CM | POA: Diagnosis not present

## 2021-12-21 DIAGNOSIS — N186 End stage renal disease: Secondary | ICD-10-CM | POA: Diagnosis not present

## 2021-12-22 ENCOUNTER — Telehealth: Payer: Self-pay

## 2021-12-22 NOTE — Progress Notes (Unsigned)
POST OPERATIVE OFFICE NOTE    CC:  F/u for surgery  HPI:  Austin Wheeler is a 81 y.o. male who is here as a triage visit post op. He is s/p right second stage basilic vein transposition on 12/13/2021 by Dr.Clark.  His first stage procedure was on 10/15/2021.  He has a TDC that was placed by IR on 05/03/2021.  His access history includes right brachiocephalic AV fistula on 0/27/2536 by Dr. Donzetta Matters.  He also had a revision of this right arm fistula with transposition on 07/16/2021 by Dr. Donzetta Matters.  Pt returns today as triage. He states ***   Allergies  Allergen Reactions   Guaifenesin Swelling and Rash    Hand and feet swelling from Robitussin 50 years ago   Robitussin Dm Max Day-Night Other (See Comments)    Other reaction(s): feet and leg swelling 50 years ago   Lexapro [Escitalopram] Nausea And Vomiting    Reported by [facility] - pt does not recall    Current Outpatient Medications  Medication Sig Dispense Refill   acetaminophen (TYLENOL) 500 MG tablet Take 500 mg by mouth 2 (two) times daily.     apixaban (ELIQUIS) 2.5 MG TABS tablet Take 1 tablet (2.5 mg total) by mouth 2 (two) times daily. 60 tablet    aspirin EC 81 MG tablet Take 81 mg by mouth daily.     atropine 1 % ophthalmic solution Place 1 drop into the right eye at bedtime.     brimonidine (ALPHAGAN) 0.2 % ophthalmic solution Place 1 drop into the left eye 3 (three) times daily.     dorzolamide-timolol (COSOPT) 22.3-6.8 MG/ML ophthalmic solution Place 1 drop into the left eye 2 (two) times daily.     doxazosin (CARDURA) 8 MG tablet Take 1 tablet (8 mg total) by mouth daily. (Patient taking differently: Take 8 mg by mouth at bedtime. DO NO TAKE :If blood pressure is below 120) 90 tablet 3   dutasteride (AVODART) 0.5 MG capsule Take 1 capsule (0.5 mg total) by mouth every Monday, Wednesday, and Friday. 12 capsule 0   latanoprost (XALATAN) 0.005 % ophthalmic solution Place 1 drop into the left eye at bedtime.      multivitamin (RENA-VIT) TABS tablet Take 1 tablet by mouth daily.     Nutritional Supplements (FEEDING SUPPLEMENT, NEPRO CARB STEADY,) LIQD Take 237 mLs by mouth daily as needed (Supplement).     olmesartan-hydrochlorothiazide (BENICAR HCT) 40-12.5 MG tablet Take 1 tablet by mouth at bedtime. Take if blood pressure is above 120 take with Doxazosin 8 mg     pilocarpine (PILOCAR) 4 % ophthalmic solution Place 1 drop into the left eye 3 (three) times daily.  11   prednisoLONE acetate (PRED FORTE) 1 % ophthalmic suspension Place 1 drop into the right eye at bedtime.     traMADol (ULTRAM) 50 MG tablet Take 1 tablet (50 mg total) by mouth every 6 (six) hours as needed for severe pain. 20 tablet 0   triamcinolone ointment (KENALOG) 0.1 % Apply 1 Application topically 2 (two) times daily as needed (wound care).     No current facility-administered medications for this visit.     ROS:  See HPI  Physical Exam:  Incision:  RUE incisions *** Extremities:  Palpable R radial pulse. R basilic fistula with palpable thrill Neuro: intact   Assessment/Plan:  This is a 81 y.o. male who is s/p: right second stage basilic vein transposition on 12/13/2021   -***   Austin Serene, PA-C  [facility] [phone]   Clinic MD: Austin Wheeler

## 2021-12-22 NOTE — Telephone Encounter (Addendum)
Pt's wife, Judi Cong, called stating that the pt's arm is swollen, warm, and leaking. She is concerned about infection.  Reviewed pt's chart, returned call for clarification, no answer, lf vm.  Pt's wife returned call. She stated that he was having continuous bloody drainage. She did not use any ACE wrap as we discussed. She stated that the HD center was concerned about the warmth and she wanted him to be evaluated for possible infection. Appt scheduled with PA. Confirmed understanding.

## 2021-12-23 ENCOUNTER — Ambulatory Visit (INDEPENDENT_AMBULATORY_CARE_PROVIDER_SITE_OTHER): Payer: Medicare PPO | Admitting: Physician Assistant

## 2021-12-23 VITALS — BP 135/82 | HR 84 | Temp 97.9°F | Resp 20 | Ht 78.0 in

## 2021-12-23 DIAGNOSIS — Z992 Dependence on renal dialysis: Secondary | ICD-10-CM | POA: Diagnosis not present

## 2021-12-23 DIAGNOSIS — N2581 Secondary hyperparathyroidism of renal origin: Secondary | ICD-10-CM | POA: Diagnosis not present

## 2021-12-23 DIAGNOSIS — N186 End stage renal disease: Secondary | ICD-10-CM | POA: Diagnosis not present

## 2021-12-25 DIAGNOSIS — Z992 Dependence on renal dialysis: Secondary | ICD-10-CM | POA: Diagnosis not present

## 2021-12-25 DIAGNOSIS — N2581 Secondary hyperparathyroidism of renal origin: Secondary | ICD-10-CM | POA: Diagnosis not present

## 2021-12-25 DIAGNOSIS — N186 End stage renal disease: Secondary | ICD-10-CM | POA: Diagnosis not present

## 2021-12-28 DIAGNOSIS — N2581 Secondary hyperparathyroidism of renal origin: Secondary | ICD-10-CM | POA: Diagnosis not present

## 2021-12-28 DIAGNOSIS — N186 End stage renal disease: Secondary | ICD-10-CM | POA: Diagnosis not present

## 2021-12-28 DIAGNOSIS — N170 Acute kidney failure with tubular necrosis: Secondary | ICD-10-CM | POA: Diagnosis not present

## 2021-12-28 DIAGNOSIS — Z992 Dependence on renal dialysis: Secondary | ICD-10-CM | POA: Diagnosis not present

## 2021-12-29 ENCOUNTER — Other Ambulatory Visit: Payer: Self-pay | Admitting: Urology

## 2021-12-29 NOTE — Progress Notes (Signed)
    Postoperative Access Visit   History of Present Illness   Austin Wheeler is a 81 y.o. year old male who presents for postoperative follow-up for:  s/p right second stage basilic vein transposition on 12/13/2021 by Dr.Clark.  His first stage procedure was on 10/15/2021. He was recently seen post operatively due to concerns of extensive bruising in the right upper arm. The fistula was working well. Small superficial skin erosion was present. Patient and his wife were educated on dressing changes and given short interval follow up   They present today for their follow up visit. The right arm is improving. The superficial skin erosion is now a small dry scab. No further bleeding. Wife explains that she has been taking care of it as advised. Denies any pain, coldness, numbness  or weakness in right arm or hand.    He has a TDC that was placed by IR on 05/03/2021. He is currently dialyzing on TTS  Physical Examination   Vitals:   12/31/21 0951  BP: 127/82  Pulse: 79  Resp: 20  Temp: 97.9 F (36.6 C)  TempSrc: Temporal  SpO2: 99%  Height: '6\' 6"'$  (1.981 m)   Body mass index is 25.3 kg/m.  right arm Incision is healing well. Dermabond still present in mid upper arm incision. Distal incision hard to tell if it is true scab vs dried blood under Dermabond. Superficial skin tare overlying fistula. No bleeding. 2+ radial pulse, hand grip is 5/5, sensation in digits is intact, palpable thrill, bruit can be auscultated      Medical Decision Making   Austin Wheeler is a 81 y.o. year old male who presents s/p right second stage basilic vein transposition on 12/13/2021 by Dr.Clark.  His first stage procedure was on 10/15/2021. Had hematoma post operatively on Aspirin and Eliquis. This is resolving. Also had small superficial skin erosion that has scab now. No bleeding. Anticipate this to heal without further issues. He has a TDC that was placed by IR on 05/03/2021. TDC is working well. Discussed with  patient and his wife would wait until scab is healed before use of fistula. Patent is without signs or symptoms of steal syndrome. Will bring him back in 3-4 weeks at request of patient and their wife to check wound once more before cannulation. They know to call for earlier follow up should they have any concerns.  Karoline Caldwell, PA-C [facility] [place] Office: [phone]  Clinic MD: Virl Cagey

## 2021-12-30 ENCOUNTER — Other Ambulatory Visit: Payer: Self-pay | Admitting: Urology

## 2021-12-30 DIAGNOSIS — N186 End stage renal disease: Secondary | ICD-10-CM | POA: Diagnosis not present

## 2021-12-30 DIAGNOSIS — Z992 Dependence on renal dialysis: Secondary | ICD-10-CM | POA: Diagnosis not present

## 2021-12-30 DIAGNOSIS — N2581 Secondary hyperparathyroidism of renal origin: Secondary | ICD-10-CM | POA: Diagnosis not present

## 2021-12-31 ENCOUNTER — Ambulatory Visit (INDEPENDENT_AMBULATORY_CARE_PROVIDER_SITE_OTHER): Payer: Medicare PPO | Admitting: Physician Assistant

## 2021-12-31 VITALS — BP 127/82 | HR 79 | Temp 97.9°F | Resp 20 | Ht 78.0 in

## 2021-12-31 DIAGNOSIS — N186 End stage renal disease: Secondary | ICD-10-CM

## 2021-12-31 DIAGNOSIS — Z992 Dependence on renal dialysis: Secondary | ICD-10-CM

## 2022-01-01 DIAGNOSIS — N2581 Secondary hyperparathyroidism of renal origin: Secondary | ICD-10-CM | POA: Diagnosis not present

## 2022-01-01 DIAGNOSIS — N186 End stage renal disease: Secondary | ICD-10-CM | POA: Diagnosis not present

## 2022-01-01 DIAGNOSIS — Z992 Dependence on renal dialysis: Secondary | ICD-10-CM | POA: Diagnosis not present

## 2022-01-03 ENCOUNTER — Other Ambulatory Visit: Payer: Self-pay

## 2022-01-03 ENCOUNTER — Telehealth: Payer: Self-pay | Admitting: Urology

## 2022-01-03 DIAGNOSIS — N401 Enlarged prostate with lower urinary tract symptoms: Secondary | ICD-10-CM

## 2022-01-03 MED ORDER — DUTASTERIDE 0.5 MG PO CAPS: 0.5000 mg | ORAL_CAPSULE | ORAL | 11 refills | Status: DC

## 2022-01-03 NOTE — Telephone Encounter (Signed)
Patient called regarding refill for Dutasteride 0.'5mg'$ . He is asking if he needs to continue medication or not? Refill request was denied last week. Please advise him.

## 2022-01-04 DIAGNOSIS — Z992 Dependence on renal dialysis: Secondary | ICD-10-CM | POA: Diagnosis not present

## 2022-01-04 DIAGNOSIS — N2581 Secondary hyperparathyroidism of renal origin: Secondary | ICD-10-CM | POA: Diagnosis not present

## 2022-01-04 DIAGNOSIS — N186 End stage renal disease: Secondary | ICD-10-CM | POA: Diagnosis not present

## 2022-01-06 DIAGNOSIS — N2581 Secondary hyperparathyroidism of renal origin: Secondary | ICD-10-CM | POA: Diagnosis not present

## 2022-01-06 DIAGNOSIS — N186 End stage renal disease: Secondary | ICD-10-CM | POA: Diagnosis not present

## 2022-01-06 DIAGNOSIS — Z992 Dependence on renal dialysis: Secondary | ICD-10-CM | POA: Diagnosis not present

## 2022-01-08 DIAGNOSIS — N186 End stage renal disease: Secondary | ICD-10-CM | POA: Diagnosis not present

## 2022-01-08 DIAGNOSIS — N2581 Secondary hyperparathyroidism of renal origin: Secondary | ICD-10-CM | POA: Diagnosis not present

## 2022-01-08 DIAGNOSIS — Z992 Dependence on renal dialysis: Secondary | ICD-10-CM | POA: Diagnosis not present

## 2022-01-11 DIAGNOSIS — N2581 Secondary hyperparathyroidism of renal origin: Secondary | ICD-10-CM | POA: Diagnosis not present

## 2022-01-11 DIAGNOSIS — N186 End stage renal disease: Secondary | ICD-10-CM | POA: Diagnosis not present

## 2022-01-11 DIAGNOSIS — Z992 Dependence on renal dialysis: Secondary | ICD-10-CM | POA: Diagnosis not present

## 2022-01-13 DIAGNOSIS — N2581 Secondary hyperparathyroidism of renal origin: Secondary | ICD-10-CM | POA: Diagnosis not present

## 2022-01-13 DIAGNOSIS — N186 End stage renal disease: Secondary | ICD-10-CM | POA: Diagnosis not present

## 2022-01-13 DIAGNOSIS — Z992 Dependence on renal dialysis: Secondary | ICD-10-CM | POA: Diagnosis not present

## 2022-01-14 ENCOUNTER — Ambulatory Visit: Payer: Medicare PPO

## 2022-01-15 DIAGNOSIS — N186 End stage renal disease: Secondary | ICD-10-CM | POA: Diagnosis not present

## 2022-01-15 DIAGNOSIS — N2581 Secondary hyperparathyroidism of renal origin: Secondary | ICD-10-CM | POA: Diagnosis not present

## 2022-01-15 DIAGNOSIS — Z992 Dependence on renal dialysis: Secondary | ICD-10-CM | POA: Diagnosis not present

## 2022-01-17 DIAGNOSIS — Z992 Dependence on renal dialysis: Secondary | ICD-10-CM | POA: Diagnosis not present

## 2022-01-17 DIAGNOSIS — N2581 Secondary hyperparathyroidism of renal origin: Secondary | ICD-10-CM | POA: Diagnosis not present

## 2022-01-17 DIAGNOSIS — N186 End stage renal disease: Secondary | ICD-10-CM | POA: Diagnosis not present

## 2022-01-19 DIAGNOSIS — Z992 Dependence on renal dialysis: Secondary | ICD-10-CM | POA: Diagnosis not present

## 2022-01-19 DIAGNOSIS — N2581 Secondary hyperparathyroidism of renal origin: Secondary | ICD-10-CM | POA: Diagnosis not present

## 2022-01-19 DIAGNOSIS — N186 End stage renal disease: Secondary | ICD-10-CM | POA: Diagnosis not present

## 2022-01-22 DIAGNOSIS — Z992 Dependence on renal dialysis: Secondary | ICD-10-CM | POA: Diagnosis not present

## 2022-01-22 DIAGNOSIS — N2581 Secondary hyperparathyroidism of renal origin: Secondary | ICD-10-CM | POA: Diagnosis not present

## 2022-01-22 DIAGNOSIS — N186 End stage renal disease: Secondary | ICD-10-CM | POA: Diagnosis not present

## 2022-01-25 DIAGNOSIS — N186 End stage renal disease: Secondary | ICD-10-CM | POA: Diagnosis not present

## 2022-01-25 DIAGNOSIS — Z992 Dependence on renal dialysis: Secondary | ICD-10-CM | POA: Diagnosis not present

## 2022-01-25 DIAGNOSIS — N2581 Secondary hyperparathyroidism of renal origin: Secondary | ICD-10-CM | POA: Diagnosis not present

## 2022-01-27 DIAGNOSIS — N2581 Secondary hyperparathyroidism of renal origin: Secondary | ICD-10-CM | POA: Diagnosis not present

## 2022-01-27 DIAGNOSIS — Z992 Dependence on renal dialysis: Secondary | ICD-10-CM | POA: Diagnosis not present

## 2022-01-27 DIAGNOSIS — N170 Acute kidney failure with tubular necrosis: Secondary | ICD-10-CM | POA: Diagnosis not present

## 2022-01-27 DIAGNOSIS — N186 End stage renal disease: Secondary | ICD-10-CM | POA: Diagnosis not present

## 2022-01-28 ENCOUNTER — Ambulatory Visit (INDEPENDENT_AMBULATORY_CARE_PROVIDER_SITE_OTHER): Payer: Medicare PPO | Admitting: Physician Assistant

## 2022-01-28 VITALS — BP 143/86 | HR 85 | Temp 97.3°F | Resp 16 | Ht 77.0 in | Wt 213.0 lb

## 2022-01-28 DIAGNOSIS — Z992 Dependence on renal dialysis: Secondary | ICD-10-CM

## 2022-01-28 DIAGNOSIS — N186 End stage renal disease: Secondary | ICD-10-CM

## 2022-01-28 NOTE — Progress Notes (Signed)
POST OPERATIVE OFFICE NOTE    CC:  F/u for surgery  HPI:  Austin Wheeler is a 81 y.o. male who is s/p right second stage basilic vein transposition on 12/13/2021 by Dr. Carlis Abbott.  His first stage procedure was on 10/15/2021.  Postoperatively he had issues with extensive bruising in the right upper arm but the fistula was working well.  He also had a small superficial skin erosion and was educated on dressing changes with short interval follow-up.  At his last follow-up with [place] the right arm was improving and the superficial skin erosion was a small dry scab.  He had no symptoms of steal and no issues with bleeding.  He still dialyzes on Tuesdays, Thursdays, and Saturdays via [facility] that was placed by IR on 05/03/2021.  Pt returns today for follow up.  Pt and his wife state that his arm has fully healed now.  There has been no more issues with bruising or skin ulcerations.  He still does not have any steal symptoms.   Allergies  Allergen Reactions   Guaifenesin Swelling and Rash    Hand and feet swelling from Robitussin 50 years ago   Robitussin Dm Max Day-Night Other (See Comments)    Other reaction(s): feet and leg swelling 50 years ago   Lexapro [Escitalopram] Nausea And Vomiting    Reported by [name] - pt does not recall    Current Outpatient Medications  Medication Sig Dispense Refill   acetaminophen (TYLENOL) 500 MG tablet Take 500 mg by mouth 2 (two) times daily.     apixaban (ELIQUIS) 2.5 MG TABS tablet Take 1 tablet (2.5 mg total) by mouth 2 (two) times daily. 60 tablet    aspirin EC 81 MG tablet Take 81 mg by mouth daily.     atropine 1 % ophthalmic solution Place 1 drop into the right eye at bedtime.     brimonidine (ALPHAGAN) 0.2 % ophthalmic solution Place 1 drop into the left eye 3 (three) times daily.     dorzolamide-timolol (COSOPT) 22.3-6.8 MG/ML ophthalmic solution Place 1 drop into the left eye 2 (two) times daily.     doxazosin (CARDURA) 8 MG tablet Take 1  tablet (8 mg total) by mouth daily. (Patient taking differently: Take 8 mg by mouth at bedtime. DO NO TAKE :If blood pressure is below 120) 90 tablet 3   dutasteride (AVODART) 0.5 MG capsule Take 1 capsule (0.5 mg total) by mouth every Monday, Wednesday, and Friday. 13 capsule 11   latanoprost (XALATAN) 0.005 % ophthalmic solution Place 1 drop into the left eye at bedtime.     multivitamin (RENA-VIT) TABS tablet Take 1 tablet by mouth daily.     Nutritional Supplements (FEEDING SUPPLEMENT, NEPRO CARB STEADY,) LIQD Take 237 mLs by mouth daily as needed (Supplement).     olmesartan-hydrochlorothiazide (BENICAR HCT) 40-12.5 MG tablet Take 1 tablet by mouth at bedtime. Take if blood pressure is above 120 take with Doxazosin 8 mg     pilocarpine (PILOCAR) 4 % ophthalmic solution Place 1 drop into the left eye 3 (three) times daily.  11   prednisoLONE acetate (PRED FORTE) 1 % ophthalmic suspension Place 1 drop into the right eye at bedtime.     traMADol (ULTRAM) 50 MG tablet Take 1 tablet (50 mg total) by mouth every 6 (six) hours as needed for severe pain. 20 tablet 0   triamcinolone ointment (KENALOG) 0.1 % Apply 1 Application topically 2 (two) times daily as needed (wound care).  No current facility-administered medications for this visit.     ROS:  See HPI  Physical Exam:  Incision: Right upper extremity incisions well-healed.  Dermabond and scabbing is completely gone. Extremities: Right upper extremity bruising has greatly improved with minimal bruising remaining in the right axilla.  Right upper extremity fistula with palpable thrill.  2+ radial pulse in right upper extremity Neuro: Right hand grip strength 5/5 with intact sensation    Assessment/Plan:  This is a 81 y.o. male who is s/p: Right second stage basilic vein transposition on 12/13/2021.    -The patient's right upper extremity bruising has almost completely resolved now.  His incisions are well-healed and his superficial skin  erosion is now gone.  There are no concerns for bleeding or infection at this time. -Right upper extremity fistula with palpable thrill on exam -His first-aid procedure was on 10/15/2021.  Now that his right arm has fully healed, I believe that the dialysis center can attempt to use the fistula for the first time on Tuesday, December 5th. -If the fistula functions well for at least 2 sessions, the patient's nephrologist can arrange for [facility] removal -He can follow-up with our office as needed   Vicente Serene, PA-C [facility] [phone]  Clinic MD:  Virl Cagey

## 2022-01-29 DIAGNOSIS — N2581 Secondary hyperparathyroidism of renal origin: Secondary | ICD-10-CM | POA: Diagnosis not present

## 2022-01-29 DIAGNOSIS — N186 End stage renal disease: Secondary | ICD-10-CM | POA: Diagnosis not present

## 2022-01-29 DIAGNOSIS — Z992 Dependence on renal dialysis: Secondary | ICD-10-CM | POA: Diagnosis not present

## 2022-02-01 DIAGNOSIS — N186 End stage renal disease: Secondary | ICD-10-CM | POA: Diagnosis not present

## 2022-02-01 DIAGNOSIS — N2581 Secondary hyperparathyroidism of renal origin: Secondary | ICD-10-CM | POA: Diagnosis not present

## 2022-02-01 DIAGNOSIS — Z992 Dependence on renal dialysis: Secondary | ICD-10-CM | POA: Diagnosis not present

## 2022-02-03 DIAGNOSIS — N186 End stage renal disease: Secondary | ICD-10-CM | POA: Diagnosis not present

## 2022-02-03 DIAGNOSIS — N2581 Secondary hyperparathyroidism of renal origin: Secondary | ICD-10-CM | POA: Diagnosis not present

## 2022-02-03 DIAGNOSIS — Z992 Dependence on renal dialysis: Secondary | ICD-10-CM | POA: Diagnosis not present

## 2022-02-05 DIAGNOSIS — N2581 Secondary hyperparathyroidism of renal origin: Secondary | ICD-10-CM | POA: Diagnosis not present

## 2022-02-05 DIAGNOSIS — Z992 Dependence on renal dialysis: Secondary | ICD-10-CM | POA: Diagnosis not present

## 2022-02-05 DIAGNOSIS — N186 End stage renal disease: Secondary | ICD-10-CM | POA: Diagnosis not present

## 2022-02-08 DIAGNOSIS — Z992 Dependence on renal dialysis: Secondary | ICD-10-CM | POA: Diagnosis not present

## 2022-02-08 DIAGNOSIS — N2581 Secondary hyperparathyroidism of renal origin: Secondary | ICD-10-CM | POA: Diagnosis not present

## 2022-02-08 DIAGNOSIS — N186 End stage renal disease: Secondary | ICD-10-CM | POA: Diagnosis not present

## 2022-02-10 DIAGNOSIS — N2581 Secondary hyperparathyroidism of renal origin: Secondary | ICD-10-CM | POA: Diagnosis not present

## 2022-02-10 DIAGNOSIS — Z992 Dependence on renal dialysis: Secondary | ICD-10-CM | POA: Diagnosis not present

## 2022-02-10 DIAGNOSIS — N186 End stage renal disease: Secondary | ICD-10-CM | POA: Diagnosis not present

## 2022-02-12 DIAGNOSIS — Z992 Dependence on renal dialysis: Secondary | ICD-10-CM | POA: Diagnosis not present

## 2022-02-12 DIAGNOSIS — N2581 Secondary hyperparathyroidism of renal origin: Secondary | ICD-10-CM | POA: Diagnosis not present

## 2022-02-12 DIAGNOSIS — N186 End stage renal disease: Secondary | ICD-10-CM | POA: Diagnosis not present

## 2022-02-15 DIAGNOSIS — Z992 Dependence on renal dialysis: Secondary | ICD-10-CM | POA: Diagnosis not present

## 2022-02-15 DIAGNOSIS — N2581 Secondary hyperparathyroidism of renal origin: Secondary | ICD-10-CM | POA: Diagnosis not present

## 2022-02-15 DIAGNOSIS — N186 End stage renal disease: Secondary | ICD-10-CM | POA: Diagnosis not present

## 2022-02-16 ENCOUNTER — Emergency Department (HOSPITAL_COMMUNITY)
Admission: EM | Admit: 2022-02-16 | Discharge: 2022-02-16 | Disposition: A | Discharge status: Home / Self Care | Payer: Medicare PPO | Attending: Emergency Medicine | Admitting: Emergency Medicine

## 2022-02-16 ENCOUNTER — Encounter (HOSPITAL_COMMUNITY): Payer: Self-pay

## 2022-02-16 ENCOUNTER — Other Ambulatory Visit: Payer: Self-pay

## 2022-02-16 DIAGNOSIS — Z992 Dependence on renal dialysis: Secondary | ICD-10-CM | POA: Diagnosis not present

## 2022-02-16 DIAGNOSIS — E119 Type 2 diabetes mellitus without complications: Secondary | ICD-10-CM | POA: Insufficient documentation

## 2022-02-16 DIAGNOSIS — I4891 Unspecified atrial fibrillation: Secondary | ICD-10-CM | POA: Insufficient documentation

## 2022-02-16 DIAGNOSIS — Z7982 Long term (current) use of aspirin: Secondary | ICD-10-CM | POA: Diagnosis not present

## 2022-02-16 DIAGNOSIS — N186 End stage renal disease: Secondary | ICD-10-CM | POA: Insufficient documentation

## 2022-02-16 DIAGNOSIS — T82838A Hemorrhage of vascular prosthetic devices, implants and grafts, initial encounter: Secondary | ICD-10-CM | POA: Insufficient documentation

## 2022-02-16 DIAGNOSIS — Z7901 Long term (current) use of anticoagulants: Secondary | ICD-10-CM | POA: Insufficient documentation

## 2022-02-16 LAB — CBC WITH DIFFERENTIAL/PLATELET
Abs Immature Granulocytes: 0.01 10*3/uL (ref 0.00–0.07)
Basophils Absolute: 0 10*3/uL (ref 0.0–0.1)
Basophils Relative: 1 %
Eosinophils Absolute: 0.2 10*3/uL (ref 0.0–0.5)
Eosinophils Relative: 3 %
HCT: 37.8 % — ABNORMAL LOW (ref 39.0–52.0)
Hemoglobin: 12.3 g/dL — ABNORMAL LOW (ref 13.0–17.0)
Immature Granulocytes: 0 %
Lymphocytes Relative: 17 %
Lymphs Abs: 0.9 10*3/uL (ref 0.7–4.0)
MCH: 31.5 pg (ref 26.0–34.0)
MCHC: 32.5 g/dL (ref 30.0–36.0)
MCV: 96.9 fL (ref 80.0–100.0)
Monocytes Absolute: 0.8 10*3/uL (ref 0.1–1.0)
Monocytes Relative: 14 %
Neutro Abs: 3.7 10*3/uL (ref 1.7–7.7)
Neutrophils Relative %: 65 %
Platelets: 152 10*3/uL (ref 150–400)
RBC: 3.9 MIL/uL — ABNORMAL LOW (ref 4.22–5.81)
RDW: 14.6 % (ref 11.5–15.5)
WBC: 5.6 10*3/uL (ref 4.0–10.5)
nRBC: 0 % (ref 0.0–0.2)

## 2022-02-16 LAB — PROTIME-INR
INR: 1.5 — ABNORMAL HIGH (ref 0.8–1.2)
Prothrombin Time: 18.4 seconds — ABNORMAL HIGH (ref 11.4–15.2)

## 2022-02-16 LAB — BASIC METABOLIC PANEL
Anion gap: 13 (ref 5–15)
BUN: 46 mg/dL — ABNORMAL HIGH (ref 8–23)
CO2: 25 mmol/L (ref 22–32)
Calcium: 9.6 mg/dL (ref 8.9–10.3)
Chloride: 103 mmol/L (ref 98–111)
Creatinine, Ser: 7.63 mg/dL — ABNORMAL HIGH (ref 0.61–1.24)
GFR, Estimated: 7 mL/min — ABNORMAL LOW (ref >60)
Glucose, Bld: 157 mg/dL — ABNORMAL HIGH (ref 70–99)
Potassium: 4.3 mmol/L (ref 3.5–5.1)
Sodium: 141 mmol/L (ref 135–145)

## 2022-02-16 NOTE — ED Notes (Signed)
Dr Darl Householder came to bedside to assess the patient and cleared him for discharge. Pt and wife verbalized understanding of d/c instructions and follow up care. No more bleeding from fistula.

## 2022-02-16 NOTE — ED Provider Notes (Signed)
[place] EMERGENCY DEPARTMENT Provider Note   CSN: [id number] Arrival date & time: 02/16/22  0865     History  Chief Complaint  Patient presents with   Vascular Access Problem    Austin Wheeler is a 81 y.o. male with history of ESRD on dialysis (last HD was yesterday), diabetes, A-fib on Eliquis, here presenting with bleeding fistula.  Patient states that he had dialysis yesterday.  He states that his wife change the dressing today around 4 PM.  She then noticed that he was bleeding profusely.  She wrapped it up with gauze and brought him straight over for evaluation.  Patient just had the fistula surgery done about 2 months ago with Dr. Carlis Abbott.  The history is provided by the patient.       Home Medications Prior to Admission medications   Medication Sig Start Date End Date Taking? Authorizing Provider  acetaminophen (TYLENOL) 500 MG tablet Take 500 mg by mouth 2 (two) times daily.    [provider]  apixaban (ELIQUIS) 2.5 MG TABS tablet Take 1 tablet (2.5 mg total) by mouth 2 (two) times daily. 05/14/21   Nolberto Hanlon, MD  aspirin EC 81 MG tablet Take 81 mg by mouth daily.    [provider]  atropine 1 % ophthalmic solution Place 1 drop into the right eye at bedtime. 11/11/19   [provider]  brimonidine (ALPHAGAN) 0.2 % ophthalmic solution Place 1 drop into the left eye 3 (three) times daily. 02/22/21   [provider]  dorzolamide-timolol (COSOPT) 22.3-6.8 MG/ML ophthalmic solution Place 1 drop into the left eye 2 (two) times daily. 04/11/12   [provider]  doxazosin (CARDURA) 8 MG tablet Take 1 tablet (8 mg total) by mouth daily. Patient taking differently: Take 8 mg by mouth at bedtime. DO NO TAKE :If blood pressure is below 120 11/13/20   Stoioff, Nicki Reaper C, MD  dutasteride (AVODART) 0.5 MG capsule Take 1 capsule (0.5 mg total) by mouth every Monday, Wednesday, and Friday. 01/03/22   Stoioff, Ronda Fairly, MD   latanoprost (XALATAN) 0.005 % ophthalmic solution Place 1 drop into the left eye at bedtime. 04/11/12   [provider]  multivitamin (RENA-VIT) TABS tablet Take 1 tablet by mouth daily. 07/02/21   [provider]  Nutritional Supplements (FEEDING SUPPLEMENT, NEPRO CARB STEADY,) LIQD Take 237 mLs by mouth daily as needed (Supplement).    [provider]  olmesartan-hydrochlorothiazide (BENICAR HCT) 40-12.5 MG tablet Take 1 tablet by mouth at bedtime. Take if blood pressure is above 120 take with Doxazosin 8 mg 05/21/21   [provider]  pilocarpine (PILOCAR) 4 % ophthalmic solution Place 1 drop into the left eye 3 (three) times daily. 06/20/17   [provider]  prednisoLONE acetate (PRED FORTE) 1 % ophthalmic suspension Place 1 drop into the right eye at bedtime. 11/12/19   [provider]  traMADol (ULTRAM) 50 MG tablet Take 1 tablet (50 mg total) by mouth every 6 (six) hours as needed for severe pain. 12/13/21 12/13/22  Baglia, Corrina, PA-C  triamcinolone ointment (KENALOG) 0.1 % Apply 1 Application topically 2 (two) times daily as needed (wound care).    [provider]      Allergies    Guaifenesin, Robitussin dm max day-night, and Lexapro [escitalopram]    Review of Systems   Review of Systems  Skin:        Bleeding fistula  All other systems reviewed and are negative.  Physical Exam Updated Vital Signs BP (!) 163/103 (BP Location: Left Arm)   Pulse 97   Resp 17   Ht '6\' 5"'$  (1.956 m)   Wt 96.6 kg   SpO2 100%   BMI 25.26 kg/m  Physical Exam Vitals and nursing note reviewed.  Constitutional:      Comments: Chronically ill and blind  HENT:     Head: Normocephalic.     Nose: Nose normal.     Mouth/Throat:     Mouth: Mucous membranes are moist.  Eyes:     Extraocular Movements: Extraocular movements intact.     Pupils: Pupils are equal, round, and reactive to light.  Cardiovascular:     Rate and Rhythm: Normal  rate.     Pulses: Normal pulses.  Pulmonary:     Effort: Pulmonary effort is normal.  Abdominal:     General: Abdomen is flat.  Musculoskeletal:     Cervical back: Normal range of motion.     Comments: Right arm fistula with a pinhole that is actively bleeding.  There is no obvious ulcer.  Fistula has good thrill above and below the bleeding site  Skin:    General: Skin is warm.  Neurological:     General: No focal deficit present.     Mental Status: He is alert and oriented to person, place, and time.  Psychiatric:        Mood and Affect: Mood normal.        Behavior: Behavior normal.     ED Results / Procedures / Treatments   Labs (all labs ordered are listed, but only abnormal results are displayed) Labs Reviewed  CBC WITH DIFFERENTIAL/PLATELET - Abnormal; Notable for the following components:      Result Value   RBC 3.90 (*)    Hemoglobin 12.3 (*)    HCT 37.8 (*)    All other components within normal limits  BASIC METABOLIC PANEL  PROTIME-INR    EKG None  Radiology No results found.  Procedures Procedures    Bleeding control  There is a pinhole in the AV fistula that was actively bleeding.  I did 2 figure-of-eight stitches with 5-0 Ethilon sutures.  The bleeding stopped afterwards.  There is thrill above and below the suture site.   Medications Ordered in ED Medications - No data to display  ED Course/ Medical Decision Making/ A&P                           Medical Decision Making Austin Wheeler is a 81 y.o. male here with bleeding fistula.  Patient is on Eliquis.  I was unable to control the bleeding with pressure and combat gauze.  I had to put 2 figure of 8 stitches into the skin.  There is no ulcer that is visible.  After I sutured it, there is good thrill above and below it.  I did talk to Dr. Carlis Abbott from vascular surgery.  He reviewed the pictures and agree that this is just a pinhole and there is no ulcers visible.  He states that if the fistula is  operational and there is good thrill, he does not need to see the patient today.  Patient has dialysis tomorrow.  I told the patient that the sutures need to be removed in 7 days.  8:42 PM Patient's hemoglobin is 12.3.  Baseline is around 14.  Creatinine is 7 which is baseline and potassium is 4.3.  I  reassess his dressing and there is no bleeding.  Patient stable for discharge and the fistula can be used.  Suture removal in 7 days.  Problems Addressed: Bleeding pseudoaneurysm of right brachiocephalic AV fistula ([place]): acute illness or injury  Amount and/or Complexity of Data Reviewed Labs: ordered. Decision-making details documented in ED Course.    Final Clinical Impression(s) / ED Diagnoses Final diagnoses:  None    Rx / DC Orders ED Discharge Orders     None         Drenda Freeze, MD 02/16/22 2043

## 2022-02-16 NOTE — ED Triage Notes (Signed)
Pt arrives with squirting and uncontrolled bleeding from dialysis fistula in his right arm. He states it has been bleeding since 4pm. He had dialysis yesterday. He reports he is on 2 blood thinners.   Pressure dressing applied immediately. Hildred Alamin PA and Dr. Darl Householder at bedside immediately. Dr. Darl Householder inserted sutures to control the bleeding.   Pt A&OX4.

## 2022-02-16 NOTE — Discharge Instructions (Addendum)
You have a bleeding fistula that was sutured.  Your fistula may be used.  Suture removal in 7 days  See your nephrologist and your vascular doctor for follow-up  Return to ER if you have uncontrolled bleeding, severe arm pain or fingers turning blue.

## 2022-02-16 NOTE — ED Provider Triage Note (Signed)
Emergency Medicine Provider Triage Evaluation Note  Austin Wheeler , a 81 y.o. male  was evaluated in triage.  Pt complains of bleeding through the fistula graft right upper extremity for the last few hours.  He is on blood thinners, aspirin.  Feels lightheaded but does not lost consciousness..  Review of Systems  Per HPI  Physical Exam  BP (!) 163/103 (BP Location: Left Arm)   Pulse 97   Resp 17   Ht '6\' 5"'$  (1.956 m)   Wt 96.6 kg   SpO2 100%   BMI 25.26 kg/m  Gen:   Awake, no distress   Resp:  Normal effort  MSK:   Moves extremities without difficulty  Other:  [place] bleeding from right upper extremity fistula    Medical Decision Making  Medically screening exam initiated at 7:39 PM.  Appropriate orders placed.  Austin Wheeler was informed that the remainder of the evaluation will be completed by another provider, this initial triage assessment does not replace that evaluation, and the importance of remaining in the ED until their evaluation is complete.  Bleeding controlled with figure-of-eight knot, bulky pressure dressing.   Sherrill Raring, PA-C 02/16/22 1940

## 2022-02-17 DIAGNOSIS — N186 End stage renal disease: Secondary | ICD-10-CM | POA: Diagnosis not present

## 2022-02-17 DIAGNOSIS — Z992 Dependence on renal dialysis: Secondary | ICD-10-CM | POA: Diagnosis not present

## 2022-02-17 DIAGNOSIS — N2581 Secondary hyperparathyroidism of renal origin: Secondary | ICD-10-CM | POA: Diagnosis not present

## 2022-02-19 DIAGNOSIS — Z992 Dependence on renal dialysis: Secondary | ICD-10-CM | POA: Diagnosis not present

## 2022-02-19 DIAGNOSIS — N2581 Secondary hyperparathyroidism of renal origin: Secondary | ICD-10-CM | POA: Diagnosis not present

## 2022-02-19 DIAGNOSIS — N186 End stage renal disease: Secondary | ICD-10-CM | POA: Diagnosis not present

## 2022-02-22 DIAGNOSIS — Z992 Dependence on renal dialysis: Secondary | ICD-10-CM | POA: Diagnosis not present

## 2022-02-22 DIAGNOSIS — N2581 Secondary hyperparathyroidism of renal origin: Secondary | ICD-10-CM | POA: Diagnosis not present

## 2022-02-22 DIAGNOSIS — N186 End stage renal disease: Secondary | ICD-10-CM | POA: Diagnosis not present

## 2022-02-22 NOTE — Progress Notes (Unsigned)
    Postoperative Access Visit   History of Present Illness   Austin Wheeler is a 81 y.o. year old male who presents for postoperative follow-up for: right second stage basilic vein transposition on 12/13/2021 by Dr. Carlis Abbott. His first stage procedure was on 10/15/2021. He recently presented to ER on 12/20 with bleeding from pinhole in fistula. He was evaluated in [facility] ED and discharged home. Bleeding was controlled with 2 figure of 8 sutures, there was no need for surgical intervention. He is on Aspirin and Eliquis.   Today he reports no recurrent bleeding. They have been using [facility] for dialysis since his ER visit. He denies any pain in the right upper arm  He dialyzes on Tuesdays, Thursdays, and Saturdays via [facility] that was placed by IR on 05/03/2021.   Physical Examination   Vitals:   02/23/22 0847  BP: (!) 143/90  Pulse: 84  Temp: 98 F (36.7 C)  TempSrc: Temporal  SpO2: 98%  Weight: 213 lb (96.6 kg)  Height: '6\' 5"'$  (1.956 m)   Body mass index is 25.26 kg/m.  right arm The small pin hole is healed, sutures removed, 2+ radial pulse, hand grip is 5/5, sensation in digits is intact,palpable thrill, bruit can be auscultated     Medical Decision Making   Austin Wheeler is a 81 y.o. year old male who presents s/p right second stage basilic vein transposition on 12/13/2021 by Dr. Carlis Abbott. He has no aneurysmal areas or ulceration of his fistula. The small area that was bleeding is just a stick site that was suture ligated in ER. No recurrent bleeding. Sutures removed today. Fistula is working well. Patent is without signs or symptoms of steal syndrome. Fistula can be access again as of 03/01/22.  The patient may follow up on a prn basis   Karoline Caldwell, PA-C [facility] [place] Office: [phone]  Clinic MD: Scot Dock

## 2022-02-23 ENCOUNTER — Ambulatory Visit (INDEPENDENT_AMBULATORY_CARE_PROVIDER_SITE_OTHER): Payer: Medicare PPO | Admitting: Physician Assistant

## 2022-02-23 ENCOUNTER — Encounter: Payer: Self-pay | Admitting: Physician Assistant

## 2022-02-23 VITALS — BP 143/90 | HR 84 | Temp 98.0°F | Ht 77.0 in | Wt 213.0 lb

## 2022-02-23 DIAGNOSIS — Z992 Dependence on renal dialysis: Secondary | ICD-10-CM

## 2022-02-23 DIAGNOSIS — N186 End stage renal disease: Secondary | ICD-10-CM

## 2022-02-24 DIAGNOSIS — Z992 Dependence on renal dialysis: Secondary | ICD-10-CM | POA: Diagnosis not present

## 2022-02-24 DIAGNOSIS — N2581 Secondary hyperparathyroidism of renal origin: Secondary | ICD-10-CM | POA: Diagnosis not present

## 2022-02-24 DIAGNOSIS — N186 End stage renal disease: Secondary | ICD-10-CM | POA: Diagnosis not present

## 2022-02-26 DIAGNOSIS — Z992 Dependence on renal dialysis: Secondary | ICD-10-CM | POA: Diagnosis not present

## 2022-02-26 DIAGNOSIS — N2581 Secondary hyperparathyroidism of renal origin: Secondary | ICD-10-CM | POA: Diagnosis not present

## 2022-02-26 DIAGNOSIS — N186 End stage renal disease: Secondary | ICD-10-CM | POA: Diagnosis not present

## 2022-02-27 DIAGNOSIS — N170 Acute kidney failure with tubular necrosis: Secondary | ICD-10-CM | POA: Diagnosis not present

## 2022-02-27 DIAGNOSIS — Z992 Dependence on renal dialysis: Secondary | ICD-10-CM | POA: Diagnosis not present

## 2022-02-27 DIAGNOSIS — N186 End stage renal disease: Secondary | ICD-10-CM | POA: Diagnosis not present

## 2022-03-01 DIAGNOSIS — Z992 Dependence on renal dialysis: Secondary | ICD-10-CM | POA: Diagnosis not present

## 2022-03-01 DIAGNOSIS — N2581 Secondary hyperparathyroidism of renal origin: Secondary | ICD-10-CM | POA: Diagnosis not present

## 2022-03-01 DIAGNOSIS — N186 End stage renal disease: Secondary | ICD-10-CM | POA: Diagnosis not present

## 2022-03-03 DIAGNOSIS — N186 End stage renal disease: Secondary | ICD-10-CM | POA: Diagnosis not present

## 2022-03-03 DIAGNOSIS — Z992 Dependence on renal dialysis: Secondary | ICD-10-CM | POA: Diagnosis not present

## 2022-03-03 DIAGNOSIS — N2581 Secondary hyperparathyroidism of renal origin: Secondary | ICD-10-CM | POA: Diagnosis not present

## 2022-03-05 DIAGNOSIS — Z992 Dependence on renal dialysis: Secondary | ICD-10-CM | POA: Diagnosis not present

## 2022-03-05 DIAGNOSIS — N186 End stage renal disease: Secondary | ICD-10-CM | POA: Diagnosis not present

## 2022-03-05 DIAGNOSIS — N2581 Secondary hyperparathyroidism of renal origin: Secondary | ICD-10-CM | POA: Diagnosis not present

## 2022-03-08 DIAGNOSIS — N2581 Secondary hyperparathyroidism of renal origin: Secondary | ICD-10-CM | POA: Diagnosis not present

## 2022-03-08 DIAGNOSIS — N186 End stage renal disease: Secondary | ICD-10-CM | POA: Diagnosis not present

## 2022-03-08 DIAGNOSIS — Z992 Dependence on renal dialysis: Secondary | ICD-10-CM | POA: Diagnosis not present

## 2022-03-10 DIAGNOSIS — N2581 Secondary hyperparathyroidism of renal origin: Secondary | ICD-10-CM | POA: Diagnosis not present

## 2022-03-10 DIAGNOSIS — Z992 Dependence on renal dialysis: Secondary | ICD-10-CM | POA: Diagnosis not present

## 2022-03-10 DIAGNOSIS — N186 End stage renal disease: Secondary | ICD-10-CM | POA: Diagnosis not present

## 2022-03-12 DIAGNOSIS — Z992 Dependence on renal dialysis: Secondary | ICD-10-CM | POA: Diagnosis not present

## 2022-03-12 DIAGNOSIS — N2581 Secondary hyperparathyroidism of renal origin: Secondary | ICD-10-CM | POA: Diagnosis not present

## 2022-03-12 DIAGNOSIS — N186 End stage renal disease: Secondary | ICD-10-CM | POA: Diagnosis not present

## 2022-03-14 DIAGNOSIS — N186 End stage renal disease: Secondary | ICD-10-CM | POA: Diagnosis not present

## 2022-03-14 DIAGNOSIS — Z992 Dependence on renal dialysis: Secondary | ICD-10-CM | POA: Diagnosis not present

## 2022-03-14 DIAGNOSIS — I871 Compression of vein: Secondary | ICD-10-CM | POA: Diagnosis not present

## 2022-03-15 DIAGNOSIS — N2581 Secondary hyperparathyroidism of renal origin: Secondary | ICD-10-CM | POA: Diagnosis not present

## 2022-03-15 DIAGNOSIS — Z992 Dependence on renal dialysis: Secondary | ICD-10-CM | POA: Diagnosis not present

## 2022-03-15 DIAGNOSIS — N186 End stage renal disease: Secondary | ICD-10-CM | POA: Diagnosis not present

## 2022-03-17 DIAGNOSIS — N186 End stage renal disease: Secondary | ICD-10-CM | POA: Diagnosis not present

## 2022-03-17 DIAGNOSIS — Z992 Dependence on renal dialysis: Secondary | ICD-10-CM | POA: Diagnosis not present

## 2022-03-17 DIAGNOSIS — N2581 Secondary hyperparathyroidism of renal origin: Secondary | ICD-10-CM | POA: Diagnosis not present

## 2022-03-19 DIAGNOSIS — Z992 Dependence on renal dialysis: Secondary | ICD-10-CM | POA: Diagnosis not present

## 2022-03-19 DIAGNOSIS — N2581 Secondary hyperparathyroidism of renal origin: Secondary | ICD-10-CM | POA: Diagnosis not present

## 2022-03-19 DIAGNOSIS — N186 End stage renal disease: Secondary | ICD-10-CM | POA: Diagnosis not present

## 2022-03-22 DIAGNOSIS — N2581 Secondary hyperparathyroidism of renal origin: Secondary | ICD-10-CM | POA: Diagnosis not present

## 2022-03-22 DIAGNOSIS — Z992 Dependence on renal dialysis: Secondary | ICD-10-CM | POA: Diagnosis not present

## 2022-03-22 DIAGNOSIS — N186 End stage renal disease: Secondary | ICD-10-CM | POA: Diagnosis not present

## 2022-03-23 DIAGNOSIS — I480 Paroxysmal atrial fibrillation: Secondary | ICD-10-CM | POA: Diagnosis not present

## 2022-03-23 DIAGNOSIS — C61 Malignant neoplasm of prostate: Secondary | ICD-10-CM | POA: Diagnosis not present

## 2022-03-23 DIAGNOSIS — H543 Unqualified visual loss, both eyes: Secondary | ICD-10-CM | POA: Diagnosis not present

## 2022-03-23 DIAGNOSIS — M109 Gout, unspecified: Secondary | ICD-10-CM | POA: Diagnosis not present

## 2022-03-23 DIAGNOSIS — N186 End stage renal disease: Secondary | ICD-10-CM | POA: Diagnosis not present

## 2022-03-23 DIAGNOSIS — I10 Essential (primary) hypertension: Secondary | ICD-10-CM | POA: Diagnosis not present

## 2022-03-23 DIAGNOSIS — H4050X0 Glaucoma secondary to other eye disorders, unspecified eye, stage unspecified: Secondary | ICD-10-CM | POA: Diagnosis not present

## 2022-03-23 DIAGNOSIS — Z992 Dependence on renal dialysis: Secondary | ICD-10-CM | POA: Diagnosis not present

## 2022-03-23 DIAGNOSIS — R195 Other fecal abnormalities: Secondary | ICD-10-CM | POA: Diagnosis not present

## 2022-03-24 DIAGNOSIS — N2581 Secondary hyperparathyroidism of renal origin: Secondary | ICD-10-CM | POA: Diagnosis not present

## 2022-03-24 DIAGNOSIS — N186 End stage renal disease: Secondary | ICD-10-CM | POA: Diagnosis not present

## 2022-03-24 DIAGNOSIS — Z992 Dependence on renal dialysis: Secondary | ICD-10-CM | POA: Diagnosis not present

## 2022-03-26 DIAGNOSIS — M6259 Muscle wasting and atrophy, not elsewhere classified, multiple sites: Secondary | ICD-10-CM | POA: Diagnosis not present

## 2022-03-26 DIAGNOSIS — N186 End stage renal disease: Secondary | ICD-10-CM | POA: Diagnosis not present

## 2022-03-26 DIAGNOSIS — N2581 Secondary hyperparathyroidism of renal origin: Secondary | ICD-10-CM | POA: Diagnosis not present

## 2022-03-26 DIAGNOSIS — Z992 Dependence on renal dialysis: Secondary | ICD-10-CM | POA: Diagnosis not present

## 2022-03-28 DIAGNOSIS — M6259 Muscle wasting and atrophy, not elsewhere classified, multiple sites: Secondary | ICD-10-CM | POA: Diagnosis not present

## 2022-03-29 DIAGNOSIS — N2581 Secondary hyperparathyroidism of renal origin: Secondary | ICD-10-CM | POA: Diagnosis not present

## 2022-03-29 DIAGNOSIS — N186 End stage renal disease: Secondary | ICD-10-CM | POA: Diagnosis not present

## 2022-03-29 DIAGNOSIS — Z992 Dependence on renal dialysis: Secondary | ICD-10-CM | POA: Diagnosis not present

## 2022-03-30 DIAGNOSIS — N170 Acute kidney failure with tubular necrosis: Secondary | ICD-10-CM | POA: Diagnosis not present

## 2022-03-30 DIAGNOSIS — N186 End stage renal disease: Secondary | ICD-10-CM | POA: Diagnosis not present

## 2022-03-30 DIAGNOSIS — Z992 Dependence on renal dialysis: Secondary | ICD-10-CM | POA: Diagnosis not present

## 2022-03-31 DIAGNOSIS — N2581 Secondary hyperparathyroidism of renal origin: Secondary | ICD-10-CM | POA: Diagnosis not present

## 2022-03-31 DIAGNOSIS — N186 End stage renal disease: Secondary | ICD-10-CM | POA: Diagnosis not present

## 2022-03-31 DIAGNOSIS — Z992 Dependence on renal dialysis: Secondary | ICD-10-CM | POA: Diagnosis not present

## 2022-04-02 DIAGNOSIS — Z992 Dependence on renal dialysis: Secondary | ICD-10-CM | POA: Diagnosis not present

## 2022-04-02 DIAGNOSIS — N186 End stage renal disease: Secondary | ICD-10-CM | POA: Diagnosis not present

## 2022-04-02 DIAGNOSIS — N2581 Secondary hyperparathyroidism of renal origin: Secondary | ICD-10-CM | POA: Diagnosis not present

## 2022-04-04 DIAGNOSIS — H401133 Primary open-angle glaucoma, bilateral, severe stage: Secondary | ICD-10-CM | POA: Diagnosis not present

## 2022-04-04 DIAGNOSIS — H4051X3 Glaucoma secondary to other eye disorders, right eye, severe stage: Secondary | ICD-10-CM | POA: Diagnosis not present

## 2022-04-05 DIAGNOSIS — Z992 Dependence on renal dialysis: Secondary | ICD-10-CM | POA: Diagnosis not present

## 2022-04-05 DIAGNOSIS — N2581 Secondary hyperparathyroidism of renal origin: Secondary | ICD-10-CM | POA: Diagnosis not present

## 2022-04-05 DIAGNOSIS — N186 End stage renal disease: Secondary | ICD-10-CM | POA: Diagnosis not present

## 2022-04-07 DIAGNOSIS — Z992 Dependence on renal dialysis: Secondary | ICD-10-CM | POA: Diagnosis not present

## 2022-04-07 DIAGNOSIS — N2581 Secondary hyperparathyroidism of renal origin: Secondary | ICD-10-CM | POA: Diagnosis not present

## 2022-04-07 DIAGNOSIS — N186 End stage renal disease: Secondary | ICD-10-CM | POA: Diagnosis not present

## 2022-04-09 DIAGNOSIS — Z992 Dependence on renal dialysis: Secondary | ICD-10-CM | POA: Diagnosis not present

## 2022-04-09 DIAGNOSIS — N186 End stage renal disease: Secondary | ICD-10-CM | POA: Diagnosis not present

## 2022-04-09 DIAGNOSIS — N2581 Secondary hyperparathyroidism of renal origin: Secondary | ICD-10-CM | POA: Diagnosis not present

## 2022-04-12 DIAGNOSIS — N186 End stage renal disease: Secondary | ICD-10-CM | POA: Diagnosis not present

## 2022-04-12 DIAGNOSIS — N2581 Secondary hyperparathyroidism of renal origin: Secondary | ICD-10-CM | POA: Diagnosis not present

## 2022-04-12 DIAGNOSIS — Z992 Dependence on renal dialysis: Secondary | ICD-10-CM | POA: Diagnosis not present

## 2022-04-13 ENCOUNTER — Encounter: Payer: Self-pay | Admitting: Podiatry

## 2022-04-13 ENCOUNTER — Ambulatory Visit: Payer: Medicare PPO | Admitting: Podiatry

## 2022-04-13 VITALS — BP 137/89 | HR 83

## 2022-04-13 DIAGNOSIS — E1169 Type 2 diabetes mellitus with other specified complication: Secondary | ICD-10-CM

## 2022-04-13 DIAGNOSIS — B351 Tinea unguium: Secondary | ICD-10-CM

## 2022-04-13 DIAGNOSIS — M79675 Pain in left toe(s): Secondary | ICD-10-CM | POA: Diagnosis not present

## 2022-04-13 DIAGNOSIS — M79674 Pain in right toe(s): Secondary | ICD-10-CM | POA: Diagnosis not present

## 2022-04-13 NOTE — Progress Notes (Signed)
This patient presents  to my office for at risk foot care.  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  This patient is taking eliquis.This patient is unable to cut nails himself since the patient cannot reach his nails.These nails are painful walking and wearing shoes. He presents to the office in a wheelchair. This patient presents for at risk foot care today.  General Appearance  Alert, conversant and in no acute stress.  Vascular  Dorsalis pedis and posterior tibial  pulses are  not palpable due to swelling  bilaterally.  Capillary return is within normal limits  bilaterally. Temperature is within normal limits  bilaterally.  Neurologic  Senn-Weinstein monofilament wire test diminished  bilaterally. Muscle power within normal limits bilaterally.  Nails Thick disfigured discolored nails with subungual debris  from hallux to fifth toes bilaterally. No evidence of bacterial infection or drainage bilaterally.  Orthopedic  No limitations of motion  feet .  No crepitus or effusions noted.  No bony pathology or digital deformities noted.  Skin  normotropic skin with no porokeratosis noted bilaterally.  No signs of infections or ulcers noted.     Onychomycosis  Pain in right toes  Pain in left toes  Consent was obtained for treatment procedures.   Mechanical debridement of nails 1-5  bilaterally performed with a nail nipper.  Filed with dremel without incident.    Return office visit   3  months                  Told patient to return for periodic foot care and evaluation due to potential at risk complications.   Gardiner Barefoot DPM

## 2022-04-14 DIAGNOSIS — Z992 Dependence on renal dialysis: Secondary | ICD-10-CM | POA: Diagnosis not present

## 2022-04-14 DIAGNOSIS — N2581 Secondary hyperparathyroidism of renal origin: Secondary | ICD-10-CM | POA: Diagnosis not present

## 2022-04-14 DIAGNOSIS — N186 End stage renal disease: Secondary | ICD-10-CM | POA: Diagnosis not present

## 2022-04-16 DIAGNOSIS — Z992 Dependence on renal dialysis: Secondary | ICD-10-CM | POA: Diagnosis not present

## 2022-04-16 DIAGNOSIS — N2581 Secondary hyperparathyroidism of renal origin: Secondary | ICD-10-CM | POA: Diagnosis not present

## 2022-04-16 DIAGNOSIS — N186 End stage renal disease: Secondary | ICD-10-CM | POA: Diagnosis not present

## 2022-04-19 DIAGNOSIS — N186 End stage renal disease: Secondary | ICD-10-CM | POA: Diagnosis not present

## 2022-04-19 DIAGNOSIS — N2581 Secondary hyperparathyroidism of renal origin: Secondary | ICD-10-CM | POA: Diagnosis not present

## 2022-04-19 DIAGNOSIS — Z992 Dependence on renal dialysis: Secondary | ICD-10-CM | POA: Diagnosis not present

## 2022-04-20 ENCOUNTER — Ambulatory Visit: Payer: Medicare PPO | Admitting: Podiatry

## 2022-04-21 DIAGNOSIS — N2581 Secondary hyperparathyroidism of renal origin: Secondary | ICD-10-CM | POA: Diagnosis not present

## 2022-04-21 DIAGNOSIS — N186 End stage renal disease: Secondary | ICD-10-CM | POA: Diagnosis not present

## 2022-04-21 DIAGNOSIS — Z992 Dependence on renal dialysis: Secondary | ICD-10-CM | POA: Diagnosis not present

## 2022-04-23 DIAGNOSIS — N186 End stage renal disease: Secondary | ICD-10-CM | POA: Diagnosis not present

## 2022-04-23 DIAGNOSIS — Z992 Dependence on renal dialysis: Secondary | ICD-10-CM | POA: Diagnosis not present

## 2022-04-23 DIAGNOSIS — N2581 Secondary hyperparathyroidism of renal origin: Secondary | ICD-10-CM | POA: Diagnosis not present

## 2022-04-25 DIAGNOSIS — I871 Compression of vein: Secondary | ICD-10-CM | POA: Diagnosis not present

## 2022-04-25 DIAGNOSIS — N186 End stage renal disease: Secondary | ICD-10-CM | POA: Diagnosis not present

## 2022-04-25 DIAGNOSIS — T82898A Other specified complication of vascular prosthetic devices, implants and grafts, initial encounter: Secondary | ICD-10-CM | POA: Diagnosis not present

## 2022-04-25 DIAGNOSIS — Z992 Dependence on renal dialysis: Secondary | ICD-10-CM | POA: Diagnosis not present

## 2022-04-26 DIAGNOSIS — N186 End stage renal disease: Secondary | ICD-10-CM | POA: Diagnosis not present

## 2022-04-26 DIAGNOSIS — M6259 Muscle wasting and atrophy, not elsewhere classified, multiple sites: Secondary | ICD-10-CM | POA: Diagnosis not present

## 2022-04-26 DIAGNOSIS — Z992 Dependence on renal dialysis: Secondary | ICD-10-CM | POA: Diagnosis not present

## 2022-04-26 DIAGNOSIS — N2581 Secondary hyperparathyroidism of renal origin: Secondary | ICD-10-CM | POA: Diagnosis not present

## 2022-04-28 DIAGNOSIS — N186 End stage renal disease: Secondary | ICD-10-CM | POA: Diagnosis not present

## 2022-04-28 DIAGNOSIS — N2581 Secondary hyperparathyroidism of renal origin: Secondary | ICD-10-CM | POA: Diagnosis not present

## 2022-04-28 DIAGNOSIS — M6259 Muscle wasting and atrophy, not elsewhere classified, multiple sites: Secondary | ICD-10-CM | POA: Diagnosis not present

## 2022-04-28 DIAGNOSIS — Z992 Dependence on renal dialysis: Secondary | ICD-10-CM | POA: Diagnosis not present

## 2022-04-28 DIAGNOSIS — N170 Acute kidney failure with tubular necrosis: Secondary | ICD-10-CM | POA: Diagnosis not present

## 2022-04-30 DIAGNOSIS — Z992 Dependence on renal dialysis: Secondary | ICD-10-CM | POA: Diagnosis not present

## 2022-04-30 DIAGNOSIS — N2581 Secondary hyperparathyroidism of renal origin: Secondary | ICD-10-CM | POA: Diagnosis not present

## 2022-04-30 DIAGNOSIS — N186 End stage renal disease: Secondary | ICD-10-CM | POA: Diagnosis not present

## 2022-05-03 DIAGNOSIS — Z992 Dependence on renal dialysis: Secondary | ICD-10-CM | POA: Diagnosis not present

## 2022-05-03 DIAGNOSIS — N2581 Secondary hyperparathyroidism of renal origin: Secondary | ICD-10-CM | POA: Diagnosis not present

## 2022-05-03 DIAGNOSIS — N186 End stage renal disease: Secondary | ICD-10-CM | POA: Diagnosis not present

## 2022-05-05 DIAGNOSIS — N186 End stage renal disease: Secondary | ICD-10-CM | POA: Diagnosis not present

## 2022-05-05 DIAGNOSIS — N2581 Secondary hyperparathyroidism of renal origin: Secondary | ICD-10-CM | POA: Diagnosis not present

## 2022-05-05 DIAGNOSIS — Z992 Dependence on renal dialysis: Secondary | ICD-10-CM | POA: Diagnosis not present

## 2022-05-07 DIAGNOSIS — N2581 Secondary hyperparathyroidism of renal origin: Secondary | ICD-10-CM | POA: Diagnosis not present

## 2022-05-07 DIAGNOSIS — N186 End stage renal disease: Secondary | ICD-10-CM | POA: Diagnosis not present

## 2022-05-07 DIAGNOSIS — Z992 Dependence on renal dialysis: Secondary | ICD-10-CM | POA: Diagnosis not present

## 2022-05-10 DIAGNOSIS — Z992 Dependence on renal dialysis: Secondary | ICD-10-CM | POA: Diagnosis not present

## 2022-05-10 DIAGNOSIS — N2581 Secondary hyperparathyroidism of renal origin: Secondary | ICD-10-CM | POA: Diagnosis not present

## 2022-05-10 DIAGNOSIS — N186 End stage renal disease: Secondary | ICD-10-CM | POA: Diagnosis not present

## 2022-05-12 DIAGNOSIS — N186 End stage renal disease: Secondary | ICD-10-CM | POA: Diagnosis not present

## 2022-05-12 DIAGNOSIS — Z992 Dependence on renal dialysis: Secondary | ICD-10-CM | POA: Diagnosis not present

## 2022-05-12 DIAGNOSIS — N2581 Secondary hyperparathyroidism of renal origin: Secondary | ICD-10-CM | POA: Diagnosis not present

## 2022-05-14 DIAGNOSIS — N186 End stage renal disease: Secondary | ICD-10-CM | POA: Diagnosis not present

## 2022-05-14 DIAGNOSIS — N2581 Secondary hyperparathyroidism of renal origin: Secondary | ICD-10-CM | POA: Diagnosis not present

## 2022-05-14 DIAGNOSIS — Z992 Dependence on renal dialysis: Secondary | ICD-10-CM | POA: Diagnosis not present

## 2022-05-17 DIAGNOSIS — N186 End stage renal disease: Secondary | ICD-10-CM | POA: Diagnosis not present

## 2022-05-17 DIAGNOSIS — Z992 Dependence on renal dialysis: Secondary | ICD-10-CM | POA: Diagnosis not present

## 2022-05-17 DIAGNOSIS — N2581 Secondary hyperparathyroidism of renal origin: Secondary | ICD-10-CM | POA: Diagnosis not present

## 2022-05-19 DIAGNOSIS — N186 End stage renal disease: Secondary | ICD-10-CM | POA: Diagnosis not present

## 2022-05-19 DIAGNOSIS — Z992 Dependence on renal dialysis: Secondary | ICD-10-CM | POA: Diagnosis not present

## 2022-05-19 DIAGNOSIS — N2581 Secondary hyperparathyroidism of renal origin: Secondary | ICD-10-CM | POA: Diagnosis not present

## 2022-05-21 DIAGNOSIS — N186 End stage renal disease: Secondary | ICD-10-CM | POA: Diagnosis not present

## 2022-05-21 DIAGNOSIS — N2581 Secondary hyperparathyroidism of renal origin: Secondary | ICD-10-CM | POA: Diagnosis not present

## 2022-05-21 DIAGNOSIS — Z992 Dependence on renal dialysis: Secondary | ICD-10-CM | POA: Diagnosis not present

## 2022-05-23 DIAGNOSIS — N186 End stage renal disease: Secondary | ICD-10-CM | POA: Diagnosis not present

## 2022-05-23 DIAGNOSIS — Z992 Dependence on renal dialysis: Secondary | ICD-10-CM | POA: Diagnosis not present

## 2022-05-23 DIAGNOSIS — I871 Compression of vein: Secondary | ICD-10-CM | POA: Diagnosis not present

## 2022-05-24 DIAGNOSIS — N2581 Secondary hyperparathyroidism of renal origin: Secondary | ICD-10-CM | POA: Diagnosis not present

## 2022-05-24 DIAGNOSIS — Z992 Dependence on renal dialysis: Secondary | ICD-10-CM | POA: Diagnosis not present

## 2022-05-24 DIAGNOSIS — N186 End stage renal disease: Secondary | ICD-10-CM | POA: Diagnosis not present

## 2022-05-25 DIAGNOSIS — M6259 Muscle wasting and atrophy, not elsewhere classified, multiple sites: Secondary | ICD-10-CM | POA: Diagnosis not present

## 2022-05-26 DIAGNOSIS — N2581 Secondary hyperparathyroidism of renal origin: Secondary | ICD-10-CM | POA: Diagnosis not present

## 2022-05-26 DIAGNOSIS — Z992 Dependence on renal dialysis: Secondary | ICD-10-CM | POA: Diagnosis not present

## 2022-05-26 DIAGNOSIS — N186 End stage renal disease: Secondary | ICD-10-CM | POA: Diagnosis not present

## 2022-05-27 DIAGNOSIS — M6259 Muscle wasting and atrophy, not elsewhere classified, multiple sites: Secondary | ICD-10-CM | POA: Diagnosis not present

## 2022-05-28 DIAGNOSIS — N2581 Secondary hyperparathyroidism of renal origin: Secondary | ICD-10-CM | POA: Diagnosis not present

## 2022-05-28 DIAGNOSIS — N186 End stage renal disease: Secondary | ICD-10-CM | POA: Diagnosis not present

## 2022-05-28 DIAGNOSIS — Z992 Dependence on renal dialysis: Secondary | ICD-10-CM | POA: Diagnosis not present

## 2022-05-29 DIAGNOSIS — N186 End stage renal disease: Secondary | ICD-10-CM | POA: Diagnosis not present

## 2022-05-29 DIAGNOSIS — N170 Acute kidney failure with tubular necrosis: Secondary | ICD-10-CM | POA: Diagnosis not present

## 2022-05-29 DIAGNOSIS — Z992 Dependence on renal dialysis: Secondary | ICD-10-CM | POA: Diagnosis not present

## 2022-05-31 DIAGNOSIS — Z992 Dependence on renal dialysis: Secondary | ICD-10-CM | POA: Diagnosis not present

## 2022-05-31 DIAGNOSIS — N2581 Secondary hyperparathyroidism of renal origin: Secondary | ICD-10-CM | POA: Diagnosis not present

## 2022-05-31 DIAGNOSIS — N186 End stage renal disease: Secondary | ICD-10-CM | POA: Diagnosis not present

## 2022-06-02 DIAGNOSIS — Z992 Dependence on renal dialysis: Secondary | ICD-10-CM | POA: Diagnosis not present

## 2022-06-02 DIAGNOSIS — N186 End stage renal disease: Secondary | ICD-10-CM | POA: Diagnosis not present

## 2022-06-02 DIAGNOSIS — N2581 Secondary hyperparathyroidism of renal origin: Secondary | ICD-10-CM | POA: Diagnosis not present

## 2022-06-04 DIAGNOSIS — N2581 Secondary hyperparathyroidism of renal origin: Secondary | ICD-10-CM | POA: Diagnosis not present

## 2022-06-04 DIAGNOSIS — N186 End stage renal disease: Secondary | ICD-10-CM | POA: Diagnosis not present

## 2022-06-04 DIAGNOSIS — Z992 Dependence on renal dialysis: Secondary | ICD-10-CM | POA: Diagnosis not present

## 2022-06-07 DIAGNOSIS — N186 End stage renal disease: Secondary | ICD-10-CM | POA: Diagnosis not present

## 2022-06-07 DIAGNOSIS — N2581 Secondary hyperparathyroidism of renal origin: Secondary | ICD-10-CM | POA: Diagnosis not present

## 2022-06-07 DIAGNOSIS — Z992 Dependence on renal dialysis: Secondary | ICD-10-CM | POA: Diagnosis not present

## 2022-06-09 DIAGNOSIS — Z992 Dependence on renal dialysis: Secondary | ICD-10-CM | POA: Diagnosis not present

## 2022-06-09 DIAGNOSIS — N186 End stage renal disease: Secondary | ICD-10-CM | POA: Diagnosis not present

## 2022-06-09 DIAGNOSIS — N2581 Secondary hyperparathyroidism of renal origin: Secondary | ICD-10-CM | POA: Diagnosis not present

## 2022-06-11 DIAGNOSIS — N2581 Secondary hyperparathyroidism of renal origin: Secondary | ICD-10-CM | POA: Diagnosis not present

## 2022-06-11 DIAGNOSIS — Z992 Dependence on renal dialysis: Secondary | ICD-10-CM | POA: Diagnosis not present

## 2022-06-11 DIAGNOSIS — N186 End stage renal disease: Secondary | ICD-10-CM | POA: Diagnosis not present

## 2022-06-14 DIAGNOSIS — Z992 Dependence on renal dialysis: Secondary | ICD-10-CM | POA: Diagnosis not present

## 2022-06-14 DIAGNOSIS — N2581 Secondary hyperparathyroidism of renal origin: Secondary | ICD-10-CM | POA: Diagnosis not present

## 2022-06-14 DIAGNOSIS — N186 End stage renal disease: Secondary | ICD-10-CM | POA: Diagnosis not present

## 2022-06-16 DIAGNOSIS — N2581 Secondary hyperparathyroidism of renal origin: Secondary | ICD-10-CM | POA: Diagnosis not present

## 2022-06-16 DIAGNOSIS — N186 End stage renal disease: Secondary | ICD-10-CM | POA: Diagnosis not present

## 2022-06-16 DIAGNOSIS — Z992 Dependence on renal dialysis: Secondary | ICD-10-CM | POA: Diagnosis not present

## 2022-06-18 DIAGNOSIS — Z992 Dependence on renal dialysis: Secondary | ICD-10-CM | POA: Diagnosis not present

## 2022-06-18 DIAGNOSIS — N186 End stage renal disease: Secondary | ICD-10-CM | POA: Diagnosis not present

## 2022-06-18 DIAGNOSIS — N2581 Secondary hyperparathyroidism of renal origin: Secondary | ICD-10-CM | POA: Diagnosis not present

## 2022-06-21 DIAGNOSIS — N186 End stage renal disease: Secondary | ICD-10-CM | POA: Diagnosis not present

## 2022-06-21 DIAGNOSIS — Z992 Dependence on renal dialysis: Secondary | ICD-10-CM | POA: Diagnosis not present

## 2022-06-21 DIAGNOSIS — N2581 Secondary hyperparathyroidism of renal origin: Secondary | ICD-10-CM | POA: Diagnosis not present

## 2022-06-23 DIAGNOSIS — N2581 Secondary hyperparathyroidism of renal origin: Secondary | ICD-10-CM | POA: Diagnosis not present

## 2022-06-23 DIAGNOSIS — Z992 Dependence on renal dialysis: Secondary | ICD-10-CM | POA: Diagnosis not present

## 2022-06-23 DIAGNOSIS — N186 End stage renal disease: Secondary | ICD-10-CM | POA: Diagnosis not present

## 2022-06-25 DIAGNOSIS — Z992 Dependence on renal dialysis: Secondary | ICD-10-CM | POA: Diagnosis not present

## 2022-06-25 DIAGNOSIS — M6259 Muscle wasting and atrophy, not elsewhere classified, multiple sites: Secondary | ICD-10-CM | POA: Diagnosis not present

## 2022-06-25 DIAGNOSIS — N186 End stage renal disease: Secondary | ICD-10-CM | POA: Diagnosis not present

## 2022-06-25 DIAGNOSIS — N2581 Secondary hyperparathyroidism of renal origin: Secondary | ICD-10-CM | POA: Diagnosis not present

## 2022-06-27 DIAGNOSIS — M6259 Muscle wasting and atrophy, not elsewhere classified, multiple sites: Secondary | ICD-10-CM | POA: Diagnosis not present

## 2022-06-28 DIAGNOSIS — N186 End stage renal disease: Secondary | ICD-10-CM | POA: Diagnosis not present

## 2022-06-28 DIAGNOSIS — N2581 Secondary hyperparathyroidism of renal origin: Secondary | ICD-10-CM | POA: Diagnosis not present

## 2022-06-28 DIAGNOSIS — N170 Acute kidney failure with tubular necrosis: Secondary | ICD-10-CM | POA: Diagnosis not present

## 2022-06-28 DIAGNOSIS — Z992 Dependence on renal dialysis: Secondary | ICD-10-CM | POA: Diagnosis not present

## 2022-06-29 DIAGNOSIS — N186 End stage renal disease: Secondary | ICD-10-CM | POA: Diagnosis not present

## 2022-06-29 DIAGNOSIS — Z452 Encounter for adjustment and management of vascular access device: Secondary | ICD-10-CM | POA: Diagnosis not present

## 2022-06-29 DIAGNOSIS — Z992 Dependence on renal dialysis: Secondary | ICD-10-CM | POA: Diagnosis not present

## 2022-06-30 DIAGNOSIS — Z992 Dependence on renal dialysis: Secondary | ICD-10-CM | POA: Diagnosis not present

## 2022-06-30 DIAGNOSIS — N2581 Secondary hyperparathyroidism of renal origin: Secondary | ICD-10-CM | POA: Diagnosis not present

## 2022-06-30 DIAGNOSIS — N186 End stage renal disease: Secondary | ICD-10-CM | POA: Diagnosis not present

## 2022-07-02 DIAGNOSIS — N2581 Secondary hyperparathyroidism of renal origin: Secondary | ICD-10-CM | POA: Diagnosis not present

## 2022-07-02 DIAGNOSIS — Z992 Dependence on renal dialysis: Secondary | ICD-10-CM | POA: Diagnosis not present

## 2022-07-02 DIAGNOSIS — N186 End stage renal disease: Secondary | ICD-10-CM | POA: Diagnosis not present

## 2022-07-05 DIAGNOSIS — N186 End stage renal disease: Secondary | ICD-10-CM | POA: Diagnosis not present

## 2022-07-05 DIAGNOSIS — N2581 Secondary hyperparathyroidism of renal origin: Secondary | ICD-10-CM | POA: Diagnosis not present

## 2022-07-05 DIAGNOSIS — Z992 Dependence on renal dialysis: Secondary | ICD-10-CM | POA: Diagnosis not present

## 2022-07-07 DIAGNOSIS — N2581 Secondary hyperparathyroidism of renal origin: Secondary | ICD-10-CM | POA: Diagnosis not present

## 2022-07-07 DIAGNOSIS — Z992 Dependence on renal dialysis: Secondary | ICD-10-CM | POA: Diagnosis not present

## 2022-07-07 DIAGNOSIS — N186 End stage renal disease: Secondary | ICD-10-CM | POA: Diagnosis not present

## 2022-07-09 DIAGNOSIS — N2581 Secondary hyperparathyroidism of renal origin: Secondary | ICD-10-CM | POA: Diagnosis not present

## 2022-07-09 DIAGNOSIS — N186 End stage renal disease: Secondary | ICD-10-CM | POA: Diagnosis not present

## 2022-07-09 DIAGNOSIS — Z992 Dependence on renal dialysis: Secondary | ICD-10-CM | POA: Diagnosis not present

## 2022-07-12 DIAGNOSIS — N186 End stage renal disease: Secondary | ICD-10-CM | POA: Diagnosis not present

## 2022-07-12 DIAGNOSIS — Z992 Dependence on renal dialysis: Secondary | ICD-10-CM | POA: Diagnosis not present

## 2022-07-12 DIAGNOSIS — N2581 Secondary hyperparathyroidism of renal origin: Secondary | ICD-10-CM | POA: Diagnosis not present

## 2022-07-13 ENCOUNTER — Ambulatory Visit (INDEPENDENT_AMBULATORY_CARE_PROVIDER_SITE_OTHER): Payer: Medicare PPO | Admitting: Podiatry

## 2022-07-13 ENCOUNTER — Encounter: Payer: Self-pay | Admitting: Podiatry

## 2022-07-13 DIAGNOSIS — M79674 Pain in right toe(s): Secondary | ICD-10-CM | POA: Diagnosis not present

## 2022-07-13 DIAGNOSIS — M79675 Pain in left toe(s): Secondary | ICD-10-CM | POA: Diagnosis not present

## 2022-07-13 DIAGNOSIS — B351 Tinea unguium: Secondary | ICD-10-CM | POA: Diagnosis not present

## 2022-07-13 DIAGNOSIS — E1169 Type 2 diabetes mellitus with other specified complication: Secondary | ICD-10-CM | POA: Diagnosis not present

## 2022-07-13 NOTE — Progress Notes (Signed)
This patient presents  to my office for at risk foot care.  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  This patient is taking eliquis.This patient is unable to cut nails himself since the patient cannot reach his nails.These nails are painful walking and wearing shoes. He presents to the office with his wife. This patient presents for at risk foot care today.  General Appearance  Alert, conversant and in no acute stress.  Vascular  Dorsalis pedis and posterior tibial  pulses are  not palpable due to swelling  bilaterally.  Capillary return is within normal limits  bilaterally. Temperature is within normal limits  bilaterally.  Neurologic  Senn-Weinstein monofilament wire test diminished  bilaterally. Muscle power within normal limits bilaterally.  Nails Thick disfigured discolored nails with subungual debris  from hallux to fifth toes bilaterally. No evidence of bacterial infection or drainage bilaterally.  Orthopedic  No limitations of motion  feet .  No crepitus or effusions noted.  No bony pathology or digital deformities noted.  Skin  normotropic skin with no porokeratosis noted bilaterally.  No signs of infections or ulcers noted.     Onychomycosis  Pain in right toes  Pain in left toes  Consent was obtained for treatment procedures.   Mechanical debridement of nails 1-5  bilaterally performed with a nail nipper.  Filed with dremel without incident.    Return office visit   3  months                  Told patient to return for periodic foot care and evaluation due to potential at risk complications.   Helane Gunther DPM

## 2022-07-14 DIAGNOSIS — N2581 Secondary hyperparathyroidism of renal origin: Secondary | ICD-10-CM | POA: Diagnosis not present

## 2022-07-14 DIAGNOSIS — N186 End stage renal disease: Secondary | ICD-10-CM | POA: Diagnosis not present

## 2022-07-14 DIAGNOSIS — Z992 Dependence on renal dialysis: Secondary | ICD-10-CM | POA: Diagnosis not present

## 2022-07-16 DIAGNOSIS — Z992 Dependence on renal dialysis: Secondary | ICD-10-CM | POA: Diagnosis not present

## 2022-07-16 DIAGNOSIS — N2581 Secondary hyperparathyroidism of renal origin: Secondary | ICD-10-CM | POA: Diagnosis not present

## 2022-07-16 DIAGNOSIS — N186 End stage renal disease: Secondary | ICD-10-CM | POA: Diagnosis not present

## 2022-07-19 DIAGNOSIS — N2581 Secondary hyperparathyroidism of renal origin: Secondary | ICD-10-CM | POA: Diagnosis not present

## 2022-07-19 DIAGNOSIS — N186 End stage renal disease: Secondary | ICD-10-CM | POA: Diagnosis not present

## 2022-07-19 DIAGNOSIS — Z992 Dependence on renal dialysis: Secondary | ICD-10-CM | POA: Diagnosis not present

## 2022-07-21 DIAGNOSIS — N2581 Secondary hyperparathyroidism of renal origin: Secondary | ICD-10-CM | POA: Diagnosis not present

## 2022-07-21 DIAGNOSIS — Z992 Dependence on renal dialysis: Secondary | ICD-10-CM | POA: Diagnosis not present

## 2022-07-21 DIAGNOSIS — N186 End stage renal disease: Secondary | ICD-10-CM | POA: Diagnosis not present

## 2022-07-23 DIAGNOSIS — Z992 Dependence on renal dialysis: Secondary | ICD-10-CM | POA: Diagnosis not present

## 2022-07-23 DIAGNOSIS — N186 End stage renal disease: Secondary | ICD-10-CM | POA: Diagnosis not present

## 2022-07-23 DIAGNOSIS — N2581 Secondary hyperparathyroidism of renal origin: Secondary | ICD-10-CM | POA: Diagnosis not present

## 2022-07-26 DIAGNOSIS — N186 End stage renal disease: Secondary | ICD-10-CM | POA: Diagnosis not present

## 2022-07-26 DIAGNOSIS — N2581 Secondary hyperparathyroidism of renal origin: Secondary | ICD-10-CM | POA: Diagnosis not present

## 2022-07-26 DIAGNOSIS — Z992 Dependence on renal dialysis: Secondary | ICD-10-CM | POA: Diagnosis not present

## 2022-07-28 DIAGNOSIS — N2581 Secondary hyperparathyroidism of renal origin: Secondary | ICD-10-CM | POA: Diagnosis not present

## 2022-07-28 DIAGNOSIS — N186 End stage renal disease: Secondary | ICD-10-CM | POA: Diagnosis not present

## 2022-07-28 DIAGNOSIS — Z992 Dependence on renal dialysis: Secondary | ICD-10-CM | POA: Diagnosis not present

## 2022-07-29 DIAGNOSIS — N186 End stage renal disease: Secondary | ICD-10-CM | POA: Diagnosis not present

## 2022-07-29 DIAGNOSIS — N170 Acute kidney failure with tubular necrosis: Secondary | ICD-10-CM | POA: Diagnosis not present

## 2022-07-29 DIAGNOSIS — Z992 Dependence on renal dialysis: Secondary | ICD-10-CM | POA: Diagnosis not present

## 2022-07-30 DIAGNOSIS — N2581 Secondary hyperparathyroidism of renal origin: Secondary | ICD-10-CM | POA: Diagnosis not present

## 2022-07-30 DIAGNOSIS — N186 End stage renal disease: Secondary | ICD-10-CM | POA: Diagnosis not present

## 2022-07-30 DIAGNOSIS — Z992 Dependence on renal dialysis: Secondary | ICD-10-CM | POA: Diagnosis not present

## 2022-08-01 ENCOUNTER — Other Ambulatory Visit (HOSPITAL_COMMUNITY): Payer: Self-pay

## 2022-08-02 DIAGNOSIS — Z992 Dependence on renal dialysis: Secondary | ICD-10-CM | POA: Diagnosis not present

## 2022-08-02 DIAGNOSIS — N2581 Secondary hyperparathyroidism of renal origin: Secondary | ICD-10-CM | POA: Diagnosis not present

## 2022-08-02 DIAGNOSIS — N186 End stage renal disease: Secondary | ICD-10-CM | POA: Diagnosis not present

## 2022-08-04 DIAGNOSIS — Z992 Dependence on renal dialysis: Secondary | ICD-10-CM | POA: Diagnosis not present

## 2022-08-04 DIAGNOSIS — N186 End stage renal disease: Secondary | ICD-10-CM | POA: Diagnosis not present

## 2022-08-04 DIAGNOSIS — N2581 Secondary hyperparathyroidism of renal origin: Secondary | ICD-10-CM | POA: Diagnosis not present

## 2022-08-06 DIAGNOSIS — N2581 Secondary hyperparathyroidism of renal origin: Secondary | ICD-10-CM | POA: Diagnosis not present

## 2022-08-06 DIAGNOSIS — Z992 Dependence on renal dialysis: Secondary | ICD-10-CM | POA: Diagnosis not present

## 2022-08-06 DIAGNOSIS — N186 End stage renal disease: Secondary | ICD-10-CM | POA: Diagnosis not present

## 2022-08-09 DIAGNOSIS — N186 End stage renal disease: Secondary | ICD-10-CM | POA: Diagnosis not present

## 2022-08-09 DIAGNOSIS — N2581 Secondary hyperparathyroidism of renal origin: Secondary | ICD-10-CM | POA: Diagnosis not present

## 2022-08-09 DIAGNOSIS — Z992 Dependence on renal dialysis: Secondary | ICD-10-CM | POA: Diagnosis not present

## 2022-08-11 DIAGNOSIS — Z992 Dependence on renal dialysis: Secondary | ICD-10-CM | POA: Diagnosis not present

## 2022-08-11 DIAGNOSIS — N186 End stage renal disease: Secondary | ICD-10-CM | POA: Diagnosis not present

## 2022-08-11 DIAGNOSIS — N2581 Secondary hyperparathyroidism of renal origin: Secondary | ICD-10-CM | POA: Diagnosis not present

## 2022-08-13 DIAGNOSIS — N2581 Secondary hyperparathyroidism of renal origin: Secondary | ICD-10-CM | POA: Diagnosis not present

## 2022-08-13 DIAGNOSIS — N186 End stage renal disease: Secondary | ICD-10-CM | POA: Diagnosis not present

## 2022-08-13 DIAGNOSIS — Z992 Dependence on renal dialysis: Secondary | ICD-10-CM | POA: Diagnosis not present

## 2022-08-16 DIAGNOSIS — Z992 Dependence on renal dialysis: Secondary | ICD-10-CM | POA: Diagnosis not present

## 2022-08-16 DIAGNOSIS — N2581 Secondary hyperparathyroidism of renal origin: Secondary | ICD-10-CM | POA: Diagnosis not present

## 2022-08-16 DIAGNOSIS — N186 End stage renal disease: Secondary | ICD-10-CM | POA: Diagnosis not present

## 2022-08-18 DIAGNOSIS — Z992 Dependence on renal dialysis: Secondary | ICD-10-CM | POA: Diagnosis not present

## 2022-08-18 DIAGNOSIS — N2581 Secondary hyperparathyroidism of renal origin: Secondary | ICD-10-CM | POA: Diagnosis not present

## 2022-08-18 DIAGNOSIS — N186 End stage renal disease: Secondary | ICD-10-CM | POA: Diagnosis not present

## 2022-08-20 DIAGNOSIS — Z992 Dependence on renal dialysis: Secondary | ICD-10-CM | POA: Diagnosis not present

## 2022-08-20 DIAGNOSIS — N2581 Secondary hyperparathyroidism of renal origin: Secondary | ICD-10-CM | POA: Diagnosis not present

## 2022-08-20 DIAGNOSIS — N186 End stage renal disease: Secondary | ICD-10-CM | POA: Diagnosis not present

## 2022-08-22 DIAGNOSIS — N186 End stage renal disease: Secondary | ICD-10-CM | POA: Diagnosis not present

## 2022-08-22 DIAGNOSIS — Z992 Dependence on renal dialysis: Secondary | ICD-10-CM | POA: Diagnosis not present

## 2022-08-22 DIAGNOSIS — I871 Compression of vein: Secondary | ICD-10-CM | POA: Diagnosis not present

## 2022-08-22 DIAGNOSIS — T82858A Stenosis of vascular prosthetic devices, implants and grafts, initial encounter: Secondary | ICD-10-CM | POA: Diagnosis not present

## 2022-08-23 DIAGNOSIS — Z992 Dependence on renal dialysis: Secondary | ICD-10-CM | POA: Diagnosis not present

## 2022-08-23 DIAGNOSIS — N2581 Secondary hyperparathyroidism of renal origin: Secondary | ICD-10-CM | POA: Diagnosis not present

## 2022-08-23 DIAGNOSIS — N186 End stage renal disease: Secondary | ICD-10-CM | POA: Diagnosis not present

## 2022-08-25 DIAGNOSIS — N186 End stage renal disease: Secondary | ICD-10-CM | POA: Diagnosis not present

## 2022-08-25 DIAGNOSIS — Z992 Dependence on renal dialysis: Secondary | ICD-10-CM | POA: Diagnosis not present

## 2022-08-25 DIAGNOSIS — N2581 Secondary hyperparathyroidism of renal origin: Secondary | ICD-10-CM | POA: Diagnosis not present

## 2022-08-27 DIAGNOSIS — N2581 Secondary hyperparathyroidism of renal origin: Secondary | ICD-10-CM | POA: Diagnosis not present

## 2022-08-27 DIAGNOSIS — N186 End stage renal disease: Secondary | ICD-10-CM | POA: Diagnosis not present

## 2022-08-27 DIAGNOSIS — Z992 Dependence on renal dialysis: Secondary | ICD-10-CM | POA: Diagnosis not present

## 2022-08-28 DIAGNOSIS — N170 Acute kidney failure with tubular necrosis: Secondary | ICD-10-CM | POA: Diagnosis not present

## 2022-08-28 DIAGNOSIS — Z992 Dependence on renal dialysis: Secondary | ICD-10-CM | POA: Diagnosis not present

## 2022-08-28 DIAGNOSIS — N186 End stage renal disease: Secondary | ICD-10-CM | POA: Diagnosis not present

## 2022-08-30 DIAGNOSIS — N2581 Secondary hyperparathyroidism of renal origin: Secondary | ICD-10-CM | POA: Diagnosis not present

## 2022-08-30 DIAGNOSIS — N186 End stage renal disease: Secondary | ICD-10-CM | POA: Diagnosis not present

## 2022-08-30 DIAGNOSIS — Z992 Dependence on renal dialysis: Secondary | ICD-10-CM | POA: Diagnosis not present

## 2022-09-01 DIAGNOSIS — Z992 Dependence on renal dialysis: Secondary | ICD-10-CM | POA: Diagnosis not present

## 2022-09-01 DIAGNOSIS — N2581 Secondary hyperparathyroidism of renal origin: Secondary | ICD-10-CM | POA: Diagnosis not present

## 2022-09-01 DIAGNOSIS — N186 End stage renal disease: Secondary | ICD-10-CM | POA: Diagnosis not present

## 2022-09-03 DIAGNOSIS — Z992 Dependence on renal dialysis: Secondary | ICD-10-CM | POA: Diagnosis not present

## 2022-09-03 DIAGNOSIS — N2581 Secondary hyperparathyroidism of renal origin: Secondary | ICD-10-CM | POA: Diagnosis not present

## 2022-09-03 DIAGNOSIS — N186 End stage renal disease: Secondary | ICD-10-CM | POA: Diagnosis not present

## 2022-09-06 DIAGNOSIS — Z992 Dependence on renal dialysis: Secondary | ICD-10-CM | POA: Diagnosis not present

## 2022-09-06 DIAGNOSIS — N2581 Secondary hyperparathyroidism of renal origin: Secondary | ICD-10-CM | POA: Diagnosis not present

## 2022-09-06 DIAGNOSIS — N186 End stage renal disease: Secondary | ICD-10-CM | POA: Diagnosis not present

## 2022-09-08 DIAGNOSIS — Z992 Dependence on renal dialysis: Secondary | ICD-10-CM | POA: Diagnosis not present

## 2022-09-08 DIAGNOSIS — N2581 Secondary hyperparathyroidism of renal origin: Secondary | ICD-10-CM | POA: Diagnosis not present

## 2022-09-08 DIAGNOSIS — N186 End stage renal disease: Secondary | ICD-10-CM | POA: Diagnosis not present

## 2022-09-10 DIAGNOSIS — Z992 Dependence on renal dialysis: Secondary | ICD-10-CM | POA: Diagnosis not present

## 2022-09-10 DIAGNOSIS — N186 End stage renal disease: Secondary | ICD-10-CM | POA: Diagnosis not present

## 2022-09-10 DIAGNOSIS — N2581 Secondary hyperparathyroidism of renal origin: Secondary | ICD-10-CM | POA: Diagnosis not present

## 2022-09-12 DIAGNOSIS — H401123 Primary open-angle glaucoma, left eye, severe stage: Secondary | ICD-10-CM | POA: Diagnosis not present

## 2022-09-12 DIAGNOSIS — H4051X3 Glaucoma secondary to other eye disorders, right eye, severe stage: Secondary | ICD-10-CM | POA: Diagnosis not present

## 2022-09-13 DIAGNOSIS — Z992 Dependence on renal dialysis: Secondary | ICD-10-CM | POA: Diagnosis not present

## 2022-09-13 DIAGNOSIS — N186 End stage renal disease: Secondary | ICD-10-CM | POA: Diagnosis not present

## 2022-09-13 DIAGNOSIS — N2581 Secondary hyperparathyroidism of renal origin: Secondary | ICD-10-CM | POA: Diagnosis not present

## 2022-09-14 DIAGNOSIS — N186 End stage renal disease: Secondary | ICD-10-CM | POA: Diagnosis not present

## 2022-09-14 DIAGNOSIS — Z992 Dependence on renal dialysis: Secondary | ICD-10-CM | POA: Diagnosis not present

## 2022-09-14 DIAGNOSIS — I871 Compression of vein: Secondary | ICD-10-CM | POA: Diagnosis not present

## 2022-09-15 DIAGNOSIS — N2581 Secondary hyperparathyroidism of renal origin: Secondary | ICD-10-CM | POA: Diagnosis not present

## 2022-09-15 DIAGNOSIS — N186 End stage renal disease: Secondary | ICD-10-CM | POA: Diagnosis not present

## 2022-09-15 DIAGNOSIS — Z992 Dependence on renal dialysis: Secondary | ICD-10-CM | POA: Diagnosis not present

## 2022-09-17 DIAGNOSIS — Z992 Dependence on renal dialysis: Secondary | ICD-10-CM | POA: Diagnosis not present

## 2022-09-17 DIAGNOSIS — N186 End stage renal disease: Secondary | ICD-10-CM | POA: Diagnosis not present

## 2022-09-17 DIAGNOSIS — N2581 Secondary hyperparathyroidism of renal origin: Secondary | ICD-10-CM | POA: Diagnosis not present

## 2022-09-20 DIAGNOSIS — N2581 Secondary hyperparathyroidism of renal origin: Secondary | ICD-10-CM | POA: Diagnosis not present

## 2022-09-20 DIAGNOSIS — N186 End stage renal disease: Secondary | ICD-10-CM | POA: Diagnosis not present

## 2022-09-20 DIAGNOSIS — Z992 Dependence on renal dialysis: Secondary | ICD-10-CM | POA: Diagnosis not present

## 2022-09-22 DIAGNOSIS — Z992 Dependence on renal dialysis: Secondary | ICD-10-CM | POA: Diagnosis not present

## 2022-09-22 DIAGNOSIS — N2581 Secondary hyperparathyroidism of renal origin: Secondary | ICD-10-CM | POA: Diagnosis not present

## 2022-09-22 DIAGNOSIS — N186 End stage renal disease: Secondary | ICD-10-CM | POA: Diagnosis not present

## 2022-09-24 DIAGNOSIS — Z992 Dependence on renal dialysis: Secondary | ICD-10-CM | POA: Diagnosis not present

## 2022-09-24 DIAGNOSIS — N2581 Secondary hyperparathyroidism of renal origin: Secondary | ICD-10-CM | POA: Diagnosis not present

## 2022-09-24 DIAGNOSIS — N186 End stage renal disease: Secondary | ICD-10-CM | POA: Diagnosis not present

## 2022-09-27 DIAGNOSIS — N186 End stage renal disease: Secondary | ICD-10-CM | POA: Diagnosis not present

## 2022-09-27 DIAGNOSIS — Z992 Dependence on renal dialysis: Secondary | ICD-10-CM | POA: Diagnosis not present

## 2022-09-27 DIAGNOSIS — N2581 Secondary hyperparathyroidism of renal origin: Secondary | ICD-10-CM | POA: Diagnosis not present

## 2022-09-28 DIAGNOSIS — Z992 Dependence on renal dialysis: Secondary | ICD-10-CM | POA: Diagnosis not present

## 2022-09-28 DIAGNOSIS — N170 Acute kidney failure with tubular necrosis: Secondary | ICD-10-CM | POA: Diagnosis not present

## 2022-09-28 DIAGNOSIS — N186 End stage renal disease: Secondary | ICD-10-CM | POA: Diagnosis not present

## 2022-09-29 DIAGNOSIS — N186 End stage renal disease: Secondary | ICD-10-CM | POA: Diagnosis not present

## 2022-09-29 DIAGNOSIS — N2581 Secondary hyperparathyroidism of renal origin: Secondary | ICD-10-CM | POA: Diagnosis not present

## 2022-09-29 DIAGNOSIS — Z992 Dependence on renal dialysis: Secondary | ICD-10-CM | POA: Diagnosis not present

## 2022-10-01 DIAGNOSIS — N2581 Secondary hyperparathyroidism of renal origin: Secondary | ICD-10-CM | POA: Diagnosis not present

## 2022-10-01 DIAGNOSIS — Z992 Dependence on renal dialysis: Secondary | ICD-10-CM | POA: Diagnosis not present

## 2022-10-01 DIAGNOSIS — N186 End stage renal disease: Secondary | ICD-10-CM | POA: Diagnosis not present

## 2022-10-04 DIAGNOSIS — N2581 Secondary hyperparathyroidism of renal origin: Secondary | ICD-10-CM | POA: Diagnosis not present

## 2022-10-04 DIAGNOSIS — Z992 Dependence on renal dialysis: Secondary | ICD-10-CM | POA: Diagnosis not present

## 2022-10-04 DIAGNOSIS — N186 End stage renal disease: Secondary | ICD-10-CM | POA: Diagnosis not present

## 2022-10-06 DIAGNOSIS — N186 End stage renal disease: Secondary | ICD-10-CM | POA: Diagnosis not present

## 2022-10-06 DIAGNOSIS — N2581 Secondary hyperparathyroidism of renal origin: Secondary | ICD-10-CM | POA: Diagnosis not present

## 2022-10-06 DIAGNOSIS — Z992 Dependence on renal dialysis: Secondary | ICD-10-CM | POA: Diagnosis not present

## 2022-10-08 DIAGNOSIS — Z992 Dependence on renal dialysis: Secondary | ICD-10-CM | POA: Diagnosis not present

## 2022-10-08 DIAGNOSIS — N2581 Secondary hyperparathyroidism of renal origin: Secondary | ICD-10-CM | POA: Diagnosis not present

## 2022-10-08 DIAGNOSIS — N186 End stage renal disease: Secondary | ICD-10-CM | POA: Diagnosis not present

## 2022-10-11 DIAGNOSIS — N186 End stage renal disease: Secondary | ICD-10-CM | POA: Diagnosis not present

## 2022-10-11 DIAGNOSIS — Z992 Dependence on renal dialysis: Secondary | ICD-10-CM | POA: Diagnosis not present

## 2022-10-11 DIAGNOSIS — N2581 Secondary hyperparathyroidism of renal origin: Secondary | ICD-10-CM | POA: Diagnosis not present

## 2022-10-13 DIAGNOSIS — Z992 Dependence on renal dialysis: Secondary | ICD-10-CM | POA: Diagnosis not present

## 2022-10-13 DIAGNOSIS — N186 End stage renal disease: Secondary | ICD-10-CM | POA: Diagnosis not present

## 2022-10-13 DIAGNOSIS — N2581 Secondary hyperparathyroidism of renal origin: Secondary | ICD-10-CM | POA: Diagnosis not present

## 2022-10-15 DIAGNOSIS — N186 End stage renal disease: Secondary | ICD-10-CM | POA: Diagnosis not present

## 2022-10-15 DIAGNOSIS — Z992 Dependence on renal dialysis: Secondary | ICD-10-CM | POA: Diagnosis not present

## 2022-10-15 DIAGNOSIS — N2581 Secondary hyperparathyroidism of renal origin: Secondary | ICD-10-CM | POA: Diagnosis not present

## 2022-10-17 ENCOUNTER — Ambulatory Visit: Payer: Medicare PPO | Admitting: Podiatry

## 2022-10-18 DIAGNOSIS — N186 End stage renal disease: Secondary | ICD-10-CM | POA: Diagnosis not present

## 2022-10-18 DIAGNOSIS — Z992 Dependence on renal dialysis: Secondary | ICD-10-CM | POA: Diagnosis not present

## 2022-10-18 DIAGNOSIS — N2581 Secondary hyperparathyroidism of renal origin: Secondary | ICD-10-CM | POA: Diagnosis not present

## 2022-10-20 DIAGNOSIS — N2581 Secondary hyperparathyroidism of renal origin: Secondary | ICD-10-CM | POA: Diagnosis not present

## 2022-10-20 DIAGNOSIS — Z992 Dependence on renal dialysis: Secondary | ICD-10-CM | POA: Diagnosis not present

## 2022-10-20 DIAGNOSIS — N186 End stage renal disease: Secondary | ICD-10-CM | POA: Diagnosis not present

## 2022-10-22 DIAGNOSIS — Z992 Dependence on renal dialysis: Secondary | ICD-10-CM | POA: Diagnosis not present

## 2022-10-22 DIAGNOSIS — N186 End stage renal disease: Secondary | ICD-10-CM | POA: Diagnosis not present

## 2022-10-22 DIAGNOSIS — N2581 Secondary hyperparathyroidism of renal origin: Secondary | ICD-10-CM | POA: Diagnosis not present

## 2022-10-25 DIAGNOSIS — N2581 Secondary hyperparathyroidism of renal origin: Secondary | ICD-10-CM | POA: Diagnosis not present

## 2022-10-25 DIAGNOSIS — Z992 Dependence on renal dialysis: Secondary | ICD-10-CM | POA: Diagnosis not present

## 2022-10-25 DIAGNOSIS — N186 End stage renal disease: Secondary | ICD-10-CM | POA: Diagnosis not present

## 2022-10-27 DIAGNOSIS — Z992 Dependence on renal dialysis: Secondary | ICD-10-CM | POA: Diagnosis not present

## 2022-10-27 DIAGNOSIS — N2581 Secondary hyperparathyroidism of renal origin: Secondary | ICD-10-CM | POA: Diagnosis not present

## 2022-10-27 DIAGNOSIS — N186 End stage renal disease: Secondary | ICD-10-CM | POA: Diagnosis not present

## 2022-10-29 DIAGNOSIS — Z992 Dependence on renal dialysis: Secondary | ICD-10-CM | POA: Diagnosis not present

## 2022-10-29 DIAGNOSIS — N186 End stage renal disease: Secondary | ICD-10-CM | POA: Diagnosis not present

## 2022-10-29 DIAGNOSIS — N170 Acute kidney failure with tubular necrosis: Secondary | ICD-10-CM | POA: Diagnosis not present

## 2022-10-29 DIAGNOSIS — N2581 Secondary hyperparathyroidism of renal origin: Secondary | ICD-10-CM | POA: Diagnosis not present

## 2022-11-01 DIAGNOSIS — N186 End stage renal disease: Secondary | ICD-10-CM | POA: Diagnosis not present

## 2022-11-01 DIAGNOSIS — Z992 Dependence on renal dialysis: Secondary | ICD-10-CM | POA: Diagnosis not present

## 2022-11-01 DIAGNOSIS — N2581 Secondary hyperparathyroidism of renal origin: Secondary | ICD-10-CM | POA: Diagnosis not present

## 2022-11-02 ENCOUNTER — Encounter: Payer: Self-pay | Admitting: Podiatry

## 2022-11-02 ENCOUNTER — Ambulatory Visit (INDEPENDENT_AMBULATORY_CARE_PROVIDER_SITE_OTHER): Payer: Medicare PPO | Admitting: Podiatry

## 2022-11-02 DIAGNOSIS — B351 Tinea unguium: Secondary | ICD-10-CM | POA: Diagnosis not present

## 2022-11-02 DIAGNOSIS — M79674 Pain in right toe(s): Secondary | ICD-10-CM

## 2022-11-02 DIAGNOSIS — M79675 Pain in left toe(s): Secondary | ICD-10-CM

## 2022-11-02 DIAGNOSIS — E1169 Type 2 diabetes mellitus with other specified complication: Secondary | ICD-10-CM

## 2022-11-02 NOTE — Progress Notes (Signed)
This patient presents  to my office for at risk foot care.  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  This patient is taking eliquis.This patient is unable to cut nails himself since the patient cannot reach his nails.These nails are painful walking and wearing shoes. He presents to the office with his wife. This patient presents for at risk foot care today.  General Appearance  Alert, conversant and in no acute stress.  Vascular  Dorsalis pedis and posterior tibial  pulses are  not palpable due to swelling  bilaterally.  Capillary return is within normal limits  bilaterally. Temperature is within normal limits  bilaterally.  Neurologic  Senn-Weinstein monofilament wire test diminished  bilaterally. Muscle power within normal limits bilaterally.  Nails Thick disfigured discolored nails with subungual debris  from hallux to fifth toes bilaterally. No evidence of bacterial infection or drainage bilaterally.  Orthopedic  No limitations of motion  feet .  No crepitus or effusions noted.  No bony pathology or digital deformities noted.  Skin  normotropic skin with no porokeratosis noted bilaterally.  No signs of infections or ulcers noted.     Onychomycosis  Pain in right toes  Pain in left toes  Consent was obtained for treatment procedures.   Mechanical debridement of nails 1-5  bilaterally performed with a nail nipper.  Filed with dremel without incident.    Return office visit   3  months                  Told patient to return for periodic foot care and evaluation due to potential at risk complications.   Helane Gunther DPM

## 2022-11-03 DIAGNOSIS — N2581 Secondary hyperparathyroidism of renal origin: Secondary | ICD-10-CM | POA: Diagnosis not present

## 2022-11-03 DIAGNOSIS — N186 End stage renal disease: Secondary | ICD-10-CM | POA: Diagnosis not present

## 2022-11-03 DIAGNOSIS — Z992 Dependence on renal dialysis: Secondary | ICD-10-CM | POA: Diagnosis not present

## 2022-11-04 DIAGNOSIS — Z0000 Encounter for general adult medical examination without abnormal findings: Secondary | ICD-10-CM | POA: Diagnosis not present

## 2022-11-04 DIAGNOSIS — Z1331 Encounter for screening for depression: Secondary | ICD-10-CM | POA: Diagnosis not present

## 2022-11-05 DIAGNOSIS — N186 End stage renal disease: Secondary | ICD-10-CM | POA: Diagnosis not present

## 2022-11-05 DIAGNOSIS — N2581 Secondary hyperparathyroidism of renal origin: Secondary | ICD-10-CM | POA: Diagnosis not present

## 2022-11-05 DIAGNOSIS — Z992 Dependence on renal dialysis: Secondary | ICD-10-CM | POA: Diagnosis not present

## 2022-11-08 DIAGNOSIS — N186 End stage renal disease: Secondary | ICD-10-CM | POA: Diagnosis not present

## 2022-11-08 DIAGNOSIS — N2581 Secondary hyperparathyroidism of renal origin: Secondary | ICD-10-CM | POA: Diagnosis not present

## 2022-11-08 DIAGNOSIS — Z992 Dependence on renal dialysis: Secondary | ICD-10-CM | POA: Diagnosis not present

## 2022-11-09 DIAGNOSIS — E1165 Type 2 diabetes mellitus with hyperglycemia: Secondary | ICD-10-CM | POA: Diagnosis not present

## 2022-11-09 DIAGNOSIS — Z23 Encounter for immunization: Secondary | ICD-10-CM | POA: Diagnosis not present

## 2022-11-09 DIAGNOSIS — I480 Paroxysmal atrial fibrillation: Secondary | ICD-10-CM | POA: Diagnosis not present

## 2022-11-09 DIAGNOSIS — E785 Hyperlipidemia, unspecified: Secondary | ICD-10-CM | POA: Diagnosis not present

## 2022-11-09 DIAGNOSIS — Z0000 Encounter for general adult medical examination without abnormal findings: Secondary | ICD-10-CM | POA: Diagnosis not present

## 2022-11-09 DIAGNOSIS — N186 End stage renal disease: Secondary | ICD-10-CM | POA: Diagnosis not present

## 2022-11-09 DIAGNOSIS — H543 Unqualified visual loss, both eyes: Secondary | ICD-10-CM | POA: Diagnosis not present

## 2022-11-09 DIAGNOSIS — C61 Malignant neoplasm of prostate: Secondary | ICD-10-CM | POA: Diagnosis not present

## 2022-11-09 DIAGNOSIS — I10 Essential (primary) hypertension: Secondary | ICD-10-CM | POA: Diagnosis not present

## 2022-11-10 DIAGNOSIS — Z992 Dependence on renal dialysis: Secondary | ICD-10-CM | POA: Diagnosis not present

## 2022-11-10 DIAGNOSIS — N186 End stage renal disease: Secondary | ICD-10-CM | POA: Diagnosis not present

## 2022-11-10 DIAGNOSIS — N2581 Secondary hyperparathyroidism of renal origin: Secondary | ICD-10-CM | POA: Diagnosis not present

## 2022-11-12 DIAGNOSIS — N2581 Secondary hyperparathyroidism of renal origin: Secondary | ICD-10-CM | POA: Diagnosis not present

## 2022-11-12 DIAGNOSIS — Z992 Dependence on renal dialysis: Secondary | ICD-10-CM | POA: Diagnosis not present

## 2022-11-12 DIAGNOSIS — N186 End stage renal disease: Secondary | ICD-10-CM | POA: Diagnosis not present

## 2022-11-15 DIAGNOSIS — N186 End stage renal disease: Secondary | ICD-10-CM | POA: Diagnosis not present

## 2022-11-15 DIAGNOSIS — N2581 Secondary hyperparathyroidism of renal origin: Secondary | ICD-10-CM | POA: Diagnosis not present

## 2022-11-15 DIAGNOSIS — Z992 Dependence on renal dialysis: Secondary | ICD-10-CM | POA: Diagnosis not present

## 2022-11-16 ENCOUNTER — Ambulatory Visit: Payer: Medicare PPO | Admitting: Urology

## 2022-11-17 DIAGNOSIS — N2581 Secondary hyperparathyroidism of renal origin: Secondary | ICD-10-CM | POA: Diagnosis not present

## 2022-11-17 DIAGNOSIS — Z992 Dependence on renal dialysis: Secondary | ICD-10-CM | POA: Diagnosis not present

## 2022-11-17 DIAGNOSIS — N186 End stage renal disease: Secondary | ICD-10-CM | POA: Diagnosis not present

## 2022-11-19 DIAGNOSIS — N186 End stage renal disease: Secondary | ICD-10-CM | POA: Diagnosis not present

## 2022-11-19 DIAGNOSIS — Z992 Dependence on renal dialysis: Secondary | ICD-10-CM | POA: Diagnosis not present

## 2022-11-19 DIAGNOSIS — N2581 Secondary hyperparathyroidism of renal origin: Secondary | ICD-10-CM | POA: Diagnosis not present

## 2022-11-22 DIAGNOSIS — N2581 Secondary hyperparathyroidism of renal origin: Secondary | ICD-10-CM | POA: Diagnosis not present

## 2022-11-22 DIAGNOSIS — Z992 Dependence on renal dialysis: Secondary | ICD-10-CM | POA: Diagnosis not present

## 2022-11-22 DIAGNOSIS — N186 End stage renal disease: Secondary | ICD-10-CM | POA: Diagnosis not present

## 2022-11-24 DIAGNOSIS — N186 End stage renal disease: Secondary | ICD-10-CM | POA: Diagnosis not present

## 2022-11-24 DIAGNOSIS — Z992 Dependence on renal dialysis: Secondary | ICD-10-CM | POA: Diagnosis not present

## 2022-11-24 DIAGNOSIS — N2581 Secondary hyperparathyroidism of renal origin: Secondary | ICD-10-CM | POA: Diagnosis not present

## 2022-11-26 DIAGNOSIS — N186 End stage renal disease: Secondary | ICD-10-CM | POA: Diagnosis not present

## 2022-11-26 DIAGNOSIS — Z992 Dependence on renal dialysis: Secondary | ICD-10-CM | POA: Diagnosis not present

## 2022-11-26 DIAGNOSIS — N2581 Secondary hyperparathyroidism of renal origin: Secondary | ICD-10-CM | POA: Diagnosis not present

## 2022-11-28 DIAGNOSIS — Z992 Dependence on renal dialysis: Secondary | ICD-10-CM | POA: Diagnosis not present

## 2022-11-28 DIAGNOSIS — N186 End stage renal disease: Secondary | ICD-10-CM | POA: Diagnosis not present

## 2022-11-28 DIAGNOSIS — N170 Acute kidney failure with tubular necrosis: Secondary | ICD-10-CM | POA: Diagnosis not present

## 2022-11-29 DIAGNOSIS — Z992 Dependence on renal dialysis: Secondary | ICD-10-CM | POA: Diagnosis not present

## 2022-11-29 DIAGNOSIS — N186 End stage renal disease: Secondary | ICD-10-CM | POA: Diagnosis not present

## 2022-11-29 DIAGNOSIS — N2581 Secondary hyperparathyroidism of renal origin: Secondary | ICD-10-CM | POA: Diagnosis not present

## 2022-12-01 DIAGNOSIS — N2581 Secondary hyperparathyroidism of renal origin: Secondary | ICD-10-CM | POA: Diagnosis not present

## 2022-12-01 DIAGNOSIS — N186 End stage renal disease: Secondary | ICD-10-CM | POA: Diagnosis not present

## 2022-12-01 DIAGNOSIS — Z992 Dependence on renal dialysis: Secondary | ICD-10-CM | POA: Diagnosis not present

## 2022-12-03 DIAGNOSIS — N2581 Secondary hyperparathyroidism of renal origin: Secondary | ICD-10-CM | POA: Diagnosis not present

## 2022-12-03 DIAGNOSIS — N186 End stage renal disease: Secondary | ICD-10-CM | POA: Diagnosis not present

## 2022-12-03 DIAGNOSIS — Z992 Dependence on renal dialysis: Secondary | ICD-10-CM | POA: Diagnosis not present

## 2022-12-06 DIAGNOSIS — N186 End stage renal disease: Secondary | ICD-10-CM | POA: Diagnosis not present

## 2022-12-06 DIAGNOSIS — N2581 Secondary hyperparathyroidism of renal origin: Secondary | ICD-10-CM | POA: Diagnosis not present

## 2022-12-06 DIAGNOSIS — Z992 Dependence on renal dialysis: Secondary | ICD-10-CM | POA: Diagnosis not present

## 2022-12-08 DIAGNOSIS — Z992 Dependence on renal dialysis: Secondary | ICD-10-CM | POA: Diagnosis not present

## 2022-12-08 DIAGNOSIS — N186 End stage renal disease: Secondary | ICD-10-CM | POA: Diagnosis not present

## 2022-12-08 DIAGNOSIS — N2581 Secondary hyperparathyroidism of renal origin: Secondary | ICD-10-CM | POA: Diagnosis not present

## 2022-12-10 DIAGNOSIS — Z992 Dependence on renal dialysis: Secondary | ICD-10-CM | POA: Diagnosis not present

## 2022-12-10 DIAGNOSIS — N186 End stage renal disease: Secondary | ICD-10-CM | POA: Diagnosis not present

## 2022-12-10 DIAGNOSIS — N2581 Secondary hyperparathyroidism of renal origin: Secondary | ICD-10-CM | POA: Diagnosis not present

## 2022-12-13 DIAGNOSIS — N186 End stage renal disease: Secondary | ICD-10-CM | POA: Diagnosis not present

## 2022-12-13 DIAGNOSIS — N2581 Secondary hyperparathyroidism of renal origin: Secondary | ICD-10-CM | POA: Diagnosis not present

## 2022-12-13 DIAGNOSIS — Z992 Dependence on renal dialysis: Secondary | ICD-10-CM | POA: Diagnosis not present

## 2022-12-15 DIAGNOSIS — N186 End stage renal disease: Secondary | ICD-10-CM | POA: Diagnosis not present

## 2022-12-15 DIAGNOSIS — N2581 Secondary hyperparathyroidism of renal origin: Secondary | ICD-10-CM | POA: Diagnosis not present

## 2022-12-15 DIAGNOSIS — Z992 Dependence on renal dialysis: Secondary | ICD-10-CM | POA: Diagnosis not present

## 2022-12-17 DIAGNOSIS — Z992 Dependence on renal dialysis: Secondary | ICD-10-CM | POA: Diagnosis not present

## 2022-12-17 DIAGNOSIS — N186 End stage renal disease: Secondary | ICD-10-CM | POA: Diagnosis not present

## 2022-12-17 DIAGNOSIS — N2581 Secondary hyperparathyroidism of renal origin: Secondary | ICD-10-CM | POA: Diagnosis not present

## 2022-12-20 DIAGNOSIS — Z992 Dependence on renal dialysis: Secondary | ICD-10-CM | POA: Diagnosis not present

## 2022-12-20 DIAGNOSIS — N2581 Secondary hyperparathyroidism of renal origin: Secondary | ICD-10-CM | POA: Diagnosis not present

## 2022-12-20 DIAGNOSIS — N186 End stage renal disease: Secondary | ICD-10-CM | POA: Diagnosis not present

## 2022-12-21 ENCOUNTER — Ambulatory Visit: Payer: Medicare PPO | Admitting: Urology

## 2022-12-21 ENCOUNTER — Encounter: Payer: Self-pay | Admitting: Urology

## 2022-12-21 VITALS — BP 93/68 | HR 81 | Ht 77.0 in | Wt 213.0 lb

## 2022-12-21 DIAGNOSIS — C61 Malignant neoplasm of prostate: Secondary | ICD-10-CM | POA: Diagnosis not present

## 2022-12-21 DIAGNOSIS — N401 Enlarged prostate with lower urinary tract symptoms: Secondary | ICD-10-CM

## 2022-12-21 MED ORDER — DUTASTERIDE 0.5 MG PO CAPS: 0.5000 mg | ORAL_CAPSULE | ORAL | 11 refills | Status: DC

## 2022-12-21 NOTE — Progress Notes (Signed)
I, Austin Wheeler, acting as a scribe for Austin Altes, MD., have documented all relevant documentation on the behalf of Austin Altes, MD, as directed by Austin Altes, MD while in the presence of Austin Altes, MD.  12/21/2022 3:52 PM   Clemon Chambers Wheeler May 05, 1940 [id number]  Referring provider: Marden Noble, MD [address] [place],  [place] [address]  Chief Complaint  Patient presents with   Elevated PSA   Urologic history: 1. Prostate cancer, moderate risk on active surveillance Prostate biopsy was performed at [facility] in [place] in April 2006 for a PSA of 6.5.        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                         Prostate volume 150 cc; could not decide on treatment options and elected active surveillance.                     Repeat prostate biopsy November 2010 showed a volume of 198 cc and no cancer.    prostate MRI performed September 2017 showed a 1.4 cm PIRADS 4 lesion in the transition zone in the right mid gland.  No adenopathy or evidence of extracapsular extension was identified.        elected to continue active surveillance.   2. BPH with lower urinary tract symptoms doxazosin and dutasteride  HPI: Austin Wheeler is a 82 y.o. male presents for prostate cancer.   Remains on dialysis and states he is oliguric. Remains on doxazosin for hypertension and has continued dutasteride.  Last PSA September 2023 was 1.71 (uncorrected)   PMH: Past Medical History:  Diagnosis Date   Anemia    Atrial fibrillation (HCC)    Blind    walker and wheelchair   BPH (benign prostatic hyperplasia)    Chronic kidney disease    Complication of anesthesia    Retention after eye surgery.  Patient's wife denies this dx as of 12/10/21.   Diabetes mellitus without complication (HCC)    Type II, does not check levels at home.   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 - followed by Dr Austin Wheeler   Prostate cancer [facility])    Renal failure    dialysis T-TH-S   Austin Wheeler as ambulation aid    also uses wheelchair    Surgical History: Past Surgical History:  Procedure Laterality Date   AV FISTULA PLACEMENT Right 05/13/2021   Procedure: CREATION OF RIGHT ARM BRACHIOCEPHALIC FISTULA;  Surgeon: Austin Harman, MD;  Location: [facility] OR;  Service: Vascular;  Laterality: Right;   AV FISTULA PLACEMENT Right 10/15/2021   Procedure: RIGHT ARTERIOVENOUS (AV) FISTULA CREATION VERSUS GRAFT;  Surgeon: Austin Shelling, MD;  Location: [facility] OR;  Service: Vascular;  Laterality: Right;   BASCILIC VEIN TRANSPOSITION Right 12/13/2021   Procedure: RIGHT SECOND STAGE BASILIC VEIN TRANSPOSITION;  Surgeon: Austin Shelling, MD;  Location: [facility] OR;  Service: Vascular;  Laterality: Right;   EYE SURGERY     Bilateral glaucoma and macular degeneration   FISTULA SUPERFICIALIZATION Right 07/16/2021   Procedure: RIGHT UPPER EXTREMITY FISTULA SUPERFICIALIZATION WITH BRANCH LIGATION;  Surgeon: Austin Harman, MD;  Location: [facility] OR;  Service: Vascular;  Laterality: Right;  Block  IR FLUORO GUIDE CV LINE RIGHT  05/03/2021   IR PARACENTESIS  04/23/2021   IR US GUIDE VASC ACCESS RIGHT  05/03/2021    Home Medications:  Allergies as of 12/21/2022       Reactions   Guaifenesin Swelling, Rash   Hand and feet swelling from Robitussin 50 years ago   Robitussin Dm Max Day-night Other (See Comments)   Other reaction(s): feet and leg swelling 50 years ago   Lexapro [escitalopram] Nausea And Vomiting   Reported by [facility] - pt does not recall        Medication List        Accurate as of December 21, 2022  3:52 PM. If you have any questions, ask your nurse or doctor.          STOP taking these medications    pilocarpine 4 % ophthalmic solution Commonly known as: PILOCAR Stopped by:  Austin Wheeler       TAKE these medications    acetaminophen 500 MG tablet Commonly known as: TYLENOL Take 500 mg by mouth 2 (two) times daily.   apixaban 2.5 MG Tabs tablet Commonly known as: ELIQUIS Take 1 tablet (2.5 mg total) by mouth 2 (two) times daily.   aspirin EC 81 MG tablet Take 81 mg by mouth daily.   atropine 1 % ophthalmic solution Place 1 drop into the right eye at bedtime.   brimonidine 0.2 % ophthalmic solution Commonly known as: ALPHAGAN Place 1 drop into the left eye 3 (three) times daily.   dorzolamide-timolol 2-0.5 % ophthalmic solution Commonly known as: COSOPT Place 1 drop into the left eye 2 (two) times daily.   doxazosin 8 MG tablet Commonly known as: CARDURA Take 1 tablet (8 mg total) by mouth daily. What changed:  when to take this additional instructions   dutasteride 0.5 MG capsule Commonly known as: AVODART Take 1 capsule (0.5 mg total) by mouth every Monday, Wednesday, and Friday.   feeding supplement (NEPRO CARB STEADY) Liqd Take 237 mLs by mouth daily as needed (Supplement).   latanoprost 0.005 % ophthalmic solution Commonly known as: XALATAN Place 1 drop into the left eye at bedtime.   multivitamin Tabs tablet Take 1 tablet by mouth daily.   olmesartan-hydrochlorothiazide 40-12.5 MG tablet Commonly known as: BENICAR HCT Take 1 tablet by mouth at bedtime. Take if blood pressure is above 120 take with Doxazosin 8 mg   prednisoLONE acetate 1 % ophthalmic suspension Commonly known as: PRED FORTE Place 1 drop into the right eye at bedtime.   triamcinolone ointment 0.1 % Commonly known as: KENALOG Apply 1 Application topically 2 (two) times daily as needed (wound care).        Allergies:  Allergies  Allergen Reactions   Guaifenesin Swelling and Rash    Hand and feet swelling from Robitussin 50 years ago   Robitussin Dm Max Day-Night Other (See Comments)    Other reaction(s): feet and leg swelling 50 years ago    Lexapro [Escitalopram] Nausea And Vomiting    Reported by [facility] - pt does not recall    Social History:  reports that he quit smoking about 5 years ago. His smoking use included cigarettes. He has never been exposed to tobacco smoke. He has never used smokeless tobacco. He reports that he does not currently use alcohol. He reports that he does not use drugs.   Physical Exam: BP 93/68   Pulse 81   Ht 6\' 5"  (1.956 m)  Wt 213 lb (96.6 kg)   BMI 25.26 kg/m   Constitutional:  Alert, No acute distress. HEENT: [place] AT Respiratory: Normal respiratory effort, no increased work of breathing. Psychiatric: Normal mood and affect.   Assessment & Plan:    1.  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 Continue annual follow-up   2.  BPH with LUTS Oliguric on hemodialysis, Remains on dutasteride secondary to prostate size   I have reviewed the above documentation for accuracy and completeness, and I agree with the above.   Austin Altes, MD  [facility] [address] [place], [place] [address] [phone]

## 2022-12-22 DIAGNOSIS — N186 End stage renal disease: Secondary | ICD-10-CM | POA: Diagnosis not present

## 2022-12-22 DIAGNOSIS — N2581 Secondary hyperparathyroidism of renal origin: Secondary | ICD-10-CM | POA: Diagnosis not present

## 2022-12-22 DIAGNOSIS — Z992 Dependence on renal dialysis: Secondary | ICD-10-CM | POA: Diagnosis not present

## 2022-12-24 DIAGNOSIS — N2581 Secondary hyperparathyroidism of renal origin: Secondary | ICD-10-CM | POA: Diagnosis not present

## 2022-12-24 DIAGNOSIS — N186 End stage renal disease: Secondary | ICD-10-CM | POA: Diagnosis not present

## 2022-12-24 DIAGNOSIS — Z992 Dependence on renal dialysis: Secondary | ICD-10-CM | POA: Diagnosis not present

## 2022-12-26 DIAGNOSIS — E1122 Type 2 diabetes mellitus with diabetic chronic kidney disease: Secondary | ICD-10-CM | POA: Diagnosis not present

## 2022-12-26 DIAGNOSIS — N186 End stage renal disease: Secondary | ICD-10-CM | POA: Diagnosis not present

## 2022-12-26 DIAGNOSIS — I132 Hypertensive heart and chronic kidney disease with heart failure and with stage 5 chronic kidney disease, or end stage renal disease: Secondary | ICD-10-CM | POA: Diagnosis not present

## 2022-12-26 DIAGNOSIS — E559 Vitamin D deficiency, unspecified: Secondary | ICD-10-CM | POA: Diagnosis not present

## 2022-12-26 DIAGNOSIS — E1165 Type 2 diabetes mellitus with hyperglycemia: Secondary | ICD-10-CM | POA: Diagnosis not present

## 2022-12-26 DIAGNOSIS — I480 Paroxysmal atrial fibrillation: Secondary | ICD-10-CM | POA: Diagnosis not present

## 2022-12-26 DIAGNOSIS — I5082 Biventricular heart failure: Secondary | ICD-10-CM | POA: Diagnosis not present

## 2022-12-26 DIAGNOSIS — I77810 Thoracic aortic ectasia: Secondary | ICD-10-CM | POA: Diagnosis not present

## 2022-12-26 DIAGNOSIS — E785 Hyperlipidemia, unspecified: Secondary | ICD-10-CM | POA: Diagnosis not present

## 2022-12-27 DIAGNOSIS — N186 End stage renal disease: Secondary | ICD-10-CM | POA: Diagnosis not present

## 2022-12-27 DIAGNOSIS — N2581 Secondary hyperparathyroidism of renal origin: Secondary | ICD-10-CM | POA: Diagnosis not present

## 2022-12-27 DIAGNOSIS — Z992 Dependence on renal dialysis: Secondary | ICD-10-CM | POA: Diagnosis not present

## 2022-12-29 DIAGNOSIS — N2581 Secondary hyperparathyroidism of renal origin: Secondary | ICD-10-CM | POA: Diagnosis not present

## 2022-12-29 DIAGNOSIS — N186 End stage renal disease: Secondary | ICD-10-CM | POA: Diagnosis not present

## 2022-12-29 DIAGNOSIS — Z992 Dependence on renal dialysis: Secondary | ICD-10-CM | POA: Diagnosis not present

## 2022-12-29 DIAGNOSIS — N170 Acute kidney failure with tubular necrosis: Secondary | ICD-10-CM | POA: Diagnosis not present

## 2022-12-30 DIAGNOSIS — E559 Vitamin D deficiency, unspecified: Secondary | ICD-10-CM | POA: Diagnosis not present

## 2022-12-30 DIAGNOSIS — E1122 Type 2 diabetes mellitus with diabetic chronic kidney disease: Secondary | ICD-10-CM | POA: Diagnosis not present

## 2022-12-30 DIAGNOSIS — N186 End stage renal disease: Secondary | ICD-10-CM | POA: Diagnosis not present

## 2022-12-30 DIAGNOSIS — I480 Paroxysmal atrial fibrillation: Secondary | ICD-10-CM | POA: Diagnosis not present

## 2022-12-30 DIAGNOSIS — I5082 Biventricular heart failure: Secondary | ICD-10-CM | POA: Diagnosis not present

## 2022-12-30 DIAGNOSIS — E785 Hyperlipidemia, unspecified: Secondary | ICD-10-CM | POA: Diagnosis not present

## 2022-12-30 DIAGNOSIS — I77810 Thoracic aortic ectasia: Secondary | ICD-10-CM | POA: Diagnosis not present

## 2022-12-30 DIAGNOSIS — I132 Hypertensive heart and chronic kidney disease with heart failure and with stage 5 chronic kidney disease, or end stage renal disease: Secondary | ICD-10-CM | POA: Diagnosis not present

## 2022-12-30 DIAGNOSIS — E1165 Type 2 diabetes mellitus with hyperglycemia: Secondary | ICD-10-CM | POA: Diagnosis not present

## 2022-12-31 DIAGNOSIS — Z992 Dependence on renal dialysis: Secondary | ICD-10-CM | POA: Diagnosis not present

## 2022-12-31 DIAGNOSIS — N2581 Secondary hyperparathyroidism of renal origin: Secondary | ICD-10-CM | POA: Diagnosis not present

## 2022-12-31 DIAGNOSIS — N186 End stage renal disease: Secondary | ICD-10-CM | POA: Diagnosis not present

## 2023-01-03 DIAGNOSIS — N2581 Secondary hyperparathyroidism of renal origin: Secondary | ICD-10-CM | POA: Diagnosis not present

## 2023-01-03 DIAGNOSIS — Z992 Dependence on renal dialysis: Secondary | ICD-10-CM | POA: Diagnosis not present

## 2023-01-03 DIAGNOSIS — N186 End stage renal disease: Secondary | ICD-10-CM | POA: Diagnosis not present

## 2023-01-04 DIAGNOSIS — N186 End stage renal disease: Secondary | ICD-10-CM | POA: Diagnosis not present

## 2023-01-04 DIAGNOSIS — I5082 Biventricular heart failure: Secondary | ICD-10-CM | POA: Diagnosis not present

## 2023-01-04 DIAGNOSIS — E1122 Type 2 diabetes mellitus with diabetic chronic kidney disease: Secondary | ICD-10-CM | POA: Diagnosis not present

## 2023-01-04 DIAGNOSIS — E559 Vitamin D deficiency, unspecified: Secondary | ICD-10-CM | POA: Diagnosis not present

## 2023-01-04 DIAGNOSIS — I480 Paroxysmal atrial fibrillation: Secondary | ICD-10-CM | POA: Diagnosis not present

## 2023-01-04 DIAGNOSIS — I132 Hypertensive heart and chronic kidney disease with heart failure and with stage 5 chronic kidney disease, or end stage renal disease: Secondary | ICD-10-CM | POA: Diagnosis not present

## 2023-01-04 DIAGNOSIS — E785 Hyperlipidemia, unspecified: Secondary | ICD-10-CM | POA: Diagnosis not present

## 2023-01-04 DIAGNOSIS — E1165 Type 2 diabetes mellitus with hyperglycemia: Secondary | ICD-10-CM | POA: Diagnosis not present

## 2023-01-04 DIAGNOSIS — I77810 Thoracic aortic ectasia: Secondary | ICD-10-CM | POA: Diagnosis not present

## 2023-01-05 DIAGNOSIS — N2581 Secondary hyperparathyroidism of renal origin: Secondary | ICD-10-CM | POA: Diagnosis not present

## 2023-01-05 DIAGNOSIS — N186 End stage renal disease: Secondary | ICD-10-CM | POA: Diagnosis not present

## 2023-01-05 DIAGNOSIS — Z992 Dependence on renal dialysis: Secondary | ICD-10-CM | POA: Diagnosis not present

## 2023-01-06 DIAGNOSIS — E1122 Type 2 diabetes mellitus with diabetic chronic kidney disease: Secondary | ICD-10-CM | POA: Diagnosis not present

## 2023-01-06 DIAGNOSIS — N186 End stage renal disease: Secondary | ICD-10-CM | POA: Diagnosis not present

## 2023-01-06 DIAGNOSIS — I5082 Biventricular heart failure: Secondary | ICD-10-CM | POA: Diagnosis not present

## 2023-01-06 DIAGNOSIS — E559 Vitamin D deficiency, unspecified: Secondary | ICD-10-CM | POA: Diagnosis not present

## 2023-01-06 DIAGNOSIS — E1165 Type 2 diabetes mellitus with hyperglycemia: Secondary | ICD-10-CM | POA: Diagnosis not present

## 2023-01-06 DIAGNOSIS — E785 Hyperlipidemia, unspecified: Secondary | ICD-10-CM | POA: Diagnosis not present

## 2023-01-06 DIAGNOSIS — I77810 Thoracic aortic ectasia: Secondary | ICD-10-CM | POA: Diagnosis not present

## 2023-01-06 DIAGNOSIS — I132 Hypertensive heart and chronic kidney disease with heart failure and with stage 5 chronic kidney disease, or end stage renal disease: Secondary | ICD-10-CM | POA: Diagnosis not present

## 2023-01-06 DIAGNOSIS — I480 Paroxysmal atrial fibrillation: Secondary | ICD-10-CM | POA: Diagnosis not present

## 2023-01-07 DIAGNOSIS — N2581 Secondary hyperparathyroidism of renal origin: Secondary | ICD-10-CM | POA: Diagnosis not present

## 2023-01-07 DIAGNOSIS — N186 End stage renal disease: Secondary | ICD-10-CM | POA: Diagnosis not present

## 2023-01-07 DIAGNOSIS — Z992 Dependence on renal dialysis: Secondary | ICD-10-CM | POA: Diagnosis not present

## 2023-01-10 DIAGNOSIS — N186 End stage renal disease: Secondary | ICD-10-CM | POA: Diagnosis not present

## 2023-01-10 DIAGNOSIS — N2581 Secondary hyperparathyroidism of renal origin: Secondary | ICD-10-CM | POA: Diagnosis not present

## 2023-01-10 DIAGNOSIS — Z992 Dependence on renal dialysis: Secondary | ICD-10-CM | POA: Diagnosis not present

## 2023-01-11 DIAGNOSIS — I132 Hypertensive heart and chronic kidney disease with heart failure and with stage 5 chronic kidney disease, or end stage renal disease: Secondary | ICD-10-CM | POA: Diagnosis not present

## 2023-01-11 DIAGNOSIS — E559 Vitamin D deficiency, unspecified: Secondary | ICD-10-CM | POA: Diagnosis not present

## 2023-01-11 DIAGNOSIS — I77810 Thoracic aortic ectasia: Secondary | ICD-10-CM | POA: Diagnosis not present

## 2023-01-11 DIAGNOSIS — I480 Paroxysmal atrial fibrillation: Secondary | ICD-10-CM | POA: Diagnosis not present

## 2023-01-11 DIAGNOSIS — I5082 Biventricular heart failure: Secondary | ICD-10-CM | POA: Diagnosis not present

## 2023-01-11 DIAGNOSIS — E1165 Type 2 diabetes mellitus with hyperglycemia: Secondary | ICD-10-CM | POA: Diagnosis not present

## 2023-01-11 DIAGNOSIS — E785 Hyperlipidemia, unspecified: Secondary | ICD-10-CM | POA: Diagnosis not present

## 2023-01-11 DIAGNOSIS — N186 End stage renal disease: Secondary | ICD-10-CM | POA: Diagnosis not present

## 2023-01-11 DIAGNOSIS — E1122 Type 2 diabetes mellitus with diabetic chronic kidney disease: Secondary | ICD-10-CM | POA: Diagnosis not present

## 2023-01-12 DIAGNOSIS — N2581 Secondary hyperparathyroidism of renal origin: Secondary | ICD-10-CM | POA: Diagnosis not present

## 2023-01-12 DIAGNOSIS — N186 End stage renal disease: Secondary | ICD-10-CM | POA: Diagnosis not present

## 2023-01-12 DIAGNOSIS — Z992 Dependence on renal dialysis: Secondary | ICD-10-CM | POA: Diagnosis not present

## 2023-01-13 DIAGNOSIS — H3581 Retinal edema: Secondary | ICD-10-CM | POA: Diagnosis not present

## 2023-01-13 DIAGNOSIS — H35052 Retinal neovascularization, unspecified, left eye: Secondary | ICD-10-CM | POA: Diagnosis not present

## 2023-01-13 DIAGNOSIS — H401123 Primary open-angle glaucoma, left eye, severe stage: Secondary | ICD-10-CM | POA: Diagnosis not present

## 2023-01-13 DIAGNOSIS — H4051X3 Glaucoma secondary to other eye disorders, right eye, severe stage: Secondary | ICD-10-CM | POA: Diagnosis not present

## 2023-01-13 DIAGNOSIS — H3533 Angioid streaks of macula: Secondary | ICD-10-CM | POA: Diagnosis not present

## 2023-01-14 DIAGNOSIS — Z992 Dependence on renal dialysis: Secondary | ICD-10-CM | POA: Diagnosis not present

## 2023-01-14 DIAGNOSIS — N2581 Secondary hyperparathyroidism of renal origin: Secondary | ICD-10-CM | POA: Diagnosis not present

## 2023-01-14 DIAGNOSIS — N186 End stage renal disease: Secondary | ICD-10-CM | POA: Diagnosis not present

## 2023-01-17 DIAGNOSIS — N2581 Secondary hyperparathyroidism of renal origin: Secondary | ICD-10-CM | POA: Diagnosis not present

## 2023-01-17 DIAGNOSIS — N186 End stage renal disease: Secondary | ICD-10-CM | POA: Diagnosis not present

## 2023-01-17 DIAGNOSIS — Z992 Dependence on renal dialysis: Secondary | ICD-10-CM | POA: Diagnosis not present

## 2023-01-18 DIAGNOSIS — E1165 Type 2 diabetes mellitus with hyperglycemia: Secondary | ICD-10-CM | POA: Diagnosis not present

## 2023-01-18 DIAGNOSIS — E559 Vitamin D deficiency, unspecified: Secondary | ICD-10-CM | POA: Diagnosis not present

## 2023-01-18 DIAGNOSIS — I132 Hypertensive heart and chronic kidney disease with heart failure and with stage 5 chronic kidney disease, or end stage renal disease: Secondary | ICD-10-CM | POA: Diagnosis not present

## 2023-01-18 DIAGNOSIS — N186 End stage renal disease: Secondary | ICD-10-CM | POA: Diagnosis not present

## 2023-01-18 DIAGNOSIS — I77810 Thoracic aortic ectasia: Secondary | ICD-10-CM | POA: Diagnosis not present

## 2023-01-18 DIAGNOSIS — E785 Hyperlipidemia, unspecified: Secondary | ICD-10-CM | POA: Diagnosis not present

## 2023-01-18 DIAGNOSIS — I480 Paroxysmal atrial fibrillation: Secondary | ICD-10-CM | POA: Diagnosis not present

## 2023-01-18 DIAGNOSIS — E1122 Type 2 diabetes mellitus with diabetic chronic kidney disease: Secondary | ICD-10-CM | POA: Diagnosis not present

## 2023-01-18 DIAGNOSIS — I5082 Biventricular heart failure: Secondary | ICD-10-CM | POA: Diagnosis not present

## 2023-01-19 DIAGNOSIS — N186 End stage renal disease: Secondary | ICD-10-CM | POA: Diagnosis not present

## 2023-01-19 DIAGNOSIS — N2581 Secondary hyperparathyroidism of renal origin: Secondary | ICD-10-CM | POA: Diagnosis not present

## 2023-01-19 DIAGNOSIS — Z992 Dependence on renal dialysis: Secondary | ICD-10-CM | POA: Diagnosis not present

## 2023-01-21 DIAGNOSIS — N186 End stage renal disease: Secondary | ICD-10-CM | POA: Diagnosis not present

## 2023-01-21 DIAGNOSIS — N2581 Secondary hyperparathyroidism of renal origin: Secondary | ICD-10-CM | POA: Diagnosis not present

## 2023-01-21 DIAGNOSIS — Z992 Dependence on renal dialysis: Secondary | ICD-10-CM | POA: Diagnosis not present

## 2023-01-23 DIAGNOSIS — N2581 Secondary hyperparathyroidism of renal origin: Secondary | ICD-10-CM | POA: Diagnosis not present

## 2023-01-23 DIAGNOSIS — N186 End stage renal disease: Secondary | ICD-10-CM | POA: Diagnosis not present

## 2023-01-23 DIAGNOSIS — Z992 Dependence on renal dialysis: Secondary | ICD-10-CM | POA: Diagnosis not present

## 2023-01-25 DIAGNOSIS — N2581 Secondary hyperparathyroidism of renal origin: Secondary | ICD-10-CM | POA: Diagnosis not present

## 2023-01-25 DIAGNOSIS — Z992 Dependence on renal dialysis: Secondary | ICD-10-CM | POA: Diagnosis not present

## 2023-01-25 DIAGNOSIS — N186 End stage renal disease: Secondary | ICD-10-CM | POA: Diagnosis not present

## 2023-01-28 DIAGNOSIS — N2581 Secondary hyperparathyroidism of renal origin: Secondary | ICD-10-CM | POA: Diagnosis not present

## 2023-01-28 DIAGNOSIS — N186 End stage renal disease: Secondary | ICD-10-CM | POA: Diagnosis not present

## 2023-01-28 DIAGNOSIS — N170 Acute kidney failure with tubular necrosis: Secondary | ICD-10-CM | POA: Diagnosis not present

## 2023-01-28 DIAGNOSIS — Z992 Dependence on renal dialysis: Secondary | ICD-10-CM | POA: Diagnosis not present

## 2023-01-30 ENCOUNTER — Telehealth: Payer: Self-pay | Admitting: Urology

## 2023-01-30 NOTE — Telephone Encounter (Signed)
Patient was supposed to get his PSA drawn at his dialysis center in [place] in October 2024.  Please check and see if this was done.  I do not see any recent results scanned in media

## 2023-01-31 ENCOUNTER — Other Ambulatory Visit: Payer: Self-pay | Admitting: *Deleted

## 2023-01-31 DIAGNOSIS — N2581 Secondary hyperparathyroidism of renal origin: Secondary | ICD-10-CM | POA: Diagnosis not present

## 2023-01-31 DIAGNOSIS — Z992 Dependence on renal dialysis: Secondary | ICD-10-CM | POA: Diagnosis not present

## 2023-01-31 DIAGNOSIS — C61 Malignant neoplasm of prostate: Secondary | ICD-10-CM

## 2023-01-31 DIAGNOSIS — N186 End stage renal disease: Secondary | ICD-10-CM | POA: Diagnosis not present

## 2023-01-31 NOTE — Telephone Encounter (Signed)
Will get psa sent over to [place]. If not had it done they will call us back to scheduled appt. For psa

## 2023-01-31 NOTE — Telephone Encounter (Signed)
Appt made for 02/03/2023 here in [place]

## 2023-02-01 ENCOUNTER — Ambulatory Visit (HOSPITAL_COMMUNITY)
Admission: RE | Admit: 2023-02-01 | Discharge: 2023-02-01 | Disposition: A | Discharge status: Home / Self Care | Payer: Medicare PPO | Attending: Nephrology | Admitting: Nephrology

## 2023-02-01 ENCOUNTER — Encounter (HOSPITAL_COMMUNITY): Payer: Self-pay | Admitting: Nephrology

## 2023-02-01 ENCOUNTER — Other Ambulatory Visit: Payer: Self-pay

## 2023-02-01 ENCOUNTER — Encounter (HOSPITAL_COMMUNITY)
Admission: RE | Disposition: A | Discharge status: Home / Self Care | Payer: Self-pay | Source: Home / Self Care | Attending: Nephrology

## 2023-02-01 DIAGNOSIS — Z87891 Personal history of nicotine dependence: Secondary | ICD-10-CM | POA: Insufficient documentation

## 2023-02-01 DIAGNOSIS — N186 End stage renal disease: Secondary | ICD-10-CM | POA: Diagnosis not present

## 2023-02-01 DIAGNOSIS — E1122 Type 2 diabetes mellitus with diabetic chronic kidney disease: Secondary | ICD-10-CM | POA: Diagnosis not present

## 2023-02-01 DIAGNOSIS — Z992 Dependence on renal dialysis: Secondary | ICD-10-CM | POA: Diagnosis not present

## 2023-02-01 DIAGNOSIS — D649 Anemia, unspecified: Secondary | ICD-10-CM | POA: Diagnosis not present

## 2023-02-01 DIAGNOSIS — N250 Renal osteodystrophy: Secondary | ICD-10-CM | POA: Diagnosis not present

## 2023-02-01 DIAGNOSIS — I120 Hypertensive chronic kidney disease with stage 5 chronic kidney disease or end stage renal disease: Secondary | ICD-10-CM | POA: Insufficient documentation

## 2023-02-01 DIAGNOSIS — Y832 Surgical operation with anastomosis, bypass or graft as the cause of abnormal reaction of the patient, or of later complication, without mention of misadventure at the time of the procedure: Secondary | ICD-10-CM | POA: Diagnosis not present

## 2023-02-01 DIAGNOSIS — Z7901 Long term (current) use of anticoagulants: Secondary | ICD-10-CM | POA: Diagnosis not present

## 2023-02-01 DIAGNOSIS — T82858A Stenosis of vascular prosthetic devices, implants and grafts, initial encounter: Secondary | ICD-10-CM | POA: Diagnosis not present

## 2023-02-01 DIAGNOSIS — H548 Legal blindness, as defined in USA: Secondary | ICD-10-CM | POA: Diagnosis not present

## 2023-02-01 HISTORY — PX: PERIPHERAL VASCULAR BALLOON ANGIOPLASTY: CATH118281

## 2023-02-01 HISTORY — PX: A/V FISTULAGRAM: CATH118298

## 2023-02-01 SURGERY — A/V FISTULAGRAM
Anesthesia: LOCAL

## 2023-02-01 MED ORDER — LIDOCAINE HCL (PF) 1 % IJ SOLN
INTRAMUSCULAR | Status: DC | PRN
  Administered 2023-02-01 (×2): 2 mL via INTRADERMAL

## 2023-02-01 MED ORDER — ONDANSETRON HCL 4 MG/2ML IJ SOLN: 4.0000 mg | Freq: Four times a day (QID) | INTRAMUSCULAR | Status: DC | PRN

## 2023-02-01 MED ORDER — FENTANYL CITRATE (PF) 100 MCG/2ML IJ SOLN
INTRAMUSCULAR | Status: DC | PRN
  Administered 2023-02-01: 25 ug via INTRAVENOUS

## 2023-02-01 MED ORDER — SODIUM CHLORIDE 0.9% FLUSH: 10.0000 mL | Freq: Two times a day (BID) | INTRAVENOUS | Status: DC

## 2023-02-01 MED ORDER — HEPARIN (PORCINE) IN NACL 1000-0.9 UT/500ML-% IV SOLN
INTRAVENOUS | Status: DC | PRN
  Administered 2023-02-01: 500 mL

## 2023-02-01 MED ORDER — SODIUM CHLORIDE 0.9% FLUSH: 3.0000 mL | INTRAVENOUS | Status: DC | PRN

## 2023-02-01 MED ORDER — MIDAZOLAM HCL 2 MG/2ML IJ SOLN
INTRAMUSCULAR | Status: DC | PRN
  Administered 2023-02-01: 1 mg via INTRAVENOUS

## 2023-02-01 MED ORDER — LIDOCAINE HCL (PF) 1 % IJ SOLN
INTRAMUSCULAR | Status: AC
  Filled 2023-02-01: qty 30

## 2023-02-01 MED ORDER — FENTANYL CITRATE (PF) 100 MCG/2ML IJ SOLN
INTRAMUSCULAR | Status: AC
  Filled 2023-02-01: qty 2

## 2023-02-01 MED ORDER — ACETAMINOPHEN 325 MG PO TABS: 650.0000 mg | ORAL_TABLET | ORAL | Status: DC | PRN

## 2023-02-01 MED ORDER — MIDAZOLAM HCL 2 MG/2ML IJ SOLN
INTRAMUSCULAR | Status: AC
Start: 2023-02-01 — End: ?
  Filled 2023-02-01: qty 2

## 2023-02-01 MED ORDER — IODIXANOL 320 MG/ML IV SOLN
INTRAVENOUS | Status: DC | PRN
  Administered 2023-02-01: 20 mL

## 2023-02-01 SURGICAL SUPPLY — 20 items
BAG SNAP BAND KOVER 36X36 (MISCELLANEOUS) ×3 IMPLANT
BALLN MUSTANG 6.0X40 75 (BALLOONS) ×2
BALLN MUSTANG 8.0X40 75 (BALLOONS) ×2
BALLOON MUSTANG 6.0X40 75 (BALLOONS) IMPLANT
BALLOON MUSTANG 8.0X40 75 (BALLOONS) IMPLANT
CATH SLIP KMP 65CM 5FR (CATHETERS) IMPLANT
COVER DOME SNAP 22 D (MISCELLANEOUS) ×3 IMPLANT
GUIDEWIRE ANGLED .035X150CM (WIRE) IMPLANT
PROTECTION STATION PRESSURIZED (MISCELLANEOUS) ×2
SHEATH PINNACLE 7F 10CM (SHEATH) IMPLANT
SHEATH PINNACLE R/O II 6F 4CM (SHEATH) IMPLANT
SHEATH PINNACLE R/O II 7F 4CM (SHEATH) IMPLANT
SHEATH PROBE COVER 6X72 (BAG) ×3 IMPLANT
STATION PROTECTION PRESSURIZED (MISCELLANEOUS) ×3 IMPLANT
STOPCOCK MORSE 400PSI 3WAY (MISCELLANEOUS) ×3 IMPLANT
SYR MEDALLION 10ML (SYRINGE) IMPLANT
TRAY PV CATH (CUSTOM PROCEDURE TRAY) ×3 IMPLANT
TUBING CIL FLEX 10 FLL-RA (TUBING) ×3 IMPLANT
WIRE EMERALD 3MM-J .035X150CM (WIRE) IMPLANT
WIRE STARTER BENTSON 035X150 (WIRE) IMPLANT

## 2023-02-01 NOTE — H&P (Signed)
Chief Complaint: Decreased flows  Interval H&P  The patient has presented today for an angiogram/ angioplasty; patient is followed at [facility] with Dr. Valentino Nose.  Various methods of treatment have been discussed with the patient.  After consideration of risk, benefits and other options for treatment, the patient has consented to a angiogram/ angioplasty with  possible stent placement.   Risks of angiogram with potential angioplasty and stenting if needed.contrast reaction, extravasation/ bleeding, dissection, hypotension and death were explained to the patient.  The patient's history has been reviewed and the patient has been examined, no changes in status.  Stable for angiogram/angioplasty  I have reviewed the patient's chart and labs.  Questions were answered to the patient's satisfaction.  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 Decreased access flows - planning on angiogram with possibly angioplasty of the inflow today. History of inflow 8 mm angioplasty as well as a 6 mm juxta anastomotic angioplasty on September 14, 2022. Renal osteodystrophy - continue binders per home regimen. Anemia - managed with ESA's and IV iron at dialysis center. HTN - resume home regimen. Atrial fibrillation DM - never on insulin but was on Metformin. Legally blind.   HPI: Austin Wheeler is an 82 y.o. male hypertension, diabetes, gout, atrial fibrillation, end-stage renal disease dialyzing at [facility] Tuesday Thursdays and Saturday second shift.  Patient had a second stage transposition of a right-sided brachiobasilic fistula by Dr. Chestine Spore on 12/13/2021.  He last had an inflow 8 mm angioplasty as well as a 6 mm juxta anastomotic angioplasty on September 14, 2022. Patient denies fever, chills, nausea, vomiting, shortness of breath, chest pain.  ROS Per HPI.  Chemistry and CBC: Creatinine, Ser  Date/Time Value Ref Range Status  02/16/2022 07:50 PM 7.63 (H) 0.61 - 1.24  mg/dL Final  52/84/1324 40:10 AM 6.10 (H) 0.61 - 1.24 mg/dL Final  27/25/3664 40:34 AM 4.70 (H) 0.61 - 1.24 mg/dL Final  74/25/9563 87:56 AM 4.70 (H) 0.61 - 1.24 mg/dL Final  43/32/9518 84:16 AM 4.35 (H) 0.61 - 1.24 mg/dL Final  60/63/0160 10:93 AM 5.69 (H) 0.61 - 1.24 mg/dL Final  23/55/7322 02:54 AM 4.74 (H) 0.61 - 1.24 mg/dL Final  27/07/2374 28:31 AM 6.21 (H) 0.61 - 1.24 mg/dL Final  51/76/1607 37:10 AM 5.24 (H) 0.61 - 1.24 mg/dL Final  62/69/4854 62:70 PM 4.85 (H) 0.61 - 1.24 mg/dL Final  35/00/9381 82:99 AM 4.23 (H) 0.61 - 1.24 mg/dL Final  37/16/9678 93:81 PM 3.67 (H) 0.61 - 1.24 mg/dL Final  01/75/1025 85:27 AM 5.29 (H) 0.61 - 1.24 mg/dL Final  78/24/2353 61:44 PM 4.96 (H) 0.61 - 1.24 mg/dL Final  31/54/0086 76:19 AM 4.47 (H) 0.61 - 1.24 mg/dL Final  50/93/2671 24:58 PM 4.00 (H) 0.61 - 1.24 mg/dL Final  09/98/3382 50:53 AM 5.53 (H) 0.61 - 1.24 mg/dL Final  97/67/3419 37:90 PM 5.25 (H) 0.61 - 1.24 mg/dL Final  24/10/7351 29:92 AM 4.61 (H) 0.61 - 1.24 mg/dL Final  42/68/3419 62:22 PM 4.16 (H) 0.61 - 1.24 mg/dL Final  97/98/9211 94:17 AM 5.75 (H) 0.61 - 1.24 mg/dL Final  40/81/4481 85:63 PM 5.58 (H) 0.61 - 1.24 mg/dL Final  14/97/0263 78:58 AM 5.05 (H) 0.61 - 1.24 mg/dL Final  85/04/7739 28:78 PM 4.51 (H) 0.61 - 1.24 mg/dL Final  67/67/2094 70:96 PM 3.32 (H) 0.61 - 1.24 mg/dL Final  28/36/6294 76:54 AM 4.86 (H) 0.61 - 1.24 mg/dL Final  65/04/5463 68:12 PM 4.53 (H) 0.61 - 1.24 mg/dL Final  75/17/0017  04:37 AM 3.94 (H) 0.61 - 1.24 mg/dL Final  12/25/2534 64:40 PM 3.61 (H) 0.61 - 1.24 mg/dL Final  34/74/2595 63:87 AM 5.02 (H) 0.61 - 1.24 mg/dL Final  56/43/3295 18:84 AM 4.96 (H) 0.61 - 1.24 mg/dL Final  16/60/6301 60:10 PM 4.59 (H) 0.61 - 1.24 mg/dL Final  93/23/5573 22:02 AM 6.00 (H) 0.61 - 1.24 mg/dL Final  54/27/0623 76:28 PM 7.24 (H) 0.61 - 1.24 mg/dL Final  31/51/7616 07:37 AM 9.20 (H) 0.61 - 1.24 mg/dL Final  10/62/6948 54:62 PM 10.65 (H) 0.61 - 1.24 mg/dL Final   70/35/0093 81:82 AM 13.05 (H) 0.61 - 1.24 mg/dL Final  99/37/1696 78:93 PM 16.29 (H) 0.61 - 1.24 mg/dL Final    Comment:    DIALYSIS  04/25/2021 02:31 AM 16.32 (H) 0.61 - 1.24 mg/dL Final  81/02/7508 25:85 AM 16.12 (H) 0.61 - 1.24 mg/dL Final  27/78/2423 53:61 PM 16.13 (H) 0.61 - 1.24 mg/dL Final  44/31/5400 86:76 AM 15.52 (H) 0.61 - 1.24 mg/dL Final  19/50/9326 71:24 AM 0.81 0.76 - 1.27 mg/dL Final  58/10/9831 82:50 PM 1.02 0.4 - 1.5 mg/dL Final   No results for input(s): "NA", "K", "CL", "CO2", "GLUCOSE", "BUN", "CREATININE", "CALCIUM", "PHOS" in the last 168 hours.  Invalid input(s): "ALB" No results for input(s): "WBC", "NEUTROABS", "HGB", "HCT", "MCV", "PLT" in the last 168 hours. Liver Function Tests: No results for input(s): "AST", "ALT", "ALKPHOS", "BILITOT", "PROT", "ALBUMIN" in the last 168 hours. No results for input(s): "LIPASE", "AMYLASE" in the last 168 hours. No results for input(s): "AMMONIA" in the last 168 hours. Cardiac Enzymes: No results for input(s): "CKTOTAL", "CKMB", "CKMBINDEX", "TROPONINI" in the last 168 hours. Iron Studies: No results for input(s): "IRON", "TIBC", "TRANSFERRIN", "FERRITIN" in the last 72 hours. PT/INR: @LABRCNTIP (inr:5)  Xrays/Other Studies: )No results found for this or any previous visit (from the past 48 hour(s)). No results found.  PMH:   Past Medical History:  Diagnosis Date   Anemia    Atrial fibrillation (HCC)    Blind    walker and wheelchair   BPH (benign prostatic hyperplasia)    Chronic kidney disease    Complication of anesthesia    Retention after eye surgery.  Patient's wife denies this dx as of 12/10/21.   Diabetes mellitus without complication (HCC)    Type II, does not check levels at home.   Glaucoma    Bilateral   Hypertension    Macular degeneration    Bilateral   NVG (neovascular glaucoma) 2023   bilateral - followed by Dr Lottie Dawson   Prostate cancer [facility])    Renal failure    dialysis T-TH-S   Dan Humphreys as  ambulation aid    also uses wheelchair    PSH:   Past Surgical History:  Procedure Laterality Date   AV FISTULA PLACEMENT Right 05/13/2021   Procedure: CREATION OF RIGHT ARM BRACHIOCEPHALIC FISTULA;  Surgeon: Maeola Harman, MD;  Location: [facility] OR;  Service: Vascular;  Laterality: Right;   AV FISTULA PLACEMENT Right 10/15/2021   Procedure: RIGHT ARTERIOVENOUS (AV) FISTULA CREATION VERSUS GRAFT;  Surgeon: Cephus Shelling, MD;  Location: [facility] OR;  Service: Vascular;  Laterality: Right;   BASCILIC VEIN TRANSPOSITION Right 12/13/2021   Procedure: RIGHT SECOND STAGE BASILIC VEIN TRANSPOSITION;  Surgeon: Cephus Shelling, MD;  Location: [facility] OR;  Service: Vascular;  Laterality: Right;   EYE SURGERY     Bilateral glaucoma and macular degeneration   FISTULA SUPERFICIALIZATION Right 07/16/2021   Procedure: RIGHT UPPER EXTREMITY FISTULA  SUPERFICIALIZATION WITH BRANCH LIGATION;  Surgeon: Maeola Harman, MD;  Location: [facility] OR;  Service: Vascular;  Laterality: Right;  Block   IR FLUORO GUIDE CV LINE RIGHT  05/03/2021   IR PARACENTESIS  04/23/2021   IR US GUIDE VASC ACCESS RIGHT  05/03/2021    Allergies:  Allergies  Allergen Reactions   Guaifenesin Swelling and Rash    Hand and feet swelling from Robitussin 50 years ago   Robitussin Dm Max Day-Night Other (See Comments)    Other reaction(s): feet and leg swelling 50 years ago   Lexapro [Escitalopram] Nausea And Vomiting    Reported by [facility] - pt does not recall    Medications:   Prior to Admission medications   Medication Sig Start Date End Date Taking? Authorizing Provider  acetaminophen (TYLENOL) 500 MG tablet Take 500 mg by mouth every 6 (six) hours as needed (pain.).   Yes [provider]  apixaban (ELIQUIS) 2.5 MG TABS tablet Take 1 tablet (2.5 mg total) by mouth 2 (two) times daily. 05/14/21  Yes Lynn Ito, MD  atropine 1 % ophthalmic solution Place 1 drop into the left eye at bedtime.  11/11/19  Yes [provider]  brimonidine (ALPHAGAN) 0.2 % ophthalmic solution Place 1 drop into the left eye 3 (three) times daily. 02/22/21  Yes [provider]  clobetasol cream (TEMOVATE) 0.05 % Apply 1 Application topically 2 (two) times daily. 11/09/22  Yes [provider]  dorzolamide-timolol (COSOPT) 22.3-6.8 MG/ML ophthalmic solution Place 1 drop into the left eye 2 (two) times daily. 04/11/12  Yes [provider]  dutasteride (AVODART) 0.5 MG capsule Take 1 capsule (0.5 mg total) by mouth every Monday, Wednesday, and Friday. 12/21/22  Yes Stoioff, Verna Czech, MD  latanoprost (XALATAN) 0.005 % ophthalmic solution Place 1 drop into the left eye at bedtime. 04/11/12  Yes [provider]  lidocaine-prilocaine (EMLA) cream Apply 1 Application topically daily as needed (prior to dialysis).   Yes [provider]  Nutritional Supplements (FEEDING SUPPLEMENT, NEPRO CARB STEADY,) LIQD Take 237 mLs by mouth 3 (three) times a week.   Yes [provider]  triamcinolone ointment (KENALOG) 0.1 % Apply 1 Application topically 2 (two) times daily as needed (wound care).   Yes [provider]    Discontinued Meds:   Medications Discontinued During This Encounter  Medication Reason   prednisoLONE acetate (PRED FORTE) 1 % ophthalmic suspension Patient Preference   olmesartan-hydrochlorothiazide (BENICAR HCT) 40-12.5 MG tablet Patient Preference   multivitamin (RENA-VIT) TABS tablet Patient Preference   aspirin EC 81 MG tablet Patient Preference   doxazosin (CARDURA) 8 MG tablet Patient Preference    Social History:  reports that he quit smoking about 5 years ago. His smoking use included cigarettes. He has never been exposed to tobacco smoke. He has never used smokeless tobacco. He reports that he does not currently use alcohol. He reports that he does not use drugs.  Family History:  No family history on file.  There were no vitals  taken for this visit. General: comfortably in bed in no distress Heart: RRR, no rub Lungs: clear bilateral chest rise with no increased work of breathing Abdomen: soft, nondistended Extremities: Trace edema in the bilateral lower extremities Dialysis Access:  Rt brachial basilic fistula with a hyper pulsatile inflow limb        Yamileth Hayse, Len Blalock, MD 02/01/2023, 10:29 AM

## 2023-02-01 NOTE — Op Note (Signed)
Patient presents for concerns of decreased access flows in his right  BBT; his last procedure was a 8 mm inflow basilic vein + 6mm JAS angioplasty on 09/14/22. .   On the physical exam, the fistula is hyperpulsatile at the inflow.  Summary:  1)      The patient had successful angioplasty (8 mm Mustang  FE ~18 atm) of significant 90% stenosis in the outflow basilic vein swing site.  2)      Successful angioplasty (6 mm Mustang  FE ~16 atm) of significant 70-80% stenosis in the inflow basilic vein swing site + arterial anastomosis. Flows improved after outflow angioplasty. 3)      This right BBT remains amenable to future percutaneous intervention as long as it remains patent at least 3 months.  Description of procedure: The arm was prepped and draped in the usual sterile fashion. The right upper arm brachial basilic fistula was cannulated (40981) with an 18G needle directed in an antegrade direction in arterial limb of the fistula. A guidewire was inserted and exchanged for a 7 Fr sheath.   We also had to cannulate in the retrograde direction with a 18-gauge needle exchanging out for a 7 French sheath.  Contrast [phone]) injection via the side port of the sheath was performed. The angiogram of the fistula (82956) showed a focal 80-90% stenosis in the outflow basilic swing site; the axillary vein, centrals, cannulation zone were patent. Flows were sluggish through the fistula.   I was able to advance a 0.035 hydrophilic wire through the retrograde sheath and manipulated into the proximal brachial artery.  I then advanced a Kumpe catheter to the tip was in the proximal brachial artery and arteriogram revealed a patent brachial artery, 70 to 80% arterial anastomotic, just anastomotic and inflow basilic vein stenosis with very poor flows through the fistula.  A 6 mm Mustang angioplasty balloon was then inserted over the guidewire and positioned at the basilic vein inflow + arterial anastomotic stenosis.    Venous + arterial angioplasty (21308) was carried out to 16 ATM with FULL effacement of the waist on the balloon along the entire inflow stenotic lesion sites. The repeat angiogram showed 10% residual stenosis at the inflow with no evidence of extravasation or dissection but flows were still very poor.  A 0.035 hydrophilic wire was then inserted through the sheath and parked in the central veins. A 8 mm Mustang angioplasty balloon was then inserted over the guidewire and positioned at the basilic vein outflow swing site stenosis.   Venous angioplasty (65784) was carried out to 18 ATM with FULL effacement of the waist on the balloon at the basilic outflow swing site lesion. The repeat angiogram showed 10% residual stenosis at the outflow basilic swing site  with no evidence of extravasation or dissection and now flows were very rapid through the fistula.  Hemostasis: A 3-0 ethilon purse string suture was placed at the cannulation site on removal of the sheath.  Sedation: 2mg  Versed, Fentanyl.  Contrast. 20 mL  Monitoring: Because of the patient's comorbid conditions and sedation during the procedure, continuous EKG monitoring and O2 saturation monitoring was performed throughout the procedure by the RN. There were no abnormal arrhythmias encountered.  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 Continue to cannulate the fistula with 15G needles.  2. Refer for problems with flows/swelling. 3. Remove the sutures next treatment.   Discharge: The patient was discharged home in stable condition. The patient was given education regarding the care of the dialysis access AVF and specific  instructions in case of any problems.

## 2023-02-01 NOTE — Discharge Instructions (Signed)

## 2023-02-02 DIAGNOSIS — N186 End stage renal disease: Secondary | ICD-10-CM | POA: Diagnosis not present

## 2023-02-02 DIAGNOSIS — Z992 Dependence on renal dialysis: Secondary | ICD-10-CM | POA: Diagnosis not present

## 2023-02-02 DIAGNOSIS — N2581 Secondary hyperparathyroidism of renal origin: Secondary | ICD-10-CM | POA: Diagnosis not present

## 2023-02-03 ENCOUNTER — Other Ambulatory Visit: Payer: Medicare PPO

## 2023-02-03 DIAGNOSIS — N401 Enlarged prostate with lower urinary tract symptoms: Secondary | ICD-10-CM | POA: Diagnosis not present

## 2023-02-04 DIAGNOSIS — N186 End stage renal disease: Secondary | ICD-10-CM | POA: Diagnosis not present

## 2023-02-04 DIAGNOSIS — N2581 Secondary hyperparathyroidism of renal origin: Secondary | ICD-10-CM | POA: Diagnosis not present

## 2023-02-04 DIAGNOSIS — Z992 Dependence on renal dialysis: Secondary | ICD-10-CM | POA: Diagnosis not present

## 2023-02-04 LAB — PSA: Prostate Specific Ag, Serum: 4.7 ng/mL — ABNORMAL HIGH (ref 0.0–4.0)

## 2023-02-07 DIAGNOSIS — N2581 Secondary hyperparathyroidism of renal origin: Secondary | ICD-10-CM | POA: Diagnosis not present

## 2023-02-07 DIAGNOSIS — Z992 Dependence on renal dialysis: Secondary | ICD-10-CM | POA: Diagnosis not present

## 2023-02-07 DIAGNOSIS — N186 End stage renal disease: Secondary | ICD-10-CM | POA: Diagnosis not present

## 2023-02-08 DIAGNOSIS — N186 End stage renal disease: Secondary | ICD-10-CM | POA: Diagnosis not present

## 2023-02-08 DIAGNOSIS — I480 Paroxysmal atrial fibrillation: Secondary | ICD-10-CM | POA: Diagnosis not present

## 2023-02-08 DIAGNOSIS — E1122 Type 2 diabetes mellitus with diabetic chronic kidney disease: Secondary | ICD-10-CM | POA: Diagnosis not present

## 2023-02-08 DIAGNOSIS — I132 Hypertensive heart and chronic kidney disease with heart failure and with stage 5 chronic kidney disease, or end stage renal disease: Secondary | ICD-10-CM | POA: Diagnosis not present

## 2023-02-08 DIAGNOSIS — I5082 Biventricular heart failure: Secondary | ICD-10-CM | POA: Diagnosis not present

## 2023-02-08 DIAGNOSIS — E785 Hyperlipidemia, unspecified: Secondary | ICD-10-CM | POA: Diagnosis not present

## 2023-02-08 DIAGNOSIS — E1165 Type 2 diabetes mellitus with hyperglycemia: Secondary | ICD-10-CM | POA: Diagnosis not present

## 2023-02-08 DIAGNOSIS — E559 Vitamin D deficiency, unspecified: Secondary | ICD-10-CM | POA: Diagnosis not present

## 2023-02-08 DIAGNOSIS — I77810 Thoracic aortic ectasia: Secondary | ICD-10-CM | POA: Diagnosis not present

## 2023-02-09 DIAGNOSIS — N2581 Secondary hyperparathyroidism of renal origin: Secondary | ICD-10-CM | POA: Diagnosis not present

## 2023-02-09 DIAGNOSIS — N186 End stage renal disease: Secondary | ICD-10-CM | POA: Diagnosis not present

## 2023-02-09 DIAGNOSIS — Z992 Dependence on renal dialysis: Secondary | ICD-10-CM | POA: Diagnosis not present

## 2023-02-11 DIAGNOSIS — N186 End stage renal disease: Secondary | ICD-10-CM | POA: Diagnosis not present

## 2023-02-11 DIAGNOSIS — Z992 Dependence on renal dialysis: Secondary | ICD-10-CM | POA: Diagnosis not present

## 2023-02-11 DIAGNOSIS — N2581 Secondary hyperparathyroidism of renal origin: Secondary | ICD-10-CM | POA: Diagnosis not present

## 2023-02-13 DIAGNOSIS — H4051X3 Glaucoma secondary to other eye disorders, right eye, severe stage: Secondary | ICD-10-CM | POA: Diagnosis not present

## 2023-02-14 DIAGNOSIS — N2581 Secondary hyperparathyroidism of renal origin: Secondary | ICD-10-CM | POA: Diagnosis not present

## 2023-02-14 DIAGNOSIS — N186 End stage renal disease: Secondary | ICD-10-CM | POA: Diagnosis not present

## 2023-02-14 DIAGNOSIS — Z992 Dependence on renal dialysis: Secondary | ICD-10-CM | POA: Diagnosis not present

## 2023-02-15 DIAGNOSIS — I132 Hypertensive heart and chronic kidney disease with heart failure and with stage 5 chronic kidney disease, or end stage renal disease: Secondary | ICD-10-CM | POA: Diagnosis not present

## 2023-02-15 DIAGNOSIS — N186 End stage renal disease: Secondary | ICD-10-CM | POA: Diagnosis not present

## 2023-02-15 DIAGNOSIS — E785 Hyperlipidemia, unspecified: Secondary | ICD-10-CM | POA: Diagnosis not present

## 2023-02-15 DIAGNOSIS — I5082 Biventricular heart failure: Secondary | ICD-10-CM | POA: Diagnosis not present

## 2023-02-15 DIAGNOSIS — E559 Vitamin D deficiency, unspecified: Secondary | ICD-10-CM | POA: Diagnosis not present

## 2023-02-15 DIAGNOSIS — I480 Paroxysmal atrial fibrillation: Secondary | ICD-10-CM | POA: Diagnosis not present

## 2023-02-15 DIAGNOSIS — I77810 Thoracic aortic ectasia: Secondary | ICD-10-CM | POA: Diagnosis not present

## 2023-02-15 DIAGNOSIS — E1165 Type 2 diabetes mellitus with hyperglycemia: Secondary | ICD-10-CM | POA: Diagnosis not present

## 2023-02-15 DIAGNOSIS — E1122 Type 2 diabetes mellitus with diabetic chronic kidney disease: Secondary | ICD-10-CM | POA: Diagnosis not present

## 2023-02-16 DIAGNOSIS — N2581 Secondary hyperparathyroidism of renal origin: Secondary | ICD-10-CM | POA: Diagnosis not present

## 2023-02-16 DIAGNOSIS — Z992 Dependence on renal dialysis: Secondary | ICD-10-CM | POA: Diagnosis not present

## 2023-02-16 DIAGNOSIS — N186 End stage renal disease: Secondary | ICD-10-CM | POA: Diagnosis not present

## 2023-02-18 DIAGNOSIS — N186 End stage renal disease: Secondary | ICD-10-CM | POA: Diagnosis not present

## 2023-02-18 DIAGNOSIS — Z992 Dependence on renal dialysis: Secondary | ICD-10-CM | POA: Diagnosis not present

## 2023-02-18 DIAGNOSIS — N2581 Secondary hyperparathyroidism of renal origin: Secondary | ICD-10-CM | POA: Diagnosis not present

## 2023-02-20 ENCOUNTER — Other Ambulatory Visit (HOSPITAL_COMMUNITY): Payer: Self-pay

## 2023-02-20 DIAGNOSIS — N2581 Secondary hyperparathyroidism of renal origin: Secondary | ICD-10-CM | POA: Diagnosis not present

## 2023-02-20 DIAGNOSIS — Z992 Dependence on renal dialysis: Secondary | ICD-10-CM | POA: Diagnosis not present

## 2023-02-20 DIAGNOSIS — N186 End stage renal disease: Secondary | ICD-10-CM | POA: Diagnosis not present

## 2023-02-23 ENCOUNTER — Other Ambulatory Visit: Payer: Self-pay

## 2023-02-23 DIAGNOSIS — E1165 Type 2 diabetes mellitus with hyperglycemia: Secondary | ICD-10-CM | POA: Diagnosis not present

## 2023-02-23 DIAGNOSIS — N186 End stage renal disease: Secondary | ICD-10-CM | POA: Diagnosis not present

## 2023-02-23 DIAGNOSIS — I5082 Biventricular heart failure: Secondary | ICD-10-CM | POA: Diagnosis not present

## 2023-02-23 DIAGNOSIS — E559 Vitamin D deficiency, unspecified: Secondary | ICD-10-CM | POA: Diagnosis not present

## 2023-02-23 DIAGNOSIS — E785 Hyperlipidemia, unspecified: Secondary | ICD-10-CM | POA: Diagnosis not present

## 2023-02-23 DIAGNOSIS — E1122 Type 2 diabetes mellitus with diabetic chronic kidney disease: Secondary | ICD-10-CM | POA: Diagnosis not present

## 2023-02-23 DIAGNOSIS — I132 Hypertensive heart and chronic kidney disease with heart failure and with stage 5 chronic kidney disease, or end stage renal disease: Secondary | ICD-10-CM | POA: Diagnosis not present

## 2023-02-23 DIAGNOSIS — I480 Paroxysmal atrial fibrillation: Secondary | ICD-10-CM | POA: Diagnosis not present

## 2023-02-23 DIAGNOSIS — Z992 Dependence on renal dialysis: Secondary | ICD-10-CM | POA: Diagnosis not present

## 2023-02-23 DIAGNOSIS — I77810 Thoracic aortic ectasia: Secondary | ICD-10-CM | POA: Diagnosis not present

## 2023-02-23 DIAGNOSIS — N2581 Secondary hyperparathyroidism of renal origin: Secondary | ICD-10-CM | POA: Diagnosis not present

## 2023-02-23 MED ORDER — LIDOCAINE-PRILOCAINE 2.5-2.5 % EX CREA
1.0000 | TOPICAL_CREAM | CUTANEOUS | 3 refills | Status: DC
  Filled 2023-02-23: qty 30, 7d supply, fill #0
  Filled 2023-06-02: qty 30, 7d supply, fill #1
  Filled 2023-08-07: qty 30, 7d supply, fill #2
  Filled 2023-10-06: qty 30, 7d supply, fill #3

## 2023-02-24 ENCOUNTER — Other Ambulatory Visit: Payer: Self-pay

## 2023-02-25 DIAGNOSIS — N2581 Secondary hyperparathyroidism of renal origin: Secondary | ICD-10-CM | POA: Diagnosis not present

## 2023-02-25 DIAGNOSIS — N186 End stage renal disease: Secondary | ICD-10-CM | POA: Diagnosis not present

## 2023-02-25 DIAGNOSIS — Z992 Dependence on renal dialysis: Secondary | ICD-10-CM | POA: Diagnosis not present

## 2023-02-27 DIAGNOSIS — N186 End stage renal disease: Secondary | ICD-10-CM | POA: Diagnosis not present

## 2023-02-27 DIAGNOSIS — N2581 Secondary hyperparathyroidism of renal origin: Secondary | ICD-10-CM | POA: Diagnosis not present

## 2023-02-27 DIAGNOSIS — Z992 Dependence on renal dialysis: Secondary | ICD-10-CM | POA: Diagnosis not present

## 2023-02-28 DIAGNOSIS — N170 Acute kidney failure with tubular necrosis: Secondary | ICD-10-CM | POA: Diagnosis not present

## 2023-02-28 DIAGNOSIS — N186 End stage renal disease: Secondary | ICD-10-CM | POA: Diagnosis not present

## 2023-02-28 DIAGNOSIS — Z992 Dependence on renal dialysis: Secondary | ICD-10-CM | POA: Diagnosis not present

## 2023-03-02 DIAGNOSIS — Z992 Dependence on renal dialysis: Secondary | ICD-10-CM | POA: Diagnosis not present

## 2023-03-02 DIAGNOSIS — N186 End stage renal disease: Secondary | ICD-10-CM | POA: Diagnosis not present

## 2023-03-02 DIAGNOSIS — N2581 Secondary hyperparathyroidism of renal origin: Secondary | ICD-10-CM | POA: Diagnosis not present

## 2023-03-03 ENCOUNTER — Ambulatory Visit: Payer: Medicare PPO | Admitting: Podiatry

## 2023-03-04 DIAGNOSIS — N186 End stage renal disease: Secondary | ICD-10-CM | POA: Diagnosis not present

## 2023-03-04 DIAGNOSIS — N2581 Secondary hyperparathyroidism of renal origin: Secondary | ICD-10-CM | POA: Diagnosis not present

## 2023-03-04 DIAGNOSIS — Z992 Dependence on renal dialysis: Secondary | ICD-10-CM | POA: Diagnosis not present

## 2023-03-07 ENCOUNTER — Other Ambulatory Visit: Payer: Self-pay

## 2023-03-07 DIAGNOSIS — N186 End stage renal disease: Secondary | ICD-10-CM | POA: Diagnosis not present

## 2023-03-07 DIAGNOSIS — N2581 Secondary hyperparathyroidism of renal origin: Secondary | ICD-10-CM | POA: Diagnosis not present

## 2023-03-07 DIAGNOSIS — Z992 Dependence on renal dialysis: Secondary | ICD-10-CM | POA: Diagnosis not present

## 2023-03-08 ENCOUNTER — Other Ambulatory Visit: Payer: Self-pay

## 2023-03-09 DIAGNOSIS — N186 End stage renal disease: Secondary | ICD-10-CM | POA: Diagnosis not present

## 2023-03-09 DIAGNOSIS — Z992 Dependence on renal dialysis: Secondary | ICD-10-CM | POA: Diagnosis not present

## 2023-03-09 DIAGNOSIS — N2581 Secondary hyperparathyroidism of renal origin: Secondary | ICD-10-CM | POA: Diagnosis not present

## 2023-03-10 ENCOUNTER — Ambulatory Visit: Payer: Medicare PPO | Admitting: Podiatry

## 2023-03-10 ENCOUNTER — Encounter: Payer: Self-pay | Admitting: Podiatry

## 2023-03-10 DIAGNOSIS — E1169 Type 2 diabetes mellitus with other specified complication: Secondary | ICD-10-CM

## 2023-03-10 DIAGNOSIS — M79675 Pain in left toe(s): Secondary | ICD-10-CM

## 2023-03-10 DIAGNOSIS — B351 Tinea unguium: Secondary | ICD-10-CM

## 2023-03-10 DIAGNOSIS — M79674 Pain in right toe(s): Secondary | ICD-10-CM

## 2023-03-10 NOTE — Progress Notes (Signed)
 This patient presents  to my office for at risk foot care.  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  This patient is taking eliquis .This patient is unable to cut nails himself since the patient cannot reach his nails.These nails are painful walking and wearing shoes. He presents to the office with his wife. This patient presents for at risk foot care today.  General Appearance  Alert, conversant and in no acute stress.  Vascular  Dorsalis pedis and posterior tibial  pulses are  not palpable due to swelling  bilaterally.  Capillary return is within normal limits  bilaterally. Temperature is within normal limits  bilaterally.  Neurologic  Senn-Weinstein monofilament wire test diminished  bilaterally. Muscle power within normal limits bilaterally.  Nails Thick disfigured discolored nails with subungual debris  from hallux to fifth toes bilaterally. No evidence of bacterial infection or drainage bilaterally.  Orthopedic  No limitations of motion  feet .  No crepitus or effusions noted.  No bony pathology or digital deformities noted.  Skin  normotropic skin with no porokeratosis noted bilaterally.  No signs of infections or ulcers noted.     Onychomycosis  Pain in right toes  Pain in left toes  Consent was obtained for treatment procedures.   Mechanical debridement of nails 1-5  bilaterally performed with a nail nipper.  Filed with dremel without incident.    Return office visit  4    months                  Told patient to return for periodic foot care and evaluation due to potential at risk complications.   Ruffin Cotton DPM

## 2023-03-13 DIAGNOSIS — N186 End stage renal disease: Secondary | ICD-10-CM | POA: Diagnosis not present

## 2023-03-13 DIAGNOSIS — N2581 Secondary hyperparathyroidism of renal origin: Secondary | ICD-10-CM | POA: Diagnosis not present

## 2023-03-13 DIAGNOSIS — Z992 Dependence on renal dialysis: Secondary | ICD-10-CM | POA: Diagnosis not present

## 2023-03-14 ENCOUNTER — Telehealth: Payer: Self-pay | Admitting: Podiatry

## 2023-03-14 DIAGNOSIS — Z992 Dependence on renal dialysis: Secondary | ICD-10-CM | POA: Diagnosis not present

## 2023-03-14 DIAGNOSIS — N186 End stage renal disease: Secondary | ICD-10-CM | POA: Diagnosis not present

## 2023-03-14 DIAGNOSIS — N2581 Secondary hyperparathyroidism of renal origin: Secondary | ICD-10-CM | POA: Diagnosis not present

## 2023-03-14 NOTE — Telephone Encounter (Signed)
 Pts wife called and you had given the patient a pair of socks and they are wanting to know the name /brand of sock because he loves them.

## 2023-03-16 DIAGNOSIS — N186 End stage renal disease: Secondary | ICD-10-CM | POA: Diagnosis not present

## 2023-03-16 DIAGNOSIS — Z992 Dependence on renal dialysis: Secondary | ICD-10-CM | POA: Diagnosis not present

## 2023-03-16 DIAGNOSIS — N2581 Secondary hyperparathyroidism of renal origin: Secondary | ICD-10-CM | POA: Diagnosis not present

## 2023-03-18 DIAGNOSIS — Z992 Dependence on renal dialysis: Secondary | ICD-10-CM | POA: Diagnosis not present

## 2023-03-18 DIAGNOSIS — N2581 Secondary hyperparathyroidism of renal origin: Secondary | ICD-10-CM | POA: Diagnosis not present

## 2023-03-18 DIAGNOSIS — N186 End stage renal disease: Secondary | ICD-10-CM | POA: Diagnosis not present

## 2023-03-21 DIAGNOSIS — N186 End stage renal disease: Secondary | ICD-10-CM | POA: Diagnosis not present

## 2023-03-21 DIAGNOSIS — Z992 Dependence on renal dialysis: Secondary | ICD-10-CM | POA: Diagnosis not present

## 2023-03-21 DIAGNOSIS — N2581 Secondary hyperparathyroidism of renal origin: Secondary | ICD-10-CM | POA: Diagnosis not present

## 2023-03-23 DIAGNOSIS — N2581 Secondary hyperparathyroidism of renal origin: Secondary | ICD-10-CM | POA: Diagnosis not present

## 2023-03-23 DIAGNOSIS — Z992 Dependence on renal dialysis: Secondary | ICD-10-CM | POA: Diagnosis not present

## 2023-03-23 DIAGNOSIS — N186 End stage renal disease: Secondary | ICD-10-CM | POA: Diagnosis not present

## 2023-03-25 DIAGNOSIS — N186 End stage renal disease: Secondary | ICD-10-CM | POA: Diagnosis not present

## 2023-03-25 DIAGNOSIS — Z992 Dependence on renal dialysis: Secondary | ICD-10-CM | POA: Diagnosis not present

## 2023-03-25 DIAGNOSIS — N2581 Secondary hyperparathyroidism of renal origin: Secondary | ICD-10-CM | POA: Diagnosis not present

## 2023-03-28 DIAGNOSIS — N186 End stage renal disease: Secondary | ICD-10-CM | POA: Diagnosis not present

## 2023-03-28 DIAGNOSIS — N2581 Secondary hyperparathyroidism of renal origin: Secondary | ICD-10-CM | POA: Diagnosis not present

## 2023-03-28 DIAGNOSIS — Z992 Dependence on renal dialysis: Secondary | ICD-10-CM | POA: Diagnosis not present

## 2023-03-30 DIAGNOSIS — N186 End stage renal disease: Secondary | ICD-10-CM | POA: Diagnosis not present

## 2023-03-30 DIAGNOSIS — Z992 Dependence on renal dialysis: Secondary | ICD-10-CM | POA: Diagnosis not present

## 2023-03-30 DIAGNOSIS — N2581 Secondary hyperparathyroidism of renal origin: Secondary | ICD-10-CM | POA: Diagnosis not present

## 2023-03-31 DIAGNOSIS — E785 Hyperlipidemia, unspecified: Secondary | ICD-10-CM | POA: Diagnosis not present

## 2023-03-31 DIAGNOSIS — M109 Gout, unspecified: Secondary | ICD-10-CM | POA: Diagnosis not present

## 2023-03-31 DIAGNOSIS — E559 Vitamin D deficiency, unspecified: Secondary | ICD-10-CM | POA: Diagnosis not present

## 2023-03-31 DIAGNOSIS — I77810 Thoracic aortic ectasia: Secondary | ICD-10-CM | POA: Diagnosis not present

## 2023-03-31 DIAGNOSIS — N186 End stage renal disease: Secondary | ICD-10-CM | POA: Diagnosis not present

## 2023-03-31 DIAGNOSIS — N170 Acute kidney failure with tubular necrosis: Secondary | ICD-10-CM | POA: Diagnosis not present

## 2023-03-31 DIAGNOSIS — I5082 Biventricular heart failure: Secondary | ICD-10-CM | POA: Diagnosis not present

## 2023-03-31 DIAGNOSIS — Z992 Dependence on renal dialysis: Secondary | ICD-10-CM | POA: Diagnosis not present

## 2023-03-31 DIAGNOSIS — I480 Paroxysmal atrial fibrillation: Secondary | ICD-10-CM | POA: Diagnosis not present

## 2023-03-31 DIAGNOSIS — I132 Hypertensive heart and chronic kidney disease with heart failure and with stage 5 chronic kidney disease, or end stage renal disease: Secondary | ICD-10-CM | POA: Diagnosis not present

## 2023-03-31 DIAGNOSIS — E1122 Type 2 diabetes mellitus with diabetic chronic kidney disease: Secondary | ICD-10-CM | POA: Diagnosis not present

## 2023-04-01 DIAGNOSIS — Z992 Dependence on renal dialysis: Secondary | ICD-10-CM | POA: Diagnosis not present

## 2023-04-01 DIAGNOSIS — N186 End stage renal disease: Secondary | ICD-10-CM | POA: Diagnosis not present

## 2023-04-01 DIAGNOSIS — N2581 Secondary hyperparathyroidism of renal origin: Secondary | ICD-10-CM | POA: Diagnosis not present

## 2023-04-04 DIAGNOSIS — Z992 Dependence on renal dialysis: Secondary | ICD-10-CM | POA: Diagnosis not present

## 2023-04-04 DIAGNOSIS — N2581 Secondary hyperparathyroidism of renal origin: Secondary | ICD-10-CM | POA: Diagnosis not present

## 2023-04-04 DIAGNOSIS — N186 End stage renal disease: Secondary | ICD-10-CM | POA: Diagnosis not present

## 2023-04-05 DIAGNOSIS — E1122 Type 2 diabetes mellitus with diabetic chronic kidney disease: Secondary | ICD-10-CM | POA: Diagnosis not present

## 2023-04-05 DIAGNOSIS — L729 Follicular cyst of the skin and subcutaneous tissue, unspecified: Secondary | ICD-10-CM | POA: Diagnosis not present

## 2023-04-05 DIAGNOSIS — E1165 Type 2 diabetes mellitus with hyperglycemia: Secondary | ICD-10-CM | POA: Diagnosis not present

## 2023-04-05 DIAGNOSIS — I480 Paroxysmal atrial fibrillation: Secondary | ICD-10-CM | POA: Diagnosis not present

## 2023-04-05 DIAGNOSIS — N186 End stage renal disease: Secondary | ICD-10-CM | POA: Diagnosis not present

## 2023-04-05 DIAGNOSIS — I5082 Biventricular heart failure: Secondary | ICD-10-CM | POA: Diagnosis not present

## 2023-04-06 DIAGNOSIS — Z992 Dependence on renal dialysis: Secondary | ICD-10-CM | POA: Diagnosis not present

## 2023-04-06 DIAGNOSIS — N186 End stage renal disease: Secondary | ICD-10-CM | POA: Diagnosis not present

## 2023-04-06 DIAGNOSIS — N2581 Secondary hyperparathyroidism of renal origin: Secondary | ICD-10-CM | POA: Diagnosis not present

## 2023-04-07 DIAGNOSIS — I5082 Biventricular heart failure: Secondary | ICD-10-CM | POA: Diagnosis not present

## 2023-04-07 DIAGNOSIS — M109 Gout, unspecified: Secondary | ICD-10-CM | POA: Diagnosis not present

## 2023-04-07 DIAGNOSIS — E785 Hyperlipidemia, unspecified: Secondary | ICD-10-CM | POA: Diagnosis not present

## 2023-04-07 DIAGNOSIS — E559 Vitamin D deficiency, unspecified: Secondary | ICD-10-CM | POA: Diagnosis not present

## 2023-04-07 DIAGNOSIS — N186 End stage renal disease: Secondary | ICD-10-CM | POA: Diagnosis not present

## 2023-04-07 DIAGNOSIS — E1122 Type 2 diabetes mellitus with diabetic chronic kidney disease: Secondary | ICD-10-CM | POA: Diagnosis not present

## 2023-04-07 DIAGNOSIS — I132 Hypertensive heart and chronic kidney disease with heart failure and with stage 5 chronic kidney disease, or end stage renal disease: Secondary | ICD-10-CM | POA: Diagnosis not present

## 2023-04-07 DIAGNOSIS — I77810 Thoracic aortic ectasia: Secondary | ICD-10-CM | POA: Diagnosis not present

## 2023-04-07 DIAGNOSIS — I480 Paroxysmal atrial fibrillation: Secondary | ICD-10-CM | POA: Diagnosis not present

## 2023-04-08 DIAGNOSIS — N186 End stage renal disease: Secondary | ICD-10-CM | POA: Diagnosis not present

## 2023-04-08 DIAGNOSIS — Z992 Dependence on renal dialysis: Secondary | ICD-10-CM | POA: Diagnosis not present

## 2023-04-08 DIAGNOSIS — N2581 Secondary hyperparathyroidism of renal origin: Secondary | ICD-10-CM | POA: Diagnosis not present

## 2023-04-11 DIAGNOSIS — N186 End stage renal disease: Secondary | ICD-10-CM | POA: Diagnosis not present

## 2023-04-11 DIAGNOSIS — N2581 Secondary hyperparathyroidism of renal origin: Secondary | ICD-10-CM | POA: Diagnosis not present

## 2023-04-11 DIAGNOSIS — Z992 Dependence on renal dialysis: Secondary | ICD-10-CM | POA: Diagnosis not present

## 2023-04-13 DIAGNOSIS — N2581 Secondary hyperparathyroidism of renal origin: Secondary | ICD-10-CM | POA: Diagnosis not present

## 2023-04-13 DIAGNOSIS — N186 End stage renal disease: Secondary | ICD-10-CM | POA: Diagnosis not present

## 2023-04-13 DIAGNOSIS — Z992 Dependence on renal dialysis: Secondary | ICD-10-CM | POA: Diagnosis not present

## 2023-04-14 DIAGNOSIS — I480 Paroxysmal atrial fibrillation: Secondary | ICD-10-CM | POA: Diagnosis not present

## 2023-04-14 DIAGNOSIS — E1122 Type 2 diabetes mellitus with diabetic chronic kidney disease: Secondary | ICD-10-CM | POA: Diagnosis not present

## 2023-04-14 DIAGNOSIS — M109 Gout, unspecified: Secondary | ICD-10-CM | POA: Diagnosis not present

## 2023-04-14 DIAGNOSIS — L729 Follicular cyst of the skin and subcutaneous tissue, unspecified: Secondary | ICD-10-CM | POA: Diagnosis not present

## 2023-04-14 DIAGNOSIS — E785 Hyperlipidemia, unspecified: Secondary | ICD-10-CM | POA: Diagnosis not present

## 2023-04-14 DIAGNOSIS — I77810 Thoracic aortic ectasia: Secondary | ICD-10-CM | POA: Diagnosis not present

## 2023-04-14 DIAGNOSIS — I5082 Biventricular heart failure: Secondary | ICD-10-CM | POA: Diagnosis not present

## 2023-04-14 DIAGNOSIS — E1165 Type 2 diabetes mellitus with hyperglycemia: Secondary | ICD-10-CM | POA: Diagnosis not present

## 2023-04-14 DIAGNOSIS — N186 End stage renal disease: Secondary | ICD-10-CM | POA: Diagnosis not present

## 2023-04-14 DIAGNOSIS — E559 Vitamin D deficiency, unspecified: Secondary | ICD-10-CM | POA: Diagnosis not present

## 2023-04-14 DIAGNOSIS — I132 Hypertensive heart and chronic kidney disease with heart failure and with stage 5 chronic kidney disease, or end stage renal disease: Secondary | ICD-10-CM | POA: Diagnosis not present

## 2023-04-15 DIAGNOSIS — Z992 Dependence on renal dialysis: Secondary | ICD-10-CM | POA: Diagnosis not present

## 2023-04-15 DIAGNOSIS — N2581 Secondary hyperparathyroidism of renal origin: Secondary | ICD-10-CM | POA: Diagnosis not present

## 2023-04-15 DIAGNOSIS — N186 End stage renal disease: Secondary | ICD-10-CM | POA: Diagnosis not present

## 2023-04-18 DIAGNOSIS — N2581 Secondary hyperparathyroidism of renal origin: Secondary | ICD-10-CM | POA: Diagnosis not present

## 2023-04-18 DIAGNOSIS — Z992 Dependence on renal dialysis: Secondary | ICD-10-CM | POA: Diagnosis not present

## 2023-04-18 DIAGNOSIS — N186 End stage renal disease: Secondary | ICD-10-CM | POA: Diagnosis not present

## 2023-04-20 DIAGNOSIS — N186 End stage renal disease: Secondary | ICD-10-CM | POA: Diagnosis not present

## 2023-04-20 DIAGNOSIS — N2581 Secondary hyperparathyroidism of renal origin: Secondary | ICD-10-CM | POA: Diagnosis not present

## 2023-04-20 DIAGNOSIS — Z992 Dependence on renal dialysis: Secondary | ICD-10-CM | POA: Diagnosis not present

## 2023-04-21 DIAGNOSIS — I77810 Thoracic aortic ectasia: Secondary | ICD-10-CM | POA: Diagnosis not present

## 2023-04-21 DIAGNOSIS — I5082 Biventricular heart failure: Secondary | ICD-10-CM | POA: Diagnosis not present

## 2023-04-21 DIAGNOSIS — I480 Paroxysmal atrial fibrillation: Secondary | ICD-10-CM | POA: Diagnosis not present

## 2023-04-21 DIAGNOSIS — E785 Hyperlipidemia, unspecified: Secondary | ICD-10-CM | POA: Diagnosis not present

## 2023-04-21 DIAGNOSIS — I132 Hypertensive heart and chronic kidney disease with heart failure and with stage 5 chronic kidney disease, or end stage renal disease: Secondary | ICD-10-CM | POA: Diagnosis not present

## 2023-04-21 DIAGNOSIS — E559 Vitamin D deficiency, unspecified: Secondary | ICD-10-CM | POA: Diagnosis not present

## 2023-04-21 DIAGNOSIS — N186 End stage renal disease: Secondary | ICD-10-CM | POA: Diagnosis not present

## 2023-04-21 DIAGNOSIS — E1122 Type 2 diabetes mellitus with diabetic chronic kidney disease: Secondary | ICD-10-CM | POA: Diagnosis not present

## 2023-04-21 DIAGNOSIS — M109 Gout, unspecified: Secondary | ICD-10-CM | POA: Diagnosis not present

## 2023-04-22 DIAGNOSIS — N186 End stage renal disease: Secondary | ICD-10-CM | POA: Diagnosis not present

## 2023-04-22 DIAGNOSIS — Z992 Dependence on renal dialysis: Secondary | ICD-10-CM | POA: Diagnosis not present

## 2023-04-22 DIAGNOSIS — N2581 Secondary hyperparathyroidism of renal origin: Secondary | ICD-10-CM | POA: Diagnosis not present

## 2023-04-25 DIAGNOSIS — N2581 Secondary hyperparathyroidism of renal origin: Secondary | ICD-10-CM | POA: Diagnosis not present

## 2023-04-25 DIAGNOSIS — N186 End stage renal disease: Secondary | ICD-10-CM | POA: Diagnosis not present

## 2023-04-25 DIAGNOSIS — Z992 Dependence on renal dialysis: Secondary | ICD-10-CM | POA: Diagnosis not present

## 2023-04-27 DIAGNOSIS — N2581 Secondary hyperparathyroidism of renal origin: Secondary | ICD-10-CM | POA: Diagnosis not present

## 2023-04-27 DIAGNOSIS — N186 End stage renal disease: Secondary | ICD-10-CM | POA: Diagnosis not present

## 2023-04-27 DIAGNOSIS — Z992 Dependence on renal dialysis: Secondary | ICD-10-CM | POA: Diagnosis not present

## 2023-04-28 DIAGNOSIS — Z992 Dependence on renal dialysis: Secondary | ICD-10-CM | POA: Diagnosis not present

## 2023-04-28 DIAGNOSIS — N170 Acute kidney failure with tubular necrosis: Secondary | ICD-10-CM | POA: Diagnosis not present

## 2023-04-28 DIAGNOSIS — N186 End stage renal disease: Secondary | ICD-10-CM | POA: Diagnosis not present

## 2023-04-29 DIAGNOSIS — N186 End stage renal disease: Secondary | ICD-10-CM | POA: Diagnosis not present

## 2023-04-29 DIAGNOSIS — Z992 Dependence on renal dialysis: Secondary | ICD-10-CM | POA: Diagnosis not present

## 2023-04-29 DIAGNOSIS — N2581 Secondary hyperparathyroidism of renal origin: Secondary | ICD-10-CM | POA: Diagnosis not present

## 2023-05-02 DIAGNOSIS — N186 End stage renal disease: Secondary | ICD-10-CM | POA: Diagnosis not present

## 2023-05-02 DIAGNOSIS — Z992 Dependence on renal dialysis: Secondary | ICD-10-CM | POA: Diagnosis not present

## 2023-05-02 DIAGNOSIS — N2581 Secondary hyperparathyroidism of renal origin: Secondary | ICD-10-CM | POA: Diagnosis not present

## 2023-05-04 DIAGNOSIS — N2581 Secondary hyperparathyroidism of renal origin: Secondary | ICD-10-CM | POA: Diagnosis not present

## 2023-05-04 DIAGNOSIS — Z992 Dependence on renal dialysis: Secondary | ICD-10-CM | POA: Diagnosis not present

## 2023-05-04 DIAGNOSIS — N186 End stage renal disease: Secondary | ICD-10-CM | POA: Diagnosis not present

## 2023-05-06 DIAGNOSIS — Z992 Dependence on renal dialysis: Secondary | ICD-10-CM | POA: Diagnosis not present

## 2023-05-06 DIAGNOSIS — N186 End stage renal disease: Secondary | ICD-10-CM | POA: Diagnosis not present

## 2023-05-06 DIAGNOSIS — N2581 Secondary hyperparathyroidism of renal origin: Secondary | ICD-10-CM | POA: Diagnosis not present

## 2023-05-09 DIAGNOSIS — N2581 Secondary hyperparathyroidism of renal origin: Secondary | ICD-10-CM | POA: Diagnosis not present

## 2023-05-09 DIAGNOSIS — N186 End stage renal disease: Secondary | ICD-10-CM | POA: Diagnosis not present

## 2023-05-09 DIAGNOSIS — Z992 Dependence on renal dialysis: Secondary | ICD-10-CM | POA: Diagnosis not present

## 2023-05-11 DIAGNOSIS — Z992 Dependence on renal dialysis: Secondary | ICD-10-CM | POA: Diagnosis not present

## 2023-05-11 DIAGNOSIS — N186 End stage renal disease: Secondary | ICD-10-CM | POA: Diagnosis not present

## 2023-05-11 DIAGNOSIS — N2581 Secondary hyperparathyroidism of renal origin: Secondary | ICD-10-CM | POA: Diagnosis not present

## 2023-05-13 DIAGNOSIS — N2581 Secondary hyperparathyroidism of renal origin: Secondary | ICD-10-CM | POA: Diagnosis not present

## 2023-05-13 DIAGNOSIS — Z992 Dependence on renal dialysis: Secondary | ICD-10-CM | POA: Diagnosis not present

## 2023-05-13 DIAGNOSIS — N186 End stage renal disease: Secondary | ICD-10-CM | POA: Diagnosis not present

## 2023-05-16 DIAGNOSIS — Z992 Dependence on renal dialysis: Secondary | ICD-10-CM | POA: Diagnosis not present

## 2023-05-16 DIAGNOSIS — N2581 Secondary hyperparathyroidism of renal origin: Secondary | ICD-10-CM | POA: Diagnosis not present

## 2023-05-16 DIAGNOSIS — N186 End stage renal disease: Secondary | ICD-10-CM | POA: Diagnosis not present

## 2023-05-18 DIAGNOSIS — N2581 Secondary hyperparathyroidism of renal origin: Secondary | ICD-10-CM | POA: Diagnosis not present

## 2023-05-18 DIAGNOSIS — N186 End stage renal disease: Secondary | ICD-10-CM | POA: Diagnosis not present

## 2023-05-18 DIAGNOSIS — Z992 Dependence on renal dialysis: Secondary | ICD-10-CM | POA: Diagnosis not present

## 2023-05-20 DIAGNOSIS — N186 End stage renal disease: Secondary | ICD-10-CM | POA: Diagnosis not present

## 2023-05-20 DIAGNOSIS — Z992 Dependence on renal dialysis: Secondary | ICD-10-CM | POA: Diagnosis not present

## 2023-05-20 DIAGNOSIS — N2581 Secondary hyperparathyroidism of renal origin: Secondary | ICD-10-CM | POA: Diagnosis not present

## 2023-05-23 DIAGNOSIS — N2581 Secondary hyperparathyroidism of renal origin: Secondary | ICD-10-CM | POA: Diagnosis not present

## 2023-05-23 DIAGNOSIS — Z992 Dependence on renal dialysis: Secondary | ICD-10-CM | POA: Diagnosis not present

## 2023-05-23 DIAGNOSIS — N186 End stage renal disease: Secondary | ICD-10-CM | POA: Diagnosis not present

## 2023-05-24 DIAGNOSIS — D6869 Other thrombophilia: Secondary | ICD-10-CM | POA: Diagnosis not present

## 2023-05-24 DIAGNOSIS — E785 Hyperlipidemia, unspecified: Secondary | ICD-10-CM | POA: Diagnosis not present

## 2023-05-24 DIAGNOSIS — K219 Gastro-esophageal reflux disease without esophagitis: Secondary | ICD-10-CM | POA: Diagnosis not present

## 2023-05-24 DIAGNOSIS — N186 End stage renal disease: Secondary | ICD-10-CM | POA: Diagnosis not present

## 2023-05-24 DIAGNOSIS — N2581 Secondary hyperparathyroidism of renal origin: Secondary | ICD-10-CM | POA: Diagnosis not present

## 2023-05-24 DIAGNOSIS — I4891 Unspecified atrial fibrillation: Secondary | ICD-10-CM | POA: Diagnosis not present

## 2023-05-24 DIAGNOSIS — I77819 Aortic ectasia, unspecified site: Secondary | ICD-10-CM | POA: Diagnosis not present

## 2023-05-24 DIAGNOSIS — G8191 Hemiplegia, unspecified affecting right dominant side: Secondary | ICD-10-CM | POA: Diagnosis not present

## 2023-05-24 DIAGNOSIS — M1990 Unspecified osteoarthritis, unspecified site: Secondary | ICD-10-CM | POA: Diagnosis not present

## 2023-05-25 DIAGNOSIS — N186 End stage renal disease: Secondary | ICD-10-CM | POA: Diagnosis not present

## 2023-05-25 DIAGNOSIS — Z992 Dependence on renal dialysis: Secondary | ICD-10-CM | POA: Diagnosis not present

## 2023-05-25 DIAGNOSIS — N2581 Secondary hyperparathyroidism of renal origin: Secondary | ICD-10-CM | POA: Diagnosis not present

## 2023-05-27 DIAGNOSIS — N186 End stage renal disease: Secondary | ICD-10-CM | POA: Diagnosis not present

## 2023-05-27 DIAGNOSIS — N2581 Secondary hyperparathyroidism of renal origin: Secondary | ICD-10-CM | POA: Diagnosis not present

## 2023-05-27 DIAGNOSIS — Z992 Dependence on renal dialysis: Secondary | ICD-10-CM | POA: Diagnosis not present

## 2023-05-29 DIAGNOSIS — N170 Acute kidney failure with tubular necrosis: Secondary | ICD-10-CM | POA: Diagnosis not present

## 2023-05-29 DIAGNOSIS — Z992 Dependence on renal dialysis: Secondary | ICD-10-CM | POA: Diagnosis not present

## 2023-05-29 DIAGNOSIS — N186 End stage renal disease: Secondary | ICD-10-CM | POA: Diagnosis not present

## 2023-05-30 DIAGNOSIS — N2581 Secondary hyperparathyroidism of renal origin: Secondary | ICD-10-CM | POA: Diagnosis not present

## 2023-05-30 DIAGNOSIS — Z992 Dependence on renal dialysis: Secondary | ICD-10-CM | POA: Diagnosis not present

## 2023-05-30 DIAGNOSIS — N186 End stage renal disease: Secondary | ICD-10-CM | POA: Diagnosis not present

## 2023-06-01 DIAGNOSIS — Z992 Dependence on renal dialysis: Secondary | ICD-10-CM | POA: Diagnosis not present

## 2023-06-01 DIAGNOSIS — N2581 Secondary hyperparathyroidism of renal origin: Secondary | ICD-10-CM | POA: Diagnosis not present

## 2023-06-01 DIAGNOSIS — N186 End stage renal disease: Secondary | ICD-10-CM | POA: Diagnosis not present

## 2023-06-02 ENCOUNTER — Other Ambulatory Visit: Payer: Self-pay

## 2023-06-03 DIAGNOSIS — N2581 Secondary hyperparathyroidism of renal origin: Secondary | ICD-10-CM | POA: Diagnosis not present

## 2023-06-03 DIAGNOSIS — Z992 Dependence on renal dialysis: Secondary | ICD-10-CM | POA: Diagnosis not present

## 2023-06-03 DIAGNOSIS — N186 End stage renal disease: Secondary | ICD-10-CM | POA: Diagnosis not present

## 2023-06-06 DIAGNOSIS — N2581 Secondary hyperparathyroidism of renal origin: Secondary | ICD-10-CM | POA: Diagnosis not present

## 2023-06-06 DIAGNOSIS — N186 End stage renal disease: Secondary | ICD-10-CM | POA: Diagnosis not present

## 2023-06-06 DIAGNOSIS — Z992 Dependence on renal dialysis: Secondary | ICD-10-CM | POA: Diagnosis not present

## 2023-06-07 ENCOUNTER — Other Ambulatory Visit: Payer: Self-pay

## 2023-06-08 DIAGNOSIS — Z992 Dependence on renal dialysis: Secondary | ICD-10-CM | POA: Diagnosis not present

## 2023-06-08 DIAGNOSIS — N186 End stage renal disease: Secondary | ICD-10-CM | POA: Diagnosis not present

## 2023-06-08 DIAGNOSIS — N2581 Secondary hyperparathyroidism of renal origin: Secondary | ICD-10-CM | POA: Diagnosis not present

## 2023-06-10 DIAGNOSIS — N186 End stage renal disease: Secondary | ICD-10-CM | POA: Diagnosis not present

## 2023-06-10 DIAGNOSIS — N2581 Secondary hyperparathyroidism of renal origin: Secondary | ICD-10-CM | POA: Diagnosis not present

## 2023-06-10 DIAGNOSIS — Z992 Dependence on renal dialysis: Secondary | ICD-10-CM | POA: Diagnosis not present

## 2023-06-13 DIAGNOSIS — Z992 Dependence on renal dialysis: Secondary | ICD-10-CM | POA: Diagnosis not present

## 2023-06-13 DIAGNOSIS — N186 End stage renal disease: Secondary | ICD-10-CM | POA: Diagnosis not present

## 2023-06-13 DIAGNOSIS — N2581 Secondary hyperparathyroidism of renal origin: Secondary | ICD-10-CM | POA: Diagnosis not present

## 2023-06-15 DIAGNOSIS — Z992 Dependence on renal dialysis: Secondary | ICD-10-CM | POA: Diagnosis not present

## 2023-06-15 DIAGNOSIS — N186 End stage renal disease: Secondary | ICD-10-CM | POA: Diagnosis not present

## 2023-06-15 DIAGNOSIS — N2581 Secondary hyperparathyroidism of renal origin: Secondary | ICD-10-CM | POA: Diagnosis not present

## 2023-06-17 DIAGNOSIS — N186 End stage renal disease: Secondary | ICD-10-CM | POA: Diagnosis not present

## 2023-06-17 DIAGNOSIS — N2581 Secondary hyperparathyroidism of renal origin: Secondary | ICD-10-CM | POA: Diagnosis not present

## 2023-06-17 DIAGNOSIS — Z992 Dependence on renal dialysis: Secondary | ICD-10-CM | POA: Diagnosis not present

## 2023-06-20 DIAGNOSIS — Z992 Dependence on renal dialysis: Secondary | ICD-10-CM | POA: Diagnosis not present

## 2023-06-20 DIAGNOSIS — N186 End stage renal disease: Secondary | ICD-10-CM | POA: Diagnosis not present

## 2023-06-20 DIAGNOSIS — N2581 Secondary hyperparathyroidism of renal origin: Secondary | ICD-10-CM | POA: Diagnosis not present

## 2023-06-22 DIAGNOSIS — Z992 Dependence on renal dialysis: Secondary | ICD-10-CM | POA: Diagnosis not present

## 2023-06-22 DIAGNOSIS — N186 End stage renal disease: Secondary | ICD-10-CM | POA: Diagnosis not present

## 2023-06-22 DIAGNOSIS — N2581 Secondary hyperparathyroidism of renal origin: Secondary | ICD-10-CM | POA: Diagnosis not present

## 2023-06-24 DIAGNOSIS — N2581 Secondary hyperparathyroidism of renal origin: Secondary | ICD-10-CM | POA: Diagnosis not present

## 2023-06-24 DIAGNOSIS — N186 End stage renal disease: Secondary | ICD-10-CM | POA: Diagnosis not present

## 2023-06-24 DIAGNOSIS — Z992 Dependence on renal dialysis: Secondary | ICD-10-CM | POA: Diagnosis not present

## 2023-06-27 DIAGNOSIS — Z992 Dependence on renal dialysis: Secondary | ICD-10-CM | POA: Diagnosis not present

## 2023-06-27 DIAGNOSIS — N186 End stage renal disease: Secondary | ICD-10-CM | POA: Diagnosis not present

## 2023-06-27 DIAGNOSIS — N2581 Secondary hyperparathyroidism of renal origin: Secondary | ICD-10-CM | POA: Diagnosis not present

## 2023-06-28 DIAGNOSIS — N170 Acute kidney failure with tubular necrosis: Secondary | ICD-10-CM | POA: Diagnosis not present

## 2023-06-28 DIAGNOSIS — N186 End stage renal disease: Secondary | ICD-10-CM | POA: Diagnosis not present

## 2023-06-28 DIAGNOSIS — Z992 Dependence on renal dialysis: Secondary | ICD-10-CM | POA: Diagnosis not present

## 2023-06-29 DIAGNOSIS — N2581 Secondary hyperparathyroidism of renal origin: Secondary | ICD-10-CM | POA: Diagnosis not present

## 2023-06-29 DIAGNOSIS — Z992 Dependence on renal dialysis: Secondary | ICD-10-CM | POA: Diagnosis not present

## 2023-06-29 DIAGNOSIS — N186 End stage renal disease: Secondary | ICD-10-CM | POA: Diagnosis not present

## 2023-07-01 DIAGNOSIS — N186 End stage renal disease: Secondary | ICD-10-CM | POA: Diagnosis not present

## 2023-07-01 DIAGNOSIS — N2581 Secondary hyperparathyroidism of renal origin: Secondary | ICD-10-CM | POA: Diagnosis not present

## 2023-07-01 DIAGNOSIS — Z992 Dependence on renal dialysis: Secondary | ICD-10-CM | POA: Diagnosis not present

## 2023-07-04 DIAGNOSIS — N186 End stage renal disease: Secondary | ICD-10-CM | POA: Diagnosis not present

## 2023-07-04 DIAGNOSIS — N2581 Secondary hyperparathyroidism of renal origin: Secondary | ICD-10-CM | POA: Diagnosis not present

## 2023-07-04 DIAGNOSIS — Z992 Dependence on renal dialysis: Secondary | ICD-10-CM | POA: Diagnosis not present

## 2023-07-06 DIAGNOSIS — N186 End stage renal disease: Secondary | ICD-10-CM | POA: Diagnosis not present

## 2023-07-06 DIAGNOSIS — N2581 Secondary hyperparathyroidism of renal origin: Secondary | ICD-10-CM | POA: Diagnosis not present

## 2023-07-06 DIAGNOSIS — Z992 Dependence on renal dialysis: Secondary | ICD-10-CM | POA: Diagnosis not present

## 2023-07-07 ENCOUNTER — Ambulatory Visit: Payer: Medicare PPO | Admitting: Podiatry

## 2023-07-08 DIAGNOSIS — N2581 Secondary hyperparathyroidism of renal origin: Secondary | ICD-10-CM | POA: Diagnosis not present

## 2023-07-08 DIAGNOSIS — N186 End stage renal disease: Secondary | ICD-10-CM | POA: Diagnosis not present

## 2023-07-08 DIAGNOSIS — Z992 Dependence on renal dialysis: Secondary | ICD-10-CM | POA: Diagnosis not present

## 2023-07-11 DIAGNOSIS — Z992 Dependence on renal dialysis: Secondary | ICD-10-CM | POA: Diagnosis not present

## 2023-07-11 DIAGNOSIS — N2581 Secondary hyperparathyroidism of renal origin: Secondary | ICD-10-CM | POA: Diagnosis not present

## 2023-07-11 DIAGNOSIS — N186 End stage renal disease: Secondary | ICD-10-CM | POA: Diagnosis not present

## 2023-07-13 DIAGNOSIS — N2581 Secondary hyperparathyroidism of renal origin: Secondary | ICD-10-CM | POA: Diagnosis not present

## 2023-07-13 DIAGNOSIS — Z992 Dependence on renal dialysis: Secondary | ICD-10-CM | POA: Diagnosis not present

## 2023-07-13 DIAGNOSIS — N186 End stage renal disease: Secondary | ICD-10-CM | POA: Diagnosis not present

## 2023-07-14 ENCOUNTER — Encounter: Payer: Self-pay | Admitting: Cardiology

## 2023-07-14 ENCOUNTER — Ambulatory Visit: Attending: Cardiology | Admitting: Cardiology

## 2023-07-14 VITALS — BP 102/76 | HR 108 | Ht 77.0 in | Wt 228.0 lb

## 2023-07-14 DIAGNOSIS — D6869 Other thrombophilia: Secondary | ICD-10-CM | POA: Diagnosis not present

## 2023-07-14 DIAGNOSIS — I484 Atypical atrial flutter: Secondary | ICD-10-CM | POA: Diagnosis not present

## 2023-07-14 DIAGNOSIS — I4821 Permanent atrial fibrillation: Secondary | ICD-10-CM | POA: Diagnosis not present

## 2023-07-14 DIAGNOSIS — I700 Atherosclerosis of aorta: Secondary | ICD-10-CM

## 2023-07-14 DIAGNOSIS — I7121 Aneurysm of the ascending aorta, without rupture: Secondary | ICD-10-CM | POA: Diagnosis not present

## 2023-07-14 MED ORDER — ATORVASTATIN CALCIUM 10 MG PO TABS: 10.0000 mg | ORAL_TABLET | Freq: Every day | ORAL | 1 refills | Status: DC

## 2023-07-14 NOTE — Progress Notes (Signed)
 Cardiology Office Note:  .   Date:  07/14/2023  ID:  SUHEYB VERHAGEN, DOB 1940/06/23, MRN [id number] PCP: Benedetta Bradley, MD  [facility] Providers Cardiologist:  Knox Perl, MD   History of Present Illness: .   Austin Wheeler is a 83 y.o. 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  There is mention about biventricular heart failure by echocardiogram in 2022 but patient has normal LVEF by echocardiogram in 04/24/2021.    Discussed the use of AI scribe software for clinical note transcription with the patient, who gave verbal consent to proceed.  History of Present Illness Austin Wheeler is an 83 year old male with atrial fibrillation who presents for evaluation of bleeding issues related to anticoagulation therapy. He was referred by Dr. Charlene Conners for evaluation of bleeding issues related to anticoagulation therapy.  He experiences prolonged bleeding from his fistula site during dialysis, especially when the second needle is removed. This bleeding is longer than expected and may be related to his anticoagulation therapy with Eliquis . He takes Eliquis  twice daily, approximately every twelve hours, and avoids doses before dialysis sessions.  Atrial fibrillation was first documented in February 2023 during a hospital admission. Since then, he has been on Eliquis . He has end-stage renal disease and is on dialysis, with minimal urine output. He experiences difficulty sitting up if excess fluid is removed during dialysis but manages if fluid removal is kept within his tolerance.  He has no history of stroke or heart attack.   Labs   Lab Results  Component Value Date   NA 141 02/16/2022   K 4.3 02/16/2022   CO2 25 02/16/2022   GLUCOSE 157 (H) 02/16/2022   BUN 46 (H) 02/16/2022   CREATININE  7.63 (H) 02/16/2022   CALCIUM  9.6 02/16/2022   EGFR 90 05/13/2020   GFRNONAA 7 (L) 02/16/2022      Latest Ref Rng & Units 02/16/2022    7:50 PM 12/13/2021    6:07 AM 10/15/2021    8:48 AM  BMP  Glucose 70 - 99 mg/dL 409  86  85   BUN 8 - 23 mg/dL 46  28  17   Creatinine 0.61 - 1.24 mg/dL 8.11  9.14  7.82   Sodium 135 - 145 mmol/L 141  140  134   Potassium 3.5 - 5.1 mmol/L 4.3  3.7  3.9   Chloride 98 - 111 mmol/L 103  100  95   CO2 22 - 32 mmol/L 25     Calcium  8.9 - 10.3 mg/dL 9.6         Latest Ref Rng & Units 02/16/2022    7:50 PM 12/13/2021    6:07 AM 10/15/2021    8:48 AM  CBC  WBC 4.0 - 10.5 K/uL 5.6     Hemoglobin 13.0 - 17.0 g/dL 95.6  21.3  08.6   Hematocrit 39.0 - 52.0 % 37.8  42.0  35.0   Platelets 150 - 400 K/uL 152      Lab Results  Component Value Date   HGBA1C 4.8 04/23/2021     ROS  Review of Systems  Cardiovascular:  Negative for chest pain, dyspnea on exertion and leg swelling.    Physical Exam:   VS:  BP 102/76 (BP Location: Left Arm, Patient Position: Sitting, Cuff Size: Normal)   Pulse (!) 108   Ht  6\' 5"  (1.956 m)   Wt 228 lb (103.4 kg)   SpO2 97%   BMI 27.04 kg/m    Wt Readings from Last 3 Encounters:  07/14/23 228 lb (103.4 kg)  02/01/23 213 lb 13.5 oz (97 kg)  12/21/22 213 lb (96.6 kg)    Physical Exam Neck:     Vascular: No carotid bruit or JVD.  Cardiovascular:     Rate and Rhythm: Normal rate. Rhythm irregular.     Pulses: Intact distal pulses.          Dorsalis pedis pulses are 1+ on the right side and 1+ on the left side.       Posterior tibial pulses are 1+ on the right side and 1+ on the left side.     Heart sounds: No murmur heard.    Comments: Right brachial AV fistula for dialysis present Pulmonary:     Effort: Pulmonary effort is normal.     Breath sounds: Normal breath sounds.  Abdominal:     General: Bowel sounds are normal.     Palpations: Abdomen is soft.  Musculoskeletal:     Right lower leg: No edema.      Left lower leg: No edema.  Skin:    Capillary Refill: Capillary refill takes less than 2 seconds.    Studies Reviewed: Aaron Aas    ECHOCARDIOGRAM COMPLETE 04/24/2021 1. Left ventricular ejection fraction, by estimation, is 55 to 60%. The left ventricle has normal function. The left ventricle has no regional wall motion abnormalities. The left ventricular internal cavity size was moderately dilated. Left ventricular diastolic parameters are indeterminate. There is the interventricular septum is flattened in systole and diastole, consistent with right ventricular pressure and volume overload. 2. Right ventricular systolic function is moderately reduced. The right ventricular size is severely enlarged. There is moderately elevated pulmonary artery systolic pressure. 3. Left atrial size was mildly dilated. 4. Right atrial size was severely dilated. 5. Moderate pleural effusion in the left lateral region. 6. The mitral valve is normal in structure. Mild mitral valve regurgitation. No evidence of mitral stenosis. 7. Tricuspid valve regurgitation is moderate to severe. 8. The aortic valve is tricuspid. Aortic valve regurgitation is trivial. Aortic valve sclerosis is present, with no evidence of aortic valve stenosis. 9. Aortic dilatation noted. There is mild dilatation of the aortic root, measuring 43 mm. There is mild dilatation of the ascending aorta, measuring 43 mm. There is mild dilatation of the aortic arch, measuring 43 mm. 10. The inferior vena cava is dilated in size with <50% respiratory variability, suggesting right atrial pressure of 15 mmHg.  EKG:    EKG Interpretation Date/Time:  Friday Jul 14 2023 11:33:17 EDT Ventricular Rate:  108 PR Interval:    QRS Duration:  96 QT Interval:  348 QTC Calculation: 466 R Axis:   -20  Text Interpretation: EKG 07/14/2023: Atypical atrial flutter with variable ventricular response, ventricular rate 108 bpm, incomplete right bundle branch block.  Poor R  wave progression, cannot exclude anteroseptal infarct old.  Compared to 04/23/2021, atrial fibrillation has been replaced by atrial flutter. Confirmed by Artavius Stearns, Jagadeesh ([id number]) on 07/14/2023 12:16:03 PM    Medications and allergies    Allergies  Allergen Reactions   Guaifenesin Swelling and Rash    Hand and feet swelling from Robitussin 50 years ago   Robitussin Dm Max Day-Night Other (See Comments)    Other reaction(s): feet and leg swelling 50 years ago   Lexapro [Escitalopram] Nausea And Vomiting  Reported by [facility] - pt does not recall     Current Outpatient Medications:    acetaminophen  (TYLENOL ) 500 MG tablet, Take 500 mg by mouth every 6 (six) hours as needed (pain.)., Disp: , Rfl:    apixaban  (ELIQUIS ) 2.5 MG TABS tablet, Take 1 tablet (2.5 mg total) by mouth 2 (two) times daily., Disp: 60 tablet, Rfl:    atorvastatin (LIPITOR) 10 MG tablet, Take 1 tablet (10 mg total) by mouth daily., Disp: 90 tablet, Rfl: 1   atropine  1 % ophthalmic solution, Place 1 drop into the left eye at bedtime., Disp: , Rfl:    brimonidine  (ALPHAGAN ) 0.2 % ophthalmic solution, Place 1 drop into the left eye 3 (three) times daily., Disp: , Rfl:    clobetasol cream (TEMOVATE) 0.05 %, Apply 1 Application topically 2 (two) times daily., Disp: , Rfl:    dorzolamide -timolol  (COSOPT ) 22.3-6.8 MG/ML ophthalmic solution, Place 1 drop into the left eye 2 (two) times daily., Disp: , Rfl:    dutasteride  (AVODART ) 0.5 MG capsule, Take 1 capsule (0.5 mg total) by mouth every Monday, Wednesday, and Friday., Disp: 13 capsule, Rfl: 11   latanoprost  (XALATAN ) 0.005 % ophthalmic solution, Place 1 drop into the left eye at bedtime., Disp: , Rfl:    lidocaine -prilocaine  (EMLA ) cream, Apply 1 Application topically daily as needed (prior to dialysis)., Disp: , Rfl:    lidocaine -prilocaine  (EMLA ) cream, Apply a thick layer externally to intact skin and cover with an occlusive dressing about 1 hour prior to dialysis  and cover with occlusive dressing., Disp: 30 g, Rfl: 3   Nutritional Supplements (FEEDING SUPPLEMENT, NEPRO CARB STEADY,) LIQD, Take 237 mLs by mouth 3 (three) times a week., Disp: , Rfl:    triamcinolone  ointment (KENALOG ) 0.1 %, Apply 1 Application topically 2 (two) times daily as needed (wound care)., Disp: , Rfl:    Meds ordered this encounter  Medications   atorvastatin (LIPITOR) 10 MG tablet    Sig: Take 1 tablet (10 mg total) by mouth daily.    Dispense:  90 tablet    Refill:  1    Refills to Charlene Conners, MD     There are no discontinued medications.   ASSESSMENT AND PLAN: .      ICD-10-CM   1. Permanent atrial fibrillation (HCC)  I48.21 EKG 12-Lead    2. Atypical atrial flutter (HCC)  I48.4     3. Aortic atherosclerosis (HCC)  I70.0 atorvastatin (LIPITOR) 10 MG tablet    4. Aneurysm of ascending aorta without rupture (HCC)  I71.21     5. Secondary hypercoagulable state (HCC)  D68.69      Click Here to Calculate/Change CHADS2VASc Score The patient's CHADS2-VASc score is 3, indicating a 3.2% annual risk of stroke.  Therefore, anticoagulation is recommended.   CHF History: No HTN History: No Diabetes History: No Stroke History: No Vascular Disease History: Yes   Assessment and Plan Assessment & Plan Permanent atrial fibrillation   He has been in atrial fibrillation since at least February 2023 and remains asymptomatic. The current EKG shows atypical atrial flutter, which does not alter management. He is on Eliquis  to prevent stroke, a risk associated with atrial fibrillation. Reducing the Eliquis  dose would increase stroke risk. Continue Eliquis  2.5 mg twice daily. Order an echocardiogram to assess heart function and measure aortic dilation.  Ascending aortic aneurysm   There is slight dilation of the ascending aorta at 4.3 cm, noted on the February 2023 echocardiogram, with normal size being  3.6 cm. Re-evaluation with an echocardiogram is planned to assess  changes. Order an echocardiogram to assess aortic dilation.  Aortic atherosclerosis   Mild plaque buildup in the aorta was observed on a previous CT scan. Given his age, prediabetes, and hypertension, this is expected. There are no symptoms or acute concerns. Start atorvastatin 10 mg once daily to reduce future stroke and heart attack risk.  Goal LDL <100 or less.  I do not have his recent labs with regard to lipids.  Will request his PCP to take over.  End-stage renal disease   He is in end-stage renal disease with minimal urine output and is undergoing dialysis. Fluid management issues arise, particularly when fluid removal exceeds dry weight. No antihypertensives are used due to hypotension during dialysis.  Blindness due to glaucoma and macular degeneration   Blindness is attributed to glaucoma and macular degeneration. There is no diabetes present. Vision loss occurred during a previous hospital stay, possibly due to complications with eye medications.  I will see him back in 6 months and if he remains stable on a as needed basis and is aortic root dilatation/ascending aortic aneurysm can be monitored via annual or biannual echocardiogram if there is good correlation with CT scan. Signed,  Knox Perl, MD, [facility] 07/14/2023, 12:37 PM [facility] [address] [place], [place] [address] Phone: [phone]. Fax:  [phone]

## 2023-07-14 NOTE — Patient Instructions (Signed)
 Medication Instructions:  Your physician has recommended you make the following change in your medication:   1) START atorvastatin 10 mg daily  *If you need a refill on your cardiac medications before your next appointment, please call your pharmacy*  Testing/Procedures: Your physician has requested that you have an echocardiogram. Echocardiography is a painless test that uses sound waves to create images of your heart. It provides your doctor with information about the size and shape of your heart and how well your heart's chambers and valves are working. This procedure takes approximately one hour. There are no restrictions for this procedure. Please do NOT wear cologne, perfume, aftershave, or lotions (deodorant is allowed). Please arrive 15 minutes prior to your appointment time.  Please note: We ask at that you not bring children with you during ultrasound (echo/ vascular) testing. Due to room size and safety concerns, children are not allowed in the ultrasound rooms during exams. Our front office staff cannot provide observation of children in our lobby area while testing is being conducted. An adult accompanying a patient to their appointment will only be allowed in the ultrasound room at the discretion of the ultrasound technician under special circumstances. We apologize for any inconvenience.  Follow-Up: At [facility], you and your health needs are our priority.  As part of our continuing mission to provide you with exceptional heart care, our providers are all part of one team.  This team includes your primary Cardiologist (physician) and Advanced Practice Providers or APPs (Physician Assistants and Nurse Practitioners) who all work together to provide you with the care you need, when you need it.  Your next appointment:   6 month(s)  The format for your next appointment:   In Person  Provider:   Knox Perl, MD{  We recommend signing up for the patient portal called  "MyChart".  Sign up information is provided on this After Visit Summary.  MyChart is used to connect with patients for Virtual Visits (Telemedicine).  Patients are able to view lab/test results, encounter notes, upcoming appointments, etc.  Non-urgent messages can be sent to your provider as well.   To learn more about what you can do with MyChart, go to [email or web address].

## 2023-07-15 DIAGNOSIS — N186 End stage renal disease: Secondary | ICD-10-CM | POA: Diagnosis not present

## 2023-07-15 DIAGNOSIS — N2581 Secondary hyperparathyroidism of renal origin: Secondary | ICD-10-CM | POA: Diagnosis not present

## 2023-07-15 DIAGNOSIS — Z992 Dependence on renal dialysis: Secondary | ICD-10-CM | POA: Diagnosis not present

## 2023-07-17 DIAGNOSIS — H401123 Primary open-angle glaucoma, left eye, severe stage: Secondary | ICD-10-CM | POA: Diagnosis not present

## 2023-07-17 DIAGNOSIS — H4051X3 Glaucoma secondary to other eye disorders, right eye, severe stage: Secondary | ICD-10-CM | POA: Diagnosis not present

## 2023-07-18 DIAGNOSIS — Z992 Dependence on renal dialysis: Secondary | ICD-10-CM | POA: Diagnosis not present

## 2023-07-18 DIAGNOSIS — N2581 Secondary hyperparathyroidism of renal origin: Secondary | ICD-10-CM | POA: Diagnosis not present

## 2023-07-18 DIAGNOSIS — N186 End stage renal disease: Secondary | ICD-10-CM | POA: Diagnosis not present

## 2023-07-19 ENCOUNTER — Ambulatory Visit: Admitting: Podiatry

## 2023-07-19 ENCOUNTER — Encounter: Payer: Self-pay | Admitting: Podiatry

## 2023-07-19 DIAGNOSIS — M79674 Pain in right toe(s): Secondary | ICD-10-CM | POA: Diagnosis not present

## 2023-07-19 DIAGNOSIS — M79675 Pain in left toe(s): Secondary | ICD-10-CM

## 2023-07-19 DIAGNOSIS — B351 Tinea unguium: Secondary | ICD-10-CM

## 2023-07-19 DIAGNOSIS — E1169 Type 2 diabetes mellitus with other specified complication: Secondary | ICD-10-CM | POA: Diagnosis not present

## 2023-07-19 NOTE — Progress Notes (Signed)
 This patient presents  to my office for at risk foot care.  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  This patient is taking eliquis .This patient is unable to cut nails himself since the patient cannot reach his nails.These nails are painful walking and wearing shoes. He presents to the office with his wife. This patient presents for at risk foot care today.  General Appearance  Alert, conversant and in no acute stress.  Vascular  Dorsalis pedis and posterior tibial  pulses are  not palpable due to swelling  bilaterally.  Capillary return is within normal limits  bilaterally. Temperature is within normal limits  bilaterally.  Neurologic  Senn-Weinstein monofilament wire test diminished  bilaterally. Muscle power within normal limits bilaterally.  Nails Thick disfigured discolored nails with subungual debris  from hallux to fifth toes bilaterally. No evidence of bacterial infection or drainage bilaterally.  Orthopedic  No limitations of motion  feet .  No crepitus or effusions noted.  No bony pathology or digital deformities noted.  Skin  normotropic skin with no porokeratosis noted bilaterally.  No signs of infections or ulcers noted.     Onychomycosis  Pain in right toes  Pain in left toes  Consent was obtained for treatment procedures.   Mechanical debridement of nails 1-5  bilaterally performed with a nail nipper.  Filed with dremel without incident.    Return office visit  4    months                  Told patient to return for periodic foot care and evaluation due to potential at risk complications.   Ruffin Cotton DPM

## 2023-07-20 DIAGNOSIS — N186 End stage renal disease: Secondary | ICD-10-CM | POA: Diagnosis not present

## 2023-07-20 DIAGNOSIS — N2581 Secondary hyperparathyroidism of renal origin: Secondary | ICD-10-CM | POA: Diagnosis not present

## 2023-07-20 DIAGNOSIS — Z992 Dependence on renal dialysis: Secondary | ICD-10-CM | POA: Diagnosis not present

## 2023-07-22 DIAGNOSIS — Z992 Dependence on renal dialysis: Secondary | ICD-10-CM | POA: Diagnosis not present

## 2023-07-22 DIAGNOSIS — N186 End stage renal disease: Secondary | ICD-10-CM | POA: Diagnosis not present

## 2023-07-22 DIAGNOSIS — N2581 Secondary hyperparathyroidism of renal origin: Secondary | ICD-10-CM | POA: Diagnosis not present

## 2023-07-25 DIAGNOSIS — N2581 Secondary hyperparathyroidism of renal origin: Secondary | ICD-10-CM | POA: Diagnosis not present

## 2023-07-25 DIAGNOSIS — Z992 Dependence on renal dialysis: Secondary | ICD-10-CM | POA: Diagnosis not present

## 2023-07-25 DIAGNOSIS — N186 End stage renal disease: Secondary | ICD-10-CM | POA: Diagnosis not present

## 2023-07-27 DIAGNOSIS — Z992 Dependence on renal dialysis: Secondary | ICD-10-CM | POA: Diagnosis not present

## 2023-07-27 DIAGNOSIS — N186 End stage renal disease: Secondary | ICD-10-CM | POA: Diagnosis not present

## 2023-07-27 DIAGNOSIS — N2581 Secondary hyperparathyroidism of renal origin: Secondary | ICD-10-CM | POA: Diagnosis not present

## 2023-07-29 DIAGNOSIS — N2581 Secondary hyperparathyroidism of renal origin: Secondary | ICD-10-CM | POA: Diagnosis not present

## 2023-07-29 DIAGNOSIS — Z992 Dependence on renal dialysis: Secondary | ICD-10-CM | POA: Diagnosis not present

## 2023-07-29 DIAGNOSIS — N186 End stage renal disease: Secondary | ICD-10-CM | POA: Diagnosis not present

## 2023-07-29 DIAGNOSIS — N170 Acute kidney failure with tubular necrosis: Secondary | ICD-10-CM | POA: Diagnosis not present

## 2023-08-01 DIAGNOSIS — N186 End stage renal disease: Secondary | ICD-10-CM | POA: Diagnosis not present

## 2023-08-01 DIAGNOSIS — N2581 Secondary hyperparathyroidism of renal origin: Secondary | ICD-10-CM | POA: Diagnosis not present

## 2023-08-01 DIAGNOSIS — Z992 Dependence on renal dialysis: Secondary | ICD-10-CM | POA: Diagnosis not present

## 2023-08-03 DIAGNOSIS — N2581 Secondary hyperparathyroidism of renal origin: Secondary | ICD-10-CM | POA: Diagnosis not present

## 2023-08-03 DIAGNOSIS — N186 End stage renal disease: Secondary | ICD-10-CM | POA: Diagnosis not present

## 2023-08-03 DIAGNOSIS — Z992 Dependence on renal dialysis: Secondary | ICD-10-CM | POA: Diagnosis not present

## 2023-08-05 DIAGNOSIS — N2581 Secondary hyperparathyroidism of renal origin: Secondary | ICD-10-CM | POA: Diagnosis not present

## 2023-08-05 DIAGNOSIS — N186 End stage renal disease: Secondary | ICD-10-CM | POA: Diagnosis not present

## 2023-08-05 DIAGNOSIS — Z992 Dependence on renal dialysis: Secondary | ICD-10-CM | POA: Diagnosis not present

## 2023-08-07 ENCOUNTER — Other Ambulatory Visit: Payer: Self-pay

## 2023-08-08 ENCOUNTER — Other Ambulatory Visit: Payer: Self-pay

## 2023-08-08 DIAGNOSIS — N186 End stage renal disease: Secondary | ICD-10-CM | POA: Diagnosis not present

## 2023-08-08 DIAGNOSIS — Z992 Dependence on renal dialysis: Secondary | ICD-10-CM | POA: Diagnosis not present

## 2023-08-08 DIAGNOSIS — N2581 Secondary hyperparathyroidism of renal origin: Secondary | ICD-10-CM | POA: Diagnosis not present

## 2023-08-09 ENCOUNTER — Ambulatory Visit (HOSPITAL_COMMUNITY): Admission: RE | Admit: 2023-08-09 | Source: Home / Self Care | Admitting: Vascular Surgery

## 2023-08-10 DIAGNOSIS — Z992 Dependence on renal dialysis: Secondary | ICD-10-CM | POA: Diagnosis not present

## 2023-08-10 DIAGNOSIS — N2581 Secondary hyperparathyroidism of renal origin: Secondary | ICD-10-CM | POA: Diagnosis not present

## 2023-08-10 DIAGNOSIS — N186 End stage renal disease: Secondary | ICD-10-CM | POA: Diagnosis not present

## 2023-08-12 DIAGNOSIS — N186 End stage renal disease: Secondary | ICD-10-CM | POA: Diagnosis not present

## 2023-08-12 DIAGNOSIS — N2581 Secondary hyperparathyroidism of renal origin: Secondary | ICD-10-CM | POA: Diagnosis not present

## 2023-08-12 DIAGNOSIS — Z992 Dependence on renal dialysis: Secondary | ICD-10-CM | POA: Diagnosis not present

## 2023-08-15 DIAGNOSIS — N2581 Secondary hyperparathyroidism of renal origin: Secondary | ICD-10-CM | POA: Diagnosis not present

## 2023-08-15 DIAGNOSIS — N186 End stage renal disease: Secondary | ICD-10-CM | POA: Diagnosis not present

## 2023-08-15 DIAGNOSIS — Z992 Dependence on renal dialysis: Secondary | ICD-10-CM | POA: Diagnosis not present

## 2023-08-17 DIAGNOSIS — Z992 Dependence on renal dialysis: Secondary | ICD-10-CM | POA: Diagnosis not present

## 2023-08-17 DIAGNOSIS — N186 End stage renal disease: Secondary | ICD-10-CM | POA: Diagnosis not present

## 2023-08-17 DIAGNOSIS — N2581 Secondary hyperparathyroidism of renal origin: Secondary | ICD-10-CM | POA: Diagnosis not present

## 2023-08-18 ENCOUNTER — Encounter (HOSPITAL_COMMUNITY): Admission: RE | Payer: Self-pay | Source: Home / Self Care

## 2023-08-18 ENCOUNTER — Other Ambulatory Visit: Payer: Self-pay

## 2023-08-18 ENCOUNTER — Encounter (HOSPITAL_COMMUNITY)
Admission: RE | Disposition: A | Discharge status: Home / Self Care | Payer: Self-pay | Source: Home / Self Care | Attending: Vascular Surgery

## 2023-08-18 ENCOUNTER — Ambulatory Visit (HOSPITAL_BASED_OUTPATIENT_CLINIC_OR_DEPARTMENT_OTHER): Admitting: Cardiology

## 2023-08-18 ENCOUNTER — Ambulatory Visit (HOSPITAL_COMMUNITY)
Admission: RE | Admit: 2023-08-18 | Discharge: 2023-08-18 | Disposition: A | Discharge status: Home / Self Care | Attending: Vascular Surgery | Admitting: Vascular Surgery

## 2023-08-18 DIAGNOSIS — I132 Hypertensive heart and chronic kidney disease with heart failure and with stage 5 chronic kidney disease, or end stage renal disease: Secondary | ICD-10-CM | POA: Insufficient documentation

## 2023-08-18 DIAGNOSIS — I871 Compression of vein: Secondary | ICD-10-CM | POA: Insufficient documentation

## 2023-08-18 DIAGNOSIS — E1122 Type 2 diabetes mellitus with diabetic chronic kidney disease: Secondary | ICD-10-CM | POA: Diagnosis not present

## 2023-08-18 DIAGNOSIS — Y832 Surgical operation with anastomosis, bypass or graft as the cause of abnormal reaction of the patient, or of later complication, without mention of misadventure at the time of the procedure: Secondary | ICD-10-CM | POA: Diagnosis not present

## 2023-08-18 DIAGNOSIS — T82838A Hemorrhage of vascular prosthetic devices, implants and grafts, initial encounter: Secondary | ICD-10-CM

## 2023-08-18 DIAGNOSIS — T82858A Stenosis of vascular prosthetic devices, implants and grafts, initial encounter: Secondary | ICD-10-CM

## 2023-08-18 DIAGNOSIS — Z87891 Personal history of nicotine dependence: Secondary | ICD-10-CM | POA: Diagnosis not present

## 2023-08-18 DIAGNOSIS — Z992 Dependence on renal dialysis: Secondary | ICD-10-CM | POA: Diagnosis not present

## 2023-08-18 DIAGNOSIS — N186 End stage renal disease: Secondary | ICD-10-CM

## 2023-08-18 DIAGNOSIS — I509 Heart failure, unspecified: Secondary | ICD-10-CM | POA: Diagnosis not present

## 2023-08-18 HISTORY — PX: VENOUS ANGIOPLASTY: CATH118376

## 2023-08-18 HISTORY — PX: A/V SHUNT INTERVENTION: CATH118220

## 2023-08-18 SURGERY — A/V SHUNT INTERVENTION
Anesthesia: LOCAL

## 2023-08-18 SURGERY — A/V SHUNT INTERVENTION
Anesthesia: LOCAL | Site: Arm Upper | Laterality: Right

## 2023-08-18 MED ORDER — LIDOCAINE HCL (PF) 1 % IJ SOLN
INTRAMUSCULAR | Status: DC | PRN
  Administered 2023-08-18: 5 mL

## 2023-08-18 MED ORDER — IODIXANOL 320 MG/ML IV SOLN
INTRAVENOUS | Status: DC | PRN
  Administered 2023-08-18: 20 mL via INTRAVENOUS

## 2023-08-18 MED ORDER — HEPARIN (PORCINE) IN NACL 1000-0.9 UT/500ML-% IV SOLN
INTRAVENOUS | Status: DC | PRN
  Administered 2023-08-18: 500 mL

## 2023-08-18 SURGICAL SUPPLY — 9 items
BALLOON ATHLETIS 7.0X60X75 (BALLOONS) IMPLANT
BALLOON MUSTANG 10X80X75 (BALLOONS) IMPLANT
KIT ENCORE 26 ADVANTAGE (KITS) IMPLANT
KIT MICROPUNCTURE NIT STIFF (SHEATH) IMPLANT
SHEATH PINNACLE R/O II 6F 4CM (SHEATH) IMPLANT
SHEATH PROBE COVER 6X72 (BAG) IMPLANT
TRAY PV CATH (CUSTOM PROCEDURE TRAY) ×3 IMPLANT
TUBING CIL FLEX 10 FLL-RA (TUBING) IMPLANT
WIRE BENTSON .035X145CM (WIRE) IMPLANT

## 2023-08-18 NOTE — H&P (Signed)
 H&P     MRN #:  [id number]  History of Present Illness: This is a 83 y.o. male with end-stage renal disease that presents for evaluation of prolonged bleeding from his right basilic vein fistula.  Plan right arm fistulogram.  Ongoing bleeding for the last several weeks.  Has had prior intervention with [facility]  Past Medical History:  Diagnosis Date   Allergic rhinitis    Anemia    Atrial fibrillation (HCC)    Blind    walker and wheelchair   Blindness    BPH (benign prostatic hyperplasia)    Chronic kidney disease    Complication of anesthesia    Retention after eye surgery.  Patient's wife denies this dx as of 12/10/21.   Dependence on renal dialysis (HCC)    Diabetes mellitus without complication (HCC)    Type II, does not check levels at home.   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)    Hypertension    Impaired fasting glucose    Macular degeneration    Bilateral   Malignant tumor of prostate (HCC)    NVG (neovascular glaucoma) 2023   bilateral - followed by Dr Leanor Proper   Obesity    Paroxysmal atrial fibrillation (HCC)    Prostate cancer [facility])    Renal failure    dialysis T-TH-S   Vitamin D deficiency    Walker as ambulation aid    also uses wheelchair    Past Surgical History:  Procedure Laterality Date   A/V FISTULAGRAM N/A 02/01/2023   Procedure: A/V Fistulagram;  Surgeon: Patrick Boor, MD;  Location: [facility];  Service: Cardiovascular;  Laterality: N/A;   AV FISTULA PLACEMENT Right 05/13/2021   Procedure: CREATION OF RIGHT ARM BRACHIOCEPHALIC FISTULA;  Surgeon: Adine Hoof, MD;  Location: [facility] OR;  Service: Vascular;  Laterality: Right;   AV FISTULA PLACEMENT Right 10/15/2021   Procedure: RIGHT ARTERIOVENOUS (AV)  FISTULA CREATION VERSUS GRAFT;  Surgeon: Young Hensen, MD;  Location: [facility] OR;  Service: Vascular;  Laterality: Right;   BASCILIC VEIN TRANSPOSITION Right 12/13/2021   Procedure: RIGHT SECOND STAGE BASILIC VEIN TRANSPOSITION;  Surgeon: Young Hensen, MD;  Location: [facility] OR;  Service: Vascular;  Laterality: Right;   EYE SURGERY     Bilateral glaucoma and macular degeneration   FISTULA SUPERFICIALIZATION Right 07/16/2021   Procedure: RIGHT UPPER EXTREMITY FISTULA SUPERFICIALIZATION WITH BRANCH LIGATION;  Surgeon: Adine Hoof, MD;  Location: [facility] OR;  Service: Vascular;  Laterality: Right;  Block   IR FLUORO GUIDE CV LINE RIGHT  05/03/2021   IR PARACENTESIS  04/23/2021   IR US  GUIDE VASC ACCESS RIGHT  05/03/2021   PERIPHERAL VASCULAR BALLOON ANGIOPLASTY  02/01/2023   Procedure: PERIPHERAL VASCULAR BALLOON ANGIOPLASTY;  Surgeon: Patrick Boor, MD;  Location: [facility];  Service: Cardiovascular;;    Allergies  Allergen Reactions   Guaifenesin Swelling and Rash    Hand and feet swelling from Robitussin 50 years ago   Robitussin Dm Max Day-Night Other (See Comments)    Other reaction(s): feet and leg swelling 50 years ago   Lexapro [Escitalopram] Nausea And Vomiting    Reported by [facility] -  pt does not recall    Prior to Admission medications   Medication Sig Start Date End Date Taking? Authorizing Provider  acetaminophen  (TYLENOL ) 500 MG tablet Take 500 mg by mouth every 6 (six) hours as needed (pain.).    [provider]  apixaban  (ELIQUIS ) 2.5 MG TABS tablet Take 1 tablet (2.5 mg total) by mouth 2 (two) times daily. 05/14/21   Garnet Just, MD  atorvastatin  (LIPITOR) 10 MG tablet Take 1 tablet (10 mg total) by mouth daily. 07/14/23 10/12/23  Knox Perl, MD  atropine  1 % ophthalmic solution Place 1 drop into the left eye at bedtime. 11/11/19   [provider]  brimonidine  (ALPHAGAN ) 0.2 % ophthalmic solution Place 1 drop into the left eye  3 (three) times daily. 02/22/21   [provider]  clobetasol cream (TEMOVATE) 0.05 % Apply 1 Application topically 2 (two) times daily. 11/09/22   [provider]  dorzolamide -timolol  (COSOPT ) 22.3-6.8 MG/ML ophthalmic solution Place 1 drop into the left eye 2 (two) times daily. 04/11/12   [provider]  dutasteride  (AVODART ) 0.5 MG capsule Take 1 capsule (0.5 mg total) by mouth every Monday, Wednesday, and Friday. 12/21/22   Stoioff, Kizzie Perks, MD  latanoprost  (XALATAN ) 0.005 % ophthalmic solution Place 1 drop into the left eye at bedtime. 04/11/12   [provider]  lidocaine -prilocaine  (EMLA ) cream Apply 1 Application topically daily as needed (prior to dialysis).    [provider]  lidocaine -prilocaine  (EMLA ) cream Apply a thick layer externally to intact skin and cover with an occlusive dressing about 1 hour prior to dialysis and cover with occlusive dressing. 02/23/23     Nutritional Supplements (FEEDING SUPPLEMENT, NEPRO CARB STEADY,) LIQD Take 237 mLs by mouth 3 (three) times a week.    [provider]  triamcinolone  ointment (KENALOG ) 0.1 % Apply 1 Application topically 2 (two) times daily as needed (wound care).    [provider]    Social History   Socioeconomic History   Marital status: Married    Spouse name: Not on file   Number of children: Not on file   Years of education: Not on file   Highest education level: Not on file  Occupational History   Not on file  Tobacco Use   Smoking status: Former    Current packs/day: 0.00    Types: Cigarettes    Quit date: 2019    Years since quitting: 6.4    Passive exposure: Never   Smokeless tobacco: Never  Vaping Use   Vaping status: Never Used  Substance and Sexual Activity   Alcohol use: Not Currently   Drug use: Never   Sexual activity: Yes  Other Topics Concern   Not on file  Social History Narrative   Not on file   Social Drivers of Health   Financial  Resource Strain: Not on file  Food Insecurity: Not on file  Transportation Needs: No Transportation Needs (06/24/2021)   PRAPARE - Administrator, Civil Service (Medical): No    Lack of Transportation (Non-Medical): No  Physical Activity: Not on file  Stress: Not on file  Social Connections: Not on file  Intimate Partner Violence: Not on file     No family history on file.  ROS: [x]  Positive   [ ]  Negative   [ ]  All sytems reviewed and are negative  Cardiovascular: []  chest pain/pressure []  palpitations []  SOB lying flat []  DOE []  pain in legs while walking []  pain in  legs at rest []  pain in legs at night []  non-healing ulcers []  hx of DVT []  swelling in legs  Pulmonary: []  productive cough []  asthma/wheezing []  home O2  Neurologic: []  weakness in []  arms []  legs []  numbness in []  arms []  legs []  hx of CVA []  mini stroke [] difficulty speaking or slurred speech []  temporary loss of vision in one eye []  dizziness  Hematologic: []  hx of cancer []  bleeding problems []  problems with blood clotting easily  Endocrine:   []  diabetes []  thyroid disease  GI []  vomiting blood []  blood in stool  GU: []  CKD/renal failure []  HD--[]  M/W/F or []  T/T/S []  burning with urination []  blood in urine  Psychiatric: []  anxiety []  depression  Musculoskeletal: []  arthritis []  joint pain  Integumentary: []  rashes []  ulcers  Constitutional: []  fever []  chills   Physical Examination  Vitals:   08/18/23 0719  BP: 122/89  Pulse: 72  Resp: 14   There is no height or weight on file to calculate BMI.  General:  NAD Gait: Not observed HENT: WNL, normocephalic Pulmonary: normal non-labored breathing Cardiac: regular, without  Murmurs, rubs or gallops Abdomen:  soft, NT/ND Vascular Exam/Pulses: Right basilic vein fistula pulsatile  CBC    Component Value Date/Time   WBC 5.6 02/16/2022 1950   RBC 3.90 (L) 02/16/2022 1950   HGB 12.3 (L) 02/16/2022  1950   HCT 37.8 (L) 02/16/2022 1950   PLT 152 02/16/2022 1950   MCV 96.9 02/16/2022 1950   MCH 31.5 02/16/2022 1950   MCHC 32.5 02/16/2022 1950   RDW 14.6 02/16/2022 1950   LYMPHSABS 0.9 02/16/2022 1950   MONOABS 0.8 02/16/2022 1950   EOSABS 0.2 02/16/2022 1950   BASOSABS 0.0 02/16/2022 1950    BMET    Component Value Date/Time   NA 141 02/16/2022 1950   NA 144 05/13/2020 1051   K 4.3 02/16/2022 1950   CL 103 02/16/2022 1950   CO2 25 02/16/2022 1950   GLUCOSE 157 (H) 02/16/2022 1950   BUN 46 (H) 02/16/2022 1950   BUN 11 05/13/2020 1051   CREATININE 7.63 (H) 02/16/2022 1950   CALCIUM  9.6 02/16/2022 1950   CALCIUM  8.8 05/11/2021 0110   GFRNONAA 7 (L) 02/16/2022 1950   GFRAA  02/06/2008 1915    >60        The eGFR has been calculated using the MDRD equation. This calculation has not been validated in all clinical    COAGS: Lab Results  Component Value Date   INR 1.5 (H) 02/16/2022   INR 1.4 (H) 04/29/2021   INR 1.5 (H) 04/27/2021     Non-Invasive Vascular Imaging:       ASSESSMENT/PLAN: This is a 83 y.o. male  with end-stage renal disease that presents for evaluation of prolonged bleeding from his right basilic vein fistula.  Plan right arm fistulogram.  Ongoing bleeding for the last several weeks.  Has had prior intervention with [facility]  Discussed plan for right arm fistulogram with possible intervention including angioplasty and stent  Young Hensen, MD [facility] Office: [phone]  Young Hensen

## 2023-08-18 NOTE — Op Note (Signed)
    Patient name: Austin Wheeler MRN: [id number] DOB: 1940-11-29 Sex: male  08/18/2023 Pre-operative Diagnosis: Prolonged bleeding right arm basilic vein fistula Post-operative diagnosis:  Same Surgeon:  Young Hensen, MD Procedure Performed: 1.  Ultrasound-guided access right arm basilic vein fistula 2.  Right upper extremity fistulogram including central venogram 3.  Peripheral angioplasty right basilic vein fistula mid upper arm (7 mm x 60 mm Athletis) 4.  Central angioplasty right subclavian vein of AV fistula (10 mm x 80 mm Mustang)  Indications: 83 year old male with end-stage renal disease using a right brachiobasilic AV fistula.  He presents with prolonged bleeding and plan for fistulogram after risks and benefits discussed.    Findings:   Ultrasound-guided access right basilic vein fistula at the antecubitum.  Fistulogram showed 2 tandem 80% stenosis in the mid upper arm basilic vein that was treated with a 7 mm Athletis with no residual stenosis.  The right subclavian vein also had about a 60% stenosis that was treated with a 10 mm Mustang with good results and no residual stenosis.  The vein is small adjacent to the arterial anastomosis but there was an excellent thrill at completion.   Procedure:  The patient was identified in the holding area and taken to [facility] lab.  The patient was then placed supine on the table and prepped and draped in the usual sterile fashion.  A time out was called.  Ultrasound was used to access the right basilic vein fistula.  A microsheath was placed and we got a right upper extremity fistulogram including central venogram.  Pertinent findings are noted above.  Ultimately elected for intervention.  I used a Bentson wire and exchanged for a short 6 [place] sheath.  I crossed the stenosis in the right arm basilic vein into the central veins.  The mid basilic vein stenosis was treated with a 7 mm x 60 mm Athletis to nominal pressure for 2 minutes.  I then  treated the right subclavian central stenosis with a 10 mm x 80 mm Mustang to nominal pressure for 2 minutes.  No significant residual stenosis.  Wires and catheters were removed pursestring tied around the sheath.  Great thrill at completion.   Young Hensen, MD [facility] Office: [phone]

## 2023-08-19 DIAGNOSIS — Z992 Dependence on renal dialysis: Secondary | ICD-10-CM | POA: Diagnosis not present

## 2023-08-19 DIAGNOSIS — N186 End stage renal disease: Secondary | ICD-10-CM | POA: Diagnosis not present

## 2023-08-19 DIAGNOSIS — N2581 Secondary hyperparathyroidism of renal origin: Secondary | ICD-10-CM | POA: Diagnosis not present

## 2023-08-21 ENCOUNTER — Encounter (HOSPITAL_COMMUNITY): Payer: Self-pay | Admitting: Vascular Surgery

## 2023-08-22 DIAGNOSIS — N2581 Secondary hyperparathyroidism of renal origin: Secondary | ICD-10-CM | POA: Diagnosis not present

## 2023-08-22 DIAGNOSIS — Z992 Dependence on renal dialysis: Secondary | ICD-10-CM | POA: Diagnosis not present

## 2023-08-22 DIAGNOSIS — N186 End stage renal disease: Secondary | ICD-10-CM | POA: Diagnosis not present

## 2023-08-23 ENCOUNTER — Ambulatory Visit: Payer: Self-pay | Admitting: Cardiology

## 2023-08-23 ENCOUNTER — Ambulatory Visit (HOSPITAL_COMMUNITY)
Admission: RE | Admit: 2023-08-23 | Discharge: 2023-08-23 | Disposition: A | Discharge status: Home / Self Care | Source: Ambulatory Visit | Attending: Cardiology | Admitting: Cardiology

## 2023-08-23 DIAGNOSIS — I4821 Permanent atrial fibrillation: Secondary | ICD-10-CM | POA: Diagnosis not present

## 2023-08-23 DIAGNOSIS — I7121 Aneurysm of the ascending aorta, without rupture: Secondary | ICD-10-CM

## 2023-08-23 LAB — ECHOCARDIOGRAM COMPLETE
Area-P 1/2: 2.97 cm2
P 1/2 time: 2464 ms
S' Lateral: 3.8 cm

## 2023-08-23 NOTE — Progress Notes (Signed)
 Normal echocardiogram. Ascending aortic measurements are normal compared to 04/24/21, no change overall

## 2023-08-24 DIAGNOSIS — N186 End stage renal disease: Secondary | ICD-10-CM | POA: Diagnosis not present

## 2023-08-24 DIAGNOSIS — N2581 Secondary hyperparathyroidism of renal origin: Secondary | ICD-10-CM | POA: Diagnosis not present

## 2023-08-24 DIAGNOSIS — Z992 Dependence on renal dialysis: Secondary | ICD-10-CM | POA: Diagnosis not present

## 2023-08-24 NOTE — Progress Notes (Signed)
 Please let the patient know that the heart function is normal.  He has severe leaking tricuspid valve however presence of normal sized IVC and normal respiratory variation suggest no significant RV strain.  Also note that he had moderate to severe tricuspid regurgitation on prior study dated 04/24/2021.  Hence overall I feel that the echocardiogram is stable and will continue observation for now.  He will report worsening shortness of breath or worsening leg edema.

## 2023-08-26 DIAGNOSIS — Z992 Dependence on renal dialysis: Secondary | ICD-10-CM | POA: Diagnosis not present

## 2023-08-26 DIAGNOSIS — N2581 Secondary hyperparathyroidism of renal origin: Secondary | ICD-10-CM | POA: Diagnosis not present

## 2023-08-26 DIAGNOSIS — N186 End stage renal disease: Secondary | ICD-10-CM | POA: Diagnosis not present

## 2023-08-28 DIAGNOSIS — Z992 Dependence on renal dialysis: Secondary | ICD-10-CM | POA: Diagnosis not present

## 2023-08-28 DIAGNOSIS — N170 Acute kidney failure with tubular necrosis: Secondary | ICD-10-CM | POA: Diagnosis not present

## 2023-08-28 DIAGNOSIS — N186 End stage renal disease: Secondary | ICD-10-CM | POA: Diagnosis not present

## 2023-08-29 DIAGNOSIS — Z992 Dependence on renal dialysis: Secondary | ICD-10-CM | POA: Diagnosis not present

## 2023-08-29 DIAGNOSIS — N2581 Secondary hyperparathyroidism of renal origin: Secondary | ICD-10-CM | POA: Diagnosis not present

## 2023-08-29 DIAGNOSIS — N186 End stage renal disease: Secondary | ICD-10-CM | POA: Diagnosis not present

## 2023-08-31 DIAGNOSIS — N2581 Secondary hyperparathyroidism of renal origin: Secondary | ICD-10-CM | POA: Diagnosis not present

## 2023-08-31 DIAGNOSIS — Z992 Dependence on renal dialysis: Secondary | ICD-10-CM | POA: Diagnosis not present

## 2023-08-31 DIAGNOSIS — N186 End stage renal disease: Secondary | ICD-10-CM | POA: Diagnosis not present

## 2023-09-02 DIAGNOSIS — N186 End stage renal disease: Secondary | ICD-10-CM | POA: Diagnosis not present

## 2023-09-02 DIAGNOSIS — Z992 Dependence on renal dialysis: Secondary | ICD-10-CM | POA: Diagnosis not present

## 2023-09-02 DIAGNOSIS — N2581 Secondary hyperparathyroidism of renal origin: Secondary | ICD-10-CM | POA: Diagnosis not present

## 2023-09-05 DIAGNOSIS — N2581 Secondary hyperparathyroidism of renal origin: Secondary | ICD-10-CM | POA: Diagnosis not present

## 2023-09-05 DIAGNOSIS — Z992 Dependence on renal dialysis: Secondary | ICD-10-CM | POA: Diagnosis not present

## 2023-09-05 DIAGNOSIS — N186 End stage renal disease: Secondary | ICD-10-CM | POA: Diagnosis not present

## 2023-09-07 DIAGNOSIS — N186 End stage renal disease: Secondary | ICD-10-CM | POA: Diagnosis not present

## 2023-09-07 DIAGNOSIS — Z992 Dependence on renal dialysis: Secondary | ICD-10-CM | POA: Diagnosis not present

## 2023-09-07 DIAGNOSIS — N2581 Secondary hyperparathyroidism of renal origin: Secondary | ICD-10-CM | POA: Diagnosis not present

## 2023-09-09 DIAGNOSIS — N186 End stage renal disease: Secondary | ICD-10-CM | POA: Diagnosis not present

## 2023-09-09 DIAGNOSIS — N2581 Secondary hyperparathyroidism of renal origin: Secondary | ICD-10-CM | POA: Diagnosis not present

## 2023-09-09 DIAGNOSIS — Z992 Dependence on renal dialysis: Secondary | ICD-10-CM | POA: Diagnosis not present

## 2023-09-12 DIAGNOSIS — N186 End stage renal disease: Secondary | ICD-10-CM | POA: Diagnosis not present

## 2023-09-12 DIAGNOSIS — Z992 Dependence on renal dialysis: Secondary | ICD-10-CM | POA: Diagnosis not present

## 2023-09-12 DIAGNOSIS — N2581 Secondary hyperparathyroidism of renal origin: Secondary | ICD-10-CM | POA: Diagnosis not present

## 2023-09-14 DIAGNOSIS — N2581 Secondary hyperparathyroidism of renal origin: Secondary | ICD-10-CM | POA: Diagnosis not present

## 2023-09-14 DIAGNOSIS — Z992 Dependence on renal dialysis: Secondary | ICD-10-CM | POA: Diagnosis not present

## 2023-09-14 DIAGNOSIS — N186 End stage renal disease: Secondary | ICD-10-CM | POA: Diagnosis not present

## 2023-09-16 DIAGNOSIS — N186 End stage renal disease: Secondary | ICD-10-CM | POA: Diagnosis not present

## 2023-09-16 DIAGNOSIS — N2581 Secondary hyperparathyroidism of renal origin: Secondary | ICD-10-CM | POA: Diagnosis not present

## 2023-09-16 DIAGNOSIS — Z992 Dependence on renal dialysis: Secondary | ICD-10-CM | POA: Diagnosis not present

## 2023-09-19 DIAGNOSIS — N186 End stage renal disease: Secondary | ICD-10-CM | POA: Diagnosis not present

## 2023-09-19 DIAGNOSIS — Z992 Dependence on renal dialysis: Secondary | ICD-10-CM | POA: Diagnosis not present

## 2023-09-19 DIAGNOSIS — N2581 Secondary hyperparathyroidism of renal origin: Secondary | ICD-10-CM | POA: Diagnosis not present

## 2023-09-21 DIAGNOSIS — Z992 Dependence on renal dialysis: Secondary | ICD-10-CM | POA: Diagnosis not present

## 2023-09-21 DIAGNOSIS — N186 End stage renal disease: Secondary | ICD-10-CM | POA: Diagnosis not present

## 2023-09-21 DIAGNOSIS — N2581 Secondary hyperparathyroidism of renal origin: Secondary | ICD-10-CM | POA: Diagnosis not present

## 2023-09-23 DIAGNOSIS — Z992 Dependence on renal dialysis: Secondary | ICD-10-CM | POA: Diagnosis not present

## 2023-09-23 DIAGNOSIS — N186 End stage renal disease: Secondary | ICD-10-CM | POA: Diagnosis not present

## 2023-09-23 DIAGNOSIS — N2581 Secondary hyperparathyroidism of renal origin: Secondary | ICD-10-CM | POA: Diagnosis not present

## 2023-09-26 DIAGNOSIS — N2581 Secondary hyperparathyroidism of renal origin: Secondary | ICD-10-CM | POA: Diagnosis not present

## 2023-09-26 DIAGNOSIS — N186 End stage renal disease: Secondary | ICD-10-CM | POA: Diagnosis not present

## 2023-09-26 DIAGNOSIS — Z992 Dependence on renal dialysis: Secondary | ICD-10-CM | POA: Diagnosis not present

## 2023-09-28 DIAGNOSIS — Z992 Dependence on renal dialysis: Secondary | ICD-10-CM | POA: Diagnosis not present

## 2023-09-28 DIAGNOSIS — N186 End stage renal disease: Secondary | ICD-10-CM | POA: Diagnosis not present

## 2023-09-28 DIAGNOSIS — N170 Acute kidney failure with tubular necrosis: Secondary | ICD-10-CM | POA: Diagnosis not present

## 2023-09-28 DIAGNOSIS — N2581 Secondary hyperparathyroidism of renal origin: Secondary | ICD-10-CM | POA: Diagnosis not present

## 2023-09-30 DIAGNOSIS — N186 End stage renal disease: Secondary | ICD-10-CM | POA: Diagnosis not present

## 2023-09-30 DIAGNOSIS — N2581 Secondary hyperparathyroidism of renal origin: Secondary | ICD-10-CM | POA: Diagnosis not present

## 2023-09-30 DIAGNOSIS — Z992 Dependence on renal dialysis: Secondary | ICD-10-CM | POA: Diagnosis not present

## 2023-10-03 DIAGNOSIS — N2581 Secondary hyperparathyroidism of renal origin: Secondary | ICD-10-CM | POA: Diagnosis not present

## 2023-10-03 DIAGNOSIS — N186 End stage renal disease: Secondary | ICD-10-CM | POA: Diagnosis not present

## 2023-10-03 DIAGNOSIS — Z992 Dependence on renal dialysis: Secondary | ICD-10-CM | POA: Diagnosis not present

## 2023-10-05 DIAGNOSIS — Z992 Dependence on renal dialysis: Secondary | ICD-10-CM | POA: Diagnosis not present

## 2023-10-05 DIAGNOSIS — N186 End stage renal disease: Secondary | ICD-10-CM | POA: Diagnosis not present

## 2023-10-05 DIAGNOSIS — N2581 Secondary hyperparathyroidism of renal origin: Secondary | ICD-10-CM | POA: Diagnosis not present

## 2023-10-06 ENCOUNTER — Other Ambulatory Visit: Payer: Self-pay

## 2023-10-07 DIAGNOSIS — N2581 Secondary hyperparathyroidism of renal origin: Secondary | ICD-10-CM | POA: Diagnosis not present

## 2023-10-07 DIAGNOSIS — Z992 Dependence on renal dialysis: Secondary | ICD-10-CM | POA: Diagnosis not present

## 2023-10-07 DIAGNOSIS — N186 End stage renal disease: Secondary | ICD-10-CM | POA: Diagnosis not present

## 2023-10-10 ENCOUNTER — Other Ambulatory Visit: Payer: Self-pay

## 2023-10-10 DIAGNOSIS — Z992 Dependence on renal dialysis: Secondary | ICD-10-CM | POA: Diagnosis not present

## 2023-10-10 DIAGNOSIS — N2581 Secondary hyperparathyroidism of renal origin: Secondary | ICD-10-CM | POA: Diagnosis not present

## 2023-10-10 DIAGNOSIS — N186 End stage renal disease: Secondary | ICD-10-CM | POA: Diagnosis not present

## 2023-10-12 DIAGNOSIS — Z992 Dependence on renal dialysis: Secondary | ICD-10-CM | POA: Diagnosis not present

## 2023-10-12 DIAGNOSIS — N2581 Secondary hyperparathyroidism of renal origin: Secondary | ICD-10-CM | POA: Diagnosis not present

## 2023-10-12 DIAGNOSIS — N186 End stage renal disease: Secondary | ICD-10-CM | POA: Diagnosis not present

## 2023-10-14 DIAGNOSIS — Z992 Dependence on renal dialysis: Secondary | ICD-10-CM | POA: Diagnosis not present

## 2023-10-14 DIAGNOSIS — N2581 Secondary hyperparathyroidism of renal origin: Secondary | ICD-10-CM | POA: Diagnosis not present

## 2023-10-14 DIAGNOSIS — N186 End stage renal disease: Secondary | ICD-10-CM | POA: Diagnosis not present

## 2023-10-17 DIAGNOSIS — N186 End stage renal disease: Secondary | ICD-10-CM | POA: Diagnosis not present

## 2023-10-17 DIAGNOSIS — Z992 Dependence on renal dialysis: Secondary | ICD-10-CM | POA: Diagnosis not present

## 2023-10-17 DIAGNOSIS — N2581 Secondary hyperparathyroidism of renal origin: Secondary | ICD-10-CM | POA: Diagnosis not present

## 2023-10-19 DIAGNOSIS — Z992 Dependence on renal dialysis: Secondary | ICD-10-CM | POA: Diagnosis not present

## 2023-10-19 DIAGNOSIS — N2581 Secondary hyperparathyroidism of renal origin: Secondary | ICD-10-CM | POA: Diagnosis not present

## 2023-10-19 DIAGNOSIS — N186 End stage renal disease: Secondary | ICD-10-CM | POA: Diagnosis not present

## 2023-10-23 DIAGNOSIS — N186 End stage renal disease: Secondary | ICD-10-CM | POA: Diagnosis not present

## 2023-10-23 DIAGNOSIS — N2581 Secondary hyperparathyroidism of renal origin: Secondary | ICD-10-CM | POA: Diagnosis not present

## 2023-10-23 DIAGNOSIS — Z992 Dependence on renal dialysis: Secondary | ICD-10-CM | POA: Diagnosis not present

## 2023-10-24 DIAGNOSIS — N2581 Secondary hyperparathyroidism of renal origin: Secondary | ICD-10-CM | POA: Diagnosis not present

## 2023-10-24 DIAGNOSIS — Z992 Dependence on renal dialysis: Secondary | ICD-10-CM | POA: Diagnosis not present

## 2023-10-24 DIAGNOSIS — N186 End stage renal disease: Secondary | ICD-10-CM | POA: Diagnosis not present

## 2023-10-26 DIAGNOSIS — N186 End stage renal disease: Secondary | ICD-10-CM | POA: Diagnosis not present

## 2023-10-26 DIAGNOSIS — Z992 Dependence on renal dialysis: Secondary | ICD-10-CM | POA: Diagnosis not present

## 2023-10-26 DIAGNOSIS — N2581 Secondary hyperparathyroidism of renal origin: Secondary | ICD-10-CM | POA: Diagnosis not present

## 2023-10-27 ENCOUNTER — Other Ambulatory Visit: Payer: Self-pay

## 2023-10-27 ENCOUNTER — Encounter (HOSPITAL_COMMUNITY)
Admission: RE | Disposition: A | Discharge status: Home / Self Care | Payer: Self-pay | Source: Home / Self Care | Attending: Vascular Surgery

## 2023-10-27 ENCOUNTER — Ambulatory Visit (HOSPITAL_COMMUNITY)
Admission: RE | Admit: 2023-10-27 | Discharge: 2023-10-27 | Disposition: A | Discharge status: Home / Self Care | Attending: Vascular Surgery | Admitting: Vascular Surgery

## 2023-10-27 DIAGNOSIS — T82858A Stenosis of vascular prosthetic devices, implants and grafts, initial encounter: Secondary | ICD-10-CM | POA: Diagnosis not present

## 2023-10-27 DIAGNOSIS — Z87891 Personal history of nicotine dependence: Secondary | ICD-10-CM | POA: Insufficient documentation

## 2023-10-27 DIAGNOSIS — Z992 Dependence on renal dialysis: Secondary | ICD-10-CM | POA: Diagnosis not present

## 2023-10-27 DIAGNOSIS — Y832 Surgical operation with anastomosis, bypass or graft as the cause of abnormal reaction of the patient, or of later complication, without mention of misadventure at the time of the procedure: Secondary | ICD-10-CM | POA: Diagnosis not present

## 2023-10-27 DIAGNOSIS — N186 End stage renal disease: Secondary | ICD-10-CM | POA: Diagnosis not present

## 2023-10-27 DIAGNOSIS — E1122 Type 2 diabetes mellitus with diabetic chronic kidney disease: Secondary | ICD-10-CM | POA: Diagnosis not present

## 2023-10-27 DIAGNOSIS — I132 Hypertensive heart and chronic kidney disease with heart failure and with stage 5 chronic kidney disease, or end stage renal disease: Secondary | ICD-10-CM | POA: Insufficient documentation

## 2023-10-27 HISTORY — PX: A/V SHUNT INTERVENTION: CATH118220

## 2023-10-27 HISTORY — PX: VENOUS ANGIOPLASTY: CATH118376

## 2023-10-27 SURGERY — A/V SHUNT INTERVENTION
Anesthesia: LOCAL | Site: Arm Upper | Laterality: Right

## 2023-10-27 MED ORDER — LIDOCAINE HCL (PF) 1 % IJ SOLN
INTRAMUSCULAR | Status: AC
  Filled 2023-10-27: qty 30

## 2023-10-27 MED ORDER — LIDOCAINE HCL (PF) 1 % IJ SOLN
INTRAMUSCULAR | Status: DC | PRN
  Administered 2023-10-27 (×2): 2 mL via SUBCUTANEOUS

## 2023-10-27 MED ORDER — IODIXANOL 320 MG/ML IV SOLN
INTRAVENOUS | Status: DC | PRN
  Administered 2023-10-27: 25 mL via INTRAVENOUS

## 2023-10-27 MED ORDER — HEPARIN (PORCINE) IN NACL 1000-0.9 UT/500ML-% IV SOLN
INTRAVENOUS | Status: DC | PRN
  Administered 2023-10-27: 500 mL

## 2023-10-27 SURGICAL SUPPLY — 12 items
BALLOON MUSTANG 10X80X75 (BALLOONS) IMPLANT
BALLOON MUSTANG 6.0X40 75 (BALLOONS) IMPLANT
BALLOON MUSTANG 8X60X75 (BALLOONS) IMPLANT
GUIDEWIRE ANGLED .035 180CM (WIRE) IMPLANT
KIT ENCORE 26 ADVANTAGE (KITS) IMPLANT
KIT MICROPUNCTURE NIT STIFF (SHEATH) IMPLANT
SHEATH PINNACLE R/O II 6F 4CM (SHEATH) IMPLANT
SHEATH PROBE COVER 6X72 (BAG) IMPLANT
STOPCOCK MORSE 400PSI 3WAY (MISCELLANEOUS) IMPLANT
TRAY PV CATH (CUSTOM PROCEDURE TRAY) ×3 IMPLANT
TUBING CIL FLEX 10 FLL-RA (TUBING) IMPLANT
WIRE BENTSON .035X145CM (WIRE) IMPLANT

## 2023-10-27 NOTE — Op Note (Signed)
    Patient name: Austin Wheeler MRN: [id number] DOB: 09-17-1940 Sex: male  10/27/2023 Pre-operative Diagnosis: Malfunction right arm basilic vein fistula Post-operative diagnosis:  Same Surgeon:  Lonni DOROTHA Gaskins, MD Procedure Performed: 1.  Ultrasound-guided access right arm basilic vein fistula 2.  Right upper extremity fistulogram including central venogram 3.  Right central angioplasty subclavian vein of AV fistula (10 mm x 80 mm Mustang) 4.  Right peripheral angioplasty basilic vein fistula mid upper arm (8 mm x 60 mm Mustang) 5.  Right peripheral angioplasty basilic vein fistula at the arterial anastomosis with retrograde sheath (6 mm x 40 mm Mustang)  Indications: 83 year old male with end-stage renal disease that presents for right arm fistulogram due to malfunction of right brachiobasilic AV fistula.  He presents after risk benefits discussed.  Findings:   Ultrasound-guided access right basilic vein fistula.  Fistulogram initially showed a 60% central stenosis in the subclavian vein treated with a 10 mm Mustang.  There was about a 60% stenosis in the mid upper arm basilic vein that was treated with a 8 mm Mustang.  I then had to reaccessed the fistula retrograde and treated the arterial anastomosis and a high-grade stenosis adjacent to the arterial anastomosis in the basilic vein that was >80% with a 6 mm Mustang.  Widely patent at completion excellent thrill.   Procedure:  The patient was identified in the holding area and taken to [facility] lab.  Placed on the table in supine position.  The right arm was prepped draped standard sterile fashion.  Timeout was performed.  I used ultrasound and accessed the fistula under ultrasound guidance with a micro access needle and placed a microwire and micro sheath.  We then get a right upper extremity fistulogram including central venogram.  I elected to intervene.  I used a Bentson wire exchanged for a short 6 [place] sheath.  The central stenosis  was then treated in the subclavian vein with a 10 mm x 80 mm Mustang to nominal pressure for 2 minutes.   I then treated the upper arm stenosis with an 8 mm x 60 mm Mustang in the basilic vein to nominal pressure for 2 minutes.   The sheath was removed while typing down a pursestring with 4-0 Monocryl.  I reaccessed the fistula retrograde under ultrasound guidance and used a glidewire and 6 [place] sheath.  The arterial anastomosis and high-grade stenosis adjacent to the arterial anastomosis was treated with a 6 mm x 40 mm Mustang to nominal pressure for 2 minutes.  Widely patent fistula at completion.  Pursestring was then tied down and we removed the final retrograde sheath.  Taken to holding in stable condition.     Lonni DOROTHA Gaskins, MD [facility] [place] Office: [phone]

## 2023-10-27 NOTE — H&P (Signed)
 H&P    History of Present Illness: This is a 83 y.o. male with ESRD that presents for fistulogram due to malfunction of right arm AV fistula.  States for the last several weeks they have had issue with flow volumes.  Last underwent intervention on 08/18/2023 on his right basilic vein fistula with a peripheral and central angioplasty.  Past Medical History:  Diagnosis Date   Allergic rhinitis    Anemia    Atrial fibrillation (HCC)    Blind    walker and wheelchair   Blindness    BPH (benign prostatic hyperplasia)    Chronic kidney disease    Complication of anesthesia    Retention after eye surgery.  Patient's wife denies this dx as of 12/10/21.   Dependence on renal dialysis (HCC)    Diabetes mellitus without complication (HCC)    Type II, does not check levels at home.   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)    Hypertension    Impaired fasting glucose    Macular degeneration    Bilateral   Malignant tumor of prostate (HCC)    NVG (neovascular glaucoma) 2023   bilateral - followed by Dr Geneva   Obesity    Paroxysmal atrial fibrillation (HCC)    Prostate cancer [facility])    Renal failure    dialysis T-TH-S   Vitamin D deficiency    Walker as ambulation aid    also uses wheelchair    Past Surgical History:  Procedure Laterality Date   A/V FISTULAGRAM N/A 02/01/2023   Procedure: A/V Fistulagram;  Surgeon: Melia Lynwood ORN, MD;  Location: [facility] INVASIVE CV LAB;  Service: Cardiovascular;  Laterality: N/A;   A/V SHUNT INTERVENTION Right 08/18/2023   Procedure: A/V SHUNT INTERVENTION;  Surgeon: Gretta Lonni PARAS, MD;  Location: [facility] PV LAB;  Service: Cardiovascular;  Laterality: Right;   AV FISTULA PLACEMENT Right 05/13/2021   Procedure: CREATION OF RIGHT ARM  BRACHIOCEPHALIC FISTULA;  Surgeon: Sheree Penne Lonni, MD;  Location: [facility] OR;  Service: Vascular;  Laterality: Right;   AV FISTULA PLACEMENT Right 10/15/2021   Procedure: RIGHT ARTERIOVENOUS (AV) FISTULA CREATION VERSUS GRAFT;  Surgeon: Gretta Lonni PARAS, MD;  Location: [facility] OR;  Service: Vascular;  Laterality: Right;   BASCILIC VEIN TRANSPOSITION Right 12/13/2021   Procedure: RIGHT SECOND STAGE BASILIC VEIN TRANSPOSITION;  Surgeon: Gretta Lonni PARAS, MD;  Location: [facility] OR;  Service: Vascular;  Laterality: Right;   EYE SURGERY     Bilateral glaucoma and macular degeneration   FISTULA SUPERFICIALIZATION Right 07/16/2021   Procedure: RIGHT UPPER EXTREMITY FISTULA SUPERFICIALIZATION WITH BRANCH LIGATION;  Surgeon: Sheree Penne Lonni, MD;  Location: [facility] OR;  Service: Vascular;  Laterality: Right;  Block   IR FLUORO GUIDE CV LINE RIGHT  05/03/2021   IR PARACENTESIS  04/23/2021   IR US  GUIDE VASC ACCESS RIGHT  05/03/2021   PERIPHERAL VASCULAR BALLOON ANGIOPLASTY  02/01/2023   Procedure: PERIPHERAL VASCULAR BALLOON ANGIOPLASTY;  Surgeon: Melia Lynwood ORN, MD;  Location: [facility] LAB;  Service: Cardiovascular;;   VENOUS ANGIOPLASTY  08/18/2023   Procedure: VENOUS ANGIOPLASTY;  Surgeon: Gretta Lonni PARAS, MD;  Location: [facility] LAB;  Service: Cardiovascular;;  innominate and  periphereal    Allergies  Allergen Reactions   Guaifenesin Swelling and Rash    Hand and feet swelling from Robitussin 50 years ago   Robitussin Dm Max Day-Night Other (See Comments)    Other reaction(s): feet and leg swelling 50 years ago   Lexapro [Escitalopram] Nausea And Vomiting    Reported by [facility] - pt does not recall    Prior to Admission medications   Medication Sig Start Date End Date Taking? Authorizing Provider  acetaminophen  (TYLENOL ) 500 MG tablet Take 500 mg by mouth every 6 (six) hours as needed (pain.).    [provider]  apixaban  (ELIQUIS ) 2.5 MG TABS tablet Take 1  tablet (2.5 mg total) by mouth 2 (two) times daily. 05/14/21   Dickie Begun, MD  atorvastatin  (LIPITOR) 10 MG tablet Take 1 tablet (10 mg total) by mouth daily. 07/14/23 10/12/23  Ladona Heinz, MD  atropine  1 % ophthalmic solution Place 1 drop into the left eye at bedtime. 11/11/19   [provider]  brimonidine  (ALPHAGAN ) 0.2 % ophthalmic solution Place 1 drop into the left eye 3 (three) times daily. 02/22/21   [provider]  clobetasol cream (TEMOVATE) 0.05 % Apply 1 Application topically 2 (two) times daily. 11/09/22   [provider]  dorzolamide -timolol  (COSOPT ) 22.3-6.8 MG/ML ophthalmic solution Place 1 drop into the left eye 2 (two) times daily. 04/11/12   [provider]  dutasteride  (AVODART ) 0.5 MG capsule Take 1 capsule (0.5 mg total) by mouth every Monday, Wednesday, and Friday. 12/21/22   Stoioff, Glendia BROCKS, MD  latanoprost  (XALATAN ) 0.005 % ophthalmic solution Place 1 drop into the left eye at bedtime. 04/11/12   [provider]  lidocaine -prilocaine  (EMLA ) cream Apply 1 Application topically daily as needed (prior to dialysis).    [provider]  lidocaine -prilocaine  (EMLA ) cream Apply a thick layer externally to intact skin and cover with an occlusive dressing about 1 hour prior to dialysis and cover with occlusive dressing. 02/23/23     Nutritional Supplements (FEEDING SUPPLEMENT, NEPRO CARB STEADY,) LIQD Take 237 mLs by mouth 3 (three) times a week.    [provider]  triamcinolone  ointment (KENALOG ) 0.1 % Apply 1 Application topically 2 (two) times daily as needed (wound care).    [provider]    Social History   Socioeconomic History   Marital status: Married    Spouse name: Not on file   Number of children: Not on file   Years of education: Not on file   Highest education level: Not on file  Occupational History   Not on file  Tobacco Use   Smoking status: Former    Current packs/day: 0.00    Types:  Cigarettes    Quit date: 2019    Years since quitting: 6.6    Passive exposure: Never   Smokeless tobacco: Never  Vaping Use   Vaping status: Never Used  Substance and Sexual Activity   Alcohol use: Not Currently   Drug use: Never   Sexual activity: Yes  Other Topics Concern   Not on file  Social History Narrative   Not on file   Social Drivers of Health   Financial Resource Strain: Not on file  Food Insecurity: Not on file  Transportation Needs: No Transportation Needs (06/24/2021)   PRAPARE - Administrator, Civil Service (Medical): No    Lack of Transportation (Non-Medical): No  Physical Activity: Not on file  Stress: Not on file  Social Connections: Not on file  Intimate Partner Violence: Not on file     No family history on file.  ROS: [x]  Positive   [ ]  Negative   [ ]  All sytems reviewed and are negative  Cardiovascular: []  chest pain/pressure []  palpitations []  SOB lying flat []  DOE []  pain in legs while walking []  pain in legs at rest []  pain in legs at night []  non-healing ulcers []  hx of DVT []  swelling in legs  Pulmonary: []  productive cough []  asthma/wheezing []  home O2  Neurologic: []  weakness in []  arms []  legs []  numbness in []  arms []  legs []  hx of CVA []  mini stroke [] difficulty speaking or slurred speech []  temporary loss of vision in one eye []  dizziness  Hematologic: []  hx of cancer []  bleeding problems []  problems with blood clotting easily  Endocrine:   []  diabetes []  thyroid disease  GI []  vomiting blood []  blood in stool  GU: []  CKD/renal failure []  HD--[]  M/W/F or []  T/T/S []  burning with urination []  blood in urine  Psychiatric: []  anxiety []  depression  Musculoskeletal: []  arthritis []  joint pain  Integumentary: []  rashes []  ulcers  Constitutional: []  fever []  chills   Physical Examination  Vitals:   10/27/23 0714  BP: 110/84  Resp: 16   There is no height or weight on file to  calculate BMI.  General:  WDWN in NAD Gait: Not observed HENT: WNL, normocephalic Pulmonary: normal non-labored breathing Cardiac: regular, without  Murmurs, rubs or gallops Abdomen:  soft, NT/ND Vascular Exam/Pulses: Right basilic vein fistula pulsatile  CBC    Component Value Date/Time   WBC 5.6 02/16/2022 1950   RBC 3.90 (L) 02/16/2022 1950   HGB 12.3 (L) 02/16/2022 1950   HCT 37.8 (L) 02/16/2022 1950   PLT 152 02/16/2022 1950   MCV 96.9 02/16/2022 1950   MCH 31.5 02/16/2022 1950   MCHC 32.5 02/16/2022 1950   RDW 14.6 02/16/2022 1950   LYMPHSABS 0.9 02/16/2022 1950   MONOABS 0.8 02/16/2022 1950   EOSABS 0.2 02/16/2022 1950   BASOSABS 0.0 02/16/2022 1950    BMET    Component Value Date/Time   NA 141 02/16/2022 1950   NA 144 05/13/2020 1051   K 4.3 02/16/2022 1950   CL 103 02/16/2022 1950   CO2 25 02/16/2022 1950   GLUCOSE 157 (H) 02/16/2022 1950   BUN 46 (H) 02/16/2022 1950   BUN 11 05/13/2020 1051   CREATININE 7.63 (H) 02/16/2022 1950   CALCIUM  9.6 02/16/2022 1950   CALCIUM  8.8 05/11/2021 0110   GFRNONAA 7 (L) 02/16/2022 1950   GFRAA  02/06/2008 1915    >60        The eGFR has been calculated using the MDRD equation. This calculation has not been validated in all clinical    COAGS: Lab Results  Component Value Date   INR 1.5 (H) 02/16/2022   INR 1.4 (H) 04/29/2021   INR 1.5 (H) 04/27/2021     Non-Invasive Vascular Imaging:      ASSESSMENT/PLAN: This is a 83 y.o. male with ESRD that presents for fistulogram due to malfunction of right arm AV fistula.  States for the last several weeks they have had issue with flow volumes.  Last underwent intervention on 08/18/2023 on his right basilic vein fistula with a peripheral and central angioplasty..  Discussed plan for right arm fistulogram with possible intervention including angioplasty and stent.  All questions answered.  Lonni DOROTHA Gaskins, MD [facility] [place] Office:  [phone]  Lonni JINNY Gaskins

## 2023-10-28 DIAGNOSIS — N2581 Secondary hyperparathyroidism of renal origin: Secondary | ICD-10-CM | POA: Diagnosis not present

## 2023-10-28 DIAGNOSIS — N186 End stage renal disease: Secondary | ICD-10-CM | POA: Diagnosis not present

## 2023-10-28 DIAGNOSIS — Z992 Dependence on renal dialysis: Secondary | ICD-10-CM | POA: Diagnosis not present

## 2023-10-29 DIAGNOSIS — N170 Acute kidney failure with tubular necrosis: Secondary | ICD-10-CM | POA: Diagnosis not present

## 2023-10-29 DIAGNOSIS — Z992 Dependence on renal dialysis: Secondary | ICD-10-CM | POA: Diagnosis not present

## 2023-10-29 DIAGNOSIS — N186 End stage renal disease: Secondary | ICD-10-CM | POA: Diagnosis not present

## 2023-10-31 ENCOUNTER — Encounter (HOSPITAL_COMMUNITY): Payer: Self-pay | Admitting: Vascular Surgery

## 2023-10-31 DIAGNOSIS — Z992 Dependence on renal dialysis: Secondary | ICD-10-CM | POA: Diagnosis not present

## 2023-10-31 DIAGNOSIS — N186 End stage renal disease: Secondary | ICD-10-CM | POA: Diagnosis not present

## 2023-10-31 DIAGNOSIS — N2581 Secondary hyperparathyroidism of renal origin: Secondary | ICD-10-CM | POA: Diagnosis not present

## 2023-11-02 DIAGNOSIS — Z992 Dependence on renal dialysis: Secondary | ICD-10-CM | POA: Diagnosis not present

## 2023-11-02 DIAGNOSIS — N186 End stage renal disease: Secondary | ICD-10-CM | POA: Diagnosis not present

## 2023-11-02 DIAGNOSIS — N2581 Secondary hyperparathyroidism of renal origin: Secondary | ICD-10-CM | POA: Diagnosis not present

## 2023-11-04 DIAGNOSIS — N2581 Secondary hyperparathyroidism of renal origin: Secondary | ICD-10-CM | POA: Diagnosis not present

## 2023-11-04 DIAGNOSIS — Z992 Dependence on renal dialysis: Secondary | ICD-10-CM | POA: Diagnosis not present

## 2023-11-04 DIAGNOSIS — N186 End stage renal disease: Secondary | ICD-10-CM | POA: Diagnosis not present

## 2023-11-07 DIAGNOSIS — N2581 Secondary hyperparathyroidism of renal origin: Secondary | ICD-10-CM | POA: Diagnosis not present

## 2023-11-07 DIAGNOSIS — Z992 Dependence on renal dialysis: Secondary | ICD-10-CM | POA: Diagnosis not present

## 2023-11-07 DIAGNOSIS — N186 End stage renal disease: Secondary | ICD-10-CM | POA: Diagnosis not present

## 2023-11-09 DIAGNOSIS — N186 End stage renal disease: Secondary | ICD-10-CM | POA: Diagnosis not present

## 2023-11-09 DIAGNOSIS — N2581 Secondary hyperparathyroidism of renal origin: Secondary | ICD-10-CM | POA: Diagnosis not present

## 2023-11-09 DIAGNOSIS — Z992 Dependence on renal dialysis: Secondary | ICD-10-CM | POA: Diagnosis not present

## 2023-11-11 DIAGNOSIS — Z992 Dependence on renal dialysis: Secondary | ICD-10-CM | POA: Diagnosis not present

## 2023-11-11 DIAGNOSIS — N2581 Secondary hyperparathyroidism of renal origin: Secondary | ICD-10-CM | POA: Diagnosis not present

## 2023-11-11 DIAGNOSIS — N186 End stage renal disease: Secondary | ICD-10-CM | POA: Diagnosis not present

## 2023-11-14 DIAGNOSIS — N2581 Secondary hyperparathyroidism of renal origin: Secondary | ICD-10-CM | POA: Diagnosis not present

## 2023-11-14 DIAGNOSIS — Z992 Dependence on renal dialysis: Secondary | ICD-10-CM | POA: Diagnosis not present

## 2023-11-14 DIAGNOSIS — N186 End stage renal disease: Secondary | ICD-10-CM | POA: Diagnosis not present

## 2023-11-15 ENCOUNTER — Ambulatory Visit: Admitting: Podiatry

## 2023-11-15 ENCOUNTER — Encounter: Payer: Self-pay | Admitting: Podiatry

## 2023-11-15 DIAGNOSIS — B351 Tinea unguium: Secondary | ICD-10-CM

## 2023-11-15 DIAGNOSIS — M79674 Pain in right toe(s): Secondary | ICD-10-CM | POA: Diagnosis not present

## 2023-11-15 DIAGNOSIS — E1169 Type 2 diabetes mellitus with other specified complication: Secondary | ICD-10-CM | POA: Diagnosis not present

## 2023-11-15 DIAGNOSIS — M79675 Pain in left toe(s): Secondary | ICD-10-CM

## 2023-11-15 NOTE — Progress Notes (Signed)
 This patient presents  to my office for at risk foot care.  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  This patient is taking eliquis .This patient is unable to cut nails himself since the patient cannot reach his nails.These nails are painful walking and wearing shoes. He presents to the office with his wife. This patient presents for at risk foot care today.  General Appearance  Alert, conversant and in no acute stress.  Vascular  Dorsalis pedis and posterior tibial  pulses are  not palpable due to swelling  bilaterally.  Capillary return is within normal limits  bilaterally. Temperature is within normal limits  bilaterally.  Neurologic  Senn-Weinstein monofilament wire test diminished  bilaterally. Muscle power within normal limits bilaterally.  Nails Thick disfigured discolored nails with subungual debris  from hallux to fifth toes bilaterally. No evidence of bacterial infection or drainage bilaterally.  Orthopedic  No limitations of motion  feet .  No crepitus or effusions noted.  No bony pathology or digital deformities noted.  Skin  normotropic skin with no porokeratosis noted bilaterally.  No signs of infections or ulcers noted.     Onychomycosis  Pain in right toes  Pain in left toes  Consent was obtained for treatment procedures.   Mechanical debridement of nails 1-5  bilaterally performed with a nail nipper.  Filed with dremel without incident.    Return office visit  4    months                  Told patient to return for periodic foot care and evaluation due to potential at risk complications.   Ruffin Cotton DPM

## 2023-11-16 DIAGNOSIS — N2581 Secondary hyperparathyroidism of renal origin: Secondary | ICD-10-CM | POA: Diagnosis not present

## 2023-11-16 DIAGNOSIS — N186 End stage renal disease: Secondary | ICD-10-CM | POA: Diagnosis not present

## 2023-11-16 DIAGNOSIS — Z992 Dependence on renal dialysis: Secondary | ICD-10-CM | POA: Diagnosis not present

## 2023-11-18 DIAGNOSIS — Z992 Dependence on renal dialysis: Secondary | ICD-10-CM | POA: Diagnosis not present

## 2023-11-18 DIAGNOSIS — N2581 Secondary hyperparathyroidism of renal origin: Secondary | ICD-10-CM | POA: Diagnosis not present

## 2023-11-18 DIAGNOSIS — N186 End stage renal disease: Secondary | ICD-10-CM | POA: Diagnosis not present

## 2023-11-20 DIAGNOSIS — N186 End stage renal disease: Secondary | ICD-10-CM | POA: Diagnosis not present

## 2023-11-20 DIAGNOSIS — Z992 Dependence on renal dialysis: Secondary | ICD-10-CM | POA: Diagnosis not present

## 2023-11-20 DIAGNOSIS — N2581 Secondary hyperparathyroidism of renal origin: Secondary | ICD-10-CM | POA: Diagnosis not present

## 2023-11-22 ENCOUNTER — Other Ambulatory Visit: Payer: Self-pay

## 2023-11-22 DIAGNOSIS — Z992 Dependence on renal dialysis: Secondary | ICD-10-CM | POA: Diagnosis not present

## 2023-11-22 DIAGNOSIS — N2581 Secondary hyperparathyroidism of renal origin: Secondary | ICD-10-CM | POA: Diagnosis not present

## 2023-11-22 DIAGNOSIS — N186 End stage renal disease: Secondary | ICD-10-CM | POA: Diagnosis not present

## 2023-11-23 ENCOUNTER — Other Ambulatory Visit: Payer: Self-pay

## 2023-11-23 MED ORDER — LIDOCAINE-PRILOCAINE 2.5-2.5 % EX CREA
1.0000 | TOPICAL_CREAM | CUTANEOUS | 3 refills | Status: AC | PRN
  Filled 2023-11-23 (×2): qty 30, 15d supply, fill #0

## 2023-11-24 ENCOUNTER — Other Ambulatory Visit: Payer: Self-pay

## 2023-11-24 DIAGNOSIS — N2581 Secondary hyperparathyroidism of renal origin: Secondary | ICD-10-CM | POA: Diagnosis not present

## 2023-11-24 DIAGNOSIS — N186 End stage renal disease: Secondary | ICD-10-CM | POA: Diagnosis not present

## 2023-11-24 DIAGNOSIS — Z992 Dependence on renal dialysis: Secondary | ICD-10-CM | POA: Diagnosis not present

## 2023-11-27 DIAGNOSIS — N2581 Secondary hyperparathyroidism of renal origin: Secondary | ICD-10-CM | POA: Diagnosis not present

## 2023-11-27 DIAGNOSIS — N186 End stage renal disease: Secondary | ICD-10-CM | POA: Diagnosis not present

## 2023-11-27 DIAGNOSIS — Z992 Dependence on renal dialysis: Secondary | ICD-10-CM | POA: Diagnosis not present

## 2023-11-28 DIAGNOSIS — N170 Acute kidney failure with tubular necrosis: Secondary | ICD-10-CM | POA: Diagnosis not present

## 2023-11-29 DIAGNOSIS — N186 End stage renal disease: Secondary | ICD-10-CM | POA: Diagnosis not present

## 2023-11-29 DIAGNOSIS — Z992 Dependence on renal dialysis: Secondary | ICD-10-CM | POA: Diagnosis not present

## 2023-11-29 DIAGNOSIS — N2581 Secondary hyperparathyroidism of renal origin: Secondary | ICD-10-CM | POA: Diagnosis not present

## 2023-11-30 ENCOUNTER — Ambulatory Visit: Attending: Cardiology | Admitting: Cardiology

## 2023-11-30 ENCOUNTER — Encounter: Payer: Self-pay | Admitting: Cardiology

## 2023-11-30 VITALS — BP 112/69 | HR 90 | Resp 16 | Ht 77.0 in | Wt 226.0 lb

## 2023-11-30 DIAGNOSIS — R0982 Postnasal drip: Secondary | ICD-10-CM | POA: Diagnosis not present

## 2023-11-30 DIAGNOSIS — I700 Atherosclerosis of aorta: Secondary | ICD-10-CM

## 2023-11-30 DIAGNOSIS — I77810 Thoracic aortic ectasia: Secondary | ICD-10-CM | POA: Diagnosis not present

## 2023-11-30 DIAGNOSIS — I4821 Permanent atrial fibrillation: Secondary | ICD-10-CM | POA: Diagnosis not present

## 2023-11-30 DIAGNOSIS — D6869 Other thrombophilia: Secondary | ICD-10-CM

## 2023-11-30 NOTE — Progress Notes (Signed)
 Cardiology Office Note:  .   Date:  11/30/2023  ID:  Austin Wheeler, DOB [birth year], MRN [id number] PCP: Charlott Dorn LABOR, MD  [facility] Providers Cardiologist:  Gordy Bergamo, MD   History of Present Illness: .   Austin Wheeler is a 83 y.o. 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  He has also had abdominal CT in 2023 revealing no evidence of AAA.  Otherwise from cardiac standpoint remains asymptomatic.  He has not had any bleeding diathesis on Eliquis  2.5 mg twice daily.  Cardiac Studies relevent.    ECHOCARDIOGRAM COMPLETE 08/23/2023  1. Left ventricular ejection fraction, by estimation, is 55 to 60%. The left ventricle has normal function. The left ventricle has no regional wall motion abnormalities. Left ventricular diastolic function could not be evaluated. 2. Right ventricular systolic function is normal. The right ventricular size is mildly enlarged. There is normal pulmonary artery systolic pressure. The estimated right ventricular systolic pressure is 27.2 mmHg. 3. Right atrial size was moderately dilated. 4. Tricuspid valve regurgitation is severe.  5. Ao Asc diam: 4.00 cm. Ascending aorta measurements are within normal limits for age when indexed to body surface area.      Discussed the use of AI scribe software for clinical note transcription with the patient, who gave verbal consent to proceed.  History of Present Illness Austin Wheeler is an 83 year old male with atrial fibrillation and renal disease who presents for management of anticoagulation therapy. He is accompanied by his partner, who assists with his care. He was referred by Dr. Charlott for management of his atrial  fibrillation and anticoagulation therapy.  He has permanent atrial fibrillation and is on Eliquis  2.5 mg twice daily. Excessive bleeding post-dialysis has improved, attributed to dialysis technique. He is concerned about medication timing due to a shift in his dialysis schedule from early morning to midday.  He undergoes dialysis and has adjusted his schedule to accommodate his partner's travel needs. He experiences fatigue and headaches post-dialysis, limiting physical activity. He manages his diet and medication timing around his dialysis schedule.  He is on atorvastatin  10 mg for cholesterol management, which is not reflected in his primary care notes. He monitors his phosphorus levels closely.  Labs    ROS  Review of Systems  Cardiovascular:  Negative for chest pain, dyspnea on exertion and leg swelling.   Physical Exam:   VS:  There were no vitals taken for this visit.   Wt Readings from Last 3 Encounters:  07/14/23 228 lb (103.4 kg)  02/01/23 213 lb 13.5 oz (97 kg)  12/21/22 213 lb (96.6 kg)    BP Readings from Last 3 Encounters:  10/27/23 112/82  08/18/23 121/82  07/14/23 102/76   Physical Exam Neck:     Vascular: No carotid bruit or JVD.  Cardiovascular:     Rate and Rhythm: Normal rate. Rhythm irregular.     Pulses: Normal pulses and intact distal pulses.     Heart sounds: No murmur heard.    Comments:  Right brachial AV fistula for dialysis present Pulmonary:     Effort: Pulmonary effort is normal.     Breath sounds: Normal breath sounds.  Abdominal:     General: Bowel sounds are normal.     Palpations:  Abdomen is soft.  Musculoskeletal:     Right lower leg: No edema.     Left lower leg: No edema.  Skin:    Capillary Refill: Capillary refill takes less than 2 seconds.   EKG:       EKG 07/14/2023: Atypical atrial flutter with variable ventricular response, ventricular rate 108 bpm, incomplete right bundle branch block. Poor R wave progression, cannot exclude  anteroseptal infarct old.   ASSESSMENT AND PLAN: .      ICD-10-CM   1. Permanent atrial fibrillation (HCC)  I48.21     2. Secondary hypercoagulable state  D68.69     3. Dilatation of thoracic aorta  I77.810     4. Aortic atherosclerosis  I70.0     5. Hypercholesteremia  E78.00       Assessment and Plan Assessment & Plan Permanent atrial fibrillation with anticoagulation management Permanent atrial fibrillation managed with Eliquis  2.5 mg twice daily. No significant bleeding issues reported, and blood count is stable. Continued anticoagulation is necessary to reduce the high risk of stroke associated with AFib. Echocardiogram shows normal heart function and no significant valve problems. Discussed that if significant bleeding issues arise, alternative anticoagulation options may be considered. - Continue Eliquis  2.5 mg twice daily, administered in the morning upon waking and in the evening after dialysis. - Monitor for any significant bleeding issues.  End-stage renal disease on hemodialysis End-stage renal disease requiring hemodialysis.  - Tolerating Eliquis , he has not had any further prolonged bleeding from AV shunt access during dialysis.  Impaired ambulation and deconditioning Impaired ambulation and deconditioning likely due to fatigue post-dialysis. Encouraged to increase physical activity to prevent further muscle mass loss. - Engage in walking exercises within the house for 20 minutes every other day.  Pure hypercholesterolemia Pure hypercholesterolemia managed with atorvastatin  10 mg. Cholesterol levels are well-managed, and no changes are needed from a cardiovascular standpoint. Ensure primary care physician is aware of current medication list. - Continue atorvastatin  10 mg daily. - Ensure primary care physician is aware of current medication list. - In view of diabetes mellitus, goal LDL <70. - Patient also has aortic atherosclerosis again suggesting atherosclerotic  burden.  Aortic root dilatation -Normal for age and his height and weight.  No further evaluation is indicated.  Follow up: As needed.  Signed,  Gordy Bergamo, MD, [facility] 11/30/2023, 6:52 AM [facility] [address] [place], [place] [address] Phone: [phone]. Fax:  [phone]

## 2023-11-30 NOTE — Patient Instructions (Signed)
 Medication Instructions:  Your physician recommends that you continue on your current medications as directed. Please refer to the Current Medication list given to you today. *If you need a refill on your cardiac medications before your next appointment, please call your pharmacy*  Lab Work: NONE ORDERED If you have labs (blood work) drawn today and your tests are completely normal, you will receive your results only by: MyChart Message (if you have MyChart) OR A paper copy in the mail If you have any lab test that is abnormal or we need to change your treatment, we will call you to review the results.  Testing/Procedures: NONE ORDERED  Follow-Up: At [facility], you and your health needs are our priority.  As part of our continuing mission to provide you with exceptional heart care, our providers are all part of one team.  This team includes your primary Cardiologist (physician) and Advanced Practice Providers or APPs (Physician Assistants and Nurse Practitioners) who all work together to provide you with the care you need, when you need it.  Your next appointment:    AS NEEDED  Provider:   Gordy Bergamo, MD    We recommend signing up for the patient portal called MyChart.  Sign up information is provided on this After Visit Summary.  MyChart is used to connect with patients for Virtual Visits (Telemedicine).  Patients are able to view lab/test results, encounter notes, upcoming appointments, etc.  Non-urgent messages can be sent to your provider as well.   To learn more about what you can do with MyChart, go to [email or web address].   Other Instructions

## 2023-12-01 DIAGNOSIS — N2581 Secondary hyperparathyroidism of renal origin: Secondary | ICD-10-CM | POA: Diagnosis not present

## 2023-12-01 DIAGNOSIS — Z992 Dependence on renal dialysis: Secondary | ICD-10-CM | POA: Diagnosis not present

## 2023-12-05 DIAGNOSIS — E785 Hyperlipidemia, unspecified: Secondary | ICD-10-CM | POA: Diagnosis not present

## 2023-12-05 DIAGNOSIS — I480 Paroxysmal atrial fibrillation: Secondary | ICD-10-CM | POA: Diagnosis not present

## 2023-12-05 DIAGNOSIS — I10 Essential (primary) hypertension: Secondary | ICD-10-CM | POA: Diagnosis not present

## 2023-12-05 DIAGNOSIS — E1165 Type 2 diabetes mellitus with hyperglycemia: Secondary | ICD-10-CM | POA: Diagnosis not present

## 2023-12-05 DIAGNOSIS — N186 End stage renal disease: Secondary | ICD-10-CM | POA: Diagnosis not present

## 2023-12-05 DIAGNOSIS — Z0000 Encounter for general adult medical examination without abnormal findings: Secondary | ICD-10-CM | POA: Diagnosis not present

## 2023-12-05 DIAGNOSIS — R053 Chronic cough: Secondary | ICD-10-CM | POA: Diagnosis not present

## 2023-12-05 DIAGNOSIS — Z1331 Encounter for screening for depression: Secondary | ICD-10-CM | POA: Diagnosis not present

## 2023-12-05 DIAGNOSIS — H543 Unqualified visual loss, both eyes: Secondary | ICD-10-CM | POA: Diagnosis not present

## 2023-12-05 DIAGNOSIS — C61 Malignant neoplasm of prostate: Secondary | ICD-10-CM | POA: Diagnosis not present

## 2023-12-05 DIAGNOSIS — Z23 Encounter for immunization: Secondary | ICD-10-CM | POA: Diagnosis not present

## 2023-12-05 DIAGNOSIS — H4050X0 Glaucoma secondary to other eye disorders, unspecified eye, stage unspecified: Secondary | ICD-10-CM | POA: Diagnosis not present

## 2023-12-06 DIAGNOSIS — Z992 Dependence on renal dialysis: Secondary | ICD-10-CM | POA: Diagnosis not present

## 2023-12-06 DIAGNOSIS — N2581 Secondary hyperparathyroidism of renal origin: Secondary | ICD-10-CM | POA: Diagnosis not present

## 2023-12-08 DIAGNOSIS — N186 End stage renal disease: Secondary | ICD-10-CM | POA: Diagnosis not present

## 2023-12-08 DIAGNOSIS — N2581 Secondary hyperparathyroidism of renal origin: Secondary | ICD-10-CM | POA: Diagnosis not present

## 2023-12-08 DIAGNOSIS — Z992 Dependence on renal dialysis: Secondary | ICD-10-CM | POA: Diagnosis not present

## 2023-12-14 DIAGNOSIS — Z23 Encounter for immunization: Secondary | ICD-10-CM | POA: Diagnosis not present

## 2023-12-20 ENCOUNTER — Ambulatory Visit: Payer: Self-pay | Admitting: Urology

## 2023-12-20 DIAGNOSIS — N2581 Secondary hyperparathyroidism of renal origin: Secondary | ICD-10-CM | POA: Diagnosis not present

## 2023-12-20 DIAGNOSIS — N186 End stage renal disease: Secondary | ICD-10-CM | POA: Diagnosis not present

## 2023-12-20 DIAGNOSIS — Z992 Dependence on renal dialysis: Secondary | ICD-10-CM | POA: Diagnosis not present

## 2023-12-21 ENCOUNTER — Ambulatory Visit: Payer: Medicare PPO | Admitting: Urology

## 2023-12-22 DIAGNOSIS — N2581 Secondary hyperparathyroidism of renal origin: Secondary | ICD-10-CM | POA: Diagnosis not present

## 2023-12-22 DIAGNOSIS — Z992 Dependence on renal dialysis: Secondary | ICD-10-CM | POA: Diagnosis not present

## 2023-12-25 DIAGNOSIS — Z992 Dependence on renal dialysis: Secondary | ICD-10-CM | POA: Diagnosis not present

## 2023-12-25 DIAGNOSIS — N186 End stage renal disease: Secondary | ICD-10-CM | POA: Diagnosis not present

## 2023-12-25 DIAGNOSIS — N2581 Secondary hyperparathyroidism of renal origin: Secondary | ICD-10-CM | POA: Diagnosis not present

## 2023-12-26 ENCOUNTER — Ambulatory Visit: Admitting: Urology

## 2023-12-26 VITALS — BP 126/86 | HR 97 | Ht 77.0 in | Wt 237.0 lb

## 2023-12-26 DIAGNOSIS — N401 Enlarged prostate with lower urinary tract symptoms: Secondary | ICD-10-CM | POA: Diagnosis not present

## 2023-12-26 DIAGNOSIS — C61 Malignant neoplasm of prostate: Secondary | ICD-10-CM | POA: Diagnosis not present

## 2023-12-26 NOTE — Progress Notes (Unsigned)
 12/26/2023 8:02 AM   Austin Wheeler 1940/12/22 [id number]  Referring provider: Charlott Dorn LABOR, MD [address] [place],  [place] [address]  Chief Complaint  Patient presents with   Prostate Cancer   Urologic history: 1. Prostate cancer, moderate risk on active surveillance Prostate biopsy was performed at [facility] in [place] in April 2006 for a PSA of 6.5.        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                         Prostate volume 150 cc; could not decide on treatment options and elected active surveillance.                     Repeat prostate biopsy November 2010 showed a volume of 198 cc and no cancer.    prostate MRI performed September 2017 showed a 1.4 cm PIRADS 4 lesion in the transition zone in the right mid gland.  No adenopathy or evidence of extracapsular extension was identified.        elected to continue active surveillance.   2. BPH with lower urinary tract symptoms doxazosin  and dutasteride    HPI: Austin Wheeler is a 83 y.o. fe male   PMH: Past Medical History:  Diagnosis Date   Allergic rhinitis    Anemia    Atrial fibrillation (HCC)    Blind    walker and wheelchair   Blindness    BPH (benign prostatic hyperplasia)    Chronic kidney disease    Complication of anesthesia    Retention after eye surgery.  Patient's wife denies this dx as of 12/10/21.   Dependence on renal dialysis    Diabetes mellitus without complication (HCC)    Type II, does not check levels at home.   Dilatation of aorta    Elevated PSA    Enlarged prostate    Gait disorder    GERD (gastroesophageal reflux disease)    Glaucoma    Bilateral   Gout    Heart failure (HCC)    HLD (hyperlipidemia)    Hyperglycemia due to type 2 diabetes mellitus (HCC)    Hypertension    Impaired fasting glucose    Macular degeneration    Bilateral   Malignant tumor of prostate (HCC)    NVG  (neovascular glaucoma) 2023   bilateral - followed by Dr Geneva   Obesity    Paroxysmal atrial fibrillation (HCC)    Prostate cancer [facility])    Renal failure    dialysis T-TH-S   Vitamin D deficiency    Walker as ambulation aid    also uses wheelchair    Surgical History: Past Surgical History:  Procedure Laterality Date   A/V FISTULAGRAM N/A 02/01/2023   Procedure: A/V Fistulagram;  Surgeon: Melia Lynwood ORN, MD;  Location: [facility] LAB;  Service: Cardiovascular;  Laterality: N/A;   A/V SHUNT INTERVENTION Right 08/18/2023   Procedure: A/V SHUNT INTERVENTION;  Surgeon: Gretta Lonni PARAS, MD;  Location: [facility] PV LAB;  Service: Cardiovascular;  Laterality: Right;   A/V SHUNT INTERVENTION Right 10/27/2023   Procedure: A/V SHUNT INTERVENTION;  Surgeon: Gretta Lonni PARAS, MD;  Location: [facility];  Service: Cardiovascular;  Laterality: Right;   AV FISTULA PLACEMENT Right 05/13/2021   Procedure: CREATION OF RIGHT ARM BRACHIOCEPHALIC FISTULA;  Surgeon: Sheree Penne Lonni, MD;  Location: [facility]  OR;  Service: Vascular;  Laterality: Right;   AV FISTULA PLACEMENT Right 10/15/2021   Procedure: RIGHT ARTERIOVENOUS (AV) FISTULA CREATION VERSUS GRAFT;  Surgeon: Gretta Lonni PARAS, MD;  Location: [facility] OR;  Service: Vascular;  Laterality: Right;   BASCILIC VEIN TRANSPOSITION Right 12/13/2021   Procedure: RIGHT SECOND STAGE BASILIC VEIN TRANSPOSITION;  Surgeon: Gretta Lonni PARAS, MD;  Location: [facility] OR;  Service: Vascular;  Laterality: Right;   EYE SURGERY     Bilateral glaucoma and macular degeneration   FISTULA SUPERFICIALIZATION Right 07/16/2021   Procedure: RIGHT UPPER EXTREMITY FISTULA SUPERFICIALIZATION WITH BRANCH LIGATION;  Surgeon: Sheree Penne Lonni, MD;  Location: [facility] OR;  Service: Vascular;  Laterality: Right;  Block   IR FLUORO GUIDE CV LINE RIGHT  05/03/2021   IR PARACENTESIS  04/23/2021   IR US  GUIDE VASC ACCESS RIGHT  05/03/2021   PERIPHERAL VASCULAR BALLOON ANGIOPLASTY   02/01/2023   Procedure: PERIPHERAL VASCULAR BALLOON ANGIOPLASTY;  Surgeon: Melia Lynwood ORN, MD;  Location: [facility];  Service: Cardiovascular;;   VENOUS ANGIOPLASTY  08/18/2023   Procedure: VENOUS ANGIOPLASTY;  Surgeon: Gretta Lonni PARAS, MD;  Location: [facility];  Service: Cardiovascular;;  innominate and periphereal   VENOUS ANGIOPLASTY  10/27/2023   Procedure: VENOUS ANGIOPLASTY;  Surgeon: Gretta Lonni PARAS, MD;  Location: [facility];  Service: Cardiovascular;;  Basilic Mid-AVF; Swing Segment; Subclavian    Home Medications:  Allergies as of 12/26/2023       Reactions   Guaifenesin Swelling, Rash   Hand and feet swelling from Robitussin 50 years ago   Robitussin Dm Max Day-night Other (See Comments)   Other reaction(s): feet and leg swelling 50 years ago   Lexapro [escitalopram] Nausea And Vomiting   Reported by [facility] - pt does not recall        Medication List        Accurate as of December 26, 2023  8:02 AM. If you have any questions, ask your nurse or doctor.          acetaminophen  500 MG tablet Commonly known as: TYLENOL  Take 500 mg by mouth every 6 (six) hours as needed (pain.).   apixaban  2.5 MG Tabs tablet Commonly known as: ELIQUIS  Take 1 tablet (2.5 mg total) by mouth 2 (two) times daily.   atorvastatin  10 MG tablet Commonly known as: LIPITOR Take 1 tablet (10 mg total) by mouth daily.   atropine  1 % ophthalmic solution Place 1 drop into the left eye at bedtime.   brimonidine  0.2 % ophthalmic solution Commonly known as: ALPHAGAN  Place 1 drop into the left eye 3 (three) times daily.   clobetasol cream 0.05 % Commonly known as: TEMOVATE Apply 1 Application topically 2 (two) times daily.   dorzolamide -timolol  2-0.5 % ophthalmic solution Commonly known as: COSOPT  Place 1 drop into the left eye 2 (two) times daily.   dutasteride  0.5 MG capsule Commonly known as: AVODART  Take 1 capsule (0.5 mg total) by mouth every Monday,  Wednesday, and Friday.   feeding supplement (NEPRO CARB STEADY) Liqd Take 237 mLs by mouth 3 (three) times a week.   latanoprost  0.005 % ophthalmic solution Commonly known as: XALATAN  Place 1 drop into the left eye at bedtime.   lidocaine -prilocaine  cream Commonly known as: EMLA  Apply 1 Application topically as needed.   multivitamin Tabs tablet Take 1 tablet by mouth daily.   triamcinolone  ointment 0.1 % Commonly known as: KENALOG  Apply 1 Application topically 2 (two) times daily as needed (wound  care).        Allergies:  Allergies  Allergen Reactions   Guaifenesin Swelling and Rash    Hand and feet swelling from Robitussin 50 years ago   Robitussin Dm Max Day-Night Other (See Comments)    Other reaction(s): feet and leg swelling 50 years ago   Lexapro [Escitalopram] Nausea And Vomiting    Reported by [facility] - pt does not recall    Family History: No family history on file.  Social History:  reports that he quit smoking about 6 years ago. His smoking use included cigarettes. He has never been exposed to tobacco smoke. He has never used smokeless tobacco. He reports that he does not currently use alcohol. He reports that he does not use drugs.   Physical Exam: There were no vitals taken for this visit.  Constitutional:  Alert, No acute distress. HEENT: Fate AT Respiratory: Normal respiratory effort, no increased work of breathing. GU:  Psychiatric: Normal mood and affect.  Laboratory Data: Lab Results  Component Value Date   WBC 5.6 02/16/2022   HGB 12.3 (L) 02/16/2022   HCT 37.8 (L) 02/16/2022   MCV 96.9 02/16/2022   PLT 152 02/16/2022    Lab Results  Component Value Date   CREATININE 7.63 (H) 02/16/2022    No results found for: PSA  No results found for: TESTOSTERONE  Lab Results  Component Value Date   HGBA1C 4.8 04/23/2021    Urinalysis    Component Value Date/Time   COLORURINE RED BIOCHEMICALS MAY BE AFFECTED BY COLOR (A)  02/06/2008 1846   APPEARANCEUR TURBID (A) 02/06/2008 1846   LABSPEC 1.013 02/06/2008 1846   PHURINE 5.0 02/06/2008 1846   GLUCOSEU 100 (A) 02/06/2008 1846   HGBUR LARGE (A) 02/06/2008 1846   BILIRUBINUR LARGE (A) 02/06/2008 1846   KETONESUR 40 (A) 02/06/2008 1846   PROTEINUR 100 (A) 02/06/2008 1846   UROBILINOGEN 1.0 02/06/2008 1846   NITRITE POSITIVE (A) 02/06/2008 1846   LEUKOCYTESUR LARGE (A) 02/06/2008 1846    Lab Results  Component Value Date   BACTERIA RARE 02/06/2008    Pertinent Imaging: *** No results found for this or any previous visit.  No results found for this or any previous visit.  No results found for this or any previous visit.  No results found for this or any previous visit.  Results for orders placed during the hospital encounter of 04/23/21  US  RENAL  Narrative CLINICAL DATA:  Acute kidney injury  EXAM: RENAL / URINARY TRACT ULTRASOUND COMPLETE  COMPARISON:  Same day CT.  FINDINGS: Right Kidney:  Renal measurements: 13.1 x 6.0 x 7.3 cm = volume: 300.4 mL. Increased renal cortical echogenicity. No hydronephrosis.  Left Kidney:  Renal measurements: 13.9 x 5.7 x 6.5 cm = volume: 269.0 mL. Increased renal cortical echogenicity. No hydronephrosis. There is a simple appearing cyst in the inferior pole measuring 4.3 x 3.0 x 2.8 cm.  Bladder:  Foley catheter in place.  Other:  Bilateral pleural effusions.  Large volume ascites.  IMPRESSION: Increased renal cortical echogenicity bilaterally, as can be seen in medical renal disease. No hydronephrosis.  Bilateral pleural effusions and large volume ascites noted.   Electronically Signed By: Jacob  Kahn M.D. On: 04/23/2021 14:52  No results found for this or any previous visit.  No results found for this or any previous visit.  No results found for this or any previous visit.   Assessment & Plan:    There are no diagnoses linked to  this encounter.  No follow-ups on  file.  Glendia JAYSON Barba, MD  [facility] [address] [place], [place] [address] [phone]

## 2023-12-27 LAB — PSA: Prostate Specific Ag, Serum: 4.2 ng/mL — ABNORMAL HIGH (ref 0.0–4.0)

## 2023-12-28 ENCOUNTER — Ambulatory Visit: Payer: Self-pay | Admitting: Urology

## 2023-12-29 ENCOUNTER — Encounter: Payer: Self-pay | Admitting: Urology

## 2023-12-29 DIAGNOSIS — N2581 Secondary hyperparathyroidism of renal origin: Secondary | ICD-10-CM | POA: Diagnosis not present

## 2023-12-29 DIAGNOSIS — N170 Acute kidney failure with tubular necrosis: Secondary | ICD-10-CM | POA: Diagnosis not present

## 2023-12-29 DIAGNOSIS — N186 End stage renal disease: Secondary | ICD-10-CM | POA: Diagnosis not present

## 2023-12-29 DIAGNOSIS — Z992 Dependence on renal dialysis: Secondary | ICD-10-CM | POA: Diagnosis not present

## 2024-01-01 DIAGNOSIS — N2581 Secondary hyperparathyroidism of renal origin: Secondary | ICD-10-CM | POA: Diagnosis not present

## 2024-01-01 DIAGNOSIS — N186 End stage renal disease: Secondary | ICD-10-CM | POA: Diagnosis not present

## 2024-01-01 DIAGNOSIS — Z992 Dependence on renal dialysis: Secondary | ICD-10-CM | POA: Diagnosis not present

## 2024-01-04 ENCOUNTER — Other Ambulatory Visit: Payer: Self-pay | Admitting: Cardiology

## 2024-01-04 DIAGNOSIS — I700 Atherosclerosis of aorta: Secondary | ICD-10-CM

## 2024-01-08 DIAGNOSIS — N186 End stage renal disease: Secondary | ICD-10-CM | POA: Diagnosis not present

## 2024-01-08 DIAGNOSIS — N2581 Secondary hyperparathyroidism of renal origin: Secondary | ICD-10-CM | POA: Diagnosis not present

## 2024-01-08 DIAGNOSIS — Z992 Dependence on renal dialysis: Secondary | ICD-10-CM | POA: Diagnosis not present

## 2024-01-10 DIAGNOSIS — Z992 Dependence on renal dialysis: Secondary | ICD-10-CM | POA: Diagnosis not present

## 2024-01-10 DIAGNOSIS — N2581 Secondary hyperparathyroidism of renal origin: Secondary | ICD-10-CM | POA: Diagnosis not present

## 2024-01-12 DIAGNOSIS — N186 End stage renal disease: Secondary | ICD-10-CM | POA: Diagnosis not present

## 2024-01-15 DIAGNOSIS — N2581 Secondary hyperparathyroidism of renal origin: Secondary | ICD-10-CM | POA: Diagnosis not present

## 2024-01-15 DIAGNOSIS — Z992 Dependence on renal dialysis: Secondary | ICD-10-CM | POA: Diagnosis not present

## 2024-01-17 ENCOUNTER — Other Ambulatory Visit: Payer: Self-pay | Admitting: Urology

## 2024-01-17 DIAGNOSIS — N401 Enlarged prostate with lower urinary tract symptoms: Secondary | ICD-10-CM

## 2024-01-17 DIAGNOSIS — N186 End stage renal disease: Secondary | ICD-10-CM | POA: Diagnosis not present

## 2024-01-17 DIAGNOSIS — Z992 Dependence on renal dialysis: Secondary | ICD-10-CM | POA: Diagnosis not present

## 2024-01-17 DIAGNOSIS — N2581 Secondary hyperparathyroidism of renal origin: Secondary | ICD-10-CM | POA: Diagnosis not present

## 2024-01-18 DIAGNOSIS — Z961 Presence of intraocular lens: Secondary | ICD-10-CM | POA: Diagnosis not present

## 2024-01-18 DIAGNOSIS — H401123 Primary open-angle glaucoma, left eye, severe stage: Secondary | ICD-10-CM | POA: Diagnosis not present

## 2024-01-18 DIAGNOSIS — H31012 Macula scars of posterior pole (postinflammatory) (post-traumatic), left eye: Secondary | ICD-10-CM | POA: Diagnosis not present

## 2024-01-18 DIAGNOSIS — H2511 Age-related nuclear cataract, right eye: Secondary | ICD-10-CM | POA: Diagnosis not present

## 2024-01-19 DIAGNOSIS — Z992 Dependence on renal dialysis: Secondary | ICD-10-CM | POA: Diagnosis not present

## 2024-01-19 DIAGNOSIS — N2581 Secondary hyperparathyroidism of renal origin: Secondary | ICD-10-CM | POA: Diagnosis not present

## 2024-01-19 DIAGNOSIS — N186 End stage renal disease: Secondary | ICD-10-CM | POA: Diagnosis not present

## 2024-01-22 ENCOUNTER — Ambulatory Visit: Admitting: Cardiology

## 2024-01-23 DIAGNOSIS — N186 End stage renal disease: Secondary | ICD-10-CM | POA: Diagnosis not present

## 2024-01-23 DIAGNOSIS — N2581 Secondary hyperparathyroidism of renal origin: Secondary | ICD-10-CM | POA: Diagnosis not present

## 2024-01-28 DIAGNOSIS — Z992 Dependence on renal dialysis: Secondary | ICD-10-CM | POA: Diagnosis not present

## 2024-01-28 DIAGNOSIS — N186 End stage renal disease: Secondary | ICD-10-CM | POA: Diagnosis not present

## 2024-01-28 DIAGNOSIS — N170 Acute kidney failure with tubular necrosis: Secondary | ICD-10-CM | POA: Diagnosis not present

## 2024-01-30 IMAGING — US IR PARACENTESIS
1 series · 2 of 2 positions shown · non-contrast
Comparison: none

INDICATION: Patient history congestive heart failure, admitted with acute renal
and anasarca found to have new onset ascites. Request IR for
therapeutic paracentesis

[Series 1: ir (person_name)/(person_name) · 2 of 2 slices shown]
[im 1/2]
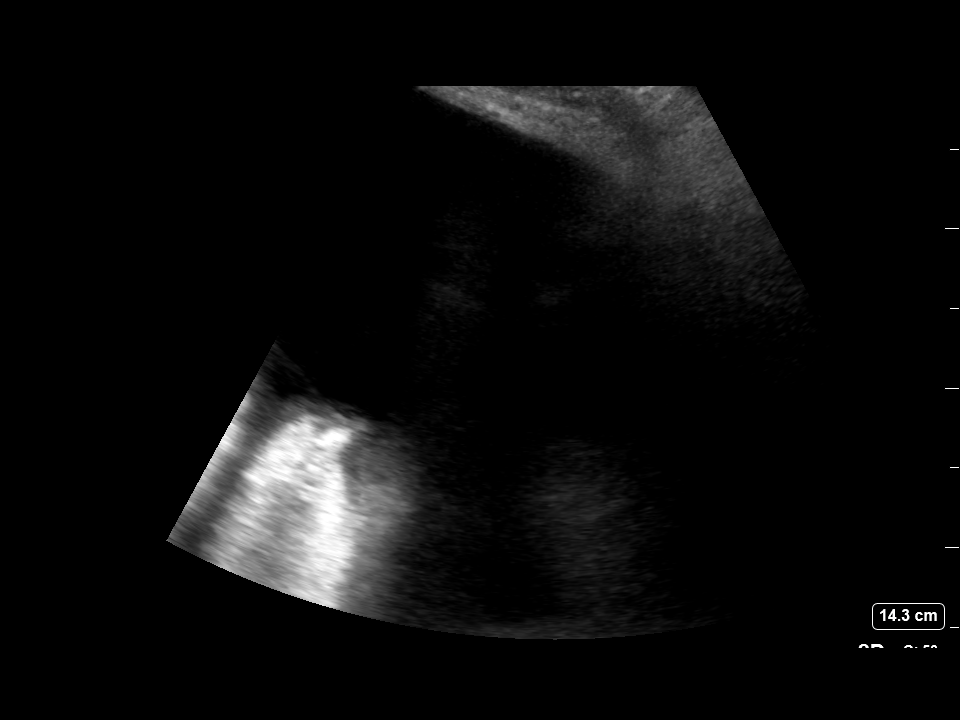
[im 2/2]
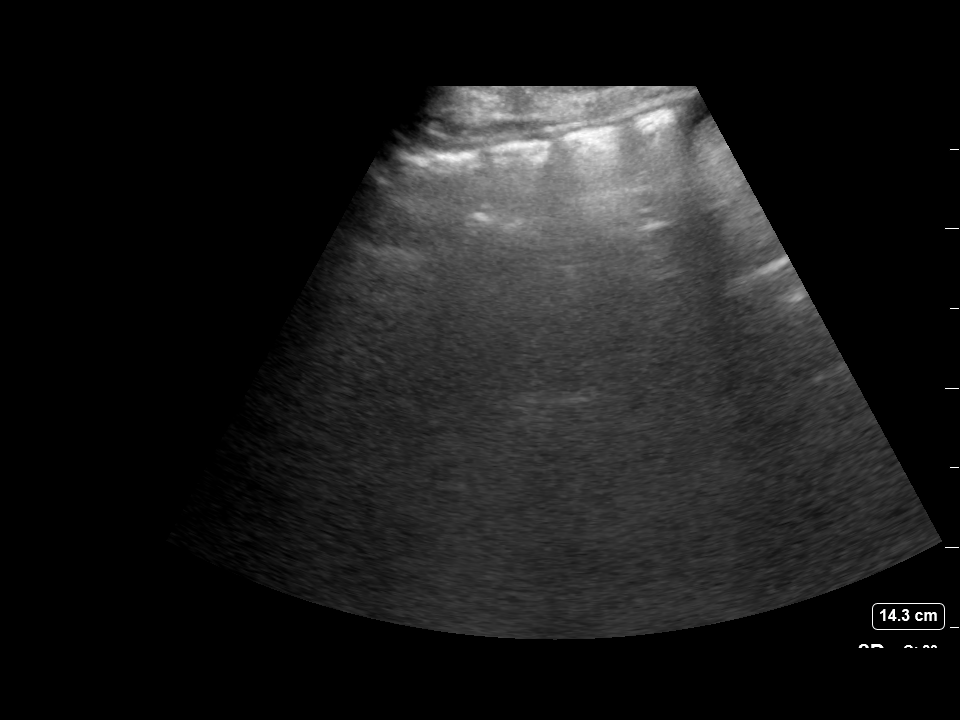

[2 of 2 positions shown; findings below may reference images not displayed]

EXAM:
ULTRASOUND GUIDED THERAPEUTIC PARACENTESIS

MEDICATIONS:
7 mL 1% lidocaine

COMPLICATIONS:
None immediate.

PROCEDURE:
Informed written consent was obtained from the patient after a
discussion of the risks, benefits and alternatives to treatment. A
timeout was performed prior to the initiation of the procedure.

Initial ultrasound scanning demonstrates a large amount of ascites
within the right lower abdominal quadrant. The right lower abdomen
was prepped and draped in the usual sterile fashion. 1% lidocaine
was used for local anesthesia.

Following this, a 19 gauge, 7-cm, Yueh catheter was introduced. An
ultrasound image was saved for documentation purposes. The
paracentesis was performed. The catheter was removed and a dressing
was applied. The patient tolerated the procedure well without
immediate post procedural complication.
FINDINGS: A total of approximately 8.3 L of clear yellow fluid was removed.
IMPRESSION: Successful ultrasound-guided paracentesis yielding 8.3 liters of
peritoneal fluid.

Read by Marinez, Vivien

## 2024-01-30 IMAGING — US US RENAL
1 series · 14 of 25 positions shown · non-contrast
Comparison: Same day CT.

CLINICAL DATA: Acute kidney injury

EXAM:
RENAL / URINARY TRACT ULTRASOUND COMPLETE

[Series 1: us renal · 14 of 68 slices shown]
[im 1/68]
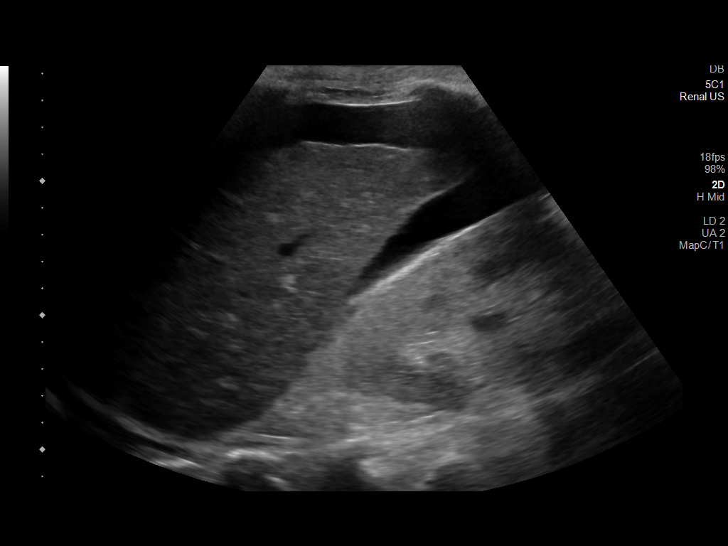
[im 6/68]
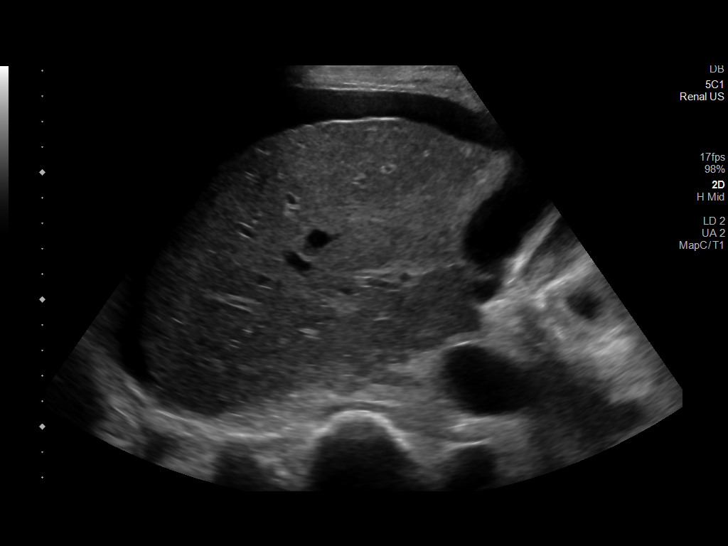
[im 12/68]
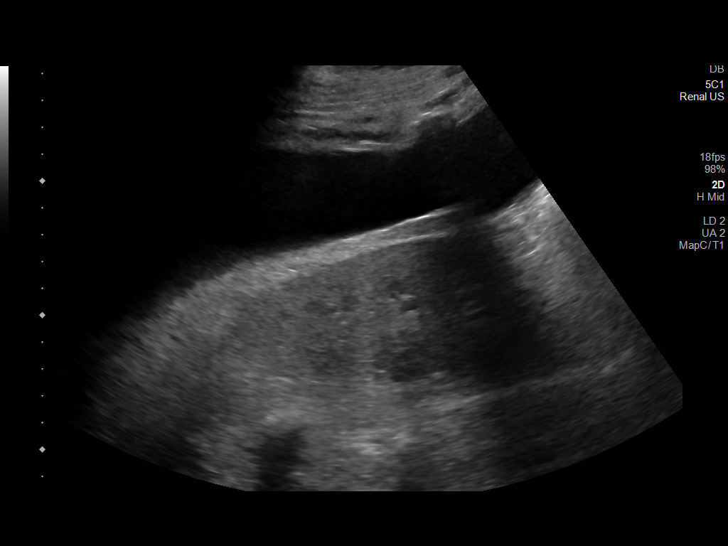
[im 17/68]
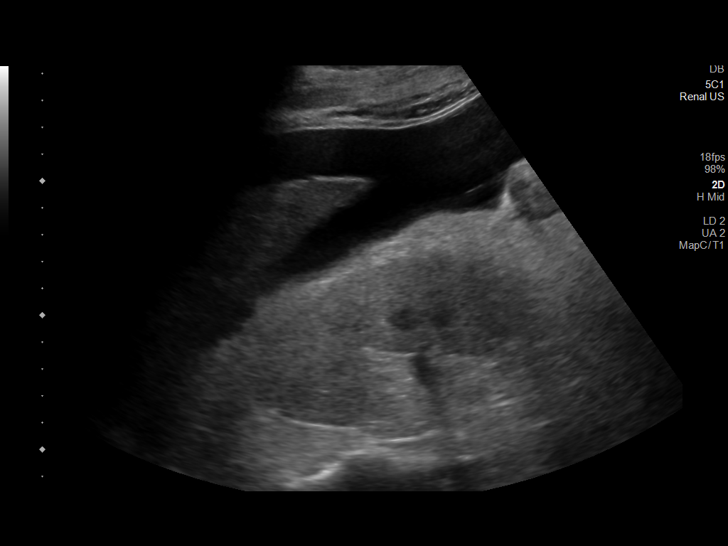
[im 23/68]
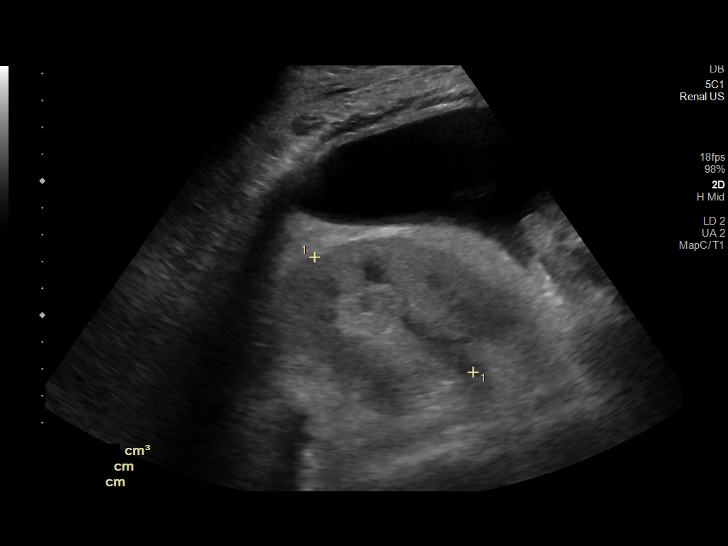
[im 26/68]
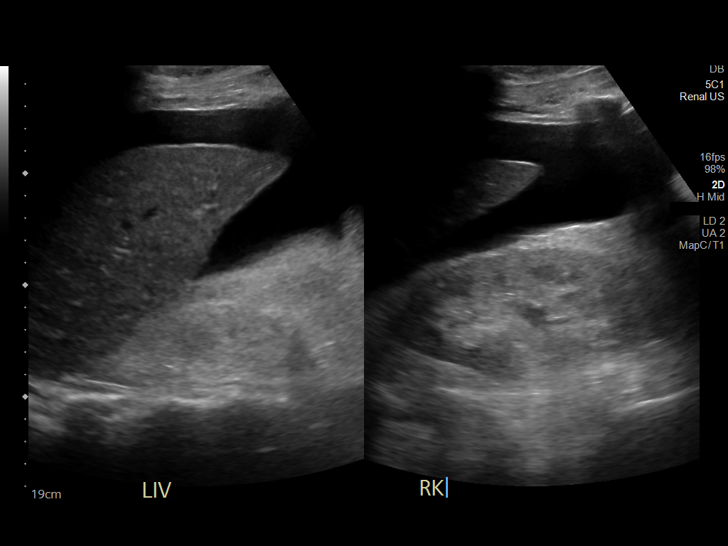
[im 31/68]
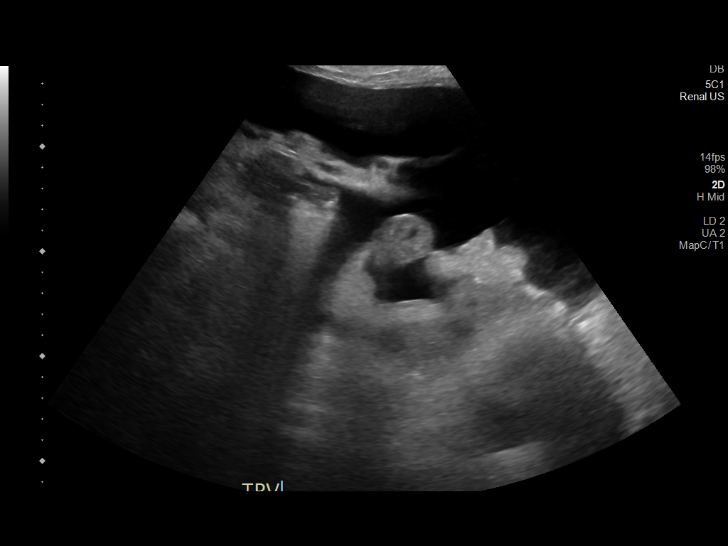
[im 37/68]
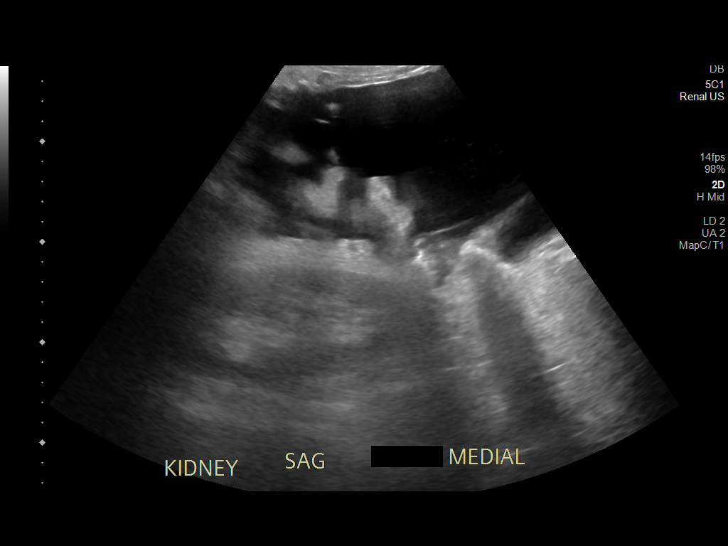
[im 42/68]
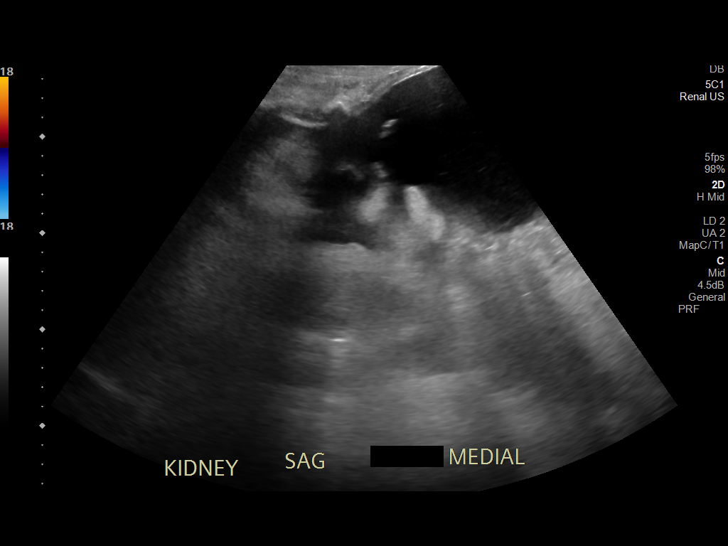
[im 45/68]
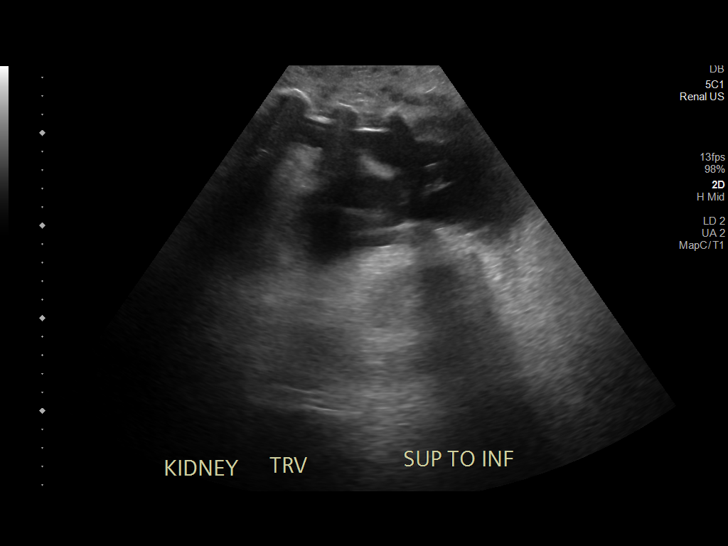
[im 51/68]
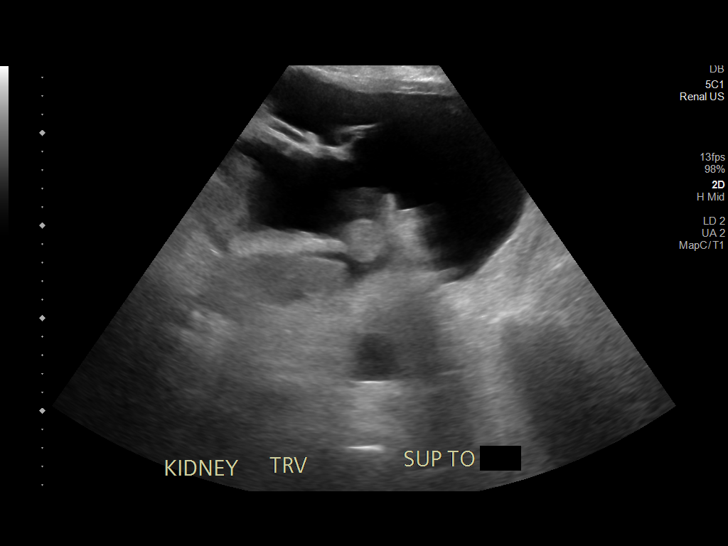
[im 56/68]
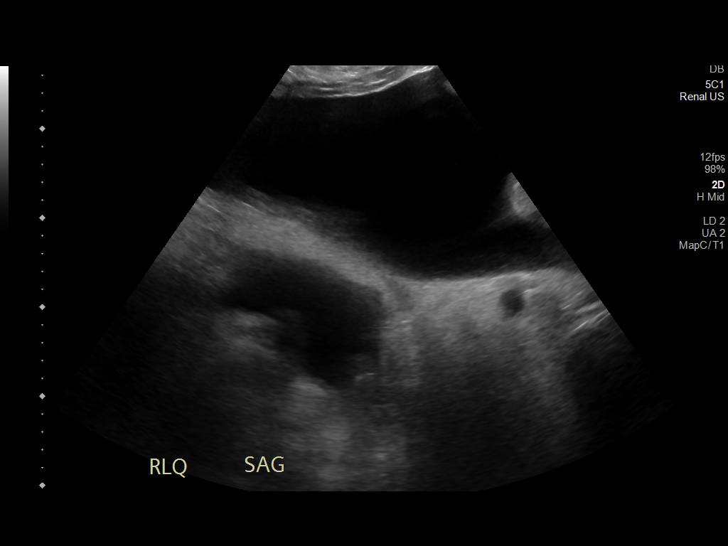
[im 62/68]
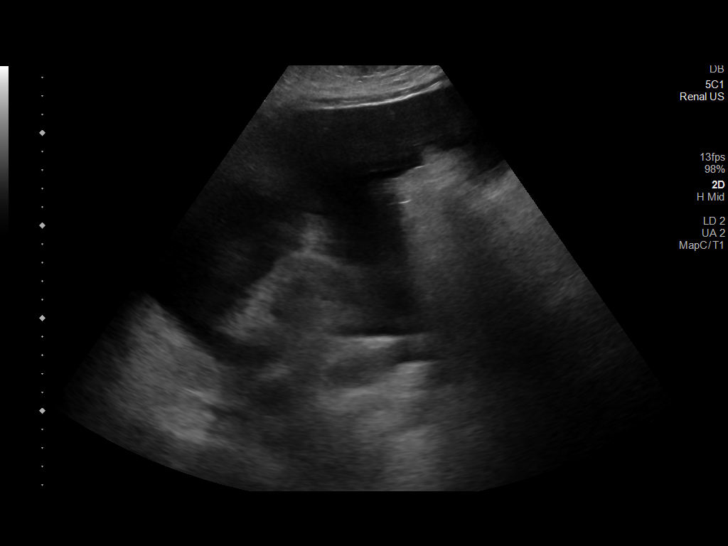
[im 68/68]
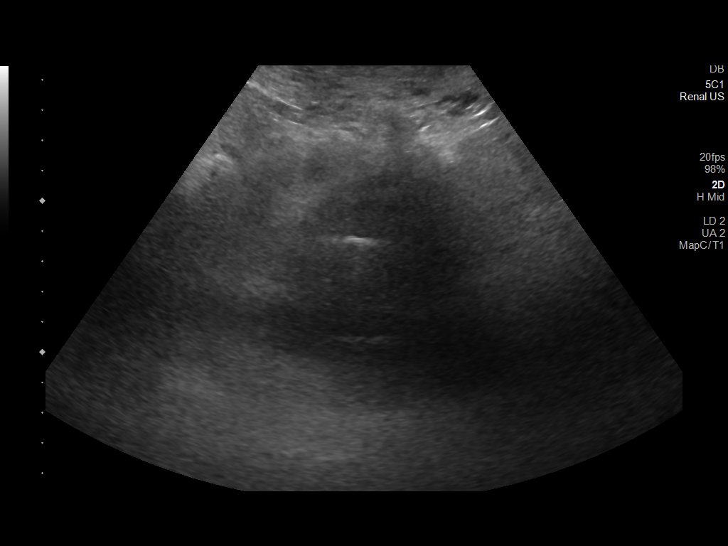

[14 of 25 positions shown; findings below may reference images not displayed]

FINDINGS: Right Kidney:

Renal measurements: 13.1 x 6.0 x 7.3 cm = volume: 300.4 mL.
Increased renal cortical echogenicity. No hydronephrosis.

Left Kidney:

Renal measurements: 13.9 x 5.7 x 6.5 cm = volume: 269.0 mL.
Increased renal cortical echogenicity. No hydronephrosis. There is a
simple appearing cyst in the inferior pole measuring 4.3 x 3.0 x
cm.

Bladder:

Foley catheter in place.

Other:

Bilateral pleural effusions.  Large volume ascites.
IMPRESSION: Increased renal cortical echogenicity bilaterally, as can be seen in
medical renal disease. No hydronephrosis.

Bilateral pleural effusions and large volume ascites noted.

## 2024-01-30 IMAGING — CT CT ABD-PELV W/O CM
2 of 4 series · 16 of 46 positions shown, 18 images · non-contrast
Comparison: None.

CLINICAL DATA: Renal failure, anuria



[Series 3: a/p w/o 5mm · axial · non-contrast · 0.98mm/px · z∈[+916,+1431]mm · 13 of 113 slices shown, 15 images]
[im 5/113  soft-tissue]
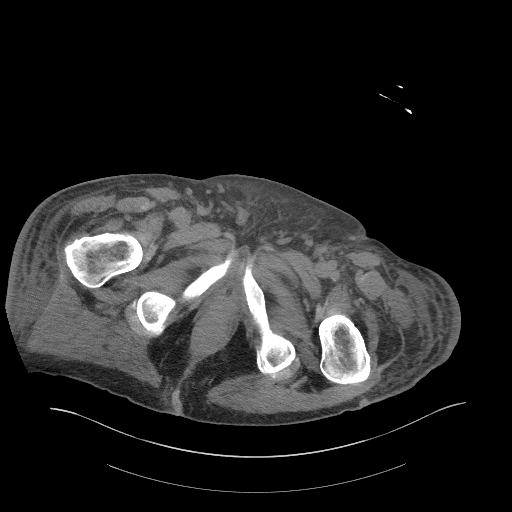
[im 5/113  bone]
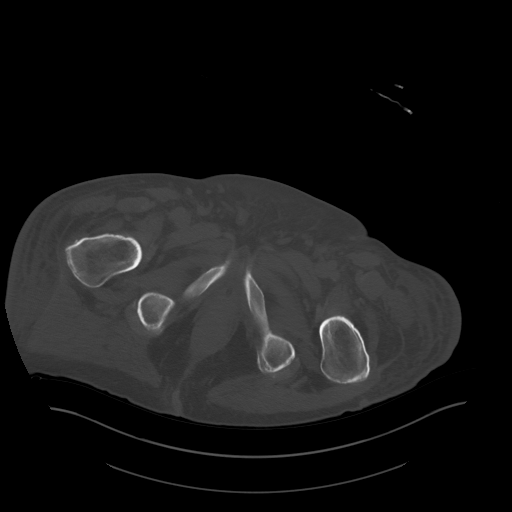
[im 15/113  soft-tissue]
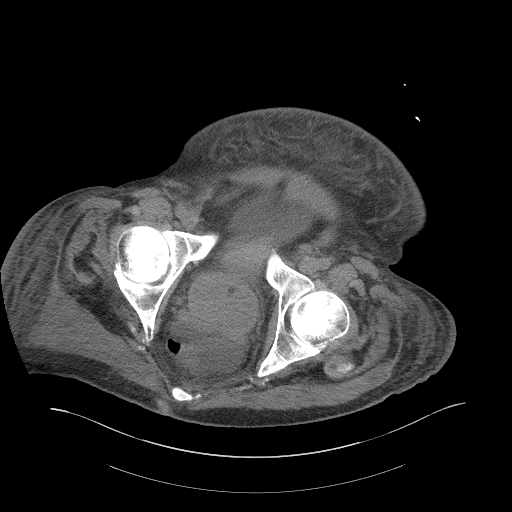
[im 24/113  soft-tissue]
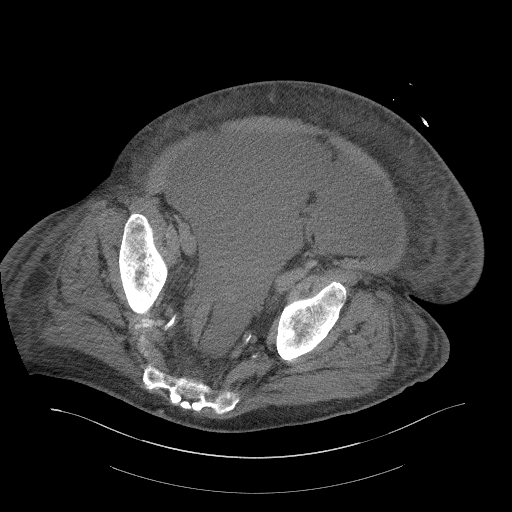
[im 33/113  soft-tissue]
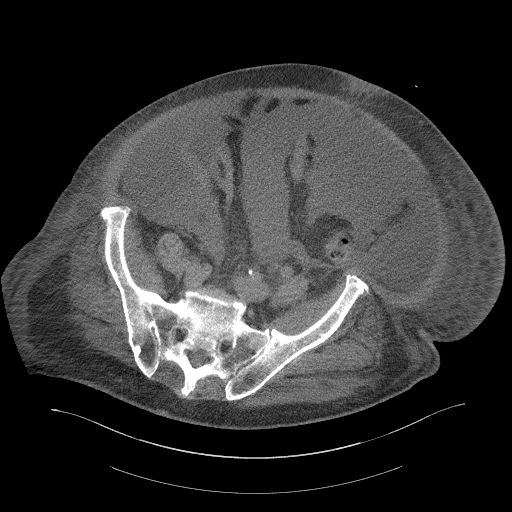
[im 38/113  soft-tissue]
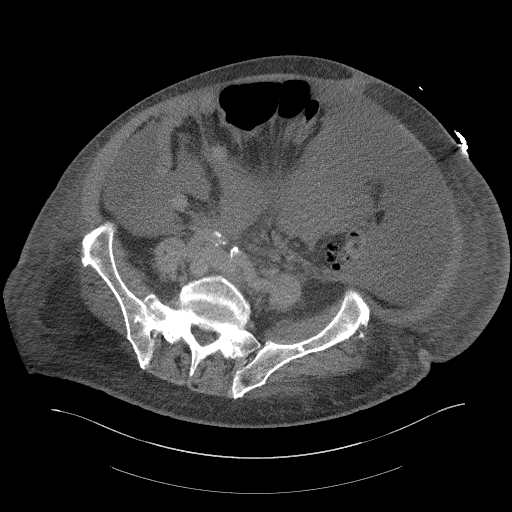
[im 47/113  soft-tissue]
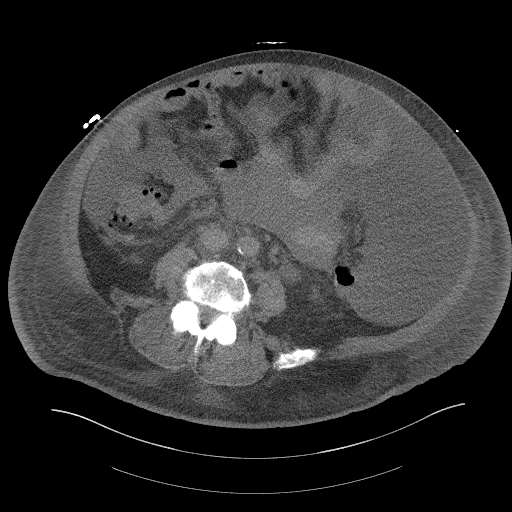
[im 57/113  soft-tissue]
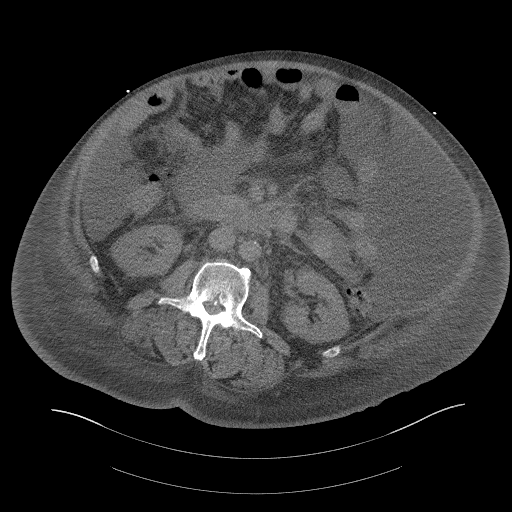
[im 66/113  soft-tissue]
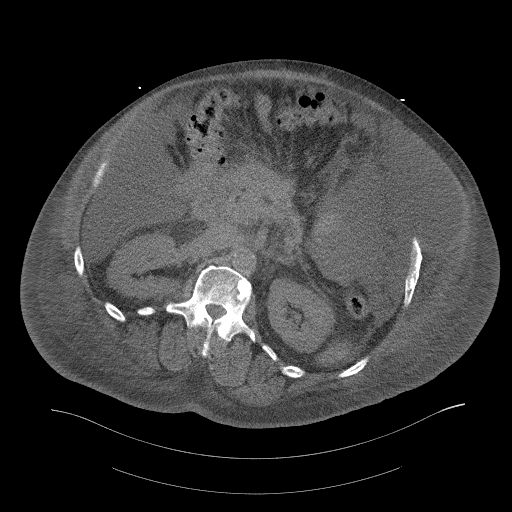
[im 75/113  soft-tissue]
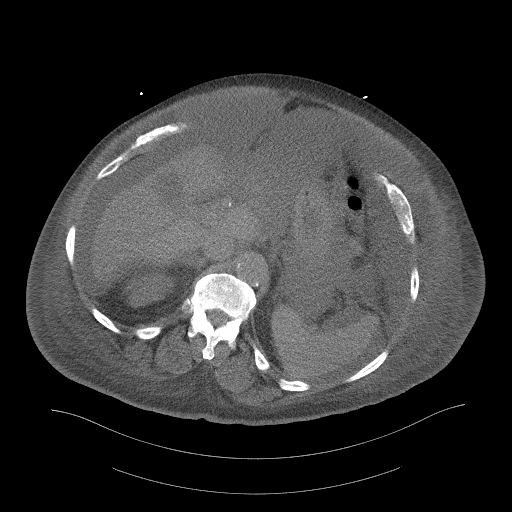
[im 75/113  bone]
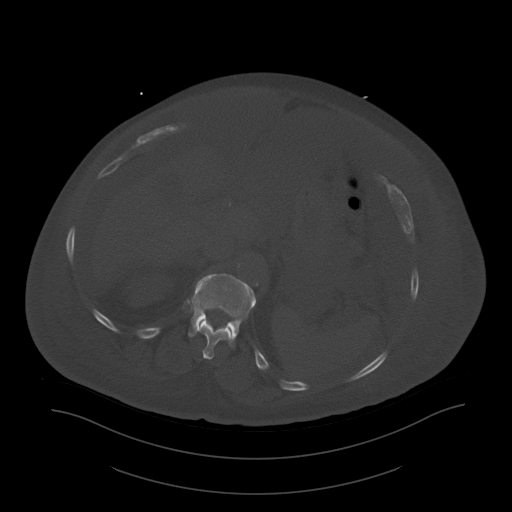
[im 80/113  soft-tissue]
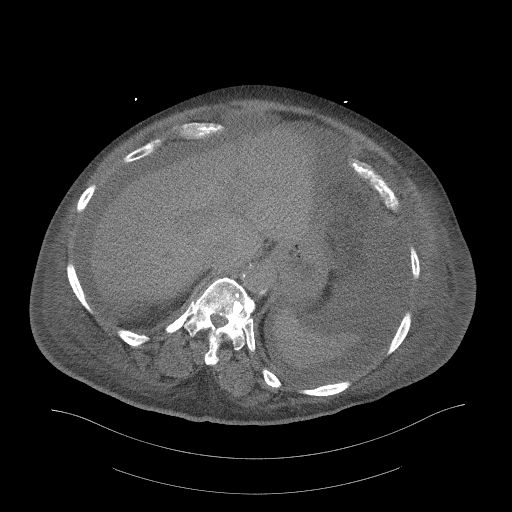
[im 89/113  soft-tissue]
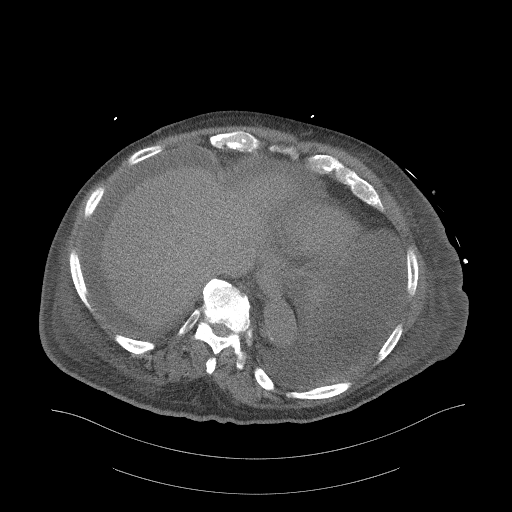
[im 99/113  soft-tissue]
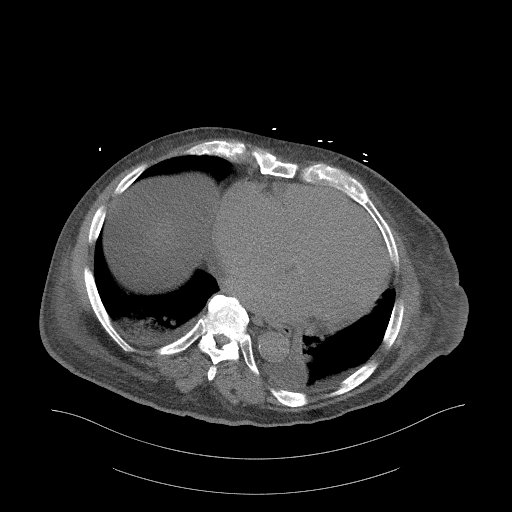
[im 108/113  soft-tissue]
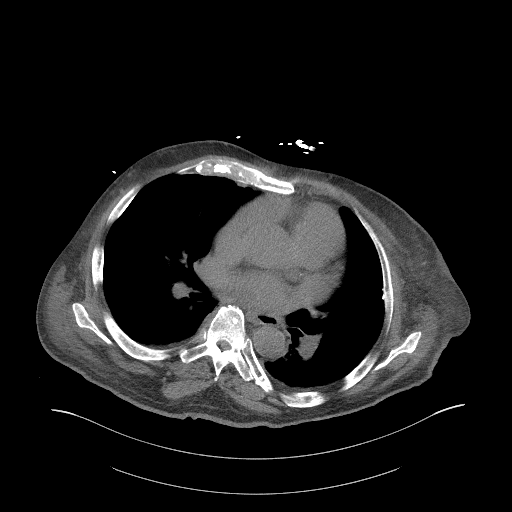

[Series 6: a/p w/o cor · coronal · non-contrast · 1.10mm/px · 3 of 227 slices shown]
[im 76/227  soft-tissue]
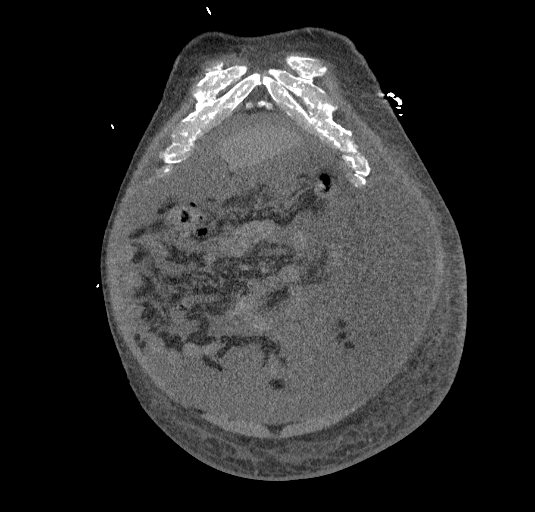
[im 101/227  soft-tissue]
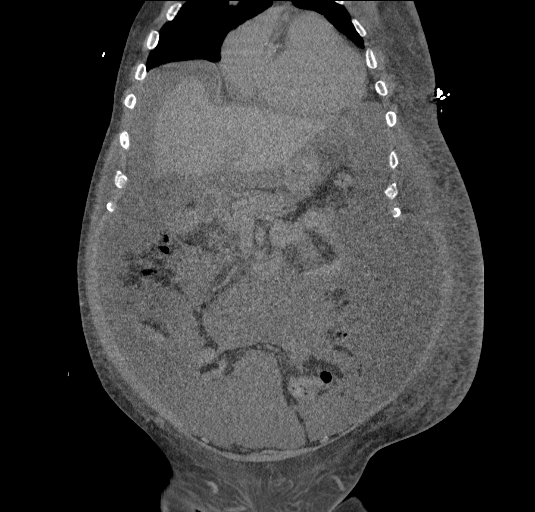
[im 126/227  soft-tissue]
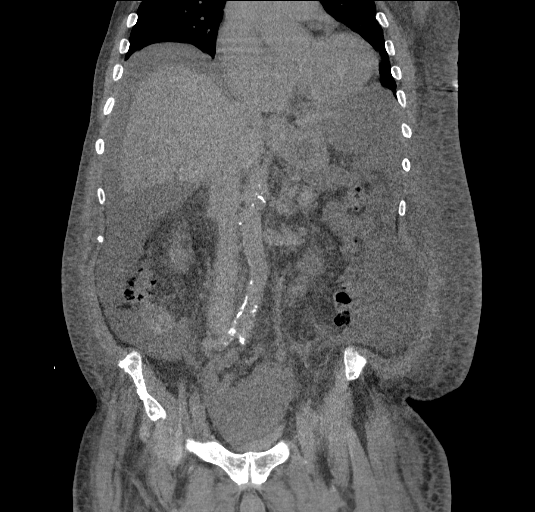

[16 of 46 positions shown; findings below may reference images not displayed]

FINDINGS: Lower chest: Trace bilateral pleural effusions with associated
compressive atelectasis. Cardiomegaly. Coronary artery
atherosclerosis.

Hepatobiliary: Unremarkable unenhanced appearance of the liver. No
focal liver lesion identified. Gallbladder within normal limits. No
hyperdense gallstone. No biliary dilatation.

Pancreas: Unremarkable. No pancreatic ductal dilatation or
surrounding inflammatory changes.

Spleen: Normal in size without focal abnormality.

Adrenals/Urinary Tract: Unremarkable adrenal glands. 3.3 cm lower
pole left renal cyst. Kidneys are otherwise unremarkable. No renal
stone or hydronephrosis. Urinary bladder is decompressed by Foley
catheter.

Stomach/Bowel: Small hiatal hernia. Stomach appears otherwise within
normal limits. Colonic diverticulosis. No evidence of bowel wall
thickening, distention, or inflammatory changes.

Vascular/Lymphatic: Scattered aortoiliac atherosclerotic
calcifications without aneurysm. No abdominopelvic lymphadenopathy.

Reproductive: Prostatomegaly.

Other: Moderate-large volume ascites. No pneumoperitoneum. No
abdominal wall hernia.

Musculoskeletal: Diffuse anasarca. Scattered densely sclerotic bone
islands within the pelvis and spine. No acute osseous findings.
IMPRESSION: 1. No evidence of obstructive uropathy.
2. Moderate-large volume ascites with diffuse anasarca.
3. Trace bilateral pleural effusions with associated compressive
atelectasis.
4. Colonic diverticulosis without evidence of acute diverticulitis.
5. Prostatomegaly.
6. Aortic Atherosclerosis ([id number].L).

## 2024-02-01 ENCOUNTER — Emergency Department (HOSPITAL_COMMUNITY)
Admission: EM | Admit: 2024-02-01 | Discharge: 2024-02-01 | Disposition: A | Discharge status: Home / Self Care | Attending: Emergency Medicine | Admitting: Emergency Medicine

## 2024-02-01 ENCOUNTER — Other Ambulatory Visit: Payer: Self-pay

## 2024-02-01 DIAGNOSIS — Z7901 Long term (current) use of anticoagulants: Secondary | ICD-10-CM | POA: Insufficient documentation

## 2024-02-01 DIAGNOSIS — R000 Tachycardia, unspecified: Secondary | ICD-10-CM | POA: Diagnosis not present

## 2024-02-01 DIAGNOSIS — R0902 Hypoxemia: Secondary | ICD-10-CM | POA: Diagnosis not present

## 2024-02-01 DIAGNOSIS — R450 Nervousness: Secondary | ICD-10-CM | POA: Diagnosis not present

## 2024-02-01 DIAGNOSIS — T82590A Other mechanical complication of surgically created arteriovenous fistula, initial encounter: Secondary | ICD-10-CM | POA: Insufficient documentation

## 2024-02-01 DIAGNOSIS — R58 Hemorrhage, not elsewhere classified: Secondary | ICD-10-CM | POA: Diagnosis not present

## 2024-02-01 DIAGNOSIS — Y712 Prosthetic and other implants, materials and accessory cardiovascular devices associated with adverse incidents: Secondary | ICD-10-CM | POA: Diagnosis not present

## 2024-02-01 DIAGNOSIS — R457 State of emotional shock and stress, unspecified: Secondary | ICD-10-CM | POA: Diagnosis not present

## 2024-02-01 DIAGNOSIS — R42 Dizziness and giddiness: Secondary | ICD-10-CM | POA: Diagnosis not present

## 2024-02-01 DIAGNOSIS — T829XXA Unspecified complication of cardiac and vascular prosthetic device, implant and graft, initial encounter: Secondary | ICD-10-CM

## 2024-02-01 LAB — CBC WITH DIFFERENTIAL/PLATELET
Abs Immature Granulocytes: 0.05 K/uL (ref 0.00–0.07)
Basophils Absolute: 0 K/uL (ref 0.0–0.1)
Basophils Relative: 1 %
Eosinophils Absolute: 0.1 K/uL (ref 0.0–0.5)
Eosinophils Relative: 1 %
HCT: 35.3 % — ABNORMAL LOW (ref 39.0–52.0)
Hemoglobin: 11.4 g/dL — ABNORMAL LOW (ref 13.0–17.0)
Immature Granulocytes: 1 %
Lymphocytes Relative: 25 %
Lymphs Abs: 2.1 K/uL (ref 0.7–4.0)
MCH: 32.8 pg (ref 26.0–34.0)
MCHC: 32.3 g/dL (ref 30.0–36.0)
MCV: 101.4 fL — ABNORMAL HIGH (ref 80.0–100.0)
Monocytes Absolute: 1 K/uL (ref 0.1–1.0)
Monocytes Relative: 12 %
Neutro Abs: 5.1 K/uL (ref 1.7–7.7)
Neutrophils Relative %: 60 %
Platelets: 219 K/uL (ref 150–400)
RBC: 3.48 MIL/uL — ABNORMAL LOW (ref 4.22–5.81)
RDW: 13.5 % (ref 11.5–15.5)
WBC: 8.5 K/uL (ref 4.0–10.5)
nRBC: 0 % (ref 0.0–0.2)

## 2024-02-01 LAB — BASIC METABOLIC PANEL WITH GFR
Anion gap: 20 — ABNORMAL HIGH (ref 5–15)
BUN: 33 mg/dL — ABNORMAL HIGH (ref 8–23)
CO2: 25 mmol/L (ref 22–32)
Calcium: 8.9 mg/dL (ref 8.9–10.3)
Chloride: 92 mmol/L — ABNORMAL LOW (ref 98–111)
Creatinine, Ser: 8.16 mg/dL — ABNORMAL HIGH (ref 0.61–1.24)
GFR, Estimated: 6 mL/min — ABNORMAL LOW (ref >60)
Glucose, Bld: 112 mg/dL — ABNORMAL HIGH (ref 70–99)
Potassium: 3.8 mmol/L (ref 3.5–5.1)
Sodium: 137 mmol/L (ref 135–145)

## 2024-02-01 IMAGING — DX DG CHEST 1V PORT
1 series · 1 of 1 positions shown · non-contrast
Comparison: 04/23/2021

CLINICAL DATA: Placement of central venous catheter

EXAM:
PORTABLE CHEST 1 VIEW

[chest ap]
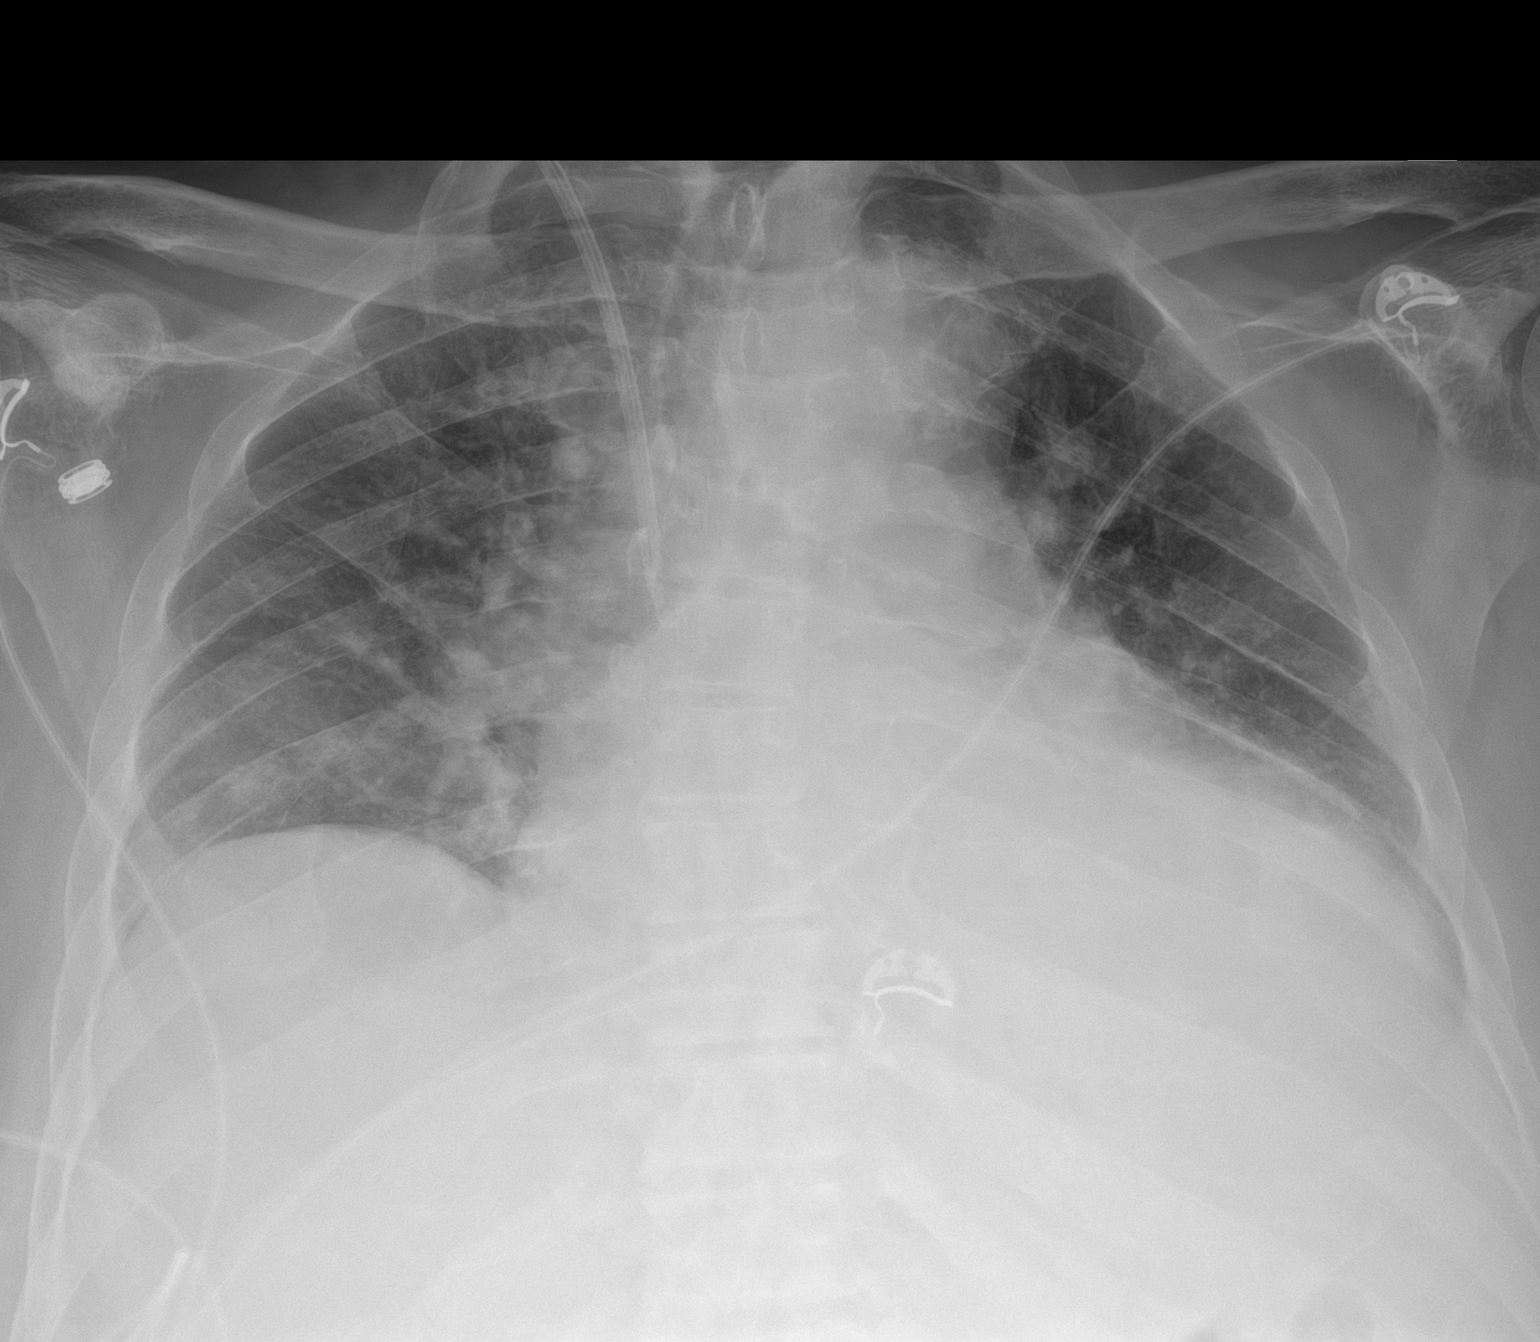

[1 of 1 positions shown; findings below may reference images not displayed]

FINDINGS: Transverse diameter of heart is increased. Central pulmonary vessels
are more prominent. Increased interstitial and alveolar markings are
seen in the parahilar regions and lower lung fields, more so in the
left lower lung fields. There is no pneumothorax. There is interval
placement of large caliber right IJ central venous catheter with its
tip in superior vena cava.
IMPRESSION: Cardiomegaly. Central pulmonary vessels are more prominent
suggesting CHF. Tip of right IJ central venous catheter is seen in
the superior vena cava. There is no pneumothorax.

## 2024-02-01 NOTE — ED Triage Notes (Signed)
 Pt bib [facility] from home. MWF dialysis, dialysis trx yesterday. Pt was taking bandage off and began bleeding from fistula. Lost approx 50ccs of blood. Tourniquet applied by EMS, not actively bleeding. Sudden onset of dizziness while getting on ambulance. Pt is blind  20 LAC 100ccs of Fluid 124 CBG

## 2024-02-01 NOTE — ED Provider Notes (Signed)
 [place] EMERGENCY DEPARTMENT AT [facility] Provider Note   CSN: [id number] Arrival date & time: 02/01/24  1728     Patient presents with: Bleeding Fistula   Austin Wheeler is a 83 y.o. male.   83 yo M with a cc of bleeding from his right AV fistula.  Patient has had trouble off and on.  Thought to be due to his Eliquis .  He says when he is getting his dialysis he knows if it is going to bleed heavily because it hurts.  He said this happened yesterday when he finished dialysis.  Said he had to wait an hour at the dialysis center before they will let him go home to try and get the bleeding to stop.  He had taken the dressing off today and he felt the tape was stuck to his skin which he felt was pulling it and maybe made things worse.  Had reportedly a significant amount of bleeding per EMS.  Patient is blind and was unable to see the exact amount.  He was able to get bleeding controlled with tourniquets and a dressing.        Prior to Admission medications   Medication Sig Start Date End Date Taking? Authorizing Provider  acetaminophen  (TYLENOL ) 500 MG tablet Take 500 mg by mouth every 6 (six) hours as needed (pain.).    [provider]  apixaban  (ELIQUIS ) 2.5 MG TABS tablet Take 1 tablet (2.5 mg total) by mouth 2 (two) times daily. 05/14/21   Dickie Begun, MD  atorvastatin  (LIPITOR) 10 MG tablet Take 1 tablet by mouth once daily 01/05/24   Ladona Heinz, MD  atropine  1 % ophthalmic solution Place 1 drop into the left eye at bedtime. 11/11/19   [provider]  brimonidine  (ALPHAGAN ) 0.2 % ophthalmic solution Place 1 drop into the left eye 3 (three) times daily. 02/22/21   [provider]  clobetasol cream (TEMOVATE) 0.05 % Apply 1 Application topically 2 (two) times daily. 11/09/22   [provider]  dorzolamide -timolol  (COSOPT ) 22.3-6.8 MG/ML ophthalmic solution Place 1 drop into the left eye 2 (two) times daily. 04/11/12   [provider]  dutasteride  (AVODART ) 0.5 MG capsule TAKE 1 CAPSULE BY MOUTH ON MONDAY, WEDNESDAY AND FRIDAY 01/17/24   Stoioff, Glendia BROCKS, MD  latanoprost  (XALATAN ) 0.005 % ophthalmic solution Place 1 drop into the left eye at bedtime. 04/11/12   [provider]  lidocaine -prilocaine  (EMLA ) cream Apply 1 Application topically as needed. 11/23/23     multivitamin (RENA-VIT) TABS tablet Take 1 tablet by mouth daily.    [provider]  Nutritional Supplements (FEEDING SUPPLEMENT, NEPRO CARB STEADY,) LIQD Take 237 mLs by mouth 3 (three) times a week.    [provider]  triamcinolone  ointment (KENALOG ) 0.1 % Apply 1 Application topically 2 (two) times daily as needed (wound care).    [provider]    Allergies: Guaifenesin, Robitussin dm max day-night, and Lexapro [escitalopram]    Review of Systems  Updated Vital Signs BP 92/67   Pulse 88   Temp (!) 97.5 F (36.4 C) (Oral)   Resp 16   SpO2 100%   Physical Exam Vitals and nursing note reviewed.  Constitutional:      Appearance: He is well-developed.  HENT:     Head: Normocephalic and atraumatic.  Eyes:     Pupils: Pupils are equal, round, and reactive to light.  Neck:     Vascular: No JVD.  Cardiovascular:  Rate and Rhythm: Normal rate and regular rhythm.     Heart sounds: No murmur heard.    No friction rub. No gallop.  Pulmonary:     Effort: No respiratory distress.     Breath sounds: No wheezing.  Abdominal:     General: There is no distension.     Tenderness: There is no abdominal tenderness. There is no guarding or rebound.  Musculoskeletal:        General: Normal range of motion.     Cervical back: Normal range of motion and neck supple.  Skin:    Coloration: Skin is not pale.     Findings: No rash.     Comments: Right AV fistula palpable thrill.  No obvious bleeding from his dressing.  Tourniquet to the right upper arm.  Neurological:     Mental Status: He is alert and oriented to person,  place, and time.  Psychiatric:        Behavior: Behavior normal.     (all labs ordered are listed, but only abnormal results are displayed) Labs Reviewed  CBC WITH DIFFERENTIAL/PLATELET - Abnormal; Notable for the following components:      Result Value   RBC 3.48 (*)    Hemoglobin 11.4 (*)    HCT 35.3 (*)    MCV 101.4 (*)    All other components within normal limits  BASIC METABOLIC PANEL WITH GFR - Abnormal; Notable for the following components:   Chloride 92 (*)    Glucose, Bld 112 (*)    BUN 33 (*)    Creatinine, Ser 8.16 (*)    GFR, Estimated 6 (*)    Anion gap 20 (*)    All other components within normal limits    EKG: None  Radiology: No results found.   Procedures   Medications Ordered in the ED - No data to display                                  Medical Decision Making Amount and/or Complexity of Data Reviewed Labs: ordered.   83 yo M with a chief complaints of bleeding from the right AV fistula.  Patient has had trouble with this off and on.  Thought to be due to of his Eliquis .  He says he is currently working with his nephrologist and cardiologist to see if he could be given a decreased dose.  Had significant bleeding after dialysis yesterday.     Bleeding appears to been controlled with EMS with direct pressure and tourniquet placement.   Tourniquet taken down without continued bleeding.  Will keep dressing in place.  Will check hemoglobin metabolic panel here.  Reassess.  Hemoglobin at baseline.  No hyperkalemia.  Patient observed here for about 2 hours without any bleeding through his dressing.  Will discharge home.  Have him hold his night dose of Eliquis .  Keep his dressing on until seen at dialysis.  7:38 PM:  I have discussed the diagnosis/risks/treatment options with the patient and family.  Evaluation and diagnostic testing in the emergency department does not suggest an emergent condition requiring admission or immediate intervention  beyond what has been performed at this time.  They will follow up with PCP, nephrology. We also discussed returning to the ED immediately if new or worsening sx occur. We discussed the sx which are most concerning (e.g., sudden worsening pain, fever, inability to tolerate by mouth) that necessitate immediate  return. Medications administered to the patient during their visit and any new prescriptions provided to the patient are listed below.  Medications given during this visit Medications - No data to display   The patient appears reasonably screen and/or stabilized for discharge and I doubt any other medical condition or other [facility] requiring further screening, evaluation, or treatment in the ED at this time prior to discharge.       Final diagnoses:  Complication of arteriovenous dialysis fistula, initial encounter    ED Discharge Orders     None          Emil Share, DO 02/01/24 1939

## 2024-02-01 NOTE — Discharge Instructions (Signed)
 Please keep your dressing on until you are seen at dialysis tomorrow.

## 2024-02-02 DIAGNOSIS — T829XXA Unspecified complication of cardiac and vascular prosthetic device, implant and graft, initial encounter: Secondary | ICD-10-CM | POA: Diagnosis not present

## 2024-02-14 ENCOUNTER — Other Ambulatory Visit: Payer: Self-pay | Admitting: Urology

## 2024-02-14 DIAGNOSIS — N401 Enlarged prostate with lower urinary tract symptoms: Secondary | ICD-10-CM

## 2024-03-12 ENCOUNTER — Ambulatory Visit: Attending: Vascular Surgery | Admitting: Physician Assistant

## 2024-03-12 VITALS — BP 106/79 | HR 94 | Temp 97.7°F | Wt 237.0 lb

## 2024-03-12 DIAGNOSIS — N186 End stage renal disease: Secondary | ICD-10-CM

## 2024-03-12 DIAGNOSIS — Z992 Dependence on renal dialysis: Secondary | ICD-10-CM | POA: Diagnosis not present

## 2024-03-12 NOTE — Progress Notes (Signed)
 " Office Note     CC:  follow up Requesting Provider:  Macel Jayson PARAS, MD  HPI: Austin Wheeler is a 84 y.o. ([birth year]) male who presents for evaluation of right arm basilic vein fistula.  Fistula was created and October 2023.  Most recently he underwent fistulogram with balloon angioplasty centrally as well as near the anastomosis in August of this year.  He was referred back to clinic for evaluation of scabbing overlying right arm fistula.  He is on Eliquis  for atrial fibrillation.  He denies any prolonged needle or bleeding.  He dialyzes at the [facility] location on a Monday Wednesday Friday schedule.  He is legally blind.   Past Medical History:  Diagnosis Date   Allergic rhinitis    Anemia    Atrial fibrillation (HCC)    Blind    walker and wheelchair   Blindness    BPH (benign prostatic hyperplasia)    Chronic kidney disease    Complication of anesthesia    Retention after eye surgery.  Patient's wife denies this dx as of 12/10/21.   Dependence on renal dialysis    Diabetes mellitus without complication (HCC)    Type II, does not check levels at home.   Dilatation of aorta    Elevated PSA    Enlarged prostate    Gait disorder    GERD (gastroesophageal reflux disease)    Glaucoma    Bilateral   Gout    Heart failure (HCC)    HLD (hyperlipidemia)    Hyperglycemia due to type 2 diabetes mellitus (HCC)    Hypertension    Impaired fasting glucose    Macular degeneration    Bilateral   Malignant tumor of prostate (HCC)    NVG (neovascular glaucoma) 2023   bilateral - followed by Dr Geneva   Obesity    Paroxysmal atrial fibrillation (HCC)    Prostate cancer [facility])    Renal failure    dialysis T-TH-S   Vitamin D deficiency    Walker as ambulation aid    also uses wheelchair    Past Surgical History:  Procedure Laterality Date   A/V FISTULAGRAM N/A 02/01/2023   Procedure: A/V Fistulagram;  Surgeon: Melia Lynwood LELON, MD;  Location: [facility];  Service:  Cardiovascular;  Laterality: N/A;   A/V SHUNT INTERVENTION Right 08/18/2023   Procedure: A/V SHUNT INTERVENTION;  Surgeon: Gretta Lonni PARAS, MD;  Location: [facility];  Service: Cardiovascular;  Laterality: Right;   A/V SHUNT INTERVENTION Right 10/27/2023   Procedure: A/V SHUNT INTERVENTION;  Surgeon: Gretta Lonni PARAS, MD;  Location: [facility];  Service: Cardiovascular;  Laterality: Right;   AV FISTULA PLACEMENT Right 05/13/2021   Procedure: CREATION OF RIGHT ARM BRACHIOCEPHALIC FISTULA;  Surgeon: Sheree Penne Lonni, MD;  Location: [facility] OR;  Service: Vascular;  Laterality: Right;   AV FISTULA PLACEMENT Right 10/15/2021   Procedure: RIGHT ARTERIOVENOUS (AV) FISTULA CREATION VERSUS GRAFT;  Surgeon: Gretta Lonni PARAS, MD;  Location: [facility] OR;  Service: Vascular;  Laterality: Right;   BASCILIC VEIN TRANSPOSITION Right 12/13/2021   Procedure: RIGHT SECOND STAGE BASILIC VEIN TRANSPOSITION;  Surgeon: Gretta Lonni PARAS, MD;  Location: [facility] OR;  Service: Vascular;  Laterality: Right;   EYE SURGERY     Bilateral glaucoma and macular degeneration   FISTULA SUPERFICIALIZATION Right 07/16/2021   Procedure: RIGHT UPPER EXTREMITY FISTULA SUPERFICIALIZATION WITH BRANCH LIGATION;  Surgeon: Sheree Penne Lonni, MD;  Location: [facility] OR;  Service: Vascular;  Laterality: Right;  Block   IR FLUORO GUIDE CV LINE RIGHT  05/03/2021   IR PARACENTESIS  04/23/2021   IR US  GUIDE VASC ACCESS RIGHT  05/03/2021   PERIPHERAL VASCULAR BALLOON ANGIOPLASTY  02/01/2023   Procedure: PERIPHERAL VASCULAR BALLOON ANGIOPLASTY;  Surgeon: Melia Lynwood ORN, MD;  Location: [facility];  Service: Cardiovascular;;   VENOUS ANGIOPLASTY  08/18/2023   Procedure: VENOUS ANGIOPLASTY;  Surgeon: Gretta Lonni PARAS, MD;  Location: [facility];  Service: Cardiovascular;;  innominate and periphereal   VENOUS ANGIOPLASTY  10/27/2023   Procedure: VENOUS ANGIOPLASTY;  Surgeon: Gretta Lonni PARAS, MD;  Location: [facility];  Service:  Cardiovascular;;  Basilic Mid-AVF; Swing Segment; Subclavian    Social History   Socioeconomic History   Marital status: Married    Spouse name: Not on file   Number of children: Not on file   Years of education: Not on file   Highest education level: Not on file  Occupational History   Not on file  Tobacco Use   Smoking status: Former    Current packs/day: 0.00    Average packs/day: 0.3 packs/day    Types: Cigarettes    Quit date: 2019    Years since quitting: 7.0    Passive exposure: Never   Smokeless tobacco: Never  Vaping Use   Vaping status: Never Used  Substance and Sexual Activity   Alcohol use: Not Currently   Drug use: Never   Sexual activity: Yes  Other Topics Concern   Not on file  Social History Narrative   Not on file   Social Drivers of Health   Tobacco Use: Medium Risk (03/12/2024)   Patient History    Smoking Tobacco Use: Former    Smokeless Tobacco Use: Never    Passive Exposure: Never  Physicist, Medical Strain: Not on file  Food Insecurity: Not on file  Transportation Needs: No Transportation Needs (06/24/2021)   PRAPARE - Administrator, Civil Service (Medical): No    Lack of Transportation (Non-Medical): No  Physical Activity: Not on file  Stress: Not on file  Social Connections: Not on file  Intimate Partner Violence: Not on file  Depression (EYV7-0): Not on file  Alcohol Screen: Not on file  Housing: Not on file  Utilities: Not on file  Health Literacy: Not on file   No family history on file.  Current Outpatient Medications  Medication Sig Dispense Refill   ethyl chloride spray Apply 1 spray topically.     acetaminophen  (TYLENOL ) 500 MG tablet Take 500 mg by mouth every 6 (six) hours as needed (pain.).     apixaban  (ELIQUIS ) 2.5 MG TABS tablet Take 1 tablet (2.5 mg total) by mouth 2 (two) times daily. 60 tablet    atorvastatin  (LIPITOR) 10 MG tablet Take 1 tablet by mouth once daily 90 tablet 3   atropine  1 % ophthalmic  solution Place 1 drop into the left eye at bedtime.     brimonidine  (ALPHAGAN ) 0.2 % ophthalmic solution Place 1 drop into the left eye 3 (three) times daily.     clobetasol cream (TEMOVATE) 0.05 % Apply 1 Application topically 2 (two) times daily.     dorzolamide -timolol  (COSOPT ) 22.3-6.8 MG/ML ophthalmic solution Place 1 drop into the left eye 2 (two) times daily.     dutasteride  (AVODART ) 0.5 MG capsule TAKE 1 CAPSULE BY MOUTH ON MONDAY, WEDNESDAY AND FRIDAY 13 capsule 0   latanoprost  (XALATAN ) 0.005 % ophthalmic solution Place 1 drop into the left  eye at bedtime.     lidocaine -prilocaine  (EMLA ) cream Apply 1 Application topically as needed. 60 g 3   multivitamin (RENA-VIT) TABS tablet Take 1 tablet by mouth daily.     Nutritional Supplements (FEEDING SUPPLEMENT, NEPRO CARB STEADY,) LIQD Take 237 mLs by mouth 3 (three) times a week.     triamcinolone  ointment (KENALOG ) 0.1 % Apply 1 Application topically 2 (two) times daily as needed (wound care).     No current facility-administered medications for this visit.    Allergies[1]   REVIEW OF SYSTEMS:  Negative unless noted in HPI [X]  denotes positive finding, [ ]  denotes negative finding Cardiac  Comments:  Chest pain or chest pressure:    Shortness of breath upon exertion:    Short of breath when lying flat:    Irregular heart rhythm:        Vascular    Pain in calf, thigh, or hip brought on by ambulation:    Pain in feet at night that wakes you up from your sleep:     Blood clot in your veins:    Leg swelling:         Pulmonary    Oxygen at home:    Productive cough:     Wheezing:         Neurologic    Sudden weakness in arms or legs:     Sudden numbness in arms or legs:     Sudden onset of difficulty speaking or slurred speech:    Temporary loss of vision in one eye:     Problems with dizziness:         Gastrointestinal    Blood in stool:     Vomited blood:         Genitourinary    Burning when urinating:      Blood in urine:        Psychiatric    Major depression:         Hematologic    Bleeding problems:    Problems with blood clotting too easily:        Skin    Rashes or ulcers:        Constitutional    Fever or chills:      PHYSICAL EXAMINATION:  Vitals:   03/12/24 0828  BP: 106/79  Pulse: 94  Temp: 97.7 F (36.5 C)  TempSrc: Temporal  Weight: 237 lb (107.5 kg)    General:  WDWN in NAD; vital signs documented above Gait: Not observed HENT: WNL, normocephalic Pulmonary: normal non-labored breathing Cardiac: regular HR Abdomen: soft, NT, no masses Skin: without rashes Vascular Exam/Pulses: Palpable right radial pulse Extremities: Palpable thrill in right arm basilic vein fistula; small scabbed areas overlying fistula but no ulceration or areas of inflammation or drainage Musculoskeletal: no muscle wasting or atrophy  Neurologic: A&O X 3 Psychiatric:  The pt has Normal affect.   ASSESSMENT/PLAN:: 84 y.o. 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  He has two small, superficial scabbed areas overlying his fistula. They are not infected and do not represent a risk for impending rupture.  Recommendations include avoidance of scabbed areas when cannulating fistula for dialysis.  I also discussed wound care with the patient which will involve completely soaking the bandage prior to removal to avoid unroofing scabbed areas with bandage removal.  If he develops prolonged needle hole bleeding  in the future he will require repeat fistulogram.  For now he can follow-up on as-needed basis.   Donnice Sender, PA-C [facility] [phone]  Clinic MD:   Gretta     [1]  Allergies Allergen Reactions   Guaifenesin Swelling and Rash    Hand and feet swelling from Robitussin 50 years ago   Robitussin Dm Max Day-Night Other (See Comments)    Other  reaction(s): feet and leg swelling 50 years ago   Lexapro [Escitalopram] Nausea And Vomiting    Reported by [facility] - pt does not recall   "

## 2024-03-13 ENCOUNTER — Ambulatory Visit: Admitting: Podiatry

## 2024-03-18 ENCOUNTER — Other Ambulatory Visit: Payer: Self-pay | Admitting: Urology

## 2024-03-18 ENCOUNTER — Other Ambulatory Visit: Payer: Self-pay

## 2024-03-18 DIAGNOSIS — N401 Enlarged prostate with lower urinary tract symptoms: Secondary | ICD-10-CM

## 2024-03-18 MED ORDER — DUTASTERIDE 0.5 MG PO CAPS: 0.5000 mg | ORAL_CAPSULE | ORAL | 10 refills | Status: AC

## 2024-03-19 ENCOUNTER — Other Ambulatory Visit: Payer: Self-pay

## 2024-03-28 ENCOUNTER — Ambulatory Visit: Admitting: Podiatry

## 2024-03-28 ENCOUNTER — Encounter: Payer: Self-pay | Admitting: Podiatry

## 2024-03-28 DIAGNOSIS — M79675 Pain in left toe(s): Secondary | ICD-10-CM | POA: Diagnosis not present

## 2024-03-28 DIAGNOSIS — E1169 Type 2 diabetes mellitus with other specified complication: Secondary | ICD-10-CM

## 2024-03-28 DIAGNOSIS — M79674 Pain in right toe(s): Secondary | ICD-10-CM

## 2024-03-28 DIAGNOSIS — B351 Tinea unguium: Secondary | ICD-10-CM

## 2024-03-28 NOTE — Progress Notes (Signed)
 This patient presents  to my office for at risk foot care.  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  This patient is taking eliquis .This patient is unable to cut nails himself since the patient cannot reach his nails.These nails are painful walking and wearing shoes. He presents to the office with his wife. This patient presents for at risk foot care today.  General Appearance  Alert, conversant and in no acute stress.  Vascular  Dorsalis pedis and posterior tibial  pulses are  not palpable due to swelling  bilaterally.  Capillary return is within normal limits  bilaterally. Temperature is within normal limits  bilaterally.  Neurologic  Senn-Weinstein monofilament wire test diminished  bilaterally. Muscle power within normal limits bilaterally.  Nails Thick disfigured discolored nails with subungual debris  from hallux to fifth toes bilaterally. No evidence of bacterial infection or drainage bilaterally.  Orthopedic  No limitations of motion  feet .  No crepitus or effusions noted.  No bony pathology or digital deformities noted.  Skin  normotropic skin with no porokeratosis noted bilaterally.  No signs of infections or ulcers noted.     Onychomycosis  Pain in right toes  Pain in left toes  Consent was obtained for treatment procedures.   Mechanical debridement of nails 1-5  bilaterally performed with a nail nipper.  Filed with dremel without incident.    Return office visit  4    months                  Told patient to return for periodic foot care and evaluation due to potential at risk complications.   Ruffin Cotton DPM

## 2024-12-27 ENCOUNTER — Other Ambulatory Visit

## 2024-12-31 ENCOUNTER — Ambulatory Visit: Admitting: Urology
